# Patient Record
Sex: Male | Born: 1945 | Race: White | Hispanic: No | Smoking: Former smoker
Health system: Southern US, Community
[De-identification: ages and names within clinical notes are randomized; demographics above are authoritative.]

## PROBLEM LIST (undated history)

## (undated) DIAGNOSIS — R251 Tremor, unspecified: Secondary | ICD-10-CM

## (undated) DIAGNOSIS — C801 Malignant (primary) neoplasm, unspecified: Secondary | ICD-10-CM

## (undated) DIAGNOSIS — M1990 Unspecified osteoarthritis, unspecified site: Secondary | ICD-10-CM

## (undated) DIAGNOSIS — R0600 Dyspnea, unspecified: Secondary | ICD-10-CM

## (undated) DIAGNOSIS — I2510 Atherosclerotic heart disease of native coronary artery without angina pectoris: Secondary | ICD-10-CM

## (undated) DIAGNOSIS — I499 Cardiac arrhythmia, unspecified: Secondary | ICD-10-CM

## (undated) DIAGNOSIS — I10 Essential (primary) hypertension: Secondary | ICD-10-CM

## (undated) DIAGNOSIS — I779 Disorder of arteries and arterioles, unspecified: Secondary | ICD-10-CM

## (undated) DIAGNOSIS — I4891 Unspecified atrial fibrillation: Secondary | ICD-10-CM

## (undated) DIAGNOSIS — I209 Angina pectoris, unspecified: Secondary | ICD-10-CM

## (undated) DIAGNOSIS — N189 Chronic kidney disease, unspecified: Secondary | ICD-10-CM

## (undated) DIAGNOSIS — I491 Atrial premature depolarization: Secondary | ICD-10-CM

## (undated) DIAGNOSIS — I739 Peripheral vascular disease, unspecified: Secondary | ICD-10-CM

## (undated) DIAGNOSIS — Z950 Presence of cardiac pacemaker: Secondary | ICD-10-CM

## (undated) HISTORY — DX: Unspecified atrial fibrillation: I48.91

## (undated) HISTORY — PX: CATARACT EXTRACTION W/ INTRAOCULAR LENS IMPLANT: SHX1309

## (undated) HISTORY — DX: Disorder of arteries and arterioles, unspecified: I77.9

## (undated) HISTORY — DX: Atrial premature depolarization: I49.1

## (undated) HISTORY — DX: Essential (primary) hypertension: I10

## (undated) HISTORY — PX: BACK SURGERY: SHX140

## (undated) HISTORY — DX: Atherosclerotic heart disease of native coronary artery without angina pectoris: I25.10

## (undated) HISTORY — DX: Tremor, unspecified: R25.1

## (undated) HISTORY — DX: Peripheral vascular disease, unspecified: I73.9

## (undated) HISTORY — PX: ARM WOUND REPAIR / CLOSURE: SUR1141

---

## 2004-02-28 HISTORY — PX: CORONARY ARTERY BYPASS GRAFT: SHX141

## 2007-05-31 ENCOUNTER — Ambulatory Visit: Payer: Self-pay | Admitting: Cardiology

## 2007-07-23 ENCOUNTER — Ambulatory Visit: Payer: Self-pay | Admitting: Cardiology

## 2007-10-09 ENCOUNTER — Encounter: Payer: Self-pay | Admitting: Cardiology

## 2008-01-29 ENCOUNTER — Encounter: Payer: Self-pay | Admitting: Cardiology

## 2009-03-03 ENCOUNTER — Telehealth (INDEPENDENT_AMBULATORY_CARE_PROVIDER_SITE_OTHER): Payer: Self-pay | Admitting: *Deleted

## 2009-03-24 ENCOUNTER — Encounter: Payer: Self-pay | Admitting: Cardiology

## 2009-03-25 ENCOUNTER — Encounter: Payer: Self-pay | Admitting: Cardiology

## 2009-03-26 ENCOUNTER — Ambulatory Visit: Payer: Self-pay | Admitting: Cardiology

## 2010-03-29 NOTE — Progress Notes (Signed)
Summary: Request for lab order  Phone Note Call from Patient Call back at Home Phone [phone]   Caller: Spouse Call For: nurse Summary of Call: wife called requesting a lab order be sent for bloodwork prior to appt on 28th. Nurse called her back to inform her that there was no mention of labs in last note and if MD wanted labs/test that it would be ordered at the time of his visit but no answer and unable to leave message,phone just rang. Initial call taken by: Carlye Grippe,  March 03, 2009 12:02 PM  Follow-up for Phone Call        Patient informed of the above via messge machine after several attempts to reach patient.  Follow-up by: Carlye Grippe,  March 04, 2009 4:11 PM

## 2010-03-29 NOTE — Medication Information (Signed)
Summary: RX Folder/ LIPITOR  RX Folder/ LIPITOR   Imported By: Dorise Hiss 03/24/2009 10:58:05  _____________________________________________________________________  External Attachment:    Type:   Image     Comment:   External Document

## 2010-03-29 NOTE — Miscellaneous (Signed)
  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 [facility]... LIMA LAD, SVG to PDA, SVG to OM1, SVG to diagonal one CAD (ICD-414.00) HYPERTENSION (ICD-401.9) EF 50%... echo... 2006.[place] Atrial fibrillation.... post CABG.... amiodarone.... resolved  (03/25/2009 13:02)       Past History:  Past Medical History:  * TREMOR CORONARY ARTERY BYPASS GRAFT, HX OF (ICD-V45.81)...2006.... [facility]... LIMA LAD, SVG to PDA, SVG to OM1, SVG to diagonal one CAD (ICD-414.00) HYPERTENSION (ICD-401.9) EF 50%... echo... 2006.[place]land [place] Atrial fibrillation.... post CABG.... amiodarone.... resolved

## 2010-03-29 NOTE — Assessment & Plan Note (Signed)
Summary: EST-LAST SEEN 05/09 CALLED TO MAKE APPT   Visit Type:  Follow-up Primary Provider:  Dr. Lorelei Pont  CC:  CAD.  History of Present Illness: The patient is seen for followup of coronary artery disease.  He is also seen for PACs.  He seen also for hypertension.  I saw the patient last May, 2009. patient has a history of CABG at [facility] in 2006.  He has done well since then.  He is not having chest pain.  He is fully active without shortness of breath.  There has been recent question of "extra beats."  He does not feel palpitations.  Preventive Screening-Counseling & Management  Alcohol-Tobacco     Smoking Status: quit > 6 months  Comments: Smoked for about 30 yrs and quit about 25-30 yrs ago.  Chewed tobacco for about 6-7 yrs ago. Quit about 25-30 yrs ago.  Current Medications (verified): 1)  Celebrex 200 Mg Caps (Celecoxib) .... Take 1 Capsule By Mouth Once A Day 2)  Lipitor 40 Mg Tabs (Atorvastatin Calcium) .... Take 1 Tablet By Mouth At Bedtime 3)  Metoprolol Tartrate 25 Mg Tabs (Metoprolol Tartrate) .... Take 1/2 Tablet By Mouth Twice A Day 4)  Plavix 75 Mg Tabs (Clopidogrel Bisulfate) .... Take 1 Tablet By Mouth Once A Day 5)  Multivitamins  Tabs (Multiple Vitamin) .... Take 1 Tablet By Mouth Once A Day 6)  Fish Oil 1000 Mg Caps (Omega-3 Fatty Acids) .... Take 1 Capsule By Mouth Two Times A Day 7)  Flaxseed Oil 1200 Mg Caps (Flaxseed (Linseed)) .... Take 1 Capsule By Mouth Once A Day 8)  Niacin 500 Mg Tabs (Niacin) .... Take 1 Tablet By Mouth Two Times A Day  Allergies (verified): 1)  * Niaspan  Comments:  Nurse/Medical Assistant: The patient's medications were reviewed with the patient and were updated in the Medication List. Pt brought a list of medications to office visit.  Cyril Loosen, RN, BSN (March 26, 2009 9:12 AM)  Past History:  Past Medical History:  * TREMOR CORONARY ARTERY BYPASS GRAFT, HX OF (ICD-V45.81)...2006.... [facility]... LIMA LAD, SVG to PDA,  SVG to OM1, SVG to diagonal one CAD (ICD-414.00) HYPERTENSION (ICD-401.9) EF 50%... echo... 2006.[place] Atrial fibrillation.... post CABG.... amiodarone.... resolved PACs... EKG... January, 2011  Social History: Smoking Status:  quit > 6 months  Review of Systems       The patient denies fever, chills, rash, sweats, headache, nausea, vomiting, urinary symptoms, change in vision, change in hearing, chest pain, cough, shortness of breath.  All other systems are reviewed and are negative  Vital Signs:  Patient profile:   65 year old male Height:      71 inches Weight:      217.75 pounds BMI:     30.48 Pulse rate:   58 / minute BP sitting:   165 / 108  (left arm) Cuff size:   regular  Vitals Entered By: Cyril Loosen, RN, BSN (March 26, 2009 9:06 AM)  Nutrition Counseling: Patient's BMI is greater than 25 and therefore counseled on weight management options. CC: CAD Comments Follow up visit. No cardiac complaints.   Physical Exam  General:  patient is stable today. Head:  head is atraumatic. Eyes:  no xanthelasma. Neck:  no jugular venous distention. Chest Wall:  no chest wall tenderness. Lungs:  lungs are clear.  Respiratory effort is not labored. Heart:  cardiac exam reveals an S1 and S2.  There no clicks or significant murmurs. Abdomen:  abdomen is soft.  Msk:  no musculoskeletal deformities. Extremities:  no peripheral edema. Neurologic:  by history the patient has an intention tremor.  I have not seen it today.  There is no rest tremor. Skin:  no skin rashes. Psych:  patient is oriented to person time and place.  Affect is normal.  He is here with his wife today.   Impression & Recommendations:  Problem # 1:  * TREMOR Clinically the patient's tremor has not changed.  No further workup.  Problem # 2:  CAD (ICD-414.00)  His updated medication list for this problem includes:    Metoprolol Tartrate 25 Mg Tabs (Metoprolol tartrate) .Marland Kitchen... Take 1/2 tablet by  mouth twice a day    Plavix 75 Mg Tabs (Clopidogrel bisulfate) .Marland Kitchen... Take 1 tablet by mouth once a day  Orders: EKG w/ Interpretation (93000)  EKG is done today and reviewed by me.  The QRS is normal.  He has normal sinus rhythm.  There are PACs noted. The patient has been on aspirin and Plavix since the time of his surgery.  He no longer needs Plavix.  It will be stopped after his current prescription.  I will see him for cardiology followup in one year.  Problem # 3:  HYPERTENSION (ICD-401.9)  His updated medication list for this problem includes:    Metoprolol Tartrate 25 Mg Tabs (Metoprolol tartrate) .Marland Kitchen... Take 1/2 tablet by mouth twice a day  Blood pressure is elevated today.  I discussed this with the patient and his wife.  They will begin to use the home blood pressure cuff and record several values.  The patient will then see his primary physician in the next 2 weeks for comparison of home blood pressures and review of whether or not other additional meds are needed.  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 Take 1/2 tablet by mouth twice a day    Plavix 75 Mg Tabs (Clopidogrel bisulfate) .Marland Kitchen... Take 1 tablet by mouth once a day  there is question of "extra beats."  EKG reveals PACs.  These are benign.  No further workup is needed or in  Patient Instructions: 1)  Your physician has recommended you make the following change in your medication: STOP PLAVIX after finishing your current supply. 2)  Your physician recommends that you monitor your BP at home and see your primary care doctor R/E your BP.  3)  Your refills have been sent to [facility]. 4)  Your physician wants you to follow-up in: 1Year.  You will receive a reminder letter in the mail about two months in advance. If you don't receive a letter, please call our office to schedule the follow-up  appointment. Prescriptions: METOPROLOL TARTRATE 25 MG TABS (METOPROLOL TARTRATE) Take 1/2 tablet by mouth twice a day  #90 x 3   Entered by:   Carlye Grippe   Authorized by:   Talitha Givens, MD, [facility]   Signed by:   Carlye Grippe on 03/26/2009   Method used:   Electronically to        [facility] ([facility]order)       [address]       [place], [place]  [address]       Ph: [phone]       Fax: [phone]   RxID:   [id number] LIPITOR 40 MG TABS (ATORVASTATIN CALCIUM) Take 1 tablet by mouth at  bedtime  #90 x 3   Entered by:   Carlye Grippe   Authorized by:   Talitha Givens, MD, [facility]   Signed by:   Carlye Grippe on 03/26/2009   Method used:   Electronically to        [facility] (mail[facility]order)       [address]       [place], [place]  [address]       Ph: [phone]       Fax: [phone]   RxID:   [id number]

## 2010-05-02 ENCOUNTER — Ambulatory Visit (INDEPENDENT_AMBULATORY_CARE_PROVIDER_SITE_OTHER): Payer: PRIVATE HEALTH INSURANCE | Admitting: Cardiology

## 2010-05-02 ENCOUNTER — Encounter: Payer: Self-pay | Admitting: Cardiology

## 2010-05-02 DIAGNOSIS — I2510 Atherosclerotic heart disease of native coronary artery without angina pectoris: Secondary | ICD-10-CM

## 2010-05-10 NOTE — Assessment & Plan Note (Signed)
Summary: 1 YR F/U-RECV LETTER/VS/TR   Visit Type:  Follow-up Primary Provider:  Dr. Lorelei Pont  CC:  CAD.  History of Present Illness: patient is seen for followup of coronary artery disease.  I saw him last January, 2011.  He is quite active.  He is not having any significant chest pain or palpitations.  He underwent CABG in 2006.  He has not had any exercise testing.  He is very active and has no symptoms.  Preventive Screening-Counseling & Management  Alcohol-Tobacco     Smoking Status: quit     Year Quit: 2000  Allergies: 1)  * Niaspan  Past History:  Past Medical History:  * TREMOR CORONARY ARTERY BYPASS GRAFT, HX OF (ICD-V45.81)...2006.... [facility]... LIMA LAD, SVG to PDA, SVG to OM1, SVG to diagonal one CAD (ICD-414.00) HYPERTENSION (ICD-401.9) EF 50%... echo... 2006.[place] Atrial fibrillation.... post CABG.... amiodarone.... resolved PACs... EKG... January, 2011.Marland Kitchen  Social History: Smoking Status:  quit  Review of Systems       Patient denies fever, chills, headache, sweats, rash, change in vision, change in hearing, chest pain, cough, nausea vomiting, urinary symptoms.  All other systems are reviewed and are negative.  Vital Signs:  Patient profile:   65 year old male Height:      71 inches Weight:      215 pounds BMI:     30.09 Pulse rate:   74 / minute BP sitting:   148 / 85  (left arm) Cuff size:   large  Vitals Entered By: Carlye Grippe (May 02, 2010 12:59 PM)  Nutrition Counseling: Patient's BMI is greater than 25 and therefore counseled on weight management options.  Physical Exam  General:  patient is stable. Eyes:  no xanthelasma. Neck:  no jugular venous distention. Lungs:  lungs are clear.  Respiratory effort is not labored. Heart:  cardiac exam reveals S1 and S2.  No clicks or significant murmurs. Abdomen:  abdomen soft. Msk:  no musculoskeletal deformities. Extremities:  no peripheral edema. Psych:  patient is oriented to person  time and place.  Affect is normal.   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  He had atrial fibr at time of his CABG.  He has not had recurrence. EKG is done today and reviewed by me.  They're sinus rhythm.  No significant abnormality.  Problem # 2:  CAD (ICD-414.00) Coronary disease is stable.  No further workup is needed.  Problem # 3:  HYPERTENSION (ICD-401.9) Blood pressure is controlled.  No change in therapy.  Plan one year followup  Other Orders: EKG w/ Interpretation (93000)  Patient Instructions: 1)  Your physician wants you to follow-up in: 1 year. You will receive a reminder letter in the mail one-two months in advance. If you don't receive a letter, please call our office to schedule the follow-up appointment. 2)  Your physician recommends that you continue on your current medications as directed. Please refer to the Current Medication list given to you today. Prescriptions: METOPROLOL TARTRATE 25 MG TABS (METOPROLOL TARTRATE) Take 1/2 tablet by mouth twice a day  #90 x 3   Entered by:   Cyril Loosen, RN, BSN   Authorized by:   Talitha Givens, MD, [facility]   Signed by:   Cyril Loosen, RN, BSN on 05/02/2010   Method used:   Electronically to        [facility]* (mail-order)  [address]       [place], [place]  [address]       Ph: [phone]       Fax: [phone]   RxID:   [id number] LIPITOR 40 MG TABS (ATORVASTATIN CALCIUM) Take 1 tablet by mouth at bedtime  #90 x 3   Entered by:   Cyril Loosen, RN, BSN   Authorized by:   Talitha Givens, MD, [facility]   Signed by:   Cyril Loosen, RN, BSN on 05/02/2010   Method used:   Electronically to        [facility]* (mail-order)       [address]       [place], [place]  [address]       Ph: [phone]       Fax: [phone]   RxID:   [id number]

## 2010-07-12 NOTE — Assessment & Plan Note (Signed)
[facility]                          [facility]ICE NOTE   WACO, FOERSTER                      MRN:          [id number]  DATE:05/31/2007                            DOB:          10-29-1945    [name] is from [place], and needs an ongoing cardiologist,  and is here for cardiac evaluation.  He is here with his wife today.  He  is a very pleasant gentleman.  He is post CABG in 2006.  This was done  at [facility].  At that time, he underwent CABG x4 with a LIMA to the LAD, SVG  to the PDA, SVG to the OM1 and SVG to diagonal 1.  He has done well  since then.  He was seen in August 2006 in [place] by Dr. Graciela Hatfield.  There was a stress Cardiolite.  There was no ischemia.  Ejection  fraction was in the 60s.  The patient also had a 2-D echo.  Ejection  fraction was in the 50s.  There was question of some dilatation of the  right ventricle.  There was concentric LVH.   Overall he was stable at that time.  At this time he is not having any  significant chest pain.  He goes about full activities.  He is quite  active including riding his bicycle with his grandchildren.  He is doing  very well.  There is question that his blood pressure may be mildly  elevated.   PAST MEDICAL HISTORY:   ALLERGIES:  No known drug allergies.   MEDICATIONS:  1. Fish oil b.i.d.  2. Niacin 500 mg multivitamin.  3. Aspirin 325.  4. Plavix 75.  5. Lipitor 40.  6. Metoprolol 25.   At this point I do not have any documentation for the exact need for  ongoing Plavix and we will reconsider this.   SOCIAL HISTORY:  The patient lives in [place] and is active.  He  quit smoking years ago.   FAMILY HISTORY:  The family history is noncontributory at this time.   REVIEW OF SYSTEMS:  He has no significant complaints.  He has some  arthritis and otherwise his review of systems is negative.   PHYSICAL EXAM:  Blood pressure is 150/87.  Pulse of 76.  The  patient is oriented to person, time and place.  Affect is normal.  HEENT:  Reveals no xanthelasma.  He has normal extraocular motion.  There are no carotid bruits.  There is no jugular venous distention.  Lungs are clear.  Respiratory effort is not labored.  Cardiac exam reveals S1-S2.  There are no clicks or significant murmurs.  The abdomen is soft.  There are no masses or bruits.  He has no  significant peripheral edema.  There are no musculoskeletal deformities.  He has a lesion on the right side of his face status post a skin cancer  removed by Dr. Orvan Hatfield recently.   EKG shows no significant abnormalities.   PROBLEMS:  1. Hypertension.  2. Hypercholesterolemia.  3. History of coronary disease post CABG in 2006.  4. History of  normal LV function.  5. History of some type of tremor.  There is question of Parkinson's      disease but I believe that this is not documented.  He has seen a      neurologist at some point and was told it was some type of tremor.      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  6. Postoperative atrial fibrillation with amiodarone that was stopped      years ago.   The patient will have labs including CBC, TSH be met and a fasting lipid  with liver.  These of the only studies of I will range at this time.  I  will then see him back and I will consider other blood pressure meds.  Will also consider a longevity of his Plavix.  We will also consider  whether or not he needs any other testing.   He is quite stable.  If he finds a new primary physician here in town,  we will send all records.     William Abed, MD, [facility]  Electronically Signed    JDK/MedQ  DD: 05/31/2007  DT: 05/31/2007  Job #: [id number]

## 2010-07-12 NOTE — Assessment & Plan Note (Signed)
[facility]                          [facility] NOTE   William Hatfield, William Hatfield                      MRN:          [id number]  DATE:07/23/2007                            DOB:          [birth year]    [name] cardiac status is stable at this point.  See my note of May 31, 2007.  He established as a new patient.  He underwent cabbage in  2006.  He is not having any chest pain.  We had checked some labs.  His  HDL is quite low.  His niacin had been increased from 500 to 1 gram but  I now understand that this is an over-the-counter preparation and we  will change him to Niaspan.   He is not having any significant chest pain.  He is having significant  arthritic pain and he and his wife ask what medications he might be able  to use.   PAST MEDICAL HISTORY:   ALLERGIES:  No known drug allergies.   MEDICATIONS:  Fish oil, multivitamin, Plavix 75, Lipitor 40, metoprolol  25, aspirin 81, and over-the-counter niacin (to be changed to Niaspan).   OTHER MEDICAL PROBLEMS:  See the list on my note of May 31, 2007.   REVIEW OF SYSTEMS:  Other than his arthritis, his review of systems is  negative.   PHYSICAL EXAM:  Blood pressure is 150/87.  We will await look at his  blood pressure again when I see him back.  Weight is 215 pounds.  He is here with his wife.  HEENT:  Reveals no xanthelasma.  He has normal extraocular motion.  There are no carotid bruits.  There is no jugular venous distention.  Lungs are clear.  Respiratory effort is not labored.  Cardiac exam reveals an S1 with an S2.  There are no clicks or  significant murmurs.  The abdomen is soft.  He has no peripheral edema.   Problems are listed on the note of May 31, 2007.  The patient needs  continued followup of his blood pressure.  He may need further  medications.  We need to push Niaspan in an attempt to raise his HDL.  His LDL is good at 73.  He is stable.  We will adjust  these medicines.  I have encouraged him to use Celebrex at the 200 mg dose for his  arthritis.  This is, in my opinion, the  safest medicine that can be used at this time related to cardiac  disease.  I will see him in early followup.     Luis Abed, MD, [facility]  Electronically Signed    JDK/MedQ  DD: 07/23/2007  DT: 07/23/2007  Job #: [id number]   cc:   Dr. Leary Roca

## 2011-05-05 ENCOUNTER — Encounter: Payer: Self-pay | Admitting: *Deleted

## 2011-05-10 ENCOUNTER — Ambulatory Visit: Payer: PRIVATE HEALTH INSURANCE | Admitting: Cardiology

## 2011-05-18 ENCOUNTER — Ambulatory Visit: Payer: PRIVATE HEALTH INSURANCE | Admitting: Cardiology

## 2011-05-19 ENCOUNTER — Other Ambulatory Visit: Payer: Self-pay | Admitting: *Deleted

## 2011-05-19 MED ORDER — METOPROLOL TARTRATE 25 MG PO TABS: 12.5000 mg | ORAL_TABLET | Freq: Two times a day (BID) | ORAL | Status: DC

## 2011-06-16 ENCOUNTER — Ambulatory Visit: Payer: PRIVATE HEALTH INSURANCE | Admitting: Cardiology

## 2011-06-23 ENCOUNTER — Encounter: Payer: Self-pay | Admitting: Cardiology

## 2011-06-23 DIAGNOSIS — Z951 Presence of aortocoronary bypass graft: Secondary | ICD-10-CM | POA: Insufficient documentation

## 2011-06-23 DIAGNOSIS — R251 Tremor, unspecified: Secondary | ICD-10-CM | POA: Insufficient documentation

## 2011-06-23 DIAGNOSIS — I2510 Atherosclerotic heart disease of native coronary artery without angina pectoris: Secondary | ICD-10-CM | POA: Insufficient documentation

## 2011-06-23 DIAGNOSIS — I10 Essential (primary) hypertension: Secondary | ICD-10-CM | POA: Insufficient documentation

## 2011-06-23 DIAGNOSIS — IMO0002 Reserved for concepts with insufficient information to code with codable children: Secondary | ICD-10-CM | POA: Insufficient documentation

## 2011-06-23 DIAGNOSIS — I491 Atrial premature depolarization: Secondary | ICD-10-CM | POA: Insufficient documentation

## 2011-06-23 DIAGNOSIS — R9430 Abnormal result of cardiovascular function study, unspecified: Secondary | ICD-10-CM | POA: Insufficient documentation

## 2011-06-26 ENCOUNTER — Ambulatory Visit (INDEPENDENT_AMBULATORY_CARE_PROVIDER_SITE_OTHER): Payer: PRIVATE HEALTH INSURANCE | Admitting: Cardiology

## 2011-06-26 ENCOUNTER — Other Ambulatory Visit: Payer: Self-pay | Admitting: *Deleted

## 2011-06-26 ENCOUNTER — Encounter: Payer: Self-pay | Admitting: Cardiology

## 2011-06-26 VITALS — BP 151/76 | HR 62 | Ht 71.0 in | Wt 215.0 lb

## 2011-06-26 DIAGNOSIS — I10 Essential (primary) hypertension: Secondary | ICD-10-CM

## 2011-06-26 DIAGNOSIS — I4891 Unspecified atrial fibrillation: Secondary | ICD-10-CM

## 2011-06-26 DIAGNOSIS — I2510 Atherosclerotic heart disease of native coronary artery without angina pectoris: Secondary | ICD-10-CM

## 2011-06-26 DIAGNOSIS — R0989 Other specified symptoms and signs involving the circulatory and respiratory systems: Secondary | ICD-10-CM

## 2011-06-26 DIAGNOSIS — I491 Atrial premature depolarization: Secondary | ICD-10-CM

## 2011-06-26 MED ORDER — LISINOPRIL 40 MG PO TABS: 40.0000 mg | ORAL_TABLET | Freq: Every day | ORAL | Status: DC

## 2011-06-26 MED ORDER — METOPROLOL TARTRATE 25 MG PO TABS: 12.5000 mg | ORAL_TABLET | Freq: Two times a day (BID) | ORAL | Status: DC

## 2011-06-26 MED ORDER — ATORVASTATIN CALCIUM 40 MG PO TABS: 40.0000 mg | ORAL_TABLET | Freq: Every day | ORAL | Status: DC

## 2011-06-26 NOTE — Assessment & Plan Note (Signed)
His systolic blood pressure is mildly increased today. At home he says that his blood pressure tends to run in the high 140s. He is on blood pressure medications. I suspect we really should an additional medication. He will be seeing his primary physician in another week or 2. I will send a copy of my current note to him. At that time they can decide if another medication should be adequate for his blood pressure.

## 2011-06-26 NOTE — Assessment & Plan Note (Signed)
He is not having any significant palpitations. No further workup. 

## 2011-06-26 NOTE — Assessment & Plan Note (Signed)
Coronary disease is stable. He underwent CABG in 2006. He is very active and has absolutely no symptoms. He does note he has not had any exercise test in many years I feel it is not necessary at this time.

## 2011-06-26 NOTE — Progress Notes (Signed)
HPI Patient returns for followup of coronary artery disease. He is doing very well. He underwent CABG in 2006. He is very active. He mows and works in his garden and has absolutely no symptoms. He is on appropriate medications. I saw him last one year ago.  As part of today's evaluation I have completely reviewed the old records. I have updated the current new electronic medical record.   Allergies  Allergen Reactions  . Niaspan (Niacin) Hives    NIASPAN ONLY/REACTION: Itching, whelps    Current Outpatient Prescriptions  Medication Sig Dispense Refill  . aspirin EC 81 MG tablet Take 162 mg by mouth daily.      Marland Kitchen atorvastatin (LIPITOR) 40 MG tablet Take 40 mg by mouth daily.      . Cinnamon 500 MG TABS Take 1 tablet by mouth daily.      . fish oil-omega-3 fatty acids 1000 MG capsule Take 1 g by mouth daily.       . Flaxseed, Linseed, (FLAXSEED OIL) 1200 MG CAPS Take 1 capsule by mouth daily.      Marland Kitchen lisinopril (PRINIVIL,ZESTRIL) 40 MG tablet Take 40 mg by mouth daily.      . metoprolol tartrate (LOPRESSOR) 25 MG tablet Take 0.5 tablets (12.5 mg total) by mouth 2 (two) times daily.  90 tablet  0  . Multiple Vitamin (MULTIVITAMIN) tablet Take 1 tablet by mouth daily.      . niacin 500 MG tablet Take 500 mg by mouth daily with breakfast.       . vitamin C (ASCORBIC ACID) 500 MG tablet Take 500 mg by mouth daily.        History   Social History  . Marital Status: Married    Spouse Name: N/A    Number of Children: N/A  . Years of Education: N/A   Occupational History  . Not on file.   Social History Main Topics  . Smoking status: Former Smoker -- 4.0 packs/day for 20 years    Types: Cigarettes    Quit date: 02/28/1979  . Smokeless tobacco: Former Neurosurgeon    Types: Snuff, Chew    Quit date: 02/27/1981  . Alcohol Use: Not on file  . Drug Use: Not on file  . Sexually Active: Not on file   Other Topics Concern  . Not on file   Social History Narrative  . No narrative on file     No family history on file.  Past Medical History  Diagnosis Date  . Tremor   . Hypertension   . Atrial fibrillation     post CABG.... amiodarone.... resolved  . PAC (premature atrial contraction)     PACs... EKG... January, 2011..  . CAD (coronary artery disease)   . Hx of CABG     2006, [facility], LIMA to LAD, SVG to PDA, SVG to OM1, SVG to diagonal 1  . Ejection fraction     EF 50%, echo, 2006    Past Surgical History  Procedure Date  . Coronary artery bypass graft 2006    [facility]    ROS  Patient denies fever, chills, headache, sweats, rash, change in vision, change in hearing, chest pain, cough, nausea vomiting, urinary symptoms. All other systems are reviewed and are negative.  PHYSICAL EXAM  Patient is here with his wife. He is oriented to person time and place. Affect is normal. There is no jugulovenous distention. There may be a soft left carotid bruit. Lungs are clear. Respiratory effort is  nonlabored. Cardiac exam reveals S1 and S2. There no clicks or significant murmurs. The abdomen is soft. There is no peripheral edema. There no musculoskeletal deformities. There are no skin rashes.  Filed Vitals:   06/26/11 1318  BP: 151/76  Pulse: 62  Height: 5\' 11"  (1.803 m)  Weight: 215 lb (97.523 kg)   EKG is done today and reviewed by me. There is no significant abnormality.  ASSESSMENT & PLAN

## 2011-06-26 NOTE — Patient Instructions (Signed)
Your physician you to follow up in 1 year. You will receive a reminder letter in the mail one-two months in advance. If you don't receive a letter, please call our office to schedule the follow-up appointment. Your physician recommends that you continue on your current medications as directed. Please refer to the Current Medication list given to you today. Your physician has requested that you have a carotid duplex. This test is an ultrasound of the carotid arteries in your neck. It looks at blood flow through these arteries that supply the brain with blood. Allow one hour for this exam. There are no restrictions or special instructions. If the results of your test are normal or stable, you will receive a letter. If they are abnormal, the nurse will contact you by phone.  

## 2011-06-26 NOTE — Assessment & Plan Note (Signed)
There is question of a soft left carotid bruit. The patient has not had a carotid Doppler since his surgery in 2006. We will schedule him for a carotid Doppler. Overall I will plan to see him back in one year.

## 2011-07-12 ENCOUNTER — Encounter (INDEPENDENT_AMBULATORY_CARE_PROVIDER_SITE_OTHER): Payer: PRIVATE HEALTH INSURANCE

## 2011-07-12 DIAGNOSIS — I6529 Occlusion and stenosis of unspecified carotid artery: Secondary | ICD-10-CM

## 2011-07-12 DIAGNOSIS — R0989 Other specified symptoms and signs involving the circulatory and respiratory systems: Secondary | ICD-10-CM

## 2011-07-18 ENCOUNTER — Encounter: Payer: Self-pay | Admitting: Cardiology

## 2011-07-18 DIAGNOSIS — I779 Disorder of arteries and arterioles, unspecified: Secondary | ICD-10-CM | POA: Insufficient documentation

## 2011-07-18 DIAGNOSIS — I739 Peripheral vascular disease, unspecified: Secondary | ICD-10-CM

## 2011-07-19 ENCOUNTER — Telehealth: Payer: Self-pay | Admitting: *Deleted

## 2011-07-19 NOTE — Telephone Encounter (Signed)
Left message with patients wife to call office. 

## 2011-07-19 NOTE — Telephone Encounter (Signed)
Message copied by Eustace Moore on Wed Jul 19, 2011 10:41 AM ------      Message from: [place], [place] D      Created: Tue Jul 18, 2011  1:42 PM       Please call to be sure the patient knows that the Doppler does show moderate narrowing in his carotid arteries. I have explained to him that we wanted to do the study to be sure that we were following any narrowing that he has. It will be safe to follow as long as he comes in for the followup Dopplers.

## 2011-07-19 NOTE — Telephone Encounter (Signed)
Message copied by Eustace Moore on Wed Jul 19, 2011 10:48 AM ------      Message from: [place], [place] D      Created: Tue Jul 18, 2011  1:42 PM       Please call to be sure the patient knows that the Doppler does show moderate narrowing in his carotid arteries. I have explained to him that we wanted to do the study to be sure that we were following any narrowing that he has. It will be safe to follow as long as he comes in for the followup Dopplers.

## 2011-07-19 NOTE — Telephone Encounter (Signed)
Patient informed. 

## 2012-02-22 ENCOUNTER — Encounter (INDEPENDENT_AMBULATORY_CARE_PROVIDER_SITE_OTHER): Payer: PRIVATE HEALTH INSURANCE

## 2012-02-22 DIAGNOSIS — I6529 Occlusion and stenosis of unspecified carotid artery: Secondary | ICD-10-CM

## 2012-03-03 ENCOUNTER — Encounter: Payer: Self-pay | Admitting: Cardiology

## 2012-03-05 ENCOUNTER — Telehealth: Payer: Self-pay | Admitting: *Deleted

## 2012-03-05 NOTE — Telephone Encounter (Signed)
Wife informed

## 2012-03-05 NOTE — Telephone Encounter (Signed)
Message copied by Eustace Moore on Tue Mar 05, 2012  1:24 PM ------      Message from: [place], [place] D      Created: Sun Mar 03, 2012  6:32 PM       Please let him know that his carotid Doppler is stable. Continued close followup is important

## 2012-04-30 ENCOUNTER — Other Ambulatory Visit: Payer: Self-pay | Admitting: Cardiology

## 2012-04-30 MED ORDER — LISINOPRIL 40 MG PO TABS: 40.0000 mg | ORAL_TABLET | Freq: Every day | ORAL | Status: DC

## 2012-05-13 ENCOUNTER — Other Ambulatory Visit: Payer: Self-pay | Admitting: Cardiology

## 2012-05-13 MED ORDER — METOPROLOL TARTRATE 25 MG PO TABS: 12.5000 mg | ORAL_TABLET | Freq: Two times a day (BID) | ORAL | Status: DC

## 2012-06-19 ENCOUNTER — Ambulatory Visit (INDEPENDENT_AMBULATORY_CARE_PROVIDER_SITE_OTHER): Payer: PRIVATE HEALTH INSURANCE | Admitting: Cardiology

## 2012-06-19 ENCOUNTER — Encounter: Payer: Self-pay | Admitting: Cardiology

## 2012-06-19 VITALS — BP 133/72 | HR 56 | Ht 71.0 in | Wt 213.0 lb

## 2012-06-19 DIAGNOSIS — I491 Atrial premature depolarization: Secondary | ICD-10-CM

## 2012-06-19 DIAGNOSIS — I779 Disorder of arteries and arterioles, unspecified: Secondary | ICD-10-CM

## 2012-06-19 DIAGNOSIS — R0989 Other specified symptoms and signs involving the circulatory and respiratory systems: Secondary | ICD-10-CM

## 2012-06-19 DIAGNOSIS — I480 Paroxysmal atrial fibrillation: Secondary | ICD-10-CM | POA: Insufficient documentation

## 2012-06-19 DIAGNOSIS — I10 Essential (primary) hypertension: Secondary | ICD-10-CM

## 2012-06-19 DIAGNOSIS — IMO0002 Reserved for concepts with insufficient information to code with codable children: Secondary | ICD-10-CM

## 2012-06-19 DIAGNOSIS — I2510 Atherosclerotic heart disease of native coronary artery without angina pectoris: Secondary | ICD-10-CM

## 2012-06-19 DIAGNOSIS — R9430 Abnormal result of cardiovascular function study, unspecified: Secondary | ICD-10-CM

## 2012-06-19 DIAGNOSIS — I4891 Unspecified atrial fibrillation: Secondary | ICD-10-CM

## 2012-06-19 MED ORDER — CHLORTHALIDONE 25 MG PO TABS: 25.0000 mg | ORAL_TABLET | Freq: Every day | ORAL | Status: DC

## 2012-06-19 MED ORDER — ATORVASTATIN CALCIUM 40 MG PO TABS: 40.0000 mg | ORAL_TABLET | Freq: Every day | ORAL | Status: DC

## 2012-06-19 MED ORDER — METOPROLOL TARTRATE 25 MG PO TABS: 12.5000 mg | ORAL_TABLET | Freq: Two times a day (BID) | ORAL | Status: DC

## 2012-06-19 MED ORDER — LISINOPRIL 40 MG PO TABS: 40.0000 mg | ORAL_TABLET | Freq: Every day | ORAL | Status: DC

## 2012-06-19 NOTE — Assessment & Plan Note (Signed)
The patient had an episode of atrial fib in the past around the time of his CABG. He's had no recurrent symptoms since then. No further workup.

## 2012-06-19 NOTE — Assessment & Plan Note (Signed)
Historically his ejection fraction was 50% in 2006. It is time to reassess this and a echo will be scheduled.

## 2012-06-19 NOTE — Assessment & Plan Note (Addendum)
Blood pressures control. No change in therapy.  As part of today's evaluation I spent greater than 25 minutes total with his overall care. More than half of this time was spent with direct contact with him and his wife talking about all of the issues and examining him.

## 2012-06-19 NOTE — Patient Instructions (Addendum)

## 2012-06-19 NOTE — Progress Notes (Signed)
HPI  Patient is seen today to followup coronary artery disease. I saw him last April, 2013. I was concerned about carotid bruits. We arrange for followup Doppler and he had had progression of his carotid disease. This was followed up again in December, 2013 and is being followed very carefully. He and his wife both understand this process. The patient underwent CABG in 2006. His symptoms at that time or shortness of breath and chest tightness. He has had no return of symptoms. He is quite active mowing his yard and working in his garden. He has absolutely no symptoms.  Allergies  Allergen Reactions  . Niaspan (Niacin) Hives    NIASPAN ONLY/REACTION: Itching, whelps    Current Outpatient Prescriptions  Medication Sig Dispense Refill  . aspirin EC 81 MG tablet Take 81 mg by mouth daily.       Marland Kitchen atorvastatin (LIPITOR) 40 MG tablet Take 1 tablet (40 mg total) by mouth daily.  90 tablet  3  . chlorthalidone (HYGROTON) 25 MG tablet Take 1 tablet (25 mg total) by mouth daily.  90 tablet  3  . Cinnamon 500 MG TABS Take 2 tablets by mouth daily.       . fish oil-omega-3 fatty acids 1000 MG capsule Take 1 g by mouth daily.       . Flaxseed, Linseed, (FLAXSEED OIL) 1200 MG CAPS Take 1 capsule by mouth daily.      Marland Kitchen lisinopril (PRINIVIL,ZESTRIL) 40 MG tablet Take 1 tablet (40 mg total) by mouth daily.  90 tablet  3  . metoprolol tartrate (LOPRESSOR) 25 MG tablet Take 0.5 tablets (12.5 mg total) by mouth 2 (two) times daily.  90 tablet  3  . Multiple Vitamin (MULTIVITAMIN) tablet Take 1 tablet by mouth daily.      . nabumetone (RELAFEN) 750 MG tablet Take 750 mg by mouth 2 (two) times daily.      . niacin 500 MG tablet Take 500 mg by mouth daily with breakfast.       . vitamin C (ASCORBIC ACID) 500 MG tablet Take 500 mg by mouth as needed.        No current facility-administered medications for this visit.    History   Social History  . Marital Status: Married    Spouse Name: N/A    Number of  Children: N/A  . Years of Education: N/A   Occupational History  . Not on file.   Social History Main Topics  . Smoking status: Former Smoker -- 4.00 packs/day for 20 years    Types: Cigarettes    Quit date: 02/28/1979  . Smokeless tobacco: Former Neurosurgeon    Types: Snuff, Chew    Quit date: 02/27/1981  . Alcohol Use: Not on file  . Drug Use: Not on file  . Sexually Active: Not on file   Other Topics Concern  . Not on file   Social History Narrative  . No narrative on file    No family history on file.  Past Medical History  Diagnosis Date  . Tremor   . Hypertension   . Atrial fibrillation     post CABG.... amiodarone.... resolved  . PAC (premature atrial contraction)     PACs... EKG... January, 2011..  . CAD (coronary artery disease)   . Hx of CABG     2006, [facility], LIMA to LAD, SVG to PDA, SVG to OM1, SVG to diagonal 1  . Ejection fraction     EF 50%, echo, 2006  .  Carotid artery disease     Doppler, May, 2013, 60-79% bilateral, plan followup 6 months    Past Surgical History  Procedure Laterality Date  . Coronary artery bypass graft  2006    [facility]    Patient Active Problem List  Diagnosis  . Tremor  . Hypertension  . Campath-induced atrial fibrillation  . PAC (premature atrial contraction)  . CAD (coronary artery disease)  . Hx of CABG  . Ejection fraction  . Carotid artery disease    ROS   Patient denies fever, chills, headache, sweats, rash, change in vision, change in hearing, chest pain, cough, nausea vomiting, urinary symptoms. All other systems are reviewed and are negative.  PHYSICAL EXAM  Patient is here with his wife today. He is quite stable. He is oriented to person time and place. Affect is normal. Lungs are clear. Respiratory effort is nonlabored. Cardiac exam reveals S1 and S2. There no clicks or significant murmurs. The abdomen is soft. There is no peripheral edema. There are no musculoskeletal deformities. There are no skin rashes.  Filed  Vitals:   06/19/12 1024  BP: 133/72  Pulse: 56  Height: 5\' 11"  (1.803 m)  Weight: 213 lb (96.616 kg)   EKG is done today and reviewed by me. There is normal sinus rhythm. EKG is normal. There is no change from the past.  ASSESSMENT & PLAN

## 2012-06-19 NOTE — Assessment & Plan Note (Signed)
I had a full discussion with the patient and his wife about the approach to coronary disease over time. He is on appropriate medications. He has not had a stress test. His CABG was 2006. Guidelines might suggest that he have a stress test at this time. I feel that he has a stress test every time he does is heavy work with no symptoms. I've chosen not to proceed with a stress test at this time. However I feel we should do a 2-D echo to reassess left ventricular function to be sure that he's not had any unexpected changes. This will be scheduled and will be in touch with him with the information.

## 2012-06-19 NOTE — Assessment & Plan Note (Signed)
He does have significant parotid disease. This is being followed very carefully. He and his wife and I spoke about this at length.

## 2012-06-20 ENCOUNTER — Other Ambulatory Visit: Payer: Self-pay | Admitting: Cardiology

## 2012-06-20 MED ORDER — ATORVASTATIN CALCIUM 40 MG PO TABS: 40.0000 mg | ORAL_TABLET | Freq: Every day | ORAL | Status: DC

## 2012-06-20 NOTE — Telephone Encounter (Signed)
Opened in error

## 2012-07-10 ENCOUNTER — Other Ambulatory Visit: Payer: Self-pay

## 2012-07-10 ENCOUNTER — Other Ambulatory Visit (INDEPENDENT_AMBULATORY_CARE_PROVIDER_SITE_OTHER): Payer: PRIVATE HEALTH INSURANCE

## 2012-07-10 DIAGNOSIS — I2510 Atherosclerotic heart disease of native coronary artery without angina pectoris: Secondary | ICD-10-CM

## 2012-07-10 DIAGNOSIS — I4891 Unspecified atrial fibrillation: Secondary | ICD-10-CM

## 2012-07-10 DIAGNOSIS — R0989 Other specified symptoms and signs involving the circulatory and respiratory systems: Secondary | ICD-10-CM

## 2012-07-11 ENCOUNTER — Telehealth: Payer: Self-pay | Admitting: *Deleted

## 2012-07-11 NOTE — Telephone Encounter (Signed)
Message copied by Eustace Moore on Thu Jul 11, 2012 11:31 AM ------      Message from: Myrtis Ser, [place] D      Created: Thu Jul 11, 2012 11:22 AM       Please let the patient know that his echo looks good ------

## 2012-07-11 NOTE — Telephone Encounter (Signed)
Patient's wife informed

## 2012-07-12 ENCOUNTER — Other Ambulatory Visit: Payer: Self-pay | Admitting: *Deleted

## 2012-07-12 MED ORDER — METOPROLOL TARTRATE 25 MG PO TABS: 12.5000 mg | ORAL_TABLET | Freq: Two times a day (BID) | ORAL | Status: DC

## 2012-07-12 MED ORDER — LISINOPRIL 40 MG PO TABS: 40.0000 mg | ORAL_TABLET | Freq: Every day | ORAL | Status: DC

## 2012-07-12 MED ORDER — ATORVASTATIN CALCIUM 40 MG PO TABS: 40.0000 mg | ORAL_TABLET | Freq: Every evening | ORAL | Status: DC

## 2012-08-21 ENCOUNTER — Other Ambulatory Visit: Payer: Self-pay | Admitting: *Deleted

## 2012-09-11 ENCOUNTER — Encounter (INDEPENDENT_AMBULATORY_CARE_PROVIDER_SITE_OTHER): Payer: PRIVATE HEALTH INSURANCE

## 2012-09-11 DIAGNOSIS — I6529 Occlusion and stenosis of unspecified carotid artery: Secondary | ICD-10-CM

## 2012-09-17 ENCOUNTER — Encounter: Payer: Self-pay | Admitting: Cardiology

## 2013-04-01 ENCOUNTER — Other Ambulatory Visit: Payer: Self-pay | Admitting: *Deleted

## 2013-04-01 MED ORDER — METOPROLOL TARTRATE 25 MG PO TABS: 12.5000 mg | ORAL_TABLET | Freq: Two times a day (BID) | ORAL | Status: DC

## 2013-04-01 MED ORDER — ATORVASTATIN CALCIUM 40 MG PO TABS: 40.0000 mg | ORAL_TABLET | Freq: Every evening | ORAL | Status: DC

## 2013-04-01 MED ORDER — LISINOPRIL 40 MG PO TABS: 40.0000 mg | ORAL_TABLET | Freq: Every day | ORAL | Status: DC

## 2013-05-26 ENCOUNTER — Other Ambulatory Visit: Payer: Self-pay | Admitting: Cardiology

## 2013-07-14 ENCOUNTER — Other Ambulatory Visit: Payer: Self-pay | Admitting: Cardiology

## 2013-07-31 ENCOUNTER — Encounter: Payer: Self-pay | Admitting: Cardiology

## 2013-08-01 ENCOUNTER — Ambulatory Visit (INDEPENDENT_AMBULATORY_CARE_PROVIDER_SITE_OTHER): Payer: Medicare Other | Admitting: Cardiology

## 2013-08-01 ENCOUNTER — Encounter: Payer: Self-pay | Admitting: Cardiology

## 2013-08-01 VITALS — BP 114/75 | HR 76 | Ht 71.0 in | Wt 211.0 lb

## 2013-08-01 DIAGNOSIS — I2510 Atherosclerotic heart disease of native coronary artery without angina pectoris: Secondary | ICD-10-CM

## 2013-08-01 DIAGNOSIS — I779 Disorder of arteries and arterioles, unspecified: Secondary | ICD-10-CM

## 2013-08-01 DIAGNOSIS — I10 Essential (primary) hypertension: Secondary | ICD-10-CM

## 2013-08-01 DIAGNOSIS — E785 Hyperlipidemia, unspecified: Secondary | ICD-10-CM | POA: Insufficient documentation

## 2013-08-01 DIAGNOSIS — I739 Peripheral vascular disease, unspecified: Secondary | ICD-10-CM

## 2013-08-01 MED ORDER — CHLORTHALIDONE 25 MG PO TABS: 25.0000 mg | ORAL_TABLET | Freq: Every day | ORAL | Status: DC

## 2013-08-01 MED ORDER — METOPROLOL TARTRATE 25 MG PO TABS: 12.5000 mg | ORAL_TABLET | Freq: Two times a day (BID) | ORAL | Status: DC

## 2013-08-01 MED ORDER — LISINOPRIL 40 MG PO TABS: 40.0000 mg | ORAL_TABLET | Freq: Every day | ORAL | Status: DC

## 2013-08-01 NOTE — Assessment & Plan Note (Signed)
Coronary disease is stable. 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

## 2013-08-01 NOTE — Patient Instructions (Signed)
Your physician has requested that you have a carotid duplex. This test is an ultrasound of the carotid arteries in your neck. It looks at blood flow through these arteries that supply the brain with blood. Allow one hour for this exam. There are no restrictions or special instructions. Office will contact with results via phone or letter.   Stop Niacin Continue all other medications.   Your physician wants you to follow up in:  1 year.  You will receive a reminder letter in the mail one-two months in advance.  If you don't receive a letter, please call our office to schedule the follow up appointment

## 2013-08-01 NOTE — Assessment & Plan Note (Signed)
Patient is on guideline directed therapy with atorvastatin 40 mg daily. In the past he receive niacin. This was probably given for low HDL. Currently he takes an over-the-counter preparation of 500 mg daily. Most recent data shows that there is no definite evidence that this makes a difference. I have given him an option to stop this.

## 2013-08-01 NOTE — Assessment & Plan Note (Signed)
Blood pressure is controlled. No change in therapy. 

## 2013-08-01 NOTE — Progress Notes (Signed)
Patient ID: William RamsRobert Lee Hatfield, male   DOB: 07/26/1945, 68 y.o.   MRN: [id number]    HPI  Patient is seen today to followup coronary disease. He has been stable. He underwent CABG in 2006. He mows his yard and works without difficulties. 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 This is significant workload. He feels fine with this. He has not had regular exercise testing. 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 I told him that I was comfortable with his proceeding with this. I asked that a copy be sent to us. He does have carotid disease. His last Doppler was July, 2014. It is time for followup now.  Allergies  Allergen Reactions  . Niaspan [Niacin] Hives    NIASPAN ONLY/REACTION: Itching, whelps    Current Outpatient Prescriptions  Medication Sig Dispense Refill  . aspirin EC 81 MG tablet Take 81 mg by mouth daily.       Marland Kitchen. atorvastatin (LIPITOR) 40 MG tablet TAKE 1 TABLET EVERY EVENING  90 tablet  3  . chlorthalidone (HYGROTON) 25 MG tablet Take 1 tablet (25 mg total) by mouth daily.  90 tablet  3  . Cinnamon 500 MG TABS Take 2 tablets by mouth daily.       Marland Kitchen. lisinopril (PRINIVIL,ZESTRIL) 40 MG tablet TAKE 1 TABLET EVERY DAY  90 tablet  3  . metoprolol tartrate (LOPRESSOR) 25 MG tablet TAKE 1/2 TABLET TWICE DAILY  90 tablet  0  . Multiple Vitamin (MULTIVITAMIN) tablet Take 1 tablet by mouth daily.      . niacin 500 MG tablet Take 500 mg by mouth daily with breakfast.        No current facility-administered medications for this visit.    History   Social History  . Marital Status: Married    Spouse Name: N/A    Number of Children: N/A  . Years of Education: N/A   Occupational History  . Not on file.   Social History Main Topics  . Smoking status: Former Smoker -- 4.00 packs/day for 20 years    Types: Cigarettes    Quit date: 02/28/1979  . Smokeless tobacco: Former NeurosurgeonUser    Types: Snuff, Chew   Quit date: 02/27/1981  . Alcohol Use: Not on file  . Drug Use: Not on file  . Sexual Activity: Not on file   Other Topics Concern  . Not on file   Social History Narrative  . No narrative on file    No family history on file.  Past Medical History  Diagnosis Date  . Tremor   . Hypertension   . Atrial fibrillation     post CABG.... amiodarone.... resolved  . PAC (premature atrial contraction)     PACs... EKG... January, 2011..  . CAD (coronary artery disease)   . Hx of CABG     2006, [facility], LIMA to LAD, SVG to PDA, SVG to OM1, SVG to diagonal 1  . Ejection fraction     EF 50%, echo, 2006  . Carotid artery disease     Doppler, May, 2013, 60-79% bilateral, plan followup 6 months    Past Surgical History  Procedure Laterality Date  . Coronary artery bypass graft  2006    [facility]    Patient Active Problem List   Diagnosis Date Noted  . Atrial fibrillation 06/19/2012  . Carotid artery disease   . Tremor   .  Hypertension   . PAC (premature atrial contraction)   . CAD (coronary artery disease)   . Hx of CABG   . Ejection fraction     ROS   Patient denies fever, chills, headache, sweats, rash, change in vision, change in hearing, chest pain, cough, nausea or vomiting, urinary symptoms. All other systems are reviewed and are negative.  PHYSICAL EXAM  Patient is oriented to person time and place. Affect is normal. Head is atraumatic. Sclera and conjunctiva are normal. There is no jugulovenous distention. Lungs are clear. Respiratory effort is nonlabored. Cardiac exam reveals S1 and S2. The rhythm is regular. Abdomen is soft. There is no peripheral edema. There no musculoskeletal deformities. There are no skin rashes.  Filed Vitals:   08/01/13 1421  BP: 114/75  Pulse: 76  Height: 5\' 11"  (1.803 m)  Weight: 211 lb (95.709 kg)   EKG is done today and reviewed by me. There is no significant change.  ASSESSMENT & PLAN

## 2013-08-01 NOTE — Assessment & Plan Note (Signed)
It is time for carotid Doppler followup. There was to have been a six-month followup after July, 2014. This was not done.

## 2013-08-07 ENCOUNTER — Encounter (INDEPENDENT_AMBULATORY_CARE_PROVIDER_SITE_OTHER): Payer: Medicare Other

## 2013-08-07 DIAGNOSIS — I6529 Occlusion and stenosis of unspecified carotid artery: Secondary | ICD-10-CM

## 2013-08-07 DIAGNOSIS — I739 Peripheral vascular disease, unspecified: Principal | ICD-10-CM

## 2013-08-07 DIAGNOSIS — I779 Disorder of arteries and arterioles, unspecified: Secondary | ICD-10-CM

## 2013-08-11 ENCOUNTER — Encounter: Payer: Self-pay | Admitting: Cardiology

## 2013-08-12 ENCOUNTER — Telehealth: Payer: Self-pay | Admitting: *Deleted

## 2013-08-12 NOTE — Telephone Encounter (Signed)
Wife informed

## 2013-08-12 NOTE — Telephone Encounter (Signed)
Message copied by Eustace Moore on Tue Aug 12, 2013  4:13 PM ------      Message from: Myrtis Ser, [place] D      Created: Mon Aug 11, 2013 10:23 AM       Please let the patient know that the carotid Doppler was very stable. Therefore the next plan followup can be one year. ------

## 2014-08-13 ENCOUNTER — Encounter: Payer: Self-pay | Admitting: Cardiology

## 2014-08-13 ENCOUNTER — Ambulatory Visit (INDEPENDENT_AMBULATORY_CARE_PROVIDER_SITE_OTHER): Payer: Medicare Other | Admitting: Cardiology

## 2014-08-13 VITALS — BP 115/72 | HR 72 | Ht 71.0 in | Wt 211.8 lb

## 2014-08-13 DIAGNOSIS — I4891 Unspecified atrial fibrillation: Secondary | ICD-10-CM | POA: Diagnosis not present

## 2014-08-13 DIAGNOSIS — I25810 Atherosclerosis of coronary artery bypass graft(s) without angina pectoris: Secondary | ICD-10-CM | POA: Diagnosis not present

## 2014-08-13 DIAGNOSIS — I779 Disorder of arteries and arterioles, unspecified: Secondary | ICD-10-CM | POA: Diagnosis not present

## 2014-08-13 DIAGNOSIS — R0989 Other specified symptoms and signs involving the circulatory and respiratory systems: Secondary | ICD-10-CM | POA: Diagnosis not present

## 2014-08-13 DIAGNOSIS — R9430 Abnormal result of cardiovascular function study, unspecified: Secondary | ICD-10-CM

## 2014-08-13 DIAGNOSIS — I739 Peripheral vascular disease, unspecified: Secondary | ICD-10-CM

## 2014-08-13 MED ORDER — CHLORTHALIDONE 25 MG PO TABS: 25.0000 mg | ORAL_TABLET | Freq: Every day | ORAL | Status: DC

## 2014-08-13 MED ORDER — LISINOPRIL 40 MG PO TABS: 40.0000 mg | ORAL_TABLET | Freq: Every day | ORAL | Status: DC

## 2014-08-13 MED ORDER — METOPROLOL TARTRATE 25 MG PO TABS: 12.5000 mg | ORAL_TABLET | Freq: Two times a day (BID) | ORAL | Status: DC

## 2014-08-13 MED ORDER — ATORVASTATIN CALCIUM 40 MG PO TABS: 40.0000 mg | ORAL_TABLET | Freq: Every evening | ORAL | Status: DC

## 2014-08-13 NOTE — Assessment & Plan Note (Signed)
Patient had limited atrial fibrillation after the time of his bypass surgery. He was on amiodarone for a brief period of time and it was stopped. He's had no recurrence. There is no need for anticoagulation.

## 2014-08-13 NOTE — Assessment & Plan Note (Signed)
The patient's last Doppler was June, 2015. He has 60-79% bilateral disease that has been very stable over the years. This is why he can be studied once a year. We are now scheduling this years Doppler.

## 2014-08-13 NOTE — Assessment & Plan Note (Signed)
His coronary disease is stable since his CABG in 2006. He does his own exercise testing by mowing his yard. He has no symptoms. I've chosen not to proceed with exercise testing. He is on appropriate medications.

## 2014-08-13 NOTE — Progress Notes (Signed)
Cardiology Office Note   Date:  08/13/2014   ID:  William Hatfield, [place] 12-14-1945, MRN [id number]  PCP:  Lorelei Pont DO  Cardiologist:  Willa Rough, MD   Chief Complaint  Patient presents with  . Appointment    Follow-up coronary artery disease      History of Present Illness: William Hatfield is a 69 y.o. male who presents to follow up coronary artery disease. I saw him last June, 2015. He underwent surgery in 2006. He has done well since then. He continues to work including mowing his yard. He has no symptoms. He is on appropriate medications. His carotid Dopplers have been followed carefully. It is now time for her yearly follow-up.    Past Medical History  Diagnosis Date  . Tremor   . Hypertension   . Atrial fibrillation     post CABG.... amiodarone.... resolved  . PAC (premature atrial contraction)     PACs... EKG... January, 2011..  . CAD (coronary artery disease)   . Hx of CABG     2006, [facility], LIMA to LAD, SVG to PDA, SVG to OM1, SVG to diagonal 1  . Ejection fraction     EF 50%, echo, 2006  . Carotid artery disease     Doppler, May, 2013, 60-79% bilateral, plan followup 6 months    Past Surgical History  Procedure Laterality Date  . Coronary artery bypass graft  2006    [facility]    Patient Active Problem List   Diagnosis Date Noted  . Dyslipidemia 08/01/2013  . Atrial fibrillation 06/19/2012  . Carotid artery disease   . Tremor   . Hypertension   . PAC (premature atrial contraction)   . CAD (coronary artery disease)   . Hx of CABG   . Ejection fraction       Current Outpatient Prescriptions  Medication Sig Dispense Refill  . aspirin EC 81 MG tablet Take 81 mg by mouth daily.     Marland Kitchen atorvastatin (LIPITOR) 40 MG tablet TAKE 1 TABLET EVERY EVENING 90 tablet 3  . chlorthalidone (HYGROTON) 25 MG tablet Take 1 tablet (25 mg total) by mouth daily. 90 tablet 3  . Cinnamon 500 MG TABS Take 2 tablets by mouth daily.     . cyanocobalamin 500 MCG tablet  Take 500 mcg by mouth daily.    Marland Kitchen lisinopril (PRINIVIL,ZESTRIL) 40 MG tablet Take 1 tablet (40 mg total) by mouth daily. 90 tablet 3  . metoprolol tartrate (LOPRESSOR) 25 MG tablet Take 0.5 tablets (12.5 mg total) by mouth 2 (two) times daily. 90 tablet 3  . Multiple Vitamin (MULTIVITAMIN) tablet Take 1 tablet by mouth daily.    . Potassium 99 MG TABS Take 1 tablet by mouth daily.     No current facility-administered medications for this visit.    Allergies:   Niaspan    Social History:  The patient  reports that he quit smoking about 35 years ago. His smoking use included Cigarettes. He has a 80 pack-year smoking history. He quit smokeless tobacco use about 33 years ago. His smokeless tobacco use included Snuff and Chew.   Family History:  There is a family history of coronary artery disease.   ROS:  Please see the history of present illness.     The patient denies fever, chills, headache, sweats, rash, change in vision, change in hearing, chest pain, cough, nausea or vomiting, urinary symptoms. All other systems are reviewed and are negative.   PHYSICAL EXAM: VS:  BP 115/72 mmHg  Pulse 72  Ht  (1.803 m)  Wt 211 lb 12.8 oz (96.072 kg)  BMI 29.55 kg/m2  SpO2 99% , Patient is oriented to person time and place. Affect is normal. He is here with his wife. Head is atraumatic. Sclera and conjunctiva are normal. There is no jugular venous distention. Lungs are clear. Respiratory effort is nonlabored. Cardiac exam reveals S1 and S2. The abdomen is soft. There is no peripheral edema. There are no musculoskeletal deformities. There are no skin rashes. Neurologic is grossly intact.  EKG:   EKG is done today and reviewed by me. There is no change from the past. He does have small inferior Q wave in 3.   Recent Labs: No results found for requested labs within last 365 days.    Lipid Panel No results found for: CHOL, TRIG, HDL, CHOLHDL, VLDL, LDLCALC, LDLDIRECT    Wt Readings from  Last 3 Encounters:  08/13/14 211 lb 12.8 oz (96.072 kg)  08/01/13 211 lb (95.709 kg)  06/19/12 213 lb (96.616 kg)      Current medicines are reviewed  Patient understands his medications.     ASSESSMENT AND PLAN:

## 2014-08-13 NOTE — Patient Instructions (Signed)
Your physician recommends that you continue on your current medications as directed. Please refer to the Current Medication list given to you today. Your physician has requested that you have a carotid duplex. This test is an ultrasound of the carotid arteries in your neck. It looks at blood flow through these arteries that supply the brain with blood. Allow one hour for this exam. There are no restrictions or special instructions. Your physician recommends that you schedule a follow-up appointment in: 1 year. You will receive a reminder letter in the mail in about 10 months reminding you to call and schedule your appointment. If you don't receive this letter, please contact our office. Refills sent today to your mail order pharmacy.

## 2014-08-25 DIAGNOSIS — I6523 Occlusion and stenosis of bilateral carotid arteries: Secondary | ICD-10-CM | POA: Diagnosis not present

## 2014-08-26 ENCOUNTER — Ambulatory Visit (INDEPENDENT_AMBULATORY_CARE_PROVIDER_SITE_OTHER): Payer: Medicare Other

## 2014-08-26 DIAGNOSIS — I779 Disorder of arteries and arterioles, unspecified: Secondary | ICD-10-CM

## 2014-08-26 DIAGNOSIS — I739 Peripheral vascular disease, unspecified: Principal | ICD-10-CM

## 2014-09-04 ENCOUNTER — Telehealth: Payer: Self-pay | Admitting: *Deleted

## 2014-09-04 NOTE — Telephone Encounter (Signed)
Patient's wife informed

## 2015-08-13 ENCOUNTER — Ambulatory Visit: Payer: Medicare Other | Admitting: Cardiovascular Disease

## 2015-08-23 ENCOUNTER — Ambulatory Visit: Payer: Medicare Other | Admitting: Cardiovascular Disease

## 2015-08-27 ENCOUNTER — Encounter: Payer: Self-pay | Admitting: Cardiovascular Disease

## 2015-08-27 ENCOUNTER — Ambulatory Visit (INDEPENDENT_AMBULATORY_CARE_PROVIDER_SITE_OTHER): Payer: Medicare Other | Admitting: Cardiovascular Disease

## 2015-08-27 VITALS — BP 132/70 | HR 60 | Ht 71.0 in | Wt 211.0 lb

## 2015-08-27 DIAGNOSIS — I25810 Atherosclerosis of coronary artery bypass graft(s) without angina pectoris: Secondary | ICD-10-CM

## 2015-08-27 DIAGNOSIS — I6523 Occlusion and stenosis of bilateral carotid arteries: Secondary | ICD-10-CM | POA: Diagnosis not present

## 2015-08-27 DIAGNOSIS — I10 Essential (primary) hypertension: Secondary | ICD-10-CM

## 2015-08-27 MED ORDER — METOPROLOL TARTRATE 25 MG PO TABS: 12.5000 mg | ORAL_TABLET | Freq: Two times a day (BID) | ORAL | Status: DC

## 2015-08-27 MED ORDER — LISINOPRIL 40 MG PO TABS: 40.0000 mg | ORAL_TABLET | Freq: Every day | ORAL | Status: DC

## 2015-08-27 MED ORDER — ATORVASTATIN CALCIUM 40 MG PO TABS: 40.0000 mg | ORAL_TABLET | Freq: Every evening | ORAL | Status: DC

## 2015-08-27 MED ORDER — CHLORTHALIDONE 25 MG PO TABS: 25.0000 mg | ORAL_TABLET | Freq: Every day | ORAL | Status: DC

## 2015-08-27 NOTE — Addendum Note (Signed)
Addended by: Burman Nieves T on: 08/27/2015 11:11 AM   Modules accepted: Orders

## 2015-08-27 NOTE — Patient Instructions (Signed)
Your physician wants you to follow-up in: 1 YEAR WITH DR. KONESWARAN You will receive a reminder letter in the mail two months in advance. If you don't receive a letter, please call our office to schedule the follow-up appointment.  Your physician recommends that you continue on your current medications as directed. Please refer to the Current Medication list given to you today.  Your physician has requested that you have a carotid duplex. This test is an ultrasound of the carotid arteries in your neck. It looks at blood flow through these arteries that supply the brain with blood. Allow one hour for this exam. There are no restrictions or special instructions.  Thank you for choosing [facility]!!    

## 2015-08-27 NOTE — Progress Notes (Signed)
Patient ID: William Hatfield, male   DOB: Sep 24, 1945, 70 y.o.   MRN: [id number]      SUBJECTIVE: The patient returns for annual follow-up. This is my first time meeting him. He is a former patient of Dr. Myrtis Ser. He underwent four-vessel coronary artery bypass graft surgery in 2006 at [facility]. He also has a history of bilateral carotid artery stenosis.  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  Prior to undergoing bypass surgery his symptoms were that of shortness of breath, chest pain, headache, and neck pain. 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 He denies exertional chest pain and dyspnea.   Review of Systems: As per "subjective", otherwise negative.  Allergies  Allergen Reactions  . Niaspan [Niacin] Hives    NIASPAN ONLY/REACTION: Itching, whelps    Current Outpatient Prescriptions  Medication Sig Dispense Refill  . aspirin EC 81 MG tablet Take 81 mg by mouth daily.     Marland Kitchen atorvastatin (LIPITOR) 40 MG tablet Take 1 tablet (40 mg total) by mouth every evening. 90 tablet 3  . chlorthalidone (HYGROTON) 25 MG tablet Take 1 tablet (25 mg total) by mouth daily. 90 tablet 3  . Cinnamon 500 MG TABS Take 2 tablets by mouth daily.     . cyanocobalamin 500 MCG tablet Take 500 mcg by mouth daily.    Marland Kitchen lisinopril (PRINIVIL,ZESTRIL) 40 MG tablet Take 1 tablet (40 mg total) by mouth daily. 90 tablet 3  . metoprolol tartrate (LOPRESSOR) 25 MG tablet Take 0.5 tablets (12.5 mg total) by mouth 2 (two) times daily. 90 tablet 3  . Multiple Vitamin (MULTIVITAMIN) tablet Take 1 tablet by mouth daily.    . Potassium 99 MG TABS Take 1 tablet by mouth daily.     No current facility-administered medications for this visit.    Past Medical History  Diagnosis Date  . Tremor   . Hypertension   . Atrial fibrillation (HCC)     post CABG.... amiodarone....  resolved  . PAC (premature atrial contraction)     PACs... EKG... January, 2011..  . CAD (coronary artery disease)   . Hx of CABG     2006, [facility], LIMA to LAD, SVG to PDA, SVG to OM1, SVG to diagonal 1  . Ejection fraction     EF 50%, echo, 2006  . Carotid artery disease (HCC)     Doppler, May, 2013, 60-79% bilateral, plan followup 6 months    Past Surgical History  Procedure Laterality Date  . Coronary artery bypass graft  2006    [facility]    Social History   Social History  . Marital Status: Married    Spouse Name: N/A  . Number of Children: N/A  . Years of Education: N/A   Occupational History  . Not on file.   Social History Main Topics  . Smoking status: Former Smoker -- 4.00 packs/day for 20 years    Types: Cigarettes    Quit date: 02/28/1979  . Smokeless tobacco: Former Neurosurgeon    Types: Snuff, Chew    Quit date: 02/27/1981  . Alcohol Use: Not on file  . Drug Use: Not on file  . Sexual Activity: Not on file   Other Topics Concern  . Not on file   Social History Narrative     Filed Vitals:   08/27/15 1052  BP: 132/70  Pulse:  60  Height: 5\' 11"  (1.803 m)  Weight: 211 lb (95.709 kg)  SpO2: 99%    PHYSICAL EXAM General: NAD HEENT: Normal. Neck: No JVD, no thyromegaly. Lungs: Clear to auscultation bilaterally with normal respiratory effort. CV: Nondisplaced PMI.  Regular rate and rhythm, normal S1/S2, no S3/S4, no murmur. No pretibial or periankle edema.  No carotid bruit.   Abdomen: Soft, nontender, no distention.  Neurologic: Alert and oriented.  Psych: Normal affect. Skin: Normal. Musculoskeletal: No gross deformities.    ECG: Most recent ECG reviewed.      ASSESSMENT AND PLAN: 1. CAD/CABG: Symptomatically stable. Continue aspirin, statin, and metoprolol.  2. Bilateral carotid artery stenosis: 60-79% bilaterally in 07/2014. Will repeat.  3. Essential HTN: Controlled. No changes.  Dispo: fu 1 year.   Prentice Docker, M.D.,  F.A.C.C.

## 2015-09-08 ENCOUNTER — Ambulatory Visit: Payer: Medicare Other

## 2015-09-08 DIAGNOSIS — I6523 Occlusion and stenosis of bilateral carotid arteries: Secondary | ICD-10-CM

## 2015-09-08 LAB — VAS US CAROTID
LCCADSYS: -101 cm/s
LCCAPDIAS: 26 cm/s
LCCAPSYS: 107 cm/s
LEFT ECA DIAS: -29 cm/s
LEFT VERTEBRAL DIAS: -20 cm/s
LICADSYS: -95 cm/s
Left CCA dist dias: -30 cm/s
Left ICA dist dias: -32 cm/s
Left ICA prox dias: -59 cm/s
Left ICA prox sys: -225 cm/s
RCCADSYS: -108 cm/s
RCCAPDIAS: 23 cm/s
RCCAPSYS: 87 cm/s
RIGHT ECA DIAS: -21 cm/s
RIGHT VERTEBRAL DIAS: -12 cm/s

## 2015-09-14 ENCOUNTER — Telehealth: Payer: Self-pay | Admitting: *Deleted

## 2015-09-14 NOTE — Telephone Encounter (Signed)
Notes Recorded by Lesle Chris, LPN on 0/30/0923 at 1:13 PM Patient notified via voice mail. Copy to pmd.  Notes Recorded by Laqueta Linden, MD on 09/13/2015 at 1:44 PM Repeat in 1 year. Moderate blockages bilaterally.

## 2016-07-28 ENCOUNTER — Other Ambulatory Visit: Payer: Self-pay | Admitting: Cardiovascular Disease

## 2016-07-28 DIAGNOSIS — I6523 Occlusion and stenosis of bilateral carotid arteries: Secondary | ICD-10-CM

## 2016-08-25 ENCOUNTER — Ambulatory Visit (INDEPENDENT_AMBULATORY_CARE_PROVIDER_SITE_OTHER): Payer: Medicare Other | Admitting: Cardiovascular Disease

## 2016-08-25 ENCOUNTER — Encounter: Payer: Self-pay | Admitting: Cardiovascular Disease

## 2016-08-25 VITALS — BP 122/74 | HR 64 | Ht 71.0 in | Wt 211.0 lb

## 2016-08-25 DIAGNOSIS — E785 Hyperlipidemia, unspecified: Secondary | ICD-10-CM | POA: Diagnosis not present

## 2016-08-25 DIAGNOSIS — I25810 Atherosclerosis of coronary artery bypass graft(s) without angina pectoris: Secondary | ICD-10-CM | POA: Diagnosis not present

## 2016-08-25 DIAGNOSIS — I6523 Occlusion and stenosis of bilateral carotid arteries: Secondary | ICD-10-CM | POA: Diagnosis not present

## 2016-08-25 DIAGNOSIS — I10 Essential (primary) hypertension: Secondary | ICD-10-CM | POA: Diagnosis not present

## 2016-08-25 MED ORDER — LISINOPRIL 40 MG PO TABS: 40.0000 mg | ORAL_TABLET | Freq: Every day | ORAL | 3 refills | Status: DC

## 2016-08-25 MED ORDER — ATORVASTATIN CALCIUM 40 MG PO TABS: 40.0000 mg | ORAL_TABLET | Freq: Every evening | ORAL | 3 refills | Status: DC

## 2016-08-25 MED ORDER — CHLORTHALIDONE 25 MG PO TABS: 25.0000 mg | ORAL_TABLET | Freq: Every day | ORAL | 3 refills | Status: DC

## 2016-08-25 MED ORDER — METOPROLOL TARTRATE 25 MG PO TABS: 12.5000 mg | ORAL_TABLET | Freq: Two times a day (BID) | ORAL | 3 refills | Status: DC

## 2016-08-25 NOTE — Patient Instructions (Signed)
Medication Instructions:  Continue all current medications.  Labwork:  Lipids - order given today.   Reminder:  Nothing to eat or drink after 12 midnight prior to labs.  Office will contact with results via phone or letter.     Testing/Procedures: none  Follow-Up: Your physician wants you to follow up in:  1 year.  You will receive a reminder letter in the mail one-two months in advance.  If you don't receive a letter, please call our office to schedule the follow up appointment.    Any Other Special Instructions Will Be Listed Below (If Applicable).  If you need a refill on your cardiac medications before your next appointment, please call your pharmacy.  

## 2016-08-25 NOTE — Progress Notes (Signed)
SUBJECTIVE: The patient presents for annual follow-up. He has a history of four-vessel coronary artery bypass graft surgery in 2006 at [facility]. He also has a history of bilateral carotid artery stenosis. Prior to undergoing bypass surgery his symptoms were that of shortness of breath, chest pain, headache, and neck pain.   His wife is also my patient.  The patient denies any symptoms of chest pain, palpitations, shortness of breath, lightheadedness, dizziness, leg swelling, orthopnea, PND, and syncope.  ECG performed in the office today which I ordered and personally interpreted demonstrates normal sinus rhythm with no ischemic ST segment or T-wave abnormalities, nor any arrhythmias.      Review of Systems: As per "subjective", otherwise negative.  Allergies  Allergen Reactions  . Niaspan [Niacin] Hives    NIASPAN ONLY/REACTION: Itching, whelps    Current Outpatient Prescriptions  Medication Sig Dispense Refill  . aspirin EC 81 MG tablet Take 81 mg by mouth daily.     Marland Kitchen atorvastatin (LIPITOR) 40 MG tablet Take 1 tablet (40 mg total) by mouth every evening. 90 tablet 3  . chlorthalidone (HYGROTON) 25 MG tablet Take 1 tablet (25 mg total) by mouth daily. 90 tablet 3  . Cinnamon 500 MG TABS Take 2 tablets by mouth daily.     . cyanocobalamin 500 MCG tablet Take 500 mcg by mouth daily.    Marland Kitchen lisinopril (PRINIVIL,ZESTRIL) 40 MG tablet Take 1 tablet (40 mg total) by mouth daily. 90 tablet 3  . metoprolol tartrate (LOPRESSOR) 25 MG tablet Take 0.5 tablets (12.5 mg total) by mouth 2 (two) times daily. 90 tablet 3  . Multiple Vitamin (MULTIVITAMIN) tablet Take 1 tablet by mouth daily.    . Potassium 99 MG TABS Take 1 tablet by mouth daily.     No current facility-administered medications for this visit.     Past Medical History:  Diagnosis Date  . Atrial fibrillation (HCC)    post CABG.... amiodarone.... resolved  . CAD (coronary artery disease)   . Carotid artery disease (HCC)     Doppler, May, 2013, 60-79% bilateral, plan followup 6 months  . Ejection fraction    EF 50%, echo, 2006  . Hx of CABG    2006, [facility], LIMA to LAD, SVG to PDA, SVG to OM1, SVG to diagonal 1  . Hypertension   . PAC (premature atrial contraction)    PACs... EKG... January, 2011..  . Tremor     Past Surgical History:  Procedure Laterality Date  . CORONARY ARTERY BYPASS GRAFT  2006   [facility]    Social History   Social History  . Marital status: Married    Spouse name: N/A  . Number of children: N/A  . Years of education: N/A   Occupational History  . Not on file.   Social History Main Topics  . Smoking status: Former Smoker    Packs/day: 4.00    Years: 20.00    Types: Cigarettes    Quit date: 02/28/1979  . Smokeless tobacco: Former Neurosurgeon    Types: Snuff, Chew    Quit date: 02/27/1981  . Alcohol use Not on file  . Drug use: Unknown  . Sexual activity: Not on file   Other Topics Concern  . Not on file   Social History Narrative  . No narrative on file     Vitals:   08/25/16 1055  BP: 122/74  Pulse: 64  SpO2: 98%  Weight: 211 lb (95.7 kg)  Height: 5\' 11"  (  1.803 m)    Wt Readings from Last 3 Encounters:  08/25/16 211 lb (95.7 kg)  08/27/15 211 lb (95.7 kg)  08/13/14 211 lb 12.8 oz (96.1 kg)     PHYSICAL EXAM General: NAD HEENT: Normal. Neck: No JVD, no thyromegaly. Lungs: Clear to auscultation bilaterally with normal respiratory effort. CV: Nondisplaced PMI.  Regular rate and rhythm, normal S1/S2, no S3/S4, no murmur. No pretibial or periankle edema.  No carotid bruit.   Abdomen: Soft, nontender, no distention.  Neurologic: Alert and oriented.  Psych: Normal affect. Skin: Normal. Musculoskeletal: No gross deformities.    ECG: Most recent ECG reviewed.   Labs: No results found for: K, BUN, CREATININE, ALT, TSH, HGB   Lipids: No results found for: LDLCALC, LDLDIRECT, CHOL, TRIG, HDL     ASSESSMENT AND PLAN: 1. CAD/CABG: Stable ischemic heart  disease. I will refill all cardiac medications. Continue aspirin, Lipitor, and metoprolol.  2. Bilateral carotid artery stenosis: Moderate in severity bilaterally (40-59%) in July 2017. Repeat Dopplers scheduled for July 19. Continue aspirin and statin.  3. Essential HTN: Controlled. No changes.  4. Dyslipidemia: I will check lipids. Continue Lipitor 40 mg.    Disposition: Follow up 1 yr.  Prentice Docker, M.D., F.A.C.C.

## 2016-09-14 ENCOUNTER — Ambulatory Visit: Payer: Medicare Other

## 2016-09-14 DIAGNOSIS — I6523 Occlusion and stenosis of bilateral carotid arteries: Secondary | ICD-10-CM

## 2016-09-18 ENCOUNTER — Encounter (HOSPITAL_COMMUNITY): Payer: Self-pay | Admitting: Student

## 2016-09-18 ENCOUNTER — Inpatient Hospital Stay (HOSPITAL_COMMUNITY)
Admission: AD | Admit: 2016-09-18 | Discharge: 2016-09-20 | DRG: 251 | Disposition: A | Discharge status: Home / Self Care | Payer: Medicare Other | Source: Other Acute Inpatient Hospital | Attending: Internal Medicine | Admitting: Internal Medicine

## 2016-09-18 ENCOUNTER — Encounter (HOSPITAL_COMMUNITY)
Admission: AD | Disposition: A | Discharge status: Home / Self Care | Payer: Self-pay | Source: Other Acute Inpatient Hospital | Attending: Internal Medicine

## 2016-09-18 DIAGNOSIS — I200 Unstable angina: Secondary | ICD-10-CM | POA: Diagnosis present

## 2016-09-18 DIAGNOSIS — I214 Non-ST elevation (NSTEMI) myocardial infarction: Secondary | ICD-10-CM | POA: Diagnosis present

## 2016-09-18 DIAGNOSIS — Z951 Presence of aortocoronary bypass graft: Secondary | ICD-10-CM

## 2016-09-18 DIAGNOSIS — I480 Paroxysmal atrial fibrillation: Secondary | ICD-10-CM | POA: Diagnosis not present

## 2016-09-18 DIAGNOSIS — Z7982 Long term (current) use of aspirin: Secondary | ICD-10-CM

## 2016-09-18 DIAGNOSIS — I25810 Atherosclerosis of coronary artery bypass graft(s) without angina pectoris: Secondary | ICD-10-CM | POA: Diagnosis not present

## 2016-09-18 DIAGNOSIS — I129 Hypertensive chronic kidney disease with stage 1 through stage 4 chronic kidney disease, or unspecified chronic kidney disease: Secondary | ICD-10-CM | POA: Diagnosis not present

## 2016-09-18 DIAGNOSIS — Z87891 Personal history of nicotine dependence: Secondary | ICD-10-CM | POA: Diagnosis not present

## 2016-09-18 DIAGNOSIS — I10 Essential (primary) hypertension: Secondary | ICD-10-CM | POA: Diagnosis present

## 2016-09-18 DIAGNOSIS — I25710 Atherosclerosis of autologous vein coronary artery bypass graft(s) with unstable angina pectoris: Secondary | ICD-10-CM

## 2016-09-18 DIAGNOSIS — I2510 Atherosclerotic heart disease of native coronary artery without angina pectoris: Secondary | ICD-10-CM | POA: Diagnosis present

## 2016-09-18 DIAGNOSIS — E785 Hyperlipidemia, unspecified: Secondary | ICD-10-CM | POA: Diagnosis not present

## 2016-09-18 DIAGNOSIS — I2582 Chronic total occlusion of coronary artery: Secondary | ICD-10-CM | POA: Diagnosis present

## 2016-09-18 DIAGNOSIS — Z79899 Other long term (current) drug therapy: Secondary | ICD-10-CM | POA: Diagnosis not present

## 2016-09-18 DIAGNOSIS — I739 Peripheral vascular disease, unspecified: Secondary | ICD-10-CM

## 2016-09-18 DIAGNOSIS — I779 Disorder of arteries and arterioles, unspecified: Secondary | ICD-10-CM | POA: Diagnosis present

## 2016-09-18 DIAGNOSIS — I6523 Occlusion and stenosis of bilateral carotid arteries: Secondary | ICD-10-CM | POA: Diagnosis present

## 2016-09-18 DIAGNOSIS — N183 Chronic kidney disease, stage 3 (moderate): Secondary | ICD-10-CM | POA: Diagnosis present

## 2016-09-18 DIAGNOSIS — R0600 Dyspnea, unspecified: Secondary | ICD-10-CM

## 2016-09-18 DIAGNOSIS — I219 Acute myocardial infarction, unspecified: Secondary | ICD-10-CM

## 2016-09-18 DIAGNOSIS — Z8249 Family history of ischemic heart disease and other diseases of the circulatory system: Secondary | ICD-10-CM

## 2016-09-18 DIAGNOSIS — I25110 Atherosclerotic heart disease of native coronary artery with unstable angina pectoris: Secondary | ICD-10-CM | POA: Diagnosis present

## 2016-09-18 DIAGNOSIS — N189 Chronic kidney disease, unspecified: Secondary | ICD-10-CM | POA: Diagnosis present

## 2016-09-18 DIAGNOSIS — Z888 Allergy status to other drugs, medicaments and biological substances status: Secondary | ICD-10-CM | POA: Diagnosis not present

## 2016-09-18 HISTORY — PX: LEFT HEART CATH AND CORS/GRAFTS ANGIOGRAPHY: CATH118250

## 2016-09-18 HISTORY — DX: Acute myocardial infarction, unspecified: I21.9

## 2016-09-18 HISTORY — DX: Chronic kidney disease, unspecified: N18.9

## 2016-09-18 HISTORY — PX: CORONARY BALLOON ANGIOPLASTY: CATH118233

## 2016-09-18 LAB — BASIC METABOLIC PANEL
ANION GAP: 9 (ref 5–15)
BUN: 31 mg/dL — ABNORMAL HIGH (ref 6–20)
CO2: 22 mmol/L (ref 22–32)
Calcium: 9.2 mg/dL (ref 8.9–10.3)
Chloride: 105 mmol/L (ref 101–111)
Creatinine, Ser: 1.63 mg/dL — ABNORMAL HIGH (ref 0.61–1.24)
GFR calc Af Amer: 48 mL/min — ABNORMAL LOW (ref >60)
GFR calc non Af Amer: 41 mL/min — ABNORMAL LOW (ref >60)
GLUCOSE: 135 mg/dL — AB (ref 65–99)
Potassium: 4.3 mmol/L (ref 3.5–5.1)
Sodium: 136 mmol/L (ref 135–145)

## 2016-09-18 LAB — CBC
HEMATOCRIT: 39.1 % (ref 39.0–52.0)
Hemoglobin: 13.2 g/dL (ref 13.0–17.0)
MCH: 28.9 pg (ref 26.0–34.0)
MCHC: 33.8 g/dL (ref 30.0–36.0)
MCV: 85.7 fL (ref 78.0–100.0)
Platelets: 219 10*3/uL (ref 150–400)
RBC: 4.56 MIL/uL (ref 4.22–5.81)
RDW: 14.5 % (ref 11.5–15.5)
WBC: 11.2 10*3/uL — AB (ref 4.0–10.5)

## 2016-09-18 LAB — TROPONIN I
TROPONIN I: 13.83 ng/mL — AB (ref <0.03)
TROPONIN I: 18.12 ng/mL — AB (ref <0.03)
Troponin I: 1.91 ng/mL (ref <0.03)

## 2016-09-18 LAB — POCT ACTIVATED CLOTTING TIME: ACTIVATED CLOTTING TIME: 197 s

## 2016-09-18 LAB — LIPID PANEL
CHOL/HDL RATIO: 4 ratio
Cholesterol: 117 mg/dL (ref 0–200)
HDL: 29 mg/dL — ABNORMAL LOW (ref >40)
LDL Cholesterol: 77 mg/dL (ref 0–99)
Triglycerides: 56 mg/dL (ref <150)
VLDL: 11 mg/dL (ref 0–40)

## 2016-09-18 LAB — HEPARIN LEVEL (UNFRACTIONATED): HEPARIN UNFRACTIONATED: 0.44 [IU]/mL (ref 0.30–0.70)

## 2016-09-18 LAB — PROTIME-INR
INR: 1.08
Prothrombin Time: 14.1 seconds (ref 11.4–15.2)

## 2016-09-18 SURGERY — LEFT HEART CATH AND CORS/GRAFTS ANGIOGRAPHY
Anesthesia: LOCAL

## 2016-09-18 MED ORDER — HEPARIN SODIUM (PORCINE) 1000 UNIT/ML IJ SOLN
INTRAMUSCULAR | Status: AC
  Filled 2016-09-18: qty 1

## 2016-09-18 MED ORDER — HEPARIN (PORCINE) IN NACL 100-0.45 UNIT/ML-% IJ SOLN: 1000.0000 [IU]/h | INTRAMUSCULAR | Status: DC

## 2016-09-18 MED ORDER — HEPARIN (PORCINE) IN NACL 2-0.9 UNIT/ML-% IJ SOLN
INTRAMUSCULAR | Status: AC
  Filled 2016-09-18: qty 1000

## 2016-09-18 MED ORDER — FENTANYL CITRATE (PF) 100 MCG/2ML IJ SOLN
INTRAMUSCULAR | Status: DC | PRN
  Administered 2016-09-18: 25 ug via INTRAVENOUS

## 2016-09-18 MED ORDER — TICAGRELOR 90 MG PO TABS
ORAL_TABLET | ORAL | Status: AC
Start: 2016-09-18 — End: 2016-09-18
  Filled 2016-09-18: qty 2

## 2016-09-18 MED ORDER — MIDAZOLAM HCL 2 MG/2ML IJ SOLN
INTRAMUSCULAR | Status: AC
  Filled 2016-09-18: qty 2

## 2016-09-18 MED ORDER — HEPARIN (PORCINE) IN NACL 2-0.9 UNIT/ML-% IJ SOLN
INTRAMUSCULAR | Status: DC | PRN
  Administered 2016-09-18: 10 mL via INTRA_ARTERIAL

## 2016-09-18 MED ORDER — NITROGLYCERIN IN D5W 200-5 MCG/ML-% IV SOLN: 0.0000 ug/min | INTRAVENOUS | Status: DC

## 2016-09-18 MED ORDER — SODIUM CHLORIDE 0.9 % IV SOLN: 250.0000 mL | INTRAVENOUS | Status: DC | PRN

## 2016-09-18 MED ORDER — MORPHINE SULFATE (PF) 2 MG/ML IV SOLN
2.0000 mg | INTRAVENOUS | Status: DC | PRN
  Administered 2016-09-18: 2 mg via INTRAVENOUS
  Filled 2016-09-18: qty 1

## 2016-09-18 MED ORDER — VERAPAMIL HCL 2.5 MG/ML IV SOLN
INTRAVENOUS | Status: DC | PRN
  Administered 2016-09-18: 200 ug via INTRACORONARY
  Administered 2016-09-18 (×2): 100 ug via INTRACORONARY

## 2016-09-18 MED ORDER — VERAPAMIL HCL 2.5 MG/ML IV SOLN
INTRAVENOUS | Status: AC
  Filled 2016-09-18: qty 2

## 2016-09-18 MED ORDER — IOPAMIDOL (ISOVUE-370) INJECTION 76%
INTRAVENOUS | Status: DC | PRN
Start: 2016-09-18 — End: 2016-09-18
  Administered 2016-09-18: 105 mL via INTRA_ARTERIAL

## 2016-09-18 MED ORDER — SODIUM CHLORIDE 0.9% FLUSH: 3.0000 mL | INTRAVENOUS | Status: DC | PRN

## 2016-09-18 MED ORDER — TICAGRELOR 90 MG PO TABS
ORAL_TABLET | ORAL | Status: DC | PRN
  Administered 2016-09-18: 180 mg via ORAL

## 2016-09-18 MED ORDER — METOPROLOL TARTRATE 12.5 MG HALF TABLET
12.5000 mg | ORAL_TABLET | Freq: Two times a day (BID) | ORAL | Status: DC
  Administered 2016-09-18 – 2016-09-19 (×4): 12.5 mg via ORAL
  Filled 2016-09-18 (×4): qty 1

## 2016-09-18 MED ORDER — LIDOCAINE HCL (PF) 1 % IJ SOLN
INTRAMUSCULAR | Status: DC | PRN
  Administered 2016-09-18: 2 mL via SUBCUTANEOUS

## 2016-09-18 MED ORDER — MIDAZOLAM HCL 2 MG/2ML IJ SOLN
INTRAMUSCULAR | Status: DC | PRN
Start: 2016-09-18 — End: 2016-09-18
  Administered 2016-09-18: 2 mg via INTRAVENOUS

## 2016-09-18 MED ORDER — ONDANSETRON HCL 4 MG/2ML IJ SOLN: 4.0000 mg | Freq: Four times a day (QID) | INTRAMUSCULAR | Status: DC | PRN

## 2016-09-18 MED ORDER — CLOPIDOGREL BISULFATE 75 MG PO TABS
75.0000 mg | ORAL_TABLET | Freq: Every day | ORAL | Status: DC
  Administered 2016-09-19 – 2016-09-20 (×2): 75 mg via ORAL
  Filled 2016-09-18 (×2): qty 1

## 2016-09-18 MED ORDER — ACETAMINOPHEN 325 MG PO TABS: 650.0000 mg | ORAL_TABLET | ORAL | Status: DC | PRN

## 2016-09-18 MED ORDER — SODIUM CHLORIDE 0.9 % IV SOLN
INTRAVENOUS | Status: DC
  Administered 2016-09-18: 09:00:00 via INTRAVENOUS

## 2016-09-18 MED ORDER — FENTANYL CITRATE (PF) 100 MCG/2ML IJ SOLN
INTRAMUSCULAR | Status: AC
  Filled 2016-09-18: qty 2

## 2016-09-18 MED ORDER — SODIUM CHLORIDE 0.9% FLUSH
3.0000 mL | Freq: Two times a day (BID) | INTRAVENOUS | Status: DC
  Administered 2016-09-18 – 2016-09-19 (×3): 3 mL via INTRAVENOUS

## 2016-09-18 MED ORDER — LABETALOL HCL 5 MG/ML IV SOLN: 10.0000 mg | INTRAVENOUS | Status: AC | PRN

## 2016-09-18 MED ORDER — NITROGLYCERIN 0.4 MG SL SUBL: 0.4000 mg | SUBLINGUAL_TABLET | SUBLINGUAL | Status: DC | PRN

## 2016-09-18 MED ORDER — HEPARIN SODIUM (PORCINE) 1000 UNIT/ML IJ SOLN
INTRAMUSCULAR | Status: DC | PRN
  Administered 2016-09-18: 4000 [IU] via INTRAVENOUS
  Administered 2016-09-18: 8000 [IU] via INTRAVENOUS

## 2016-09-18 MED ORDER — ASPIRIN EC 81 MG PO TBEC
81.0000 mg | DELAYED_RELEASE_TABLET | Freq: Every day | ORAL | Status: DC
  Administered 2016-09-18 – 2016-09-19 (×2): 81 mg via ORAL
  Filled 2016-09-18 (×2): qty 1

## 2016-09-18 MED ORDER — IOPAMIDOL (ISOVUE-370) INJECTION 76%
INTRAVENOUS | Status: AC
  Filled 2016-09-18: qty 125

## 2016-09-18 MED ORDER — HYDRALAZINE HCL 20 MG/ML IJ SOLN: 5.0000 mg | INTRAMUSCULAR | Status: AC | PRN

## 2016-09-18 MED ORDER — ASPIRIN 81 MG PO CHEW: 81.0000 mg | CHEWABLE_TABLET | ORAL | Status: DC

## 2016-09-18 MED ORDER — LIDOCAINE HCL (PF) 1 % IJ SOLN
INTRAMUSCULAR | Status: AC
  Filled 2016-09-18: qty 30

## 2016-09-18 MED ORDER — IOPAMIDOL (ISOVUE-370) INJECTION 76%
INTRAVENOUS | Status: AC
  Filled 2016-09-18: qty 50

## 2016-09-18 MED ORDER — ATORVASTATIN CALCIUM 40 MG PO TABS: 40.0000 mg | ORAL_TABLET | Freq: Every evening | ORAL | Status: DC

## 2016-09-18 MED ORDER — HEPARIN (PORCINE) IN NACL 2-0.9 UNIT/ML-% IJ SOLN
INTRAMUSCULAR | Status: AC | PRN
  Administered 2016-09-18: 1000 mL

## 2016-09-18 MED ORDER — ATORVASTATIN CALCIUM 40 MG PO TABS
40.0000 mg | ORAL_TABLET | Freq: Every day | ORAL | Status: DC
  Administered 2016-09-18 – 2016-09-19 (×2): 40 mg via ORAL
  Filled 2016-09-18 (×2): qty 1

## 2016-09-18 MED ORDER — SODIUM CHLORIDE 0.9% FLUSH: 3.0000 mL | Freq: Two times a day (BID) | INTRAVENOUS | Status: DC

## 2016-09-18 MED FILL — Nitroglycerin IV Soln 200 MCG/ML in D5W: INTRAVENOUS | Qty: 250 | Status: AC

## 2016-09-18 MED FILL — Heparin Sodium (Porcine) 100 Unt/ML in Sodium Chloride 0.45%: INTRAMUSCULAR | Qty: 250 | Status: AC

## 2016-09-18 SURGICAL SUPPLY — 18 items
BALLN EMERGE MR 2.0X30 (BALLOONS) ×2
BALLOON EMERGE MR 2.0X30 (BALLOONS) ×1 IMPLANT
CATH EXPO 5F MPA-1 (CATHETERS) ×2 IMPLANT
CATH INFINITI 5 FR IM (CATHETERS) ×2 IMPLANT
CATH INFINITI 5FR MULTPACK ANG (CATHETERS) ×2 IMPLANT
CATH LAUNCHER 6FR AL1 (CATHETERS) ×1 IMPLANT
CATHETER LAUNCHER 6FR AL1 (CATHETERS) ×2
CATHETER LAUNCHER 6FR LCB (CATHETERS) ×2 IMPLANT
DEVICE RAD COMP TR BAND LRG (VASCULAR PRODUCTS) ×2 IMPLANT
GLIDESHEATH SLEND SS 6F .021 (SHEATH) ×2 IMPLANT
GUIDEWIRE INQWIRE 1.5J.035X260 (WIRE) ×1 IMPLANT
INQWIRE 1.5J .035X260CM (WIRE) ×2
KIT ENCORE 26 ADVANTAGE (KITS) ×2 IMPLANT
KIT HEART LEFT (KITS) ×2 IMPLANT
PACK CARDIAC CATHETERIZATION (CUSTOM PROCEDURE TRAY) ×2 IMPLANT
TRANSDUCER W/STOPCOCK (MISCELLANEOUS) ×2 IMPLANT
TUBING CIL FLEX 10 FLL-RA (TUBING) ×2 IMPLANT
WIRE COUGAR XT STRL 190CM (WIRE) ×2 IMPLANT

## 2016-09-18 NOTE — Progress Notes (Signed)
ANTICOAGULATION CONSULT NOTE - Initial Consult  Pharmacy Consult for Heparin  Indication: chest pain/ACS  Allergies  Allergen Reactions  . Niaspan [Niacin] Hives and Other (See Comments)    NIASPAN ONLY/REACTION: Itching, whelps    Patient Measurements: Height: 5\' 11"  (180.3 cm) Weight: 206 lb 12.8 oz (93.8 kg) IBW/kg (Calculated) : 75.3 Heparin Dosing Weight: 93.8  Vital Signs: Temp: 98.4 F (36.9 C) (07/23 0655) Temp Source: Oral (07/23 0655) BP: 120/68 (07/23 0655) Pulse Rate: 74 (07/23 0655)  Labs:  Recent Labs  09/18/16 0810  TROPONINI 1.91*    CrCl cannot be calculated (No order found.).   Medical History: Past Medical History:  Diagnosis Date  . Atrial fibrillation (HCC)    post CABG.... amiodarone.... resolved  . CAD (coronary artery disease)    a. 2006: s/p CABG at [facility] with LIMA to LAD, SVG to PDA, SVG to OM1, SVG to D1  . Carotid artery disease (HCC)    Doppler, May, 2013, 60-79% bilateral, plan followup 6 months  . Ejection fraction    EF 50%, echo, 2006  . Hypertension   . PAC (premature atrial contraction)    PACs... EKG... January, 2011..  . Tremor     Medications:   Infusions:  . sodium chloride    . sodium chloride 100 mL/hr at 09/18/16 0842  . nitroGLYCERIN 10 mcg/min (09/18/16 0800)    Assessment: Pharmacy consulted to continue heparin drip started at outside hospital. Patient presented to outside hospital with 3 week history of shortness of breath and dyspnea on exertion and chest pain radiating to arms bilaterally overnight. The patient was given 4000 unit bolus of heparin and started on heparin drip at 1000 units/ hour around 0400. Patient reported taking aspirin at home before leaving for hospital this morning. Patient denies use of anticoagulants at home. CMC within normal limits from labs reported from outside hospital. No signs or symptoms of bleeding noted.   Goal of Therapy:  Heparin level 0.3-0.7 units/ml Monitor platelets  by anticoagulation protocol: Yes   Plan:  Continue heparin drip at 1000 units/ hour  Check 6 hour heparin level at 1100 Monitor for s/sx of bleeding  Daily CBC and heparin level   Blake Divine, Pharm.D. PGY1 Pharmacy Resident 09/18/2016 10:03 AM Main Pharmacy: [phone] Pager: [phone]

## 2016-09-18 NOTE — Progress Notes (Signed)
CRITICAL VALUE ALERT  Critical Value:  Troponin 1.09  Date & Time Notied:  7/23 @ 0900  Provider Notified: Randall An, PA  Orders Received/Actions taken: No

## 2016-09-18 NOTE — Progress Notes (Signed)
CRITICAL VALUE ALERT  Critical Value:  Trop 13.83  Date & Time Notied:  7/23 @ 1455  Provider Notified: Randall An, PA  Orders Received/Actions taken: No

## 2016-09-18 NOTE — Interval H&P Note (Signed)
Cath Lab Visit (complete for each Cath Lab visit)  Clinical Evaluation Leading to the Procedure:   ACS: Yes.    Non-ACS:    Anginal Classification: CCS IV  Anti-ischemic medical therapy: Minimal Therapy (1 class of medications)  Non-Invasive Test Results: No non-invasive testing performed  Prior CABG: Previous CABG      History and Physical Interval Note:  09/18/2016 5:32 PM  William Hatfield  has presented today for surgery, with the diagnosis of unstable angina  The various methods of treatment have been discussed with the patient and family. After consideration of risks, benefits and other options for treatment, the patient has consented to  Procedure(s): Left Heart Cath and Cors/Grafts Angiography (N/A) as a surgical intervention .  The patient's history has been reviewed, patient examined, no change in status, stable for surgery.  I have reviewed the patient's chart and labs.  Questions were answered to the patient's satisfaction.     Tonny Bollman

## 2016-09-18 NOTE — H&P (Signed)
History & Physical    Patient ID: William Hatfield MRN: [id number], DOB/AGE: [birth year]   Admit date: 09/18/2016  Primary Physician: Lorelei Pont, DO Primary Cardiologist: Dr. Purvis Sheffield   History of Present Illness    William Hatfield is a 71 y.o. male with past medical history of CAD (s/p CABG in 2006 with LIMA-LAD, SVG-PDA, SVG-OM1, and SVG-D1), carotid artery stenosis, HTN, HLD, Stage 3 CKD and PAC's/PVC's who presents to [facility] on 09/18/2016 as a transfer from [facility] for evaluation of chest pain.   He was recently evaluated by Dr. Purvis Sheffield on 08/25/2016 and reported doing well from a cardiac perspective at that time. Denied any recent chest pain or dyspnea on exertion and was continued on his current medication regimen.   In talking with the patient today, he reports having episodes of chest pain and dyspnea on exertion occurring for the past 3 weeks. He has noticed this mostly when walking up the hill in his backyard or push-mowing the grass. His symptoms typically resolve with rest. Last night, at 2300, he awoke from sleep secondary to chest pain. Says it felt like a tightness across his precordium and radiated to his jaw and down his arms bilaterally. Has experienced associated dyspnea, nausea, and vomiting. While at [facility], he was started on IV NTG and Heparin with improvement in his pain but is still having 3/10 chest pain at this time.   Labs showed WBC of 12.4, Hgb 14.2, platelets 249, creatinine 1.60, K+ 4.2, ProBNP 104.4, d-dimer 0.72, Troponin < 0.01. PT 9.8, PTT 20.0, INR Ratio 1.0 EKG shows NSR, HR 68, with slight ST elevation in lead II.   He denies any recent melena, hematochezia, or hematuria. No upcoming surgeries. Does have known Stage 3 CKD which is followed by Nephrology. Creatinine stable at 1.67 when checked in 2017 and at 1.60 when checked earlier today at [facility].  Past Medical History    Past Medical History:  Diagnosis Date  .  Atrial fibrillation (HCC)    post CABG.... amiodarone.... resolved  . CAD (coronary artery disease)    a. 2006: s/p CABG at [facility] with LIMA to LAD, SVG to PDA, SVG to OM1, SVG to D1  . Carotid artery disease (HCC)    Doppler, May, 2013, 60-79% bilateral, plan followup 6 months  . Ejection fraction    EF 50%, echo, 2006  . Hypertension   . PAC (premature atrial contraction)    PACs... EKG... January, 2011..  . Tremor     Past Surgical History:  Procedure Laterality Date  . CORONARY ARTERY BYPASS GRAFT  2006   [facility]     Allergies  Allergies  Allergen Reactions  . Niaspan [Niacin] Hives and Other (See Comments)    NIASPAN ONLY/REACTION: Itching, whelps     Home Medications    Prior to Admission medications   Medication Sig Start Date End Date Taking? Authorizing Provider  aspirin EC 81 MG tablet Take 81 mg by mouth daily.     [provider]  atorvastatin (LIPITOR) 40 MG tablet Take 1 tablet (40 mg total) by mouth every evening. 08/25/16   Laqueta Linden, MD  chlorthalidone (HYGROTON) 25 MG tablet Take 1 tablet (25 mg total) by mouth daily. 08/25/16   Laqueta Linden, MD  Cinnamon 500 MG TABS Take 2 tablets by mouth daily.     [provider]  cyanocobalamin 500 MCG tablet Take 500 mcg by mouth daily.    [provider]  lisinopril (PRINIVIL,ZESTRIL) 40 MG tablet Take 1 tablet (40 mg total) by mouth daily. 08/25/16   Laqueta Linden, MD  metoprolol tartrate (LOPRESSOR) 25 MG tablet Take 0.5 tablets (12.5 mg total) by mouth 2 (two) times daily. 08/25/16   Laqueta Linden, MD  Multiple Vitamin (MULTIVITAMIN) tablet Take 1 tablet by mouth daily.    [provider]  Potassium 99 MG TABS Take 1 tablet by mouth daily.    [provider]    Family History    Family History  Problem Relation Age of Onset  . Heart attack Father   . Heart attack Paternal Grandfather     Social History    Social History   Social  History  . Marital status: Married    Spouse name: N/A  . Number of children: N/A  . Years of education: N/A   Occupational History  . Not on file.   Social History Main Topics  . Smoking status: Former Smoker    Packs/day: 4.00    Years: 20.00    Types: Cigarettes    Quit date: 02/28/1979  . Smokeless tobacco: Former Neurosurgeon    Types: Snuff, Chew    Quit date: 02/27/1981  . Alcohol use Not on file  . Drug use: Unknown  . Sexual activity: Not on file   Other Topics Concern  . Not on file   Social History Narrative  . No narrative on file     Review of Systems    General:  No chills, fever, night sweats or weight changes.  Cardiovascular:  No edema, orthopnea, palpitations, paroxysmal nocturnal dyspnea. Positive for chest pain and dyspnea on exertion.  Dermatological: No rash, lesions/masses Respiratory: No cough, dyspnea Urologic: No hematuria, dysuria Abdominal:   No nausea, vomiting, diarrhea, bright red blood per rectum, melena, or hematemesis Neurologic:  No visual changes, wkns, changes in mental status. All other systems reviewed and are otherwise negative except as noted above.  Physical Exam    Blood pressure 120/68, pulse 74, temperature 98.4 F (36.9 C), temperature source Oral, resp. rate 18, height 5\' 11"  (1.803 m), weight 206 lb 12.8 oz (93.8 kg), SpO2 100 %.  General: Well developed, well nourished Caucasian male appearing in no acute distress. Head: Normocephalic, atraumatic, sclera non-icteric, no xanthomas, nares are without discharge.  Neck: No carotid bruits. JVD not elevated.  Lungs: Respirations regular and unlabored, without wheezes or rales.  Heart: Regular rate and rhythm. No S3 or S4.  No murmur, no rubs, or gallops appreciated. Abdomen: Soft, non-tender, non-distended with normoactive bowel sounds. No hepatomegaly. No rebound/guarding. No obvious abdominal masses. Msk:  Strength and tone appear normal for age. No joint deformities or  effusions. Extremities: No clubbing or cyanosis. No lower extremity edema.  Distal pedal pulses are 2+ bilaterally. Neuro: Alert and oriented X 3. Moves all extremities spontaneously. No focal deficits noted. Psych:  Responds to questions appropriately with a normal affect. Skin: No rashes or lesions noted  Labs    From [facility]:   09/18/2016: WBC of 12.4 Hgb 14.2 Platelets 249 Creatinine 1.60 K+ 4.2 ProBNP 104.4 D-dimer 0.72 Troponin < 0.01 PT 9.8 PTT 20.0 INR Ratio 1.0   Radiology Studies    No results found.  EKG & Cardiac Imaging    EKG:  NSR, HR 68, with slight ST elevation in lead II.  - Personally Reviewed  ECHOCARDIOGRAM: 06/2012 Study Conclusions  - Left ventricle: The cavity size was normal. Wall thickness was  increased in a pattern of mild LVH. Systolic function was normal. The estimated ejection fraction was in the range of 55% to 60%. Wall motion was normal; there were no regional wall motion abnormalities. Features are consistent with a pseudonormal left ventricular filling pattern, with concomitant abnormal relaxation and increased filling pressure (grade 2 diastolic dysfunction). - Aortic valve: There was no stenosis. - Mitral valve: Calcified annulus. Mildly thickened leaflets . Trivial regurgitation. - Left atrium: The atrium was mildly dilated. - Right ventricle: The cavity size was normal. Systolic function was normal. - Tricuspid valve: Peak RV-RA gradient: 26mm Hg (S). - Pulmonary arteries: PA peak pressure: 31mm Hg (S). - Inferior vena cava: The vessel was normal in size; the respirophasic diameter changes were in the normal range (= 50%); findings are consistent with normal central venous pressure. Impressions:  - Normal LV size and systolic function, EF 55-60%. Normal RV size and systolic function. No significant valvular abnormalities.  Assessment & Plan    1. Unstable Angina/ CAD - the  patient has known CAD s/p CABG in 2006 with LIMA-LAD, SVG-PDA, SVG-OM1, and SVG-D1. He presents with a 3-week history of chest pain and dyspnea on exertion occurring when walking up the hill in his backyard or push-mowing the grass. Developed chest pain last night which awoke him from sleep. Improved with IV NTG.   - Initial labs show WBC of 12.4, Hgb 14.2, platelets 249, creatinine 1.60, K+ 4.2, ProBNP 104.4, d-dimer 0.72, Troponin < 0.01. PT 9.8, PTT 20.0, INR Ratio 1.0 EKG shows NSR, HR 68, with slight ST elevation in lead II.  - discussed with Dr. Tenny Craw. Will plan for cardiac catheterization later today as his symptoms are consistent with unstable angina. Start IV Hydration. The patient understands that risks include but are not limited to stroke (1 in 1000), death (1 in 1000), kidney failure [usually temporary] (1 in 500), bleeding (1 in 200), allergic reaction [possibly serious] (1 in 200). Continue Heparin and IV NTG along with ASA, statin, and BB.    2. Carotid Artery Stenosis - dopplers on 7/19 showed stable 40-59% bilateral ICA stenosis. Repeat Dopplers in 1 year.  - continue ASA and statin therapy.   3. HTN - BP at 120/68 on most recent check. - continue Lopressor 12.5mg  BID. Will hold Lisinopril and Chlorthalidone for cath.   4. HLD - recheck FLP. - continue Atorvastatin 40mg  daily.    5. Stage 3 CKD - creatinine at 1.60 this AM (close to baseline).  - recheck BMET in AM. Hydrate for cath as above.  - will hold PTA Lisinopril and Chlorthalidone in anticipation of cath.   6. Elevated D-dimer - D-dimer slightly elevated at 0.72 when checked at [facility]. I cannot see where a CTA was performed there. Would defer CTA at this time with plans for cath to reduce the risk of contrast-induced nephropathy. If no significant abnormalities on cath, could consider VQ Scan.   Signed, Ellsworth Lennox, PA-C 09/18/2016, 8:38 AM Pager: [phone]  Patient seen and examined  I agree  with findings as noted by B Strader above  Pt with known CAD  Now with symptoms of progressive angina, including symtpoms that woke him from sleep Currently with minimal discomfort ON exam, pt in NAD   Neck:  JVP normal Lungs are clear to auscultation.  Cardiac RRR  NO significant murmurs  Abd benign  Ext without edema Initial trop neg  Repeat at 8 AM 1.91    WIll plan on cath later  today  Hydrate with hx of renal insufficeincy Pt understands and agrees.to proceed  Risks/benefits described  Agrees.  Dietrich Pates

## 2016-09-18 NOTE — Progress Notes (Signed)
Pt still with on going chest "tightness" since arriving this am. Unable to give patient pain free despite multiple titrations in nitro gtt, PRN morphine and increase in O2. 12 Lead EKG without changes. Pt currently still with 3/10 chest tightness. Pt offered PRN morphine again, however he refused stating that it "made him feel funny." Randall An, PA with cardiology on floor and made aware, she also reviewed EKG and agreed there were not changes from prior EKGs. Cath lab made aware of situation, patient scheduled for 3:00. Pt currently resting comfortably, in no acute distress, will continue to monitor closely.

## 2016-09-19 ENCOUNTER — Encounter (HOSPITAL_COMMUNITY): Payer: Self-pay | Admitting: Cardiovascular Disease

## 2016-09-19 DIAGNOSIS — I129 Hypertensive chronic kidney disease with stage 1 through stage 4 chronic kidney disease, or unspecified chronic kidney disease: Secondary | ICD-10-CM | POA: Diagnosis present

## 2016-09-19 DIAGNOSIS — I214 Non-ST elevation (NSTEMI) myocardial infarction: Secondary | ICD-10-CM | POA: Diagnosis present

## 2016-09-19 DIAGNOSIS — N183 Chronic kidney disease, stage 3 (moderate): Secondary | ICD-10-CM | POA: Diagnosis present

## 2016-09-19 DIAGNOSIS — I480 Paroxysmal atrial fibrillation: Secondary | ICD-10-CM | POA: Diagnosis present

## 2016-09-19 DIAGNOSIS — I2582 Chronic total occlusion of coronary artery: Secondary | ICD-10-CM | POA: Diagnosis present

## 2016-09-19 DIAGNOSIS — Z79899 Other long term (current) drug therapy: Secondary | ICD-10-CM | POA: Diagnosis not present

## 2016-09-19 DIAGNOSIS — Z8249 Family history of ischemic heart disease and other diseases of the circulatory system: Secondary | ICD-10-CM | POA: Diagnosis not present

## 2016-09-19 DIAGNOSIS — I25810 Atherosclerosis of coronary artery bypass graft(s) without angina pectoris: Secondary | ICD-10-CM | POA: Diagnosis present

## 2016-09-19 DIAGNOSIS — I25110 Atherosclerotic heart disease of native coronary artery with unstable angina pectoris: Secondary | ICD-10-CM | POA: Diagnosis present

## 2016-09-19 DIAGNOSIS — Z7982 Long term (current) use of aspirin: Secondary | ICD-10-CM | POA: Diagnosis not present

## 2016-09-19 DIAGNOSIS — Z951 Presence of aortocoronary bypass graft: Secondary | ICD-10-CM | POA: Diagnosis not present

## 2016-09-19 DIAGNOSIS — I200 Unstable angina: Secondary | ICD-10-CM | POA: Diagnosis not present

## 2016-09-19 DIAGNOSIS — Z87891 Personal history of nicotine dependence: Secondary | ICD-10-CM | POA: Diagnosis not present

## 2016-09-19 DIAGNOSIS — I4891 Unspecified atrial fibrillation: Secondary | ICD-10-CM | POA: Diagnosis not present

## 2016-09-19 DIAGNOSIS — I6523 Occlusion and stenosis of bilateral carotid arteries: Secondary | ICD-10-CM | POA: Diagnosis present

## 2016-09-19 DIAGNOSIS — E785 Hyperlipidemia, unspecified: Secondary | ICD-10-CM | POA: Diagnosis present

## 2016-09-19 DIAGNOSIS — Z888 Allergy status to other drugs, medicaments and biological substances status: Secondary | ICD-10-CM | POA: Diagnosis not present

## 2016-09-19 LAB — CBC
HEMATOCRIT: 39.4 % (ref 39.0–52.0)
Hemoglobin: 13.2 g/dL (ref 13.0–17.0)
MCH: 28.9 pg (ref 26.0–34.0)
MCHC: 33.5 g/dL (ref 30.0–36.0)
MCV: 86.4 fL (ref 78.0–100.0)
PLATELETS: 209 10*3/uL (ref 150–400)
RBC: 4.56 MIL/uL (ref 4.22–5.81)
RDW: 14.4 % (ref 11.5–15.5)
WBC: 17.5 10*3/uL — AB (ref 4.0–10.5)

## 2016-09-19 LAB — BASIC METABOLIC PANEL
Anion gap: 9 (ref 5–15)
BUN: 25 mg/dL — AB (ref 6–20)
CHLORIDE: 103 mmol/L (ref 101–111)
CO2: 23 mmol/L (ref 22–32)
CREATININE: 1.54 mg/dL — AB (ref 0.61–1.24)
Calcium: 9.1 mg/dL (ref 8.9–10.3)
GFR calc Af Amer: 51 mL/min — ABNORMAL LOW (ref >60)
GFR calc non Af Amer: 44 mL/min — ABNORMAL LOW (ref >60)
GLUCOSE: 158 mg/dL — AB (ref 65–99)
POTASSIUM: 4.2 mmol/L (ref 3.5–5.1)
SODIUM: 135 mmol/L (ref 135–145)

## 2016-09-19 LAB — MRSA PCR SCREENING: MRSA BY PCR: NEGATIVE

## 2016-09-19 MED ORDER — FUROSEMIDE 10 MG/ML IJ SOLN
INTRAMUSCULAR | Status: AC
  Administered 2016-09-19: 80 mg via INTRAVENOUS
  Filled 2016-09-19: qty 8

## 2016-09-19 MED ORDER — FUROSEMIDE 10 MG/ML IJ SOLN
80.0000 mg | Freq: Once | INTRAMUSCULAR | Status: AC
  Administered 2016-09-19: 80 mg via INTRAVENOUS

## 2016-09-19 NOTE — Progress Notes (Signed)
TR BAND REMOVAL  LOCATION:    left radial  DEFLATED PER PROTOCOL:    Yes.    TIME BAND OFF / DRESSING APPLIED:    0118   SITE UPON ARRIVAL:    Level 0  SITE AFTER BAND REMOVAL:    Level 0  CIRCULATION SENSATION AND MOVEMENT:    Within Normal Limits   Yes.    COMMENTS:

## 2016-09-19 NOTE — Progress Notes (Signed)
Progress Note  Patient Name: William Hatfield Date of Encounter: 09/19/2016  Primary Cardiologist: Purvis Sheffield  Subjective   No CP  No SOB    Inpatient Medications    Scheduled Meds: . aspirin EC  81 mg Oral Daily  . atorvastatin  40 mg Oral QHS  . clopidogrel  75 mg Oral Q breakfast  . metoprolol tartrate  12.5 mg Oral BID  . sodium chloride flush  3 mL Intravenous Q12H   Continuous Infusions: . sodium chloride     PRN Meds: sodium chloride, acetaminophen, morphine injection, nitroGLYCERIN, ondansetron (ZOFRAN) IV, sodium chloride flush   Vital Signs    Vitals:   09/19/16 0600 09/19/16 0707 09/19/16 0956 09/19/16 1157  BP: (!) 137/58 (!) 149/67 (!) 142/56 (!) 143/65  Pulse: 86 95 90 82  Resp: (!) 30 (!) 30  18  Temp:  (!) 100.6 F (38.1 C) 99.8 F (37.7 C) 99 F (37.2 C)  TempSrc:  Oral Oral Axillary  SpO2: 99% 98%  100%  Weight: 208 lb 15.9 oz (94.8 kg)     Height: 5\' 11"  (1.803 m)       Intake/Output Summary (Last 24 hours) at 09/19/16 1245 Last data filed at 09/19/16 1200  Gross per 24 hour  Intake          1065.88 ml  Output             2125 ml  Net         -1059.12 ml   Filed Weights   09/18/16 0655 09/18/16 1947 09/19/16 0600  Weight: 206 lb 12.8 oz (93.8 kg) 215 lb 9.8 oz (97.8 kg) 208 lb 15.9 oz (94.8 kg)    Telemetry    SR    - Personally Reviewed  ECG    Physical Exam   GEN: No acute distress.   Neck: No JVD Cardiac: RRR, no murmurs, rubs, or gallops.  Respiratory: Clear to auscultation bilaterally. GI: Soft, nontender, non-distended  MS: No edema; No deformity. Neuro:  Nonfocal  Psych: Normal affect   Labs    Chemistry Recent Labs Lab 09/18/16 1139 09/19/16 0319  NA 136 135  K 4.3 4.2  CL 105 103  CO2 22 23  GLUCOSE 135* 158*  BUN 31* 25*  CREATININE 1.63* 1.54*  CALCIUM 9.2 9.1  GFRNONAA 41* 44*  GFRAA 48* 51*  ANIONGAP 9 9     Hematology Recent Labs Lab 09/18/16 1139 09/19/16 0319  WBC 11.2* 17.5*  RBC  4.56 4.56  HGB 13.2 13.2  HCT 39.1 39.4  MCV 85.7 86.4  MCH 28.9 28.9  MCHC 33.8 33.5  RDW 14.5 14.4  PLT 219 209    Cardiac Enzymes Recent Labs Lab 09/18/16 0810 09/18/16 1347 09/18/16 2019  TROPONINI 1.91* 13.83* 18.12*   No results for input(s): TROPIPOC in the last 168 hours.   BNPNo results for input(s): BNP, PROBNP in the last 168 hours.   DDimer No results for input(s): DDIMER in the last 168 hours.   Radiology    No results found.  Cardiac Studies   Cath:  7/23 Conclusion   1. Severe native three-vessel coronary artery disease with diffuse nonobstructive stenosis of the RCA, severe stenosis of the PDA origin and PLA origin, and continued patency of the saphenous vein graft to PLA branch 2. Total occlusion of the LAD with continued patency of the LIMA to LAD and saphenous vein graft to diagonal 3. Total occlusion of the left circumflex with total occlusion  of the saphenous vein graft OM 4. Unsuccessful PCI of the saphenous vein graft to OM despite its extensive balloon angioplasty with inability to restore flow 5. Elevated LVEDP   Recommendations: Medical therapy for CAD/non-STEMI. Will treat him with Plavix      Patient Profile     71 y.o.   Assessment & Plan    1  NSTEMI  CAD   Peak trop 18    Pt with diffuse dz  INtervention attempts yesterday unsuccessful Plan for Plavix  Continue medical care LVEDP was elevate  Will give lasix today Follow BP , urine output and Cr  2  HTN  BP is OK    3  HL    4  Chronic kidney dz   Follow after lasix x 1      Signed, Dietrich Pates, MD  09/19/2016, 12:45 PM

## 2016-09-19 NOTE — Progress Notes (Signed)
CARDIAC REHAB PHASE I   PRE:  Rate/Rhythm: 95 SR  BP:  Supine:   Sitting: 139/76  Standing:    SaO2: 98%RA  MODE:  Ambulation: 400 ft   POST:  Rate/Rhythm: 101ST  BP:  Supine:   Sitting: 147/68  Standing:    SaO2: 99%RA 1435-1515 Pt walked 400 ft with steady gait and no CP. MI education completed with pt and family who voiced understanding. Reviewed importance of plavix. Reviewed MI restrictions, NTG use, heart healthy diet, risk factors and ex ed. He has done CRP 2 before. Referring to [facility] Phase 2.   Luetta Nutting, RN BSN  09/19/2016 3:14 PM

## 2016-09-19 NOTE — Progress Notes (Deleted)
Patient discussing PICC line, she is very nervous of needles and wishes to be sedated as much as possible. Instructed on  risks and benefits, respiratory depression etc with sedation. Family wishes to be in attendance or closeby during procedure. It is not ordered or scheduled as of yet.

## 2016-09-20 ENCOUNTER — Inpatient Hospital Stay (HOSPITAL_COMMUNITY): Payer: Medicare Other

## 2016-09-20 ENCOUNTER — Encounter (HOSPITAL_COMMUNITY): Payer: Self-pay | Admitting: Physician Assistant

## 2016-09-20 ENCOUNTER — Other Ambulatory Visit: Payer: Self-pay | Admitting: Physician Assistant

## 2016-09-20 ENCOUNTER — Telehealth: Payer: Self-pay | Admitting: Cardiovascular Disease

## 2016-09-20 DIAGNOSIS — I214 Non-ST elevation (NSTEMI) myocardial infarction: Secondary | ICD-10-CM

## 2016-09-20 DIAGNOSIS — I4891 Unspecified atrial fibrillation: Secondary | ICD-10-CM

## 2016-09-20 DIAGNOSIS — I480 Paroxysmal atrial fibrillation: Secondary | ICD-10-CM

## 2016-09-20 DIAGNOSIS — N189 Chronic kidney disease, unspecified: Secondary | ICD-10-CM

## 2016-09-20 LAB — CBC WITH DIFFERENTIAL/PLATELET
BASOS ABS: 0 10*3/uL (ref 0.0–0.1)
BLASTS: 0 %
Band Neutrophils: 0 %
Basophils Relative: 0 %
EOS ABS: 0 10*3/uL (ref 0.0–0.7)
Eosinophils Relative: 0 %
HEMATOCRIT: 43.1 % (ref 39.0–52.0)
HEMOGLOBIN: 14.3 g/dL (ref 13.0–17.0)
LYMPHS PCT: 4 %
Lymphs Abs: 0.8 10*3/uL (ref 0.7–4.0)
MCH: 28.3 pg (ref 26.0–34.0)
MCHC: 33.2 g/dL (ref 30.0–36.0)
MCV: 85.3 fL (ref 78.0–100.0)
METAMYELOCYTES PCT: 0 %
MONOS PCT: 11 %
Monocytes Absolute: 2.2 10*3/uL — ABNORMAL HIGH (ref 0.1–1.0)
Myelocytes: 0 %
NEUTROS ABS: 16.9 10*3/uL — AB (ref 1.7–7.7)
Neutrophils Relative %: 85 %
Other: 0 %
PROMYELOCYTES ABS: 0 %
Platelets: 213 10*3/uL (ref 150–400)
RBC: 5.05 MIL/uL (ref 4.22–5.81)
RDW: 14.8 % (ref 11.5–15.5)
SMEAR REVIEW: ADEQUATE
WBC: 19.9 10*3/uL — AB (ref 4.0–10.5)
nRBC: 0 /100 WBC

## 2016-09-20 LAB — ECHOCARDIOGRAM COMPLETE
HEIGHTINCHES: 71 in
WEIGHTICAEL: 3225.6 [oz_av]

## 2016-09-20 LAB — BASIC METABOLIC PANEL
ANION GAP: 10 (ref 5–15)
BUN: 32 mg/dL — AB (ref 6–20)
CALCIUM: 9 mg/dL (ref 8.9–10.3)
CO2: 23 mmol/L (ref 22–32)
Chloride: 100 mmol/L — ABNORMAL LOW (ref 101–111)
Creatinine, Ser: 1.68 mg/dL — ABNORMAL HIGH (ref 0.61–1.24)
GFR calc Af Amer: 46 mL/min — ABNORMAL LOW (ref >60)
GFR, EST NON AFRICAN AMERICAN: 40 mL/min — AB (ref >60)
GLUCOSE: 187 mg/dL — AB (ref 65–99)
POTASSIUM: 3.7 mmol/L (ref 3.5–5.1)
SODIUM: 133 mmol/L — AB (ref 135–145)

## 2016-09-20 LAB — URINALYSIS, ROUTINE W REFLEX MICROSCOPIC
BILIRUBIN URINE: NEGATIVE
Glucose, UA: NEGATIVE mg/dL
Hgb urine dipstick: NEGATIVE
Ketones, ur: NEGATIVE mg/dL
Leukocytes, UA: NEGATIVE
NITRITE: NEGATIVE
PH: 5 (ref 5.0–8.0)
Protein, ur: 30 mg/dL — AB
Specific Gravity, Urine: 1.018 (ref 1.005–1.030)
Squamous Epithelial / LPF: NONE SEEN

## 2016-09-20 LAB — CBC
HCT: 42.5 % (ref 39.0–52.0)
HEMOGLOBIN: 14.6 g/dL (ref 13.0–17.0)
MCH: 28.9 pg (ref 26.0–34.0)
MCHC: 34.4 g/dL (ref 30.0–36.0)
MCV: 84.2 fL (ref 78.0–100.0)
Platelets: 222 10*3/uL (ref 150–400)
RBC: 5.05 MIL/uL (ref 4.22–5.81)
RDW: 14.4 % (ref 11.5–15.5)
WBC: 22.7 10*3/uL — ABNORMAL HIGH (ref 4.0–10.5)

## 2016-09-20 LAB — MAGNESIUM: MAGNESIUM: 1.9 mg/dL (ref 1.7–2.4)

## 2016-09-20 LAB — POTASSIUM: POTASSIUM: 3.8 mmol/L (ref 3.5–5.1)

## 2016-09-20 LAB — TSH: TSH: 0.361 u[IU]/mL (ref 0.350–4.500)

## 2016-09-20 MED ORDER — APIXABAN 5 MG PO TABS: 5.0000 mg | ORAL_TABLET | Freq: Two times a day (BID) | ORAL | 6 refills | Status: DC

## 2016-09-20 MED ORDER — DILTIAZEM HCL 100 MG IV SOLR
5.0000 mg/h | INTRAVENOUS | Status: DC
  Administered 2016-09-20: 5 mg/h via INTRAVENOUS
  Administered 2016-09-20: 10 mg/h via INTRAVENOUS

## 2016-09-20 MED ORDER — CLOPIDOGREL BISULFATE 75 MG PO TABS: 75.0000 mg | ORAL_TABLET | Freq: Every day | ORAL | 1 refills | Status: DC

## 2016-09-20 MED ORDER — DILTIAZEM HCL 100 MG IV SOLR
INTRAVENOUS | Status: AC
  Administered 2016-09-20: 10 mg/h via INTRAVENOUS
  Filled 2016-09-20: qty 100

## 2016-09-20 MED ORDER — NITROGLYCERIN 0.4 MG SL SUBL: 0.4000 mg | SUBLINGUAL_TABLET | SUBLINGUAL | 0 refills | Status: DC | PRN

## 2016-09-20 MED ORDER — DILTIAZEM LOAD VIA INFUSION
20.0000 mg | Freq: Once | INTRAVENOUS | Status: AC
  Administered 2016-09-20: 20 mg via INTRAVENOUS
  Filled 2016-09-20: qty 20

## 2016-09-20 MED ORDER — CLOPIDOGREL BISULFATE 75 MG PO TABS: 75.0000 mg | ORAL_TABLET | Freq: Every day | ORAL | 0 refills | Status: DC

## 2016-09-20 MED ORDER — APIXABAN 5 MG PO TABS: 5.0000 mg | ORAL_TABLET | Freq: Two times a day (BID) | ORAL | 0 refills | Status: DC

## 2016-09-20 MED ORDER — NITROGLYCERIN 0.4 MG SL SUBL: 0.4000 mg | SUBLINGUAL_TABLET | SUBLINGUAL | 12 refills | Status: DC | PRN

## 2016-09-20 MED ORDER — METOPROLOL TARTRATE 25 MG PO TABS: 50.0000 mg | ORAL_TABLET | Freq: Two times a day (BID) | ORAL | 6 refills | Status: DC

## 2016-09-20 MED ORDER — METOPROLOL TARTRATE 50 MG PO TABS: 50.0000 mg | ORAL_TABLET | Freq: Two times a day (BID) | ORAL | 6 refills | Status: DC

## 2016-09-20 MED ORDER — APIXABAN 5 MG PO TABS: 5.0000 mg | ORAL_TABLET | Freq: Two times a day (BID) | ORAL | Status: DC

## 2016-09-20 MED ORDER — METOPROLOL TARTRATE 50 MG PO TABS
50.0000 mg | ORAL_TABLET | Freq: Two times a day (BID) | ORAL | Status: DC
  Administered 2016-09-20: 50 mg via ORAL
  Filled 2016-09-20: qty 1

## 2016-09-20 NOTE — Discharge Summary (Signed)
Discharge Summary    Patient ID: William Hatfield,  MRN: [id number], DOB/AGE: October 17, 1945 71 y.o.  Admit date: 09/18/2016 Discharge date: 09/20/2016  Primary Care Provider: Lorelei Pont Primary Cardiologist: Dr. Purvis Sheffield    Discharge Diagnoses    Principal Problem:   Non-ST elevation (NSTEMI) myocardial infarction [facility]) Active Problems:   Hypertension   CAD (coronary artery disease)   Hx of CABG   Carotid artery disease (HCC)   Dyslipidemia   PAF (paroxysmal atrial fibrillation) (HCC)   CKD (chronic kidney disease)   Allergies Allergies  Allergen Reactions  . Niaspan [Niacin] Hives and Other (See Comments)    NIASPAN ONLY/REACTION: Itching, whelps     History of Present Illness     William Hatfield is a 71 y.o. male with past medical history of CAD (s/p CABG in 2006 with LIMA-LAD, SVG-PDA, SVG-OM1, and SVG-D1), carotid artery stenosis, HTN, HLD, Stage 3 CKD and PAC's/PVC's who presents to [facility] on 09/18/2016 as a transfer from [facility] for evaluation of chest pain.   He presented to [facility] with a 3-week history of chest pain and dyspnea on exertion occurring when walking up the hill in his backyard or push-mowing the grass. He then developed chest painwhich awoke him from sleep and improved with IV NTG, prompting him to see care at [facility]. Initial labs showed WBC of 12.4, Hgb 14.2, platelets 249, creatinine 1.60, K+ 4.2, ProBNP 104.4, d-dimer 0.72, Troponin < 0.01. PT 9.8, PTT 20.0, INR Ratio 1.0 EKG shows NSR, HR 68, with slight ST elevation in lead II. He was then transferred to [facility] on 09/18/16 for unstable angina.   Hospital Course     Consultants: none  NSTEMI/Multivessel CAD: he eventually ruled in for MI; troponin 1.91--> 13.82--> 18.12. He underwent LHC on 09/18/16 which showed severe native 3V CAD with patent SVG-->PLA, LIMA-->LAD and total occlusion of SVG--> OM1 with unsuccessful attempt at PCI of SVG--> OM with inability to restore  flow. LVEDP was elevated and he was treated with IV lasix. Plan was for medical therapy and he was placed on ASA and plavix, however, ASA was later discontinued when he was found to have atrial fibrillation and put on Eliquis. Continue BB and statin   Newly diagnosed atrial fibrillation: he did have post op afib in 2006 after bypass with no recurrence. He was noted to go into atrial fibrillation with RVR that was treated with IV dilt. Now with good rate control on Lopressor 50mg  BID. 2D ECHO showed normal LV function. CHADSVASC score of at least 3 (HTN, age, vasc dz). He has been placed on Eliquis 5mg  BID which is the appropriate dose based on age and weight and renal function   Carotid artery stenosis: dopplers on 09/14/16 showed stable 40-59% bilateral ICA stenosis. Repeat Dopplers in 1 year.   HTN: BP well controlled currently. Lopressor increased from 12.5mg  BID to 50mg  BID given afib with RVR. Will discontinue home lisinopril 40mg  daily at discharge. This can be added back as an outpatient if necessary for better BP control. I will resume home Chlorthalidone 25mg  daily at discharge given elevated LVEDP on cath. BMET planned for 1 week.   HLD: continue atorva 40mg  daily.   CKD stage III: creat 1.68 at discharge. Will get follow up BMET next week.   Leukocytosis: WBC elevated though less than yesterday (22.7--> 19.9). PA and Lat CXR  ? Lingular infiltrate vs atelectasis. UA with rare bacteria. Plan for continued observation and will  arrange for a CBC next week at [facility] lab across from [facility]    The patient has had an uncomplicated hospital course and is recovering well. The radial catheter site is stable. He has been seen by Dr. Tenny Craw today and deemed ready for discharge home. All follow-up appointments have been scheduled. Discharge medications are listed below.  _____________  Discharge Vitals Blood pressure (!) 116/59, pulse 86, temperature (!) 96.2 F (35.7 C), temperature  source Axillary, resp. rate (!) 24, height 5\' 11"  (1.803 m), weight 201 lb 9.6 oz (91.4 kg), SpO2 94 %.  Filed Weights   09/18/16 1947 09/19/16 0600 09/20/16 0540  Weight: 215 lb 9.8 oz (97.8 kg) 208 lb 15.9 oz (94.8 kg) 201 lb 9.6 oz (91.4 kg)    Labs & Radiologic Studies     CBC  Recent Labs  09/20/16 0535 09/20/16 0919  WBC 22.7* 19.9*  NEUTROABS  --  16.9*  HGB 14.6 14.3  HCT 42.5 43.1  MCV 84.2 85.3  PLT 222 213   Basic Metabolic Panel  Recent Labs  09/19/16 0319 09/20/16 0545 09/20/16 0919  NA 135  --  133*  K 4.2 3.8 3.7  CL 103  --  100*  CO2 23  --  23  GLUCOSE 158*  --  187*  BUN 25*  --  32*  CREATININE 1.54*  --  1.68*  CALCIUM 9.1  --  9.0  MG  --  1.9  --    Liver Function Tests No results for input(s): AST, ALT, ALKPHOS, BILITOT, PROT, ALBUMIN in the last 72 hours. No results for input(s): LIPASE, AMYLASE in the last 72 hours. Cardiac Enzymes  Recent Labs  09/18/16 0810 09/18/16 1347 09/18/16 2019  TROPONINI 1.91* 13.83* 18.12*   BNP Invalid input(s): POCBNP D-Dimer No results for input(s): DDIMER in the last 72 hours. Hemoglobin A1C No results for input(s): HGBA1C in the last 72 hours. Fasting Lipid Panel  Recent Labs  09/18/16 0810  CHOL 117  HDL 29*  LDLCALC 77  TRIG 56  CHOLHDL 4.0   Thyroid Function Tests  Recent Labs  09/20/16 0919  TSH 0.361    Dg Chest 2 View  Result Date: 09/20/2016 CLINICAL DATA:  Dyspnea, recent heart attack EXAM: CHEST  2 VIEW COMPARISON:  09/18/2016 FINDINGS: There is hazy lingular airspace disease which may reflect atelectasis versus pneumonia. There is no pleural effusion or pneumothorax. The heart and mediastinal contours are unremarkable. There is evidence of prior CABG. The osseous structures are unremarkable. IMPRESSION: Hazy lingular airspace disease which may reflect atelectasis versus pneumonia. Followup PA and lateral chest X-ray is recommended in 3-4 weeks following trial of  antibiotic therapy to ensure resolution and exclude underlying malignancy. Electronically Signed   By: Elige Ko   On: 09/20/2016 12:47     Diagnostic Studies/Procedures   Cath:  09/18/16 Conclusion   1. Severe native three-vessel coronary artery disease with diffuse nonobstructive stenosis of the RCA, severe stenosis of the PDA origin and PLA origin, and continued patency of the saphenous vein graft to PLA branch 2. Total occlusion of the LAD with continued patency of the LIMA to LAD and saphenous vein graft to diagonal 3. Total occlusion of the left circumflex with total occlusion of the saphenous vein graft OM 4. Unsuccessful PCI of the saphenous vein graft to OM despite its extensive balloon angioplasty with inability to restore flow 5. Elevated LVEDP   Recommendations: Medical therapy for CAD/non-STEMI. Will treat him with  Plavix    _____________  2D ECHO: 09/20/2016 LV EF: 60% -   65% Study Conclusions - Left ventricle: The cavity size was normal. There was mild   concentric hypertrophy. Systolic function was normal. The   estimated ejection fraction was in the range of 60% to 65%. Wall   motion was normal; there were no regional wall motion   abnormalities. - Aortic valve: Trileaflet; normal thickness, mildly calcified   leaflets. - Mitral valve: There was trivial regurgitation. - Atrial septum: There was increased thickness of the septum,   consistent with lipomatous hypertrophy. - Pulmonary arteries: Systolic pressure could not be accurately   estimated.  Disposition   Pt is being discharged home today in good condition.  Follow-up Plans & Appointments    Follow-up Information    Laqueta Linden, MD. Go on 09/26/2016.   Specialty:  Cardiology Why:  @ 9:20 am Contact information: [address] [place] [address] [phone]        [facility]. Go on 09/27/2016.   Why:  You will need to get a CBC and BMET drawn (orders have been placed  through epic) Contact information: [address] [place], [place] [address]         Discharge Instructions    Amb Referral to Cardiac Rehabilitation    Complete by:  As directed    Referring to [facility] Phase 2   Diagnosis:  NSTEMI      Discharge Medications     Medication List    STOP taking these medications   aspirin EC 81 MG tablet   lisinopril 40 MG tablet Commonly known as:  PRINIVIL,ZESTRIL     TAKE these medications   apixaban 5 MG Tabs tablet Commonly known as:  ELIQUIS Take 1 tablet (5 mg total) by mouth 2 (two) times daily.   atorvastatin 40 MG tablet Commonly known as:  LIPITOR Take 1 tablet (40 mg total) by mouth every evening.   chlorthalidone 25 MG tablet Commonly known as:  HYGROTON Take 1 tablet (25 mg total) by mouth daily.   Cinnamon 500 MG Tabs Take 500 mg by mouth daily.   clopidogrel 75 MG tablet Commonly known as:  PLAVIX Take 1 tablet (75 mg total) by mouth daily with breakfast.   cyanocobalamin 500 MCG tablet Take 500 mcg by mouth daily.   metoprolol tartrate 50 MG tablet Commonly known as:  LOPRESSOR Take 1 tablet (50 mg total) by mouth 2 (two) times daily. What changed:  medication strength  how much to take   multivitamin tablet Take 1 tablet by mouth daily.   nitroGLYCERIN 0.4 MG SL tablet Commonly known as:  NITROSTAT Place 1 tablet (0.4 mg total) under the tongue every 5 (five) minutes x 3 doses as needed for chest pain.   Potassium 99 MG Tabs Take 99 mg by mouth daily.       Aspirin prescribed at discharge?  Yes High Intensity Statin Prescribed? (Lipitor 40-80mg  or Crestor 20-40mg ): Yes Beta Blocker Prescribed? Yes For EF 45% or less, Was ACEI/ARB Prescribed? No: EF normal  ADP Receptor Inhibitor Prescribed? (i.e. Plavix etc.-Includes Medically Managed Patients): Yes For EF <45%, Aldosterone Inhibitor Prescribed? No: EF normal  Was EF assessed during THIS hospitalization? Yes Was Cardiac Rehab II  ordered? (Included Medically managed Patients): Yes   Outstanding Labs/Studies   BMET, CBC  Duration of Discharge Encounter   Greater than 30 minutes including physician time.  Signed, Cline Crock PA-C 09/20/2016,  2:54 PM

## 2016-09-20 NOTE — Progress Notes (Signed)
  Echocardiogram 2D Echocardiogram has been performed.  William Hatfield 09/20/2016, 10:08 AM

## 2016-09-20 NOTE — Care Management Note (Signed)
Case Management Note  Patient Details  Name: Kush Lee Hockley MRN: [id number] Date of Birth: 06/05/1945  Subjective/Objective:    Pt presented for  Unstable Angina. Pt is from home with support of wife. Plan to return home today on Eliquis. CM did provide pt  With 30 day free card. Pt has [employer] Part D Coverage and pt has not met deductible.                 Action/Plan: Pt is aware of deductible he has to meet. Pt to call Insurance Co in regards to co pay for next 30 days. No further needs from CM at this time.   Expected Discharge Date:  09/20/16               Expected Discharge Plan:  Home/Self Care  In-House Referral:  NA  Discharge planning Services  CM Consult, Medication Assistance  Post Acute Care Choice:  NA Choice offered to:  NA  DME Arranged:  N/A DME Agency:  NA  HH Arranged:  NA HH Agency:  NA  Status of Service:  Completed, signed off  If discussed at Long Length of Stay Meetings, dates discussed:    Additional Comments:  Graves-Bigelow, Brenda Kaye, RN 09/20/2016, 2:59 PM  

## 2016-09-20 NOTE — Progress Notes (Signed)
Awoke patient for vitals and lab, paitent went into a atrial bigeminy all of a sudden when he sat up. Then proceeded to rapid afib. Valsalva maneuvers done x 2 not symptomatic, no dizziness, light headed , etc.  Wife at bedside, informed paitent and wife of treatments, called MD cardiology. Orders reced. Started cardizem drip, 20 mg bolus then 5 mg hour. Increased to 10, hr slowing, continues to with no symptoms. Daughter updated. Labs drawn added K+ and magnesium levels.  Will continue to monitor.

## 2016-09-20 NOTE — Progress Notes (Signed)
CARDIAC REHAB PHASE I   PRE:  Rate/Rhythm: 97 SR  BP:  Supine:   Sitting: 108/58  Standing:    SaO2: 97%RA  MODE:  Ambulation: 400 ft   POST:  Rate/Rhythm: 110 ST  BP:  Supine:   Sitting: 111/72  Standing:    SaO2: 99%RA 1015-1037 Pt walked 400 ft on RA with steady gait. No CP. Remained in NSR.   Luetta Nutting, RN BSN  09/20/2016 10:35 AM

## 2016-09-20 NOTE — Discharge Instructions (Signed)

## 2016-09-20 NOTE — Telephone Encounter (Signed)
TOC  Patient going home from hospital alter today Schedule to see Dr Kirtland Bouchard on Oct 03, 2016

## 2016-09-20 NOTE — Progress Notes (Signed)
pt discharged per MD order, all discharge instructions reviwed and all questions answered.

## 2016-09-20 NOTE — Progress Notes (Signed)
WBC elevated though sl less than yesterday  PA and Lat CXR  ? Lingular infiltrate vs atelectasis. UA pending   Pt denies CP  Breathing is good  No SOB  No cough No fevers, chills  I would treat clnically and observe Close f/u as outpt   I am not convinced of an infection  Echo showed normal LVEF   Episode of afib with RVR this am  Pt asymptomatic  No identifiable trigger  I would Rx with NOAC  Will have pharmacy input on dosing   D/C home today with close f/u in clinic in 1 wk ([place])

## 2016-09-20 NOTE — Progress Notes (Signed)
Progress Note  Patient Name: William Hatfield Date of Encounter: 09/20/2016  Primary Cardiologist: Purvis Sheffield    Subjective   Breathing is OK  No CP  Did not sense any palpitations    Inpatient Medications    Scheduled Meds: . aspirin EC  81 mg Oral Daily  . atorvastatin  40 mg Oral QHS  . clopidogrel  75 mg Oral Q breakfast  . metoprolol tartrate  12.5 mg Oral BID  . sodium chloride flush  3 mL Intravenous Q12H   Continuous Infusions: . sodium chloride    . diltiazem (CARDIZEM) infusion 10 mg/hr (09/20/16 0700)   PRN Meds: sodium chloride, acetaminophen, morphine injection, nitroGLYCERIN, ondansetron (ZOFRAN) IV, sodium chloride flush   Vital Signs    Vitals:   09/19/16 2200 09/20/16 0540 09/20/16 0600 09/20/16 0721  BP:  94/70  (!) 116/59  Pulse: 95 99 (!) 146 86  Resp: (!) 27 (!) 29 (!) 28 (!) 24  Temp:  (!) 100.5 F (38.1 C)    TempSrc:  Oral    SpO2: 100% 96% 96% 94%  Weight:  201 lb 9.6 oz (91.4 kg)    Height:        Intake/Output Summary (Last 24 hours) at 09/20/16 0865 Last data filed at 09/20/16 0700  Gross per 24 hour  Intake              673 ml  Output             3425 ml  Net            -2752 ml   Filed Weights   09/18/16 1947 09/19/16 0600 09/20/16 0540  Weight: 215 lb 9.8 oz (97.8 kg) 208 lb 15.9 oz (94.8 kg) 201 lb 9.6 oz (91.4 kg)    Telemetry    Atrial fib with RVR  (5:30 AM to 8 AM)  Now SR  - Personally Reviewed  ECG      Physical Exam  Pt comfortable  GEN: No acute distress.   Neck: No JVD Cardiac: RRR, no murmurs, rubs, or gallops.  Respiratory: Clear to auscultation bilaterally. GI: Soft, nontender, non-distended  MS: No edema; No deformity. Neuro:  Nonfocal  Psych: Normal affect   Labs    Chemistry Recent Labs Lab 09/18/16 1139 09/19/16 0319 09/20/16 0545  NA 136 135  --   K 4.3 4.2 3.8  CL 105 103  --   CO2 22 23  --   GLUCOSE 135* 158*  --   BUN 31* 25*  --   CREATININE 1.63* 1.54*  --   CALCIUM 9.2  9.1  --   GFRNONAA 41* 44*  --   GFRAA 48* 51*  --   ANIONGAP 9 9  --      Hematology Recent Labs Lab 09/18/16 1139 09/19/16 0319 09/20/16 0535  WBC 11.2* 17.5* 22.7*  RBC 4.56 4.56 5.05  HGB 13.2 13.2 14.6  HCT 39.1 39.4 42.5  MCV 85.7 86.4 84.2  MCH 28.9 28.9 28.9  MCHC 33.8 33.5 34.4  RDW 14.5 14.4 14.4  PLT 219 209 222    Cardiac Enzymes Recent Labs Lab 09/18/16 0810 09/18/16 1347 09/18/16 2019  TROPONINI 1.91* 13.83* 18.12*   No results for input(s): TROPIPOC in the last 168 hours.   BNPNo results for input(s): BNP, PROBNP in the last 168 hours.   DDimer No results for input(s): DDIMER in the last 168 hours.   Radiology    No results found.  Cardiac Studies     Patient Profile       Assessment & Plan    1  Atrial fib  Pt asymptomatic  Dilt started  K 3.8  Mg normal   May be in aond out and not know it.   Echo pending  Will place on anticoagulation  Continue Plavix  D/c ASA    2 CAD  Pt with NSTEMI  Cath a couple days ago with closure of   3  HTN  BP is OK    4  HL  COntinue lipitor    5  CKD  BMET pending   6  Heme  WBC 22 yesterday  CLinically no evid of infection  Repeat today      Signed, Dietrich Pates, MD  09/20/2016, 8:22 AM

## 2016-09-21 ENCOUNTER — Telehealth: Payer: Self-pay | Admitting: *Deleted

## 2016-09-21 NOTE — Telephone Encounter (Signed)
Pt says he is a little weak today and some SOB that is getting better - says he feels much better than Sunday - denies chest pain/dizziness- reviewed discharge medication changes - pt did not have any further questions or concerns. Will f/u with Dr Purvis Sheffield 8/7

## 2016-09-21 NOTE — Telephone Encounter (Signed)
Pt aware - routed to pcp  

## 2016-09-21 NOTE — Telephone Encounter (Signed)
-----   Message from Antoine Poche, MD sent at 09/20/2016  1:44 PM EDT ----- Stable moderate blockages by carotid US,will continue to monitor. Reviewed while Dr Kirtland Bouchard is out  J BrancH MD

## 2016-09-27 ENCOUNTER — Telehealth: Payer: Self-pay | Admitting: *Deleted

## 2016-09-27 NOTE — Telephone Encounter (Signed)
Notes recorded by Lesle Chris, LPN on 07/30/3352 at 2:19 PM EDT Patient notified. Copy to pmd. Follow up already scheduled for 10/03/2016. ------  Notes recorded by Laqueta Linden, MD on 09/26/2016 at 5:05 PM EDT normal

## 2016-10-03 ENCOUNTER — Encounter: Payer: Self-pay | Admitting: Cardiovascular Disease

## 2016-10-03 ENCOUNTER — Ambulatory Visit (INDEPENDENT_AMBULATORY_CARE_PROVIDER_SITE_OTHER): Payer: Medicare Other | Admitting: Cardiovascular Disease

## 2016-10-03 VITALS — BP 130/78 | HR 68 | Ht 71.0 in | Wt 201.0 lb

## 2016-10-03 DIAGNOSIS — E785 Hyperlipidemia, unspecified: Secondary | ICD-10-CM | POA: Diagnosis not present

## 2016-10-03 DIAGNOSIS — D72829 Elevated white blood cell count, unspecified: Secondary | ICD-10-CM

## 2016-10-03 DIAGNOSIS — I6523 Occlusion and stenosis of bilateral carotid arteries: Secondary | ICD-10-CM

## 2016-10-03 DIAGNOSIS — Z9289 Personal history of other medical treatment: Secondary | ICD-10-CM | POA: Diagnosis not present

## 2016-10-03 DIAGNOSIS — I25708 Atherosclerosis of coronary artery bypass graft(s), unspecified, with other forms of angina pectoris: Secondary | ICD-10-CM

## 2016-10-03 DIAGNOSIS — N189 Chronic kidney disease, unspecified: Secondary | ICD-10-CM

## 2016-10-03 DIAGNOSIS — I214 Non-ST elevation (NSTEMI) myocardial infarction: Secondary | ICD-10-CM | POA: Diagnosis not present

## 2016-10-03 DIAGNOSIS — I480 Paroxysmal atrial fibrillation: Secondary | ICD-10-CM

## 2016-10-03 DIAGNOSIS — I10 Essential (primary) hypertension: Secondary | ICD-10-CM | POA: Diagnosis not present

## 2016-10-03 LAB — CBC
HCT: 40.6 % (ref 38.5–50.0)
Hemoglobin: 13.6 g/dL (ref 13.2–17.1)
MCH: 28.7 pg (ref 27.0–33.0)
MCHC: 33.5 g/dL (ref 32.0–36.0)
MCV: 85.7 fL (ref 80.0–100.0)
MPV: 9.5 fL (ref 7.5–12.5)
PLATELETS: 449 10*3/uL — AB (ref 140–400)
RBC: 4.74 MIL/uL (ref 4.20–5.80)
RDW: 13.8 % (ref 11.0–15.0)
WBC: 9.1 10*3/uL (ref 3.8–10.8)

## 2016-10-03 LAB — BASIC METABOLIC PANEL
BUN: 32 mg/dL — ABNORMAL HIGH (ref 7–25)
CALCIUM: 10 mg/dL (ref 8.6–10.3)
CO2: 28 mmol/L (ref 20–32)
Chloride: 102 mmol/L (ref 98–110)
Creat: 1.38 mg/dL — ABNORMAL HIGH (ref 0.70–1.25)
Glucose, Bld: 102 mg/dL — ABNORMAL HIGH (ref 65–99)
POTASSIUM: 5.3 mmol/L (ref 3.5–5.3)
SODIUM: 139 mmol/L (ref 135–146)

## 2016-10-03 NOTE — Patient Instructions (Addendum)
Medication Instructions:   Continue all current medications.  Eliquis 5mg  samples provided today.   Labwork: Please do labs previously requested at hospital discharge (CBC, BMET).  Testing/Procedures: none  Follow-Up: 3 months   Any Other Special Instructions Will Be Listed Below (If Applicable). You have been referred to:  Cardiac rehab.   If you need a refill on your cardiac medications before your next appointment, please call your pharmacy.

## 2016-10-03 NOTE — Progress Notes (Signed)
SUBJECTIVE: The patient presents for follow-up after being hospitalized for a non-STEMI. Troponins peaked at 18.12. Coronary angiography on 09/18/16 demonstrated severe native three-vessel coronary artery disease with patent SVG to PLA, LIMA to LAD, and total occlusion of the SVG to OM1 with unsuccessful attempt at PCI of the SVG to OM1 with inability to restore flow in spite of extensive balloon angioplasty. LVEDP was elevated and he was treated with IV Lasix. He also developed atrial fibrillation and was started on Eliquis. He was medically managed for coronary artery disease with Plavix, beta blockers, and statin. Echocardiogram demonstrated normal left ventricular systolic function LVEF 60-65%, with normal regional wall motion.  Carotid Dopplers on 09/14/16 showed stable 40-59% bilateral internal carotid artery stenosis. He had a leukocytosis of unclear significance. He has a repeat CBC ordered.  He is gradually regaining his strength. He denies chest pain and shortness of breath. I personally reviewed his blood pressure log which showed normal blood pressures and heart rates. He denies palpitations.  Eliquis is costing him $563 per month.    Review of Systems: As per "subjective", otherwise negative.  Allergies  Allergen Reactions  . Niaspan [Niacin] Hives and Other (See Comments)    NIASPAN ONLY/REACTION: Itching, whelps    Current Outpatient Prescriptions  Medication Sig Dispense Refill  . apixaban (ELIQUIS) 5 MG TABS tablet Take 1 tablet (5 mg total) by mouth 2 (two) times daily. 60 tablet 0  . atorvastatin (LIPITOR) 40 MG tablet Take 1 tablet (40 mg total) by mouth every evening. 90 tablet 3  . chlorthalidone (HYGROTON) 25 MG tablet Take 1 tablet (25 mg total) by mouth daily. 90 tablet 3  . Cinnamon 500 MG TABS Take 500 mg by mouth daily.     . clopidogrel (PLAVIX) 75 MG tablet Take 1 tablet (75 mg total) by mouth daily with breakfast. 30 tablet 0  . cyanocobalamin 500 MCG  tablet Take 500 mcg by mouth daily.    . metoprolol tartrate (LOPRESSOR) 50 MG tablet Take 1 tablet (50 mg total) by mouth 2 (two) times daily. 60 tablet 6  . Multiple Vitamin (MULTIVITAMIN) tablet Take 1 tablet by mouth daily.    . nitroGLYCERIN (NITROSTAT) 0.4 MG SL tablet Place 1 tablet (0.4 mg total) under the tongue every 5 (five) minutes x 3 doses as needed for chest pain. 25 tablet 0   No current facility-administered medications for this visit.     Past Medical History:  Diagnosis Date  . Atrial fibrillation (HCC)    a. post op in 2006  b. recurrence on 08/2016 admission for NSTEMI, place on eliquis  . CAD (coronary artery disease)    a. 2006: s/p CABG at [facility] with LIMA to LAD, SVG to PDA, SVG to OM1, SVG to D1  b. 08/2016: NSTEMI 09/18/16 which showed severe native 3V CAD with patent SVG-->PLA, LIMA-->LAD and total occlusion of SVG--> OM1 with unsuccessful attempt at PCI of SVG--> OM with inability to restore flow.   . Carotid artery disease (HCC)    Doppler, May, 2013, 60-79% bilateral, plan followup 6 months  . CKD (chronic kidney disease)   . Hypertension   . PAC (premature atrial contraction)    PACs... EKG... January, 2011..  . Tremor     Past Surgical History:  Procedure Laterality Date  . CORONARY ARTERY BYPASS GRAFT  2006   [facility]  . CORONARY BALLOON ANGIOPLASTY N/A 09/18/2016   Procedure: Coronary Balloon Angioplasty;  Surgeon: Tonny Bollman, MD;  Location: [facility] INVASIVE CV LAB;  Service: Cardiovascular;  Laterality: N/A;  . LEFT HEART CATH AND CORS/GRAFTS ANGIOGRAPHY N/A 09/18/2016   Procedure: Left Heart Cath and Cors/Grafts Angiography;  Surgeon: Tonny Bollman, MD;  Location: [facility] INVASIVE CV LAB;  Service: Cardiovascular;  Laterality: N/A;    Social History   Social History  . Marital status: Married    Spouse name: N/A  . Number of children: N/A  . Years of education: N/A   Occupational History  . Not on file.   Social History Main Topics  . Smoking  status: Former Smoker    Packs/day: 4.00    Years: 20.00    Types: Cigarettes    Quit date: 02/28/1979  . Smokeless tobacco: Former Neurosurgeon    Types: Snuff, Chew    Quit date: 02/27/1981  . Alcohol use No  . Drug use: No  . Sexual activity: No   Other Topics Concern  . Not on file   Social History Narrative  . No narrative on file     Vitals:   10/03/16 0859  BP: 130/78  Pulse: 68  SpO2: 98%  Weight: 201 lb (91.2 kg)  Height: 5\' 11"  (1.803 m)    Wt Readings from Last 3 Encounters:  10/03/16 201 lb (91.2 kg)  09/20/16 201 lb 9.6 oz (91.4 kg)  08/25/16 211 lb (95.7 kg)     PHYSICAL EXAM General: NAD HEENT: Normal. Neck: No JVD, no thyromegaly. Lungs: Clear to auscultation bilaterally with normal respiratory effort. CV: Nondisplaced PMI.  Regular rate and rhythm, normal S1/S2, no S3/S4, no murmur. No pretibial or periankle edema.  No carotid bruit.   Abdomen: Soft, nontender, no distention.  Neurologic: Alert and oriented.  Psych: Normal affect. Skin: Normal. Musculoskeletal: No gross deformities.    ECG: Most recent ECG reviewed.   Labs: Lab Results  Component Value Date/Time   K 3.7 09/20/2016 09:19 AM   BUN 32 (H) 09/20/2016 09:19 AM   CREATININE 1.68 (H) 09/20/2016 09:19 AM   TSH 0.361 09/20/2016 09:19 AM   HGB 14.3 09/20/2016 09:19 AM     Lipids: Lab Results  Component Value Date/Time   LDLCALC 77 09/18/2016 08:10 AM   CHOL 117 09/18/2016 08:10 AM   TRIG 56 09/18/2016 08:10 AM   HDL 29 (L) 09/18/2016 08:10 AM       ASSESSMENT AND PLAN:  1. CAD/CABG with recent NSTEMI and occlusion of SVG-OM1: Symptomatically stable. Continue Plavix, Lipitor, and metoprolol. I will make a referral to cardiac rehabilitation.  2. Bilateral carotid artery stenosis: Moderate in severity bilaterally (40-59%) in July 2018. Continue Plavix and statin.  3. Essential HTN: Controlled. No changes.  4. Dyslipidemia: Continue Lipitor 40 mg.  5. Atrial fibrillation:  Currently in a regular rhythm. Continue metoprolol and Eliquis. I will provide samples of Eliquis and look into why the cost is so high.  6. Leukocytosis: Unclear significance with no evidence of infection. I will repeat his CBC. Chest x-ray showed a hazy lingular airspace disease which may reflect atelectasis versus pneumonia.   Disposition: Follow up 3 months.  Time spent: 40 minutes, of which greater than 50% was spent reviewing symptoms, relevant blood tests and studies, and discussing management plan with the patient.    Prentice Docker, M.D., F.A.C.C.

## 2016-10-05 ENCOUNTER — Telehealth: Payer: Self-pay | Admitting: *Deleted

## 2016-10-05 NOTE — Telephone Encounter (Signed)
Notes recorded by Lesle Chris, LPN on 02/28/9415 at 3:30 PM EDT Wife Corrie Dandy) notified. Copy to pmd. Follow up scheduled for November. ------  Notes recorded by Laqueta Linden, MD on 10/04/2016 at 9:13 AM EDT WBC count has normalized. Creatinine has improved as well. ------  Notes recorded by Janetta Hora, PA-C on 10/03/2016 at 5:54 PM EDT These came to me for some reason. Thanks!

## 2016-11-01 ENCOUNTER — Telehealth: Payer: Self-pay | Admitting: *Deleted

## 2016-11-01 ENCOUNTER — Other Ambulatory Visit: Payer: Self-pay | Admitting: *Deleted

## 2016-11-01 MED ORDER — APIXABAN 5 MG PO TABS: 5.0000 mg | ORAL_TABLET | Freq: Two times a day (BID) | ORAL | 6 refills | Status: DC

## 2016-11-01 MED ORDER — APIXABAN 5 MG PO TABS: 5.0000 mg | ORAL_TABLET | Freq: Two times a day (BID) | ORAL | 0 refills | Status: DC

## 2016-11-01 NOTE — Telephone Encounter (Signed)
Received fax - Eliquis 5mg  tablet has been approved for tier exception to be covered at a Tier 1 copay.  Authorization good until 02/26/2017.    Patient wife Corrie Dandy) made aware.    Patient will still have to meet their deductible amount ([employer]. $485.00) first, then should start receiving medication at the lesser copay amount.    Doing mail order at that point will also help with the cost.     Printed script given along with more samples as she stated they will get new prescription when they get his next social security check which will be around 11/15/2016.    Wife will call when ready to send rx for 90 day to mail order.

## 2017-01-03 ENCOUNTER — Other Ambulatory Visit: Payer: Self-pay

## 2017-01-03 ENCOUNTER — Ambulatory Visit (INDEPENDENT_AMBULATORY_CARE_PROVIDER_SITE_OTHER): Payer: Medicare Other | Admitting: Cardiovascular Disease

## 2017-01-03 ENCOUNTER — Encounter: Payer: Self-pay | Admitting: Cardiovascular Disease

## 2017-01-03 VITALS — BP 122/60 | HR 56 | Ht 71.0 in | Wt 197.0 lb

## 2017-01-03 DIAGNOSIS — D72829 Elevated white blood cell count, unspecified: Secondary | ICD-10-CM | POA: Diagnosis not present

## 2017-01-03 DIAGNOSIS — I6523 Occlusion and stenosis of bilateral carotid arteries: Secondary | ICD-10-CM | POA: Diagnosis not present

## 2017-01-03 DIAGNOSIS — I10 Essential (primary) hypertension: Secondary | ICD-10-CM | POA: Diagnosis not present

## 2017-01-03 DIAGNOSIS — I209 Angina pectoris, unspecified: Secondary | ICD-10-CM | POA: Diagnosis not present

## 2017-01-03 DIAGNOSIS — I25708 Atherosclerosis of coronary artery bypass graft(s), unspecified, with other forms of angina pectoris: Secondary | ICD-10-CM

## 2017-01-03 DIAGNOSIS — E785 Hyperlipidemia, unspecified: Secondary | ICD-10-CM | POA: Diagnosis not present

## 2017-01-03 DIAGNOSIS — I480 Paroxysmal atrial fibrillation: Secondary | ICD-10-CM

## 2017-01-03 NOTE — Patient Instructions (Signed)

## 2017-01-03 NOTE — Progress Notes (Signed)
SUBJECTIVE: The patient presents for follow-up of coronary artery disease and atrial fibrillation. White blood cell count normalized at 9.1 on 10/03/16.  He is doing well and denies chest pain, palpitations, leg swelling, and shortness of breath.  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  He had his serum urate level checked this morning and these results are currently unavailable.  He takes allopurinol and took 3 days of colchicine.  He denies any bleeding problems with Eliquis or Plavix.  [place] cardiac [place] in [place] and felt this really helped him.   Review of Systems: As per "subjective", otherwise negative.  Allergies  Allergen Reactions  . Niaspan [Niacin] Hives and Other (See Comments)    NIASPAN ONLY/REACTION: Itching, whelps    Current Outpatient Medications  Medication Sig Dispense Refill  . apixaban (ELIQUIS) 5 MG TABS tablet Take 1 tablet (5 mg total) by mouth 2 (two) times daily. 56 tablet 0  . atorvastatin (LIPITOR) 40 MG tablet Take 1 tablet (40 mg total) by mouth every evening. 90 tablet 3  . chlorthalidone (HYGROTON) 25 MG tablet Take 1 tablet (25 mg total) by mouth daily. 90 tablet 3  . Cinnamon 500 MG TABS Take 500 mg by mouth daily.     . clopidogrel (PLAVIX) 75 MG tablet Take 1 tablet (75 mg total) by mouth daily with breakfast. 30 tablet 0  . cyanocobalamin 500 MCG tablet Take 500 mcg by mouth daily.    . metoprolol tartrate (LOPRESSOR) 50 MG tablet Take 1 tablet (50 mg total) by mouth 2 (two) times daily. 60 tablet 6  . Multiple Vitamin (MULTIVITAMIN) tablet Take 1 tablet by mouth daily.    . nitroGLYCERIN (NITROSTAT) 0.4 MG SL tablet Place 1 tablet (0.4 mg total) under the tongue every 5 (five) minutes x 3 doses as needed for chest pain. 25 tablet 0   No current facility-administered medications for this visit.     Past Medical History:  Diagnosis Date  . Atrial fibrillation (HCC)    a. post op in 2006  b. recurrence  on 08/2016 admission for NSTEMI, place on eliquis  . CAD (coronary artery disease)    a. 2006: [facility] with LIMA to LAD, SVG to PDA, SVG to OM1, SVG to D1  b. 08/2016: NSTEMI 09/18/16 which showed severe native 3V CAD with patent SVG-->PLA, LIMA-->LAD and total occlusion of SVG--> OM1 with unsuccessful attempt at PCI of SVG--> OM with inability to restore flow.   . Carotid artery disease (HCC)    Doppler, May, 2013, 60-79% bilateral, plan followup 6 months  . CKD (chronic kidney disease)   . Hypertension   . PAC (premature atrial contraction)    PACs... EKG... January, 2011..  . Tremor     Past Surgical History:  Procedure Laterality Date  . CORONARY ARTERY BYPASS GRAFT  2006   [facility]    Social History   Socioeconomic History  . Marital status: Married    Spouse name: Not on file  . Number of children: Not on file  . Years of education: Not on file  . Highest education level: Not on file  Social Needs  . Financial resource strain: Not on file  . Food insecurity - worry: Not on file  . Food insecurity - inability: Not on file  . Transportation needs - medical: Not on file  . Transportation needs - non-medical: Not on file  Occupational History  . Not on  file  Tobacco Use  . Smoking status: Former Smoker    Packs/day: 4.00    Years: 20.00    Pack years: 80.00    Types: Cigarettes    Last attempt to quit: 02/28/1979    Years since quitting: 37.8  . Smokeless tobacco: Former Neurosurgeon    Types: Snuff, Dorna Bloom    Quit date: 02/27/1981  Substance and Sexual Activity  . Alcohol use: No    Alcohol/week: 0.0 oz  . Drug use: No  . Sexual activity: No  Other Topics Concern  . Not on file  Social History Narrative  . Not on file     Vitals:   01/03/17 0946  BP: 122/60  Pulse: (!) 56  SpO2: 97%  Weight: 197 lb (89.4 kg)  Height: 5\' 11"  (1.803 m)    Wt Readings from Last 3 Encounters:  01/03/17 197 lb (89.4 kg)  10/03/16 201 lb (91.2 kg)  09/20/16 201 lb 9.6 oz (91.4  kg)     PHYSICAL EXAM General: NAD HEENT: Normal. Neck: No JVD, no thyromegaly. Lungs: Clear to auscultation bilaterally with normal respiratory effort. CV: Regular rate and rhythm, normal S1/S2, no S3/S4, no murmur. No pretibial or periankle edema.  No carotid bruit.   Abdomen: Soft, nontender, no distention.  Neurologic: Alert and oriented.  Psych: Normal affect. Skin: Normal. Musculoskeletal: No gross deformities.    ECG: Most recent ECG reviewed.   Labs: Lab Results  Component Value Date/Time   K 5.3 10/03/2016 11:07 AM   BUN 32 (H) 10/03/2016 11:07 AM   CREATININE 1.38 (H) 10/03/2016 11:07 AM   TSH 0.361 09/20/2016 09:19 AM   HGB 13.6 10/03/2016 11:07 AM     Lipids: Lab Results  Component Value Date/Time   LDLCALC 77 09/18/2016 08:10 AM   CHOL 117 09/18/2016 08:10 AM   TRIG 56 09/18/2016 08:10 AM   HDL 29 (L) 09/18/2016 08:10 AM       ASSESSMENT AND PLAN:  1. CAD/CABG with recent NSTEMI and occlusion of SVG-OM1: Symptomatically stable. Continue Plavix, Lipitor, and metoprolol.  2. Bilateral carotid artery stenosis: Moderate in severity bilaterally (40-59%) in July 2018.  I will repeat next year. Continue Plavix and statin.  3. Essential HTN: Controlled. No changes.  4.Dyslipidemia: Continue Lipitor 40 mg.  5. Paroxysmal atrial fibrillation: Currently in a regular rhythm. Continue metoprolol and Eliquis. No changes to therapy.  6. Leukocytosis: White blood cell count normalized at 9.1 on 10/03/16.     Disposition: Follow up 6 months   Prentice Docker, M.D., F.A.C.C.

## 2017-02-14 ENCOUNTER — Other Ambulatory Visit: Payer: Self-pay | Admitting: *Deleted

## 2017-02-14 MED ORDER — METOPROLOL TARTRATE 50 MG PO TABS: 50.0000 mg | ORAL_TABLET | Freq: Two times a day (BID) | ORAL | 1 refills | Status: DC

## 2017-02-14 MED ORDER — CLOPIDOGREL BISULFATE 75 MG PO TABS: 75.0000 mg | ORAL_TABLET | Freq: Every day | ORAL | 1 refills | Status: DC

## 2017-03-15 ENCOUNTER — Other Ambulatory Visit: Payer: Self-pay | Admitting: *Deleted

## 2017-03-15 MED ORDER — APIXABAN 5 MG PO TABS: 5.0000 mg | ORAL_TABLET | Freq: Two times a day (BID) | ORAL | 3 refills | Status: DC

## 2017-03-22 ENCOUNTER — Telehealth: Payer: Self-pay | Admitting: Cardiovascular Disease

## 2017-03-22 NOTE — Telephone Encounter (Signed)
LMTCB

## 2017-03-22 NOTE — Telephone Encounter (Signed)
I suspect all the NOAC's would be just as expensive. Can try seeing what Xarelto would cost him. Otherwise, warfarin would be the only affordable alternative.

## 2017-03-22 NOTE — Telephone Encounter (Signed)
Patient walk in  Stated can not afford Eliquis.   Wanted to know what options they have and if he needs to stay on this brand what can they do to help with cost

## 2017-03-22 NOTE — Telephone Encounter (Signed)
Wife returned call stating patient could not afford the deductible for $485 for eliquis. Even if patient qualified for assistance they would not be able to wait that long for medication. Patient also does not want to switch to warfarin. Patient questioning is there anything else he can take?

## 2017-03-23 NOTE — Telephone Encounter (Signed)
Patients wife will come by office to pick up assistance forms.

## 2017-05-31 ENCOUNTER — Other Ambulatory Visit: Payer: Self-pay | Admitting: Cardiovascular Disease

## 2017-07-02 ENCOUNTER — Other Ambulatory Visit: Payer: Self-pay | Admitting: Cardiovascular Disease

## 2017-07-09 ENCOUNTER — Encounter: Payer: Self-pay | Admitting: Cardiovascular Disease

## 2017-07-09 ENCOUNTER — Ambulatory Visit (INDEPENDENT_AMBULATORY_CARE_PROVIDER_SITE_OTHER): Payer: Medicare Other | Admitting: Cardiovascular Disease

## 2017-07-09 VITALS — BP 122/68 | HR 72 | Ht 71.0 in | Wt 211.0 lb

## 2017-07-09 DIAGNOSIS — I25708 Atherosclerosis of coronary artery bypass graft(s), unspecified, with other forms of angina pectoris: Secondary | ICD-10-CM | POA: Diagnosis not present

## 2017-07-09 DIAGNOSIS — I252 Old myocardial infarction: Secondary | ICD-10-CM | POA: Diagnosis not present

## 2017-07-09 DIAGNOSIS — H9319 Tinnitus, unspecified ear: Secondary | ICD-10-CM

## 2017-07-09 DIAGNOSIS — I480 Paroxysmal atrial fibrillation: Secondary | ICD-10-CM

## 2017-07-09 DIAGNOSIS — I10 Essential (primary) hypertension: Secondary | ICD-10-CM | POA: Diagnosis not present

## 2017-07-09 DIAGNOSIS — E785 Hyperlipidemia, unspecified: Secondary | ICD-10-CM | POA: Diagnosis not present

## 2017-07-09 DIAGNOSIS — I6523 Occlusion and stenosis of bilateral carotid arteries: Secondary | ICD-10-CM | POA: Diagnosis not present

## 2017-07-09 MED ORDER — CHLORTHALIDONE 25 MG PO TABS: 25.0000 mg | ORAL_TABLET | Freq: Every day | ORAL | 3 refills | Status: DC

## 2017-07-09 MED ORDER — APIXABAN 5 MG PO TABS: 5.0000 mg | ORAL_TABLET | Freq: Two times a day (BID) | ORAL | 3 refills | Status: DC

## 2017-07-09 MED ORDER — NITROGLYCERIN 0.4 MG SL SUBL: 0.4000 mg | SUBLINGUAL_TABLET | SUBLINGUAL | 0 refills | Status: DC | PRN

## 2017-07-09 MED ORDER — APIXABAN 5 MG PO TABS: 5.0000 mg | ORAL_TABLET | Freq: Two times a day (BID) | ORAL | 6 refills | Status: DC

## 2017-07-09 MED ORDER — ATORVASTATIN CALCIUM 40 MG PO TABS: 40.0000 mg | ORAL_TABLET | Freq: Every evening | ORAL | 3 refills | Status: DC

## 2017-07-09 MED ORDER — METOPROLOL TARTRATE 50 MG PO TABS: 50.0000 mg | ORAL_TABLET | Freq: Two times a day (BID) | ORAL | 3 refills | Status: DC

## 2017-07-09 MED ORDER — CLOPIDOGREL BISULFATE 75 MG PO TABS: 75.0000 mg | ORAL_TABLET | Freq: Every day | ORAL | 3 refills | Status: DC

## 2017-07-09 NOTE — Addendum Note (Signed)
Addended by: Lesle Chris on: 07/09/2017 04:58 PM   Modules accepted: Orders

## 2017-07-09 NOTE — Patient Instructions (Addendum)
Medication Instructions:  Continue all current medications.  Labwork: none  Testing/Procedures:  Your physician has requested that you have a carotid duplex. This test is an ultrasound of the carotid arteries in your neck. It looks at blood flow through these arteries that supply the brain with blood. Allow one hour for this exam. There are no restrictions or special instructions.  Office will contact with results via phone or letter.    Follow-Up: Your physician wants you to follow up in: 6 months.  You will receive a reminder letter in the mail one-two months in advance.  If you don't receive a letter, please call our office to schedule the follow up appointment   Any Other Special Instructions Will Be Listed Below (If Applicable). You have been referred to:  ENT - Dr. Christain Sacramento Sidney Ace   If you need a refill on your cardiac medications before your next appointment, please call your pharmacy.

## 2017-07-09 NOTE — Progress Notes (Signed)
SUBJECTIVE: The patient presents for follow-up of coronary artery disease and atrial fibrillation. He was hospitalized for a non-STEMI in July 2018. Coronary angiography on 09/18/16 demonstrated severe native three-vessel coronary artery disease with patent SVG to PLA, LIMA to LAD, and total occlusion of the SVG to OM1 with unsuccessful attempt at PCI of the SVG to OM1 with inability to restore flow in spite of extensive balloon angioplasty.   Echocardiogram demonstrated normal left ventricular systolic function LVEF 60-65%, with normal regional wall motion.  Carotid Dopplers on 09/14/16 showed stable 40-59% bilateral internal carotid artery stenosis.  He denies chest pain.  He has not had to use nitroglycerin since his last visit.  He denies palpitations, syncope, and leg swelling.  He has chronic exertional dyspnea since non-STEMI in July 2018 which is stable.  He denies orthopnea and paroxysmal nocturnal dyspnea.  He also complains of bilateral ringing in his ears.  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  Review of Systems: As per "subjective", otherwise negative.  Allergies  Allergen Reactions  . Niaspan [Niacin] Hives and Other (See Comments)    NIASPAN ONLY/REACTION: Itching, whelps    Current Outpatient Medications  Medication Sig Dispense Refill  . apixaban (ELIQUIS) 5 MG TABS tablet Take 1 tablet (5 mg total) by mouth 2 (two) times daily. 180 tablet 3  . atorvastatin (LIPITOR) 40 MG tablet TAKE 1 TABLET EVERY EVENING 90 tablet 0  . chlorthalidone (HYGROTON) 25 MG tablet TAKE 1 TABLET EVERY DAY 90 tablet 0  . Cinnamon 500 MG TABS Take 500 mg by mouth daily.     . clopidogrel (PLAVIX) 75 MG tablet TAKE 1 TABLET (75 MG TOTAL) BY MOUTH DAILY WITH BREAKFAST. 90 tablet 0  . cyanocobalamin 500 MCG tablet Take 500 mcg by mouth daily.    . metoprolol tartrate (LOPRESSOR) 50 MG tablet Take 1 tablet (50  mg total) by mouth 2 (two) times daily. 180 tablet 1  . Multiple Vitamin (MULTIVITAMIN) tablet Take 1 tablet by mouth daily.    . nitroGLYCERIN (NITROSTAT) 0.4 MG SL tablet Place 1 tablet (0.4 mg total) under the tongue every 5 (five) minutes x 3 doses as needed for chest pain. 25 tablet 0   No current facility-administered medications for this visit.     Past Medical History:  Diagnosis Date  . Atrial fibrillation (HCC)    a. post op in 2006  b. recurrence on 08/2016 admission for NSTEMI, place on eliquis  . CAD (coronary artery disease)    a. 2006: s/p CABG at [facility] with LIMA to LAD, SVG to PDA, SVG to OM1, SVG to D1  b. 08/2016: NSTEMI 09/18/16 which showed severe native 3V CAD with patent SVG-->PLA, LIMA-->LAD and total occlusion of SVG--> OM1 with unsuccessful attempt at PCI of SVG--> OM with inability to restore flow.   . Carotid artery disease (HCC)    Doppler, May, 2013, 60-79% bilateral, plan followup 6 months  . CKD (chronic kidney disease)   . Hypertension   . PAC (premature atrial contraction)    PACs... EKG... January, 2011..  . Tremor     Past Surgical History:  Procedure Laterality Date  . CORONARY ARTERY BYPASS GRAFT  2006   [facility]  . CORONARY BALLOON ANGIOPLASTY N/A 09/18/2016   Procedure: Coronary Balloon Angioplasty;  Surgeon: Tonny Bollman, MD;  Location: [facility] INVASIVE CV LAB;  Service: Cardiovascular;  Laterality: N/A;  . LEFT HEART CATH  AND CORS/GRAFTS ANGIOGRAPHY N/A 09/18/2016   Procedure: Left Heart Cath and Cors/Grafts Angiography;  Surgeon: Tonny Bollman, MD;  Location: [facility] INVASIVE CV LAB;  Service: Cardiovascular;  Laterality: N/A;    Social History   Socioeconomic History  . Marital status: Married    Spouse name: Not on file  . Number of children: Not on file  . Years of education: Not on file  . Highest education level: Not on file  Occupational History  . Not on file  Social Needs  . Financial resource strain: Not on file  . Food insecurity:     Worry: Not on file    Inability: Not on file  . Transportation needs:    Medical: Not on file    Non-medical: Not on file  Tobacco Use  . Smoking status: Former Smoker    Packs/day: 4.00    Years: 20.00    Pack years: 80.00    Types: Cigarettes    Last attempt to quit: 02/28/1979    Years since quitting: 38.3  . Smokeless tobacco: Former Neurosurgeon    Types: Snuff, Dorna Bloom    Quit date: 02/27/1981  Substance and Sexual Activity  . Alcohol use: No    Alcohol/week: 0.0 oz  . Drug use: No  . Sexual activity: Never  Lifestyle  . Physical activity:    Days per week: Not on file    Minutes per session: Not on file  . Stress: Not on file  Relationships  . Social connections:    Talks on phone: Not on file    Gets together: Not on file    Attends religious service: Not on file    Active member of club or organization: Not on file    Attends meetings of clubs or organizations: Not on file    Relationship status: Not on file  . Intimate partner violence:    Fear of current or ex partner: Not on file    Emotionally abused: Not on file    Physically abused: Not on file    Forced sexual activity: Not on file  Other Topics Concern  . Not on file  Social History Narrative  . Not on file     Vitals:   07/09/17 1521  BP: 122/68  Pulse: 72  SpO2: 98%  Weight: 211 lb (95.7 kg)  Height: 5\' 11"  (1.803 m)    Wt Readings from Last 3 Encounters:  07/09/17 211 lb (95.7 kg)  01/03/17 197 lb (89.4 kg)  10/03/16 201 lb (91.2 kg)     PHYSICAL EXAM General: NAD HEENT: Normal. Neck: No JVD, no thyromegaly. Lungs: Clear to auscultation bilaterally with normal respiratory effort. CV: Regular rate and rhythm, normal S1/S2, no S3/S4, no murmur. No pretibial or periankle edema.  No carotid bruit.   Abdomen: Soft, nontender, no distention.  Neurologic: Alert and oriented.  Psych: Normal affect. Skin: Normal. Musculoskeletal: No gross deformities.    ECG: Most recent ECG  reviewed.   Labs: Lab Results  Component Value Date/Time   K 5.3 10/03/2016 11:07 AM   BUN 32 (H) 10/03/2016 11:07 AM   CREATININE 1.38 (H) 10/03/2016 11:07 AM   TSH 0.361 09/20/2016 09:19 AM   HGB 13.6 10/03/2016 11:07 AM     Lipids: Lab Results  Component Value Date/Time   LDLCALC 77 09/18/2016 08:10 AM   CHOL 117 09/18/2016 08:10 AM   TRIG 56 09/18/2016 08:10 AM   HDL 29 (L) 09/18/2016 08:10 AM  ASSESSMENT AND PLAN:  1. CAD/CABGwith NSTEMI (July 2018) and occlusion of SVG-OM1:Symptomatically stable.ContinuePlavix, Lipitor, and metoprolol.  2. Bilateral carotid artery stenosis: Moderate in severity bilaterally (40-59%) in July 2018.  I will repeat Dopplers. ContinuePlavix andstatin.  3. Essential HTN: Controlled. No changes.  4.Dyslipidemia: Continue Lipitor 40 mg.  5.Paroxysmal atrial fibrillation: Currently in sinus rhythm. Continue metoprolol and Eliquis. No changes to therapy.  6.  Bilateral tinnitus: I will make an ENT referral as per their request.   Disposition: Follow up 6 months   Prentice Docker, M.D., F.A.C.C.

## 2017-08-08 ENCOUNTER — Ambulatory Visit (INDEPENDENT_AMBULATORY_CARE_PROVIDER_SITE_OTHER): Payer: Medicare Other

## 2017-08-08 DIAGNOSIS — I6523 Occlusion and stenosis of bilateral carotid arteries: Secondary | ICD-10-CM | POA: Diagnosis not present

## 2017-08-13 ENCOUNTER — Ambulatory Visit (INDEPENDENT_AMBULATORY_CARE_PROVIDER_SITE_OTHER): Payer: Medicare Other | Admitting: Otolaryngology

## 2017-08-13 DIAGNOSIS — H903 Sensorineural hearing loss, bilateral: Secondary | ICD-10-CM | POA: Diagnosis not present

## 2017-08-13 DIAGNOSIS — H9313 Tinnitus, bilateral: Secondary | ICD-10-CM

## 2017-08-15 ENCOUNTER — Telehealth: Payer: Self-pay | Admitting: *Deleted

## 2017-08-15 NOTE — Telephone Encounter (Signed)
Notes recorded by Lesle Chris, LPN on 3/90/3009 at 8:48 AM EDT Wife Corrie Dandy) notified. Copy to pmd. ------  Notes recorded by Laqueta Linden, MD on 08/08/2017 at 8:57 PM EDT Moderate bilateral blockages which are stable since prior study. Repeat in 1 year.

## 2017-09-03 ENCOUNTER — Other Ambulatory Visit: Payer: Self-pay | Admitting: Cardiovascular Disease

## 2018-01-15 ENCOUNTER — Ambulatory Visit: Payer: Medicare Other | Admitting: Cardiovascular Disease

## 2018-02-27 DIAGNOSIS — J189 Pneumonia, unspecified organism: Secondary | ICD-10-CM

## 2018-02-27 HISTORY — DX: Pneumonia, unspecified organism: J18.9

## 2018-04-09 ENCOUNTER — Encounter: Payer: Self-pay | Admitting: Cardiovascular Disease

## 2018-04-09 ENCOUNTER — Ambulatory Visit (INDEPENDENT_AMBULATORY_CARE_PROVIDER_SITE_OTHER): Payer: Medicare Other | Admitting: Cardiovascular Disease

## 2018-04-09 ENCOUNTER — Other Ambulatory Visit: Payer: Self-pay | Admitting: *Deleted

## 2018-04-09 VITALS — BP 140/68 | HR 83 | Ht 71.0 in | Wt 217.0 lb

## 2018-04-09 DIAGNOSIS — I25708 Atherosclerosis of coronary artery bypass graft(s), unspecified, with other forms of angina pectoris: Secondary | ICD-10-CM

## 2018-04-09 DIAGNOSIS — R0609 Other forms of dyspnea: Secondary | ICD-10-CM | POA: Diagnosis not present

## 2018-04-09 DIAGNOSIS — I10 Essential (primary) hypertension: Secondary | ICD-10-CM

## 2018-04-09 DIAGNOSIS — I6523 Occlusion and stenosis of bilateral carotid arteries: Secondary | ICD-10-CM | POA: Diagnosis not present

## 2018-04-09 DIAGNOSIS — I480 Paroxysmal atrial fibrillation: Secondary | ICD-10-CM

## 2018-04-09 DIAGNOSIS — E785 Hyperlipidemia, unspecified: Secondary | ICD-10-CM

## 2018-04-09 MED ORDER — ISOSORBIDE MONONITRATE ER 30 MG PO TB24: 30.0000 mg | ORAL_TABLET | Freq: Every day | ORAL | 0 refills | Status: DC

## 2018-04-09 MED ORDER — ISOSORBIDE MONONITRATE ER 30 MG PO TB24: 30.0000 mg | ORAL_TABLET | Freq: Every day | ORAL | 3 refills | Status: DC

## 2018-04-09 MED ORDER — ASPIRIN EC 81 MG PO TBEC: 81.0000 mg | DELAYED_RELEASE_TABLET | Freq: Every day | ORAL | 3 refills | Status: DC

## 2018-04-09 NOTE — Patient Instructions (Addendum)
Medication Instructions:   Your physician has recommended you make the following change in your medication:   Stop plavix  Start aspirin 81 mg by mouth daily  Start isosorbide mononitrate 30 mg by mouth daily  Continue all other medications the same  Labwork:  Your physician recommends that you return for a FASTING lipid profile: as soon as possible.  Testing/Procedures: Your physician has requested that you have a carotid duplex in June 2020. This test is an ultrasound of the carotid arteries in your neck. It looks at blood flow through these arteries that supply the brain with blood. Allow one hour for this exam. There are no restrictions or special instructions.  Follow-Up:  Your physician recommends that you schedule a follow-up appointment in: 6 months. You will receive a reminder letter in the mail in about 4 months reminding you to call and schedule your appointment. If you don't receive this letter, please contact our office.  Any Other Special Instructions Will Be Listed Below (If Applicable).  If you need a refill on your cardiac medications before your next appointment, please call your pharmacy.

## 2018-04-09 NOTE — Progress Notes (Signed)
SUBJECTIVE: The patient presents for follow-up of coronary artery disease and atrial fibrillation. He was hospitalized for a non-STEMI in July 2018. Coronary angiography on 09/18/16 demonstrated severe native three-vessel coronary artery disease with patent SVG to PLA, LIMA to LAD, and total occlusion of the SVG to OM1 with unsuccessful attempt at PCI of the SVG to OM1 with inability to restore flow in spite of extensive balloon angioplasty.   Echocardiogram demonstrated normal left ventricular systolic function LVEF 60-65%, with normal regional wall motion.   He denies chest pain.  He has had to use nitroglycerin once since his last visit with me.  He is more short of breath when walking up hills than he was at his last visit.  He is okay on level ground.  He denies leg swelling and palpitations.    Review of Systems: As per "subjective", otherwise negative.  Allergies  Allergen Reactions  . Niaspan [Niacin] Hives and Other (See Comments)    NIASPAN ONLY/REACTION: Itching, whelps    Current Outpatient Medications  Medication Sig Dispense Refill  . atorvastatin (LIPITOR) 40 MG tablet Take 1 tablet (40 mg total) by mouth every evening. 90 tablet 3  . chlorthalidone (HYGROTON) 25 MG tablet Take 1 tablet (25 mg total) by mouth daily. 90 tablet 3  . Cinnamon 500 MG TABS Take 500 mg by mouth daily.     . clopidogrel (PLAVIX) 75 MG tablet Take 1 tablet (75 mg total) by mouth daily with breakfast. 90 tablet 3  . cyanocobalamin 500 MCG tablet Take 500 mcg by mouth daily.    Marland Kitchen. ELIQUIS 5 MG TABS tablet TAKE 1 TABLET BY MOUTH TWICE DAILY 180 tablet 1  . metoprolol tartrate (LOPRESSOR) 50 MG tablet Take 1 tablet (50 mg total) by mouth 2 (two) times daily. 180 tablet 3  . Multiple Vitamin (MULTIVITAMIN) tablet Take 1 tablet by mouth daily.    . nitroGLYCERIN (NITROSTAT) 0.4 MG SL tablet Place 1 tablet (0.4 mg total) under the tongue every 5 (five) minutes x 3 doses as needed for chest pain.  25 tablet 0   No current facility-administered medications for this visit.     Past Medical History:  Diagnosis Date  . Atrial fibrillation (HCC)    a. post op in 2006  b. recurrence on 08/2016 admission for NSTEMI, place on eliquis  . CAD (coronary artery disease)    a. [facility] to LAD, SVG to PDA, SVG to OM1, SVG to D1  b. 08/2016: NSTEMI 09/18/16 which showed severe native 3V CAD with patent SVG-->PLA, LIMA-->LAD and total occlusion of SVG--> OM1 with unsuccessful attempt at PCI of SVG--> OM with inability to restore flow.   . Carotid artery disease (HCC)    Doppler, May, 2013, 60-79% bilateral, plan followup 6 months  . CKD (chronic kidney disease)   . Hypertension   . PAC (premature atrial contraction)    PACs... EKG... January, 2011..  . Tremor     Past Surgical History:  Procedure Laterality Date  . CORONARY ARTERY BYPASS GRAFT  2006   [facility]  . CORONARY BALLOON ANGIOPLASTY N/A 09/18/2016   Procedure: Coronary Balloon Angioplasty;  Surgeon: Tonny Bollmanooper, Michael, MD;  [facility];  Service: Cardiovascular;  Laterality: N/A;  . LEFT HEART CATH AND CORS/GRAFTS ANGIOGRAPHY N/A 09/18/2016   Procedure: Left Heart Cath and Cors/Grafts Angiography;  Surgeon: Tonny Bollmanooper, Michael, MD;  [facility];  Service: Cardiovascular;  Laterality: N/A;  Social History   Socioeconomic History  . Marital status: Married    Spouse name: Not on file  . Number of children: Not on file  . Years of education: Not on file  . Highest education level: Not on file  Occupational History  . Not on file  Social Needs  . Financial resource strain: Not on file  . Food insecurity:    Worry: Not on file    Inability: Not on file  . Transportation needs:    Medical: Not on file    Non-medical: Not on file  Tobacco Use  . Smoking status: Former Smoker    Packs/day: 4.00    Years: 20.00    Pack years: 80.00    Types: Cigarettes    Last attempt to quit:  02/28/1979    Years since quitting: 39.1  . Smokeless tobacco: Former Neurosurgeon    Types: Snuff, Dorna Bloom    Quit date: 02/27/1981  Substance and Sexual Activity  . Alcohol use: No    Alcohol/week: 0.0 standard drinks  . Drug use: No  . Sexual activity: Never  Lifestyle  . Physical activity:    Days per week: Not on file    Minutes per session: Not on file  . Stress: Not on file  Relationships  . Social connections:    Talks on phone: Not on file    Gets together: Not on file    Attends religious service: Not on file    Active member of club or organization: Not on file    Attends meetings of clubs or organizations: Not on file    Relationship status: Not on file  . Intimate partner violence:    Fear of current or ex partner: Not on file    Emotionally abused: Not on file    Physically abused: Not on file    Forced sexual activity: Not on file  Other Topics Concern  . Not on file  Social History Narrative  . Not on file     Vitals:   04/09/18 1311  BP: 140/68  Pulse: 83  SpO2: 98%  Weight: 217 lb (98.4 kg)  Height: 5\' 11"  (1.803 m)    Wt Readings from Last 3 Encounters:  04/09/18 217 lb (98.4 kg)  07/09/17 211 lb (95.7 kg)  01/03/17 197 lb (89.4 kg)     PHYSICAL EXAM General: NAD HEENT: Normal. Neck: No JVD, no thyromegaly. Lungs: Clear to auscultation bilaterally with normal respiratory effort. CV: Regular rate and rhythm, normal S1/S2, no S3/S4, no murmur. No pretibial or periankle edema.  No carotid bruit.   Abdomen: Soft, nontender, no distention.  Neurologic: Alert and oriented.  Psych: Normal affect. Skin: Normal. Musculoskeletal: No gross deformities.    ECG: Reviewed above under Subjective   Labs: Lab Results  Component Value Date/Time   K 5.3 10/03/2016 11:07 AM   BUN 32 (H) 10/03/2016 11:07 AM   CREATININE 1.38 (H) 10/03/2016 11:07 AM   TSH 0.361 09/20/2016 09:19 AM   HGB 13.6 10/03/2016 11:07 AM     Lipids: Lab Results  Component Value  Date/Time   LDLCALC 77 09/18/2016 08:10 AM   CHOL 117 09/18/2016 08:10 AM   TRIG 56 09/18/2016 08:10 AM   HDL 29 (L) 09/18/2016 08:10 AM       ASSESSMENT AND PLAN: 1.  Coronary artery disease: He has a history of CABGwith NSTEMI (July 2018) and occlusion of SVG-OM1. He has had more exertional dyspnea when walking uphill.ContinueLipitor and metoprolol.  I will discontinue Plavix and start aspirin 81 mg daily.  I will start Imdur 30 mg daily.  2. Bilateral carotid artery stenosis: Moderate in severity bilaterally (40-59%) in June 2019.I will repeat Dopplers in June 2020. I will discontinue Plavix.  I will start aspirin 81 mg.  Continue atorvastatin.  3. Essential HTN: BP is mildly elevated.  I will monitor given addition of Imdur.  4.Dyslipidemia: Continue Lipitor 40 mg.  I will obtain a copy of lipids from PCP.  If no lipids have been checked recently, I will order.  5.Paroxysmal atrial fibrillation: Currently in sinus rhythm. Continue metoprolol and Eliquis.No changes to therapy.    Disposition: Follow up 6 months   Prentice Docker, M.D., F.A.C.C.

## 2018-04-11 ENCOUNTER — Telehealth: Payer: Self-pay | Admitting: *Deleted

## 2018-04-11 NOTE — Telephone Encounter (Signed)
Notes recorded by Lesle Chris, LPN on 03/17/4172 at 10:33 AM EST Wife Corrie Dandy) notified. Copy to pmd. ------  Notes recorded by Laqueta Linden, MD on 04/10/2018 at 4:11 PM EST Normal.

## 2018-05-03 ENCOUNTER — Other Ambulatory Visit: Payer: Self-pay | Admitting: Cardiovascular Disease

## 2018-05-03 ENCOUNTER — Telehealth: Payer: Self-pay | Admitting: Cardiovascular Disease

## 2018-05-03 MED ORDER — APIXABAN 5 MG PO TABS: 5.0000 mg | ORAL_TABLET | Freq: Two times a day (BID) | ORAL | 6 refills | Status: DC

## 2018-05-03 NOTE — Telephone Encounter (Signed)
Medication sent to pharmacy  

## 2018-05-03 NOTE — Telephone Encounter (Signed)
°*  STAT* If patient is at the pharmacy, call can be transferred to refill team.   1. Which medications need to be refilled? ELIQUIS 5 MG TABS tablet    2. Which pharmacy/location (including street and city if local pharmacy) is medication to be sent to? [facility] in [place]  3. Do they need a 30 day or 90 day supply?

## 2018-06-28 ENCOUNTER — Other Ambulatory Visit: Payer: Self-pay | Admitting: Cardiovascular Disease

## 2018-08-21 ENCOUNTER — Other Ambulatory Visit: Payer: Self-pay | Admitting: Cardiovascular Disease

## 2018-08-21 ENCOUNTER — Other Ambulatory Visit: Payer: Self-pay | Admitting: *Deleted

## 2018-08-21 ENCOUNTER — Telehealth: Payer: Self-pay | Admitting: *Deleted

## 2018-08-21 ENCOUNTER — Ambulatory Visit (INDEPENDENT_AMBULATORY_CARE_PROVIDER_SITE_OTHER): Payer: Medicare Other

## 2018-08-21 ENCOUNTER — Other Ambulatory Visit: Payer: Self-pay

## 2018-08-21 DIAGNOSIS — I6523 Occlusion and stenosis of bilateral carotid arteries: Secondary | ICD-10-CM

## 2018-08-21 NOTE — Telephone Encounter (Signed)
Notes recorded by Laurine Blazer, LPN on 2/70/6237 at 6:28 PM EDT  Patient notified. Copy to pmd. Follow up scheduled for August w/ Dr. Bronson Ing.    ------   Notes recorded by Herminio Commons, MD on 08/21/2018 at 2:26 PM EDT  Moderate blockage in both arteries. Repeat study in 1 year.

## 2018-10-18 ENCOUNTER — Other Ambulatory Visit: Payer: Self-pay

## 2018-10-18 ENCOUNTER — Ambulatory Visit (INDEPENDENT_AMBULATORY_CARE_PROVIDER_SITE_OTHER): Payer: Medicare Other | Admitting: Cardiovascular Disease

## 2018-10-18 ENCOUNTER — Encounter: Payer: Self-pay | Admitting: Cardiovascular Disease

## 2018-10-18 VITALS — BP 128/80 | HR 63 | Ht 71.0 in | Wt 204.2 lb

## 2018-10-18 DIAGNOSIS — I10 Essential (primary) hypertension: Secondary | ICD-10-CM

## 2018-10-18 DIAGNOSIS — R0609 Other forms of dyspnea: Secondary | ICD-10-CM

## 2018-10-18 DIAGNOSIS — I6523 Occlusion and stenosis of bilateral carotid arteries: Secondary | ICD-10-CM | POA: Diagnosis not present

## 2018-10-18 DIAGNOSIS — I25708 Atherosclerosis of coronary artery bypass graft(s), unspecified, with other forms of angina pectoris: Secondary | ICD-10-CM

## 2018-10-18 DIAGNOSIS — E785 Hyperlipidemia, unspecified: Secondary | ICD-10-CM

## 2018-10-18 DIAGNOSIS — I480 Paroxysmal atrial fibrillation: Secondary | ICD-10-CM | POA: Diagnosis not present

## 2018-10-18 NOTE — Progress Notes (Signed)
SUBJECTIVE: The patient presents for follow-up of coronary artery disease and atrial fibrillation. He was hospitalized for a non-STEMI in July 2018. Coronary angiography on 09/18/16 demonstrated severe native three-vessel coronary artery disease with patent SVG to PLA, LIMA to LAD, and total occlusion of the SVG to OM1 with unsuccessful attempt at PCI of the SVG to OM1 with inability to restore flow in spite of extensive balloon angioplasty.   Echocardiogram demonstrated normal left ventricular systolic function LVEF 66-06%, with normal regional wall motion.   He is doing well and denies chest pain, palpitations, orthopnea, and leg swelling.  His primary problems are orthopedic in nature.  He needs bilateral knee replacement and right elbow replacement.  He has chronic exertional dyspnea which is stable.  He does fine on level ground but has some shortness of breath when walking uphill.    Review of Systems: As per "subjective", otherwise negative.  Allergies  Allergen Reactions  . Niaspan [Niacin] Hives and Other (See Comments)    NIASPAN ONLY/REACTION: Itching, whelps    Current Outpatient Medications  Medication Sig Dispense Refill  . apixaban (ELIQUIS) 5 MG TABS tablet Take 1 tablet (5 mg total) by mouth 2 (two) times daily. 60 tablet 6  . aspirin EC 81 MG tablet Take 1 tablet (81 mg total) by mouth daily. 90 tablet 3  . atorvastatin (LIPITOR) 40 MG tablet TAKE 1 TABLET EVERY EVENING 90 tablet 1  . chlorthalidone (HYGROTON) 25 MG tablet TAKE 1 TABLET EVERY DAY 90 tablet 1  . cyanocobalamin 500 MCG tablet Take 500 mcg by mouth daily.    . metoprolol tartrate (LOPRESSOR) 50 MG tablet TAKE 1 TABLET TWICE DAILY 180 tablet 1  . Multiple Vitamin (MULTIVITAMIN) tablet Take 1 tablet by mouth daily.    . nitroGLYCERIN (NITROSTAT) 0.4 MG SL tablet Place 1 tablet (0.4 mg total) under the tongue every 5 (five) minutes x 3 doses as needed for chest pain. 25 tablet 0  . isosorbide  mononitrate (IMDUR) 30 MG 24 hr tablet Take 1 tablet (30 mg total) by mouth daily. 30 tablet 0   No current facility-administered medications for this visit.     Past Medical History:  Diagnosis Date  . Atrial fibrillation ([place])    a. post op in 2006  b. recurrence on 08/2016 admission for NSTEMI, place on eliquis  . CAD (coronary artery disease)    a. 2006: s/p CABG at [facility] with LIMA to LAD, SVG to PDA, SVG to OM1, SVG to D1  b. 08/2016: NSTEMI 09/18/16 which showed severe native 3V CAD with patent SVG-->PLA, LIMA-->LAD and total occlusion of SVG--> OM1 with unsuccessful attempt at PCI of SVG--> OM with inability to restore flow.   . Carotid artery disease ([place])    Doppler, May, 2013, 60-79% bilateral, plan followup 6 months  . CKD (chronic kidney disease)   . Hypertension   . PAC (premature atrial contraction)    PACs... EKG... January, 2011..  . Tremor     Past Surgical History:  Procedure Laterality Date  . CORONARY ARTERY BYPASS GRAFT  2006   [facility]  . CORONARY BALLOON ANGIOPLASTY N/A 09/18/2016   Procedure: Coronary Balloon Angioplasty;  Surgeon: Sherren Mocha, MD;  Location: [facility] [place] [facility];  Service: Cardiovascular;  Laterality: N/A;  . LEFT HEART CATH AND CORS/GRAFTS ANGIOGRAPHY N/A 09/18/2016   Procedure: Left Heart Cath and Cors/Grafts Angiography;  Surgeon: Sherren Mocha, MD;  Location: [facility] CV [facility];  Service: Cardiovascular;  Laterality:  N/A;    Social History   Socioeconomic History  . Marital status: Married    Spouse name: Not on file  . Number of children: Not on file  . Years of education: Not on file  . Highest education level: Not on file  Occupational History  . Not on file  Social Needs  . Financial resource strain: Not on file  . Food insecurity    Worry: Not on file    Inability: Not on file  . Transportation needs    Medical: Not on file    Non-medical: Not on file  Tobacco Use  . Smoking status: Former Smoker    Packs/day: 4.00     Years: 20.00    Pack years: 80.00    Types: Cigarettes    Quit date: 02/28/1979    Years since quitting: 39.6  . Smokeless tobacco: Former NeurosurgeonUser    Types: Snuff, Dorna BloomChew    Quit date: 02/27/1981  Substance and Sexual Activity  . Alcohol use: No    Alcohol/week: 0.0 standard drinks  . Drug use: No  . Sexual activity: Never  Lifestyle  . Physical activity    Days per week: Not on file    Minutes per session: Not on file  . Stress: Not on file  Relationships  . Social Musicianconnections    Talks on phone: Not on file    Gets together: Not on file    Attends religious service: Not on file    Active member of club or organization: Not on file    Attends meetings of clubs or organizations: Not on file    Relationship status: Not on file  . Intimate partner violence    Fear of current or ex partner: Not on file    Emotionally abused: Not on file    Physically abused: Not on file    Forced sexual activity: Not on file  Other Topics Concern  . Not on file  Social History Narrative  . Not on file     Vitals:   10/18/18 1417  BP: 128/80  Pulse: 63  SpO2: 99%  Weight: 204 lb 3.2 oz (92.6 kg)  Height: 5\' 11"  (1.803 m)    Wt Readings from Last 3 Encounters:  10/18/18 204 lb 3.2 oz (92.6 kg)  04/09/18 217 lb (98.4 kg)  07/09/17 211 lb (95.7 kg)     PHYSICAL EXAM General: NAD HEENT: Normal. Neck: No JVD, no thyromegaly. Lungs: Clear to auscultation bilaterally with normal respiratory effort. CV: Regular rate and rhythm, normal S1/S2, no S3/S4, no murmur. No pretibial or periankle edema.  No carotid bruit.   Abdomen: Soft, nontender, no distention.  Neurologic: Alert and oriented.  Psych: Normal affect. Skin: Normal. Musculoskeletal: No gross deformities.    ECG: ECG performed in the office today which I ordered and personally interpreted demonstrates normal sinus rhythm with no ischemic ST segment or T-wave abnormalities, nor any arrhythmias.    Labs: Lab Results  Component  Value Date/Time   K 5.3 10/03/2016 11:07 AM   BUN 32 (H) 10/03/2016 11:07 AM   CREATININE 1.38 (H) 10/03/2016 11:07 AM   TSH 0.361 09/20/2016 09:19 AM   HGB 13.6 10/03/2016 11:07 AM     Lipids: Lab Results  Component Value Date/Time   LDLCALC 77 09/18/2016 08:10 AM   CHOL 117 09/18/2016 08:10 AM   TRIG 56 09/18/2016 08:10 AM   HDL 29 (L) 09/18/2016 08:10 AM       ASSESSMENT AND PLAN:  1.  Coronary artery disease: He has a history of CABGwith NSTEMI(July 2018)and occlusion of SVG-OM1.   Symptomatically stable since last visit.  He wants to stop Imdur as it has not helped and I will do so.  Continue aspirin, atorvastatin, and Lopressor.  2. Bilateral carotid artery stenosis: Moderate in severity bilaterally (40-59%) in June 2020.I will repeatDopplers in June 2021.Continue statin.  3. Essential HTN: BP is normal.  No changes to therapy.  4.Dyslipidemia: Continue Lipitor 40 mg.   LDL 63 on 04/10/2018.  5.Paroxysmal atrial fibrillation: Currently insinusrhythm. Continue metoprolol and Eliquis.No changes to therapy.    Disposition: Follow up 6 months   Prentice Docker, M.D., F.A.C.C.

## 2018-10-18 NOTE — Patient Instructions (Addendum)

## 2018-12-13 ENCOUNTER — Other Ambulatory Visit: Payer: Self-pay | Admitting: Cardiovascular Disease

## 2019-03-06 ENCOUNTER — Other Ambulatory Visit: Payer: Self-pay | Admitting: Cardiovascular Disease

## 2019-03-06 MED ORDER — APIXABAN 5 MG PO TABS: 5.0000 mg | ORAL_TABLET | Freq: Two times a day (BID) | ORAL | 3 refills | Status: DC

## 2019-03-06 NOTE — Telephone Encounter (Signed)
Pt aware that Eliquis is not available generic as of yet - refills sent to [facility] as requested

## 2019-03-06 NOTE — Telephone Encounter (Signed)
Asking if patient can be switch to the generic for Eliquis.  Also needing refills sent in for this

## 2019-05-30 ENCOUNTER — Telehealth: Payer: Self-pay | Admitting: Cardiovascular Disease

## 2019-05-30 NOTE — Telephone Encounter (Signed)
  Patient Consent for Virtual Visit         William Hatfield has provided verbal consent on 05/30/2019 for a virtual visit (video or telephone).   CONSENT FOR VIRTUAL VISIT FOR:  William Hatfield  By participating in this virtual visit I agree to the following:  I hereby voluntarily request, consent and authorize [facility] and its employed or contracted physicians, physician assistants, nurse practitioners or other licensed health care professionals (the Practitioner), to provide me with telemedicine health care services (the "Services") as deemed necessary by the treating Practitioner. I acknowledge and consent to receive the Services by the Practitioner via telemedicine. I understand that the telemedicine visit will involve communicating with the Practitioner through live audiovisual communication technology and the disclosure of certain medical information by electronic transmission. I acknowledge that I have been given the opportunity to request an in-person assessment or other available alternative prior to the telemedicine visit and am voluntarily participating in the telemedicine visit.  I understand that I have the right to withhold or withdraw my consent to the use of telemedicine in the course of my care at any time, without affecting my right to future care or treatment, and that the Practitioner or I may terminate the telemedicine visit at any time. I understand that I have the right to inspect all information obtained and/or recorded in the course of the telemedicine visit and may receive copies of available information for a reasonable fee.  I understand that some of the potential risks of receiving the Services via telemedicine include:  Marland Kitchen Delay or interruption in medical evaluation due to technological equipment failure or disruption; . Information transmitted may not be sufficient (e.g. poor resolution of images) to allow for appropriate medical decision making by the Practitioner;  and/or  . In rare instances, security protocols could fail, causing a breach of personal health information.  Furthermore, I acknowledge that it is my responsibility to provide information about my medical history, conditions and care that is complete and accurate to the best of my ability. I acknowledge that Practitioner's advice, recommendations, and/or decision may be based on factors not within their control, such as incomplete or inaccurate data provided by me or distortions of diagnostic images or specimens that may result from electronic transmissions. I understand that the practice of medicine is not an exact science and that Practitioner makes no warranties or guarantees regarding treatment outcomes. I acknowledge that a copy of this consent can be made available to me via my patient portal [facility] MyChart), or I can request a printed copy by calling the office of [facility].    I understand that my insurance will be billed for this visit.   I have read or had this consent read to me. . I understand the contents of this consent, which adequately explains the benefits and risks of the Services being provided via telemedicine.  . I have been provided ample opportunity to ask questions regarding this consent and the Services and have had my questions answered to my satisfaction. . I give my informed consent for the services to be provided through the use of telemedicine in my medical care

## 2019-06-05 ENCOUNTER — Encounter: Payer: Self-pay | Admitting: Cardiovascular Disease

## 2019-06-05 ENCOUNTER — Telehealth (INDEPENDENT_AMBULATORY_CARE_PROVIDER_SITE_OTHER): Payer: Medicare Other | Admitting: Cardiovascular Disease

## 2019-06-05 VITALS — BP 134/81 | Ht 71.0 in | Wt 212.0 lb

## 2019-06-05 DIAGNOSIS — I6523 Occlusion and stenosis of bilateral carotid arteries: Secondary | ICD-10-CM

## 2019-06-05 DIAGNOSIS — I480 Paroxysmal atrial fibrillation: Secondary | ICD-10-CM

## 2019-06-05 DIAGNOSIS — I25708 Atherosclerosis of coronary artery bypass graft(s), unspecified, with other forms of angina pectoris: Secondary | ICD-10-CM

## 2019-06-05 DIAGNOSIS — Z951 Presence of aortocoronary bypass graft: Secondary | ICD-10-CM

## 2019-06-05 DIAGNOSIS — I10 Essential (primary) hypertension: Secondary | ICD-10-CM | POA: Diagnosis not present

## 2019-06-05 DIAGNOSIS — E785 Hyperlipidemia, unspecified: Secondary | ICD-10-CM | POA: Diagnosis not present

## 2019-06-05 NOTE — Progress Notes (Signed)
Virtual Visit via Telephone Note   This visit type was conducted due to national recommendations for restrictions regarding the COVID-19 Pandemic (e.g. social distancing) in an effort to limit this patient's exposure and mitigate transmission in our community.  Due to his co-morbid illnesses, this patient is at least at moderate risk for complications without adequate follow up.  This format is felt to be most appropriate for this patient at this time.  The patient did not have access to video technology/had technical difficulties with video requiring transitioning to audio format only (telephone).  All issues noted in this document were discussed and addressed.  No physical exam could be performed with this format.  Please refer to the patient's chart for his  consent to telehealth for [facility].   The patient was identified using 2 identifiers.  Date:  06/05/2019   ID:  William Hatfield, DOB 1946-01-28, MRN [id number]  Patient Location: Home Provider Location: Office  PCP:  System, Pcp Not In  Cardiologist:  Prentice Docker, MD  Electrophysiologist:  None   Evaluation Performed:  Follow-Up Visit  Chief Complaint: CAD, atrial fibrillation  History of Present Illness:    William Hatfield is a 74 y.o. male with a history of coronary artery disease (four-vessel CABG at [facility] in 2006) and paroxysmal atrial fibrillation.  He was hospitalized for a non-STEMI in July 2018. Coronary angiography on 09/18/16 demonstrated severe native three-vessel coronary artery disease with patent SVG to PLA, LIMA to LAD, and total occlusion of the SVG to OM1 with unsuccessful attempt at PCI of the SVG to OM1 with inability to restore flow in spite of extensive balloon angioplasty.   Echocardiogram demonstrated normal left ventricular systolic function LVEF 60-65%, with normal regional wall motion.  He denies chest pain, leg swelling, and palpitations.  Chronic exertional dyspnea is stable and  unchanged.  His wife is also my patient.   Past Medical History:  Diagnosis Date  . Atrial fibrillation (HCC)    a. post op in 2006  b. recurrence on 08/2016 admission for NSTEMI, place on eliquis  . CAD (coronary artery disease)    a. 2006: s/p CABG at [facility] with LIMA to LAD, SVG to PDA, SVG to OM1, SVG to D1  b. 08/2016: NSTEMI 09/18/16 which showed severe native 3V CAD with patent SVG-->PLA, LIMA-->LAD and total occlusion of SVG--> OM1 with unsuccessful attempt at PCI of SVG--> OM with inability to restore flow.   . Carotid artery disease (HCC)    Doppler, May, 2013, 60-79% bilateral, plan followup 6 months  . CKD (chronic kidney disease)   . Hypertension   . PAC (premature atrial contraction)    PACs... EKG... January, 2011..  . Tremor    Past Surgical History:  Procedure Laterality Date  . CORONARY ARTERY BYPASS GRAFT  2006   [facility]  . CORONARY BALLOON ANGIOPLASTY N/A 09/18/2016   Procedure: Coronary Balloon Angioplasty;  Surgeon: Tonny Bollman, MD;  Location: [facility] INVASIVE CV LAB;  Service: Cardiovascular;  Laterality: N/A;  . LEFT HEART CATH AND CORS/GRAFTS ANGIOGRAPHY N/A 09/18/2016   Procedure: Left Heart Cath and Cors/Grafts Angiography;  Surgeon: Tonny Bollman, MD;  Location: [facility] INVASIVE CV LAB;  Service: Cardiovascular;  Laterality: N/A;     Current Meds  Medication Sig  . apixaban (ELIQUIS) 5 MG TABS tablet Take 1 tablet (5 mg total) by mouth 2 (two) times daily.  Marland Kitchen aspirin EC 81 MG tablet Take 1 tablet (81 mg total) by mouth daily.  Marland Kitchen  atorvastatin (LIPITOR) 40 MG tablet TAKE 1 TABLET EVERY EVENING  . chlorthalidone (HYGROTON) 25 MG tablet TAKE 1 TABLET EVERY DAY  . cyanocobalamin 500 MCG tablet Take 500 mcg by mouth daily.  . metoprolol tartrate (LOPRESSOR) 50 MG tablet TAKE 1 TABLET TWICE DAILY  . Multiple Vitamin (MULTIVITAMIN) tablet Take 1 tablet by mouth daily.  . nitroGLYCERIN (NITROSTAT) 0.4 MG SL tablet Place 1 tablet (0.4 mg total) under the tongue every 5  (five) minutes x 3 doses as needed for chest pain.     Allergies:   Niaspan [niacin]   Social History   Tobacco Use  . Smoking status: Former Smoker    Packs/day: 4.00    Years: 20.00    Pack years: 80.00    Types: Cigarettes    Quit date: 02/28/1979    Years since quitting: 40.2  . Smokeless tobacco: Former User    Types: Snuff, Sarina Ser    Quit date: 02/27/1981  Substance Use Topics  . Alcohol use: No    Alcohol/week: 0.0 standard drinks  . Drug use: No     Family Hx: The patient's family history includes Heart attack in his father and paternal grandfather.  ROS:   Please see the history of present illness.     All other systems reviewed and are negative.   Prior CV studies:   The following studies were reviewed today:  Reviewed above  Labs/Other Tests and Data Reviewed:    EKG:  No ECG reviewed.  Recent Labs: No results found for requested labs within last 8760 hours.   Recent Lipid Panel Lab Results  Component Value Date/Time   CHOL 117 09/18/2016 08:10 AM   TRIG 56 09/18/2016 08:10 AM   HDL 29 (L) 09/18/2016 08:10 AM   CHOLHDL 4.0 09/18/2016 08:10 AM   LDLCALC 77 09/18/2016 08:10 AM    Wt Readings from Last 3 Encounters:  06/05/19 212 lb (96.2 kg)  10/18/18 204 lb 3.2 oz (92.6 kg)  04/09/18 217 lb (98.4 kg)     Objective:    Vital Signs:  BP 134/81   Ht 5\' 11"  (1.803 m)   Wt 212 lb (96.2 kg)   BMI 29.57 kg/m    VITAL SIGNS:  reviewed  ASSESSMENT & PLAN:    1.Coronary artery disease: He has a history of CABGwith NSTEMI(July 2018)and occlusion of SVG-OM1.  Symptomatically stable since last visit.  Continue atorvastatin and Lopressor.  I will stop aspirin as he is on Eliquis to minimize bleeding risk.  2. Bilateral carotid artery stenosis: Moderate in severity bilaterally (40-59%) in June2020.I will repeatDopplersin June 2021.Continue statin.  3. Essential HTN:BP is normal.  No changes to therapy.  4.Dyslipidemia: Continue  Lipitor 40 mg.  Goal LDL less than 70.  I will check lipids.  5.Paroxysmal atrial fibrillation: Symptomatically stable. Continue metoprolol and Eliquis.No changes to therapy.    COVID-19 Education: The signs and symptoms of COVID-19 were discussed with the patient and how to seek care for testing (follow up with PCP or arrange E-visit).  The importance of social distancing was discussed today.  Time:   Today, I have spent 20 minutes with the patient with telehealth technology discussing the above problems.     Medication Adjustments/Labs and Tests Ordered: Current medicines are reviewed at length with the patient today.  Concerns regarding medicines are outlined above.   Tests Ordered: No orders of the defined types were placed in this encounter.   Medication Changes: No orders of the defined types  were placed in this encounter.   Follow Up:  In Person in 6 month(s)  Signed, Prentice Docker, MD  06/05/2019 10:52 AM    [facility]

## 2019-06-05 NOTE — Addendum Note (Signed)
Addended by: Burman Nieves T on: 06/05/2019 11:12 AM   Modules accepted: Orders

## 2019-06-05 NOTE — Patient Instructions (Addendum)
Your physician wants you to follow-up in: 6 MONTHS WITH DR Reggy Eye will receive a reminder letter in the mail two months in advance. If you don't receive a letter, please call our office to schedule the follow-up appointment.  Your physician has recommended you make the following change in your medication:   STOP ASPIRIN   Your physician has requested that you have a carotid duplex IN June 2021. This test is an ultrasound of the carotid arteries in your neck. It looks at blood flow through these arteries that supply the brain with blood. Allow one hour for this exam. There are no restrictions or special instructions.  Your physician recommends that you return for lab work LIPIDS - WE HAVE ATTACHED ORDERS - PLEASE FAST 6-8 HOURS PRIOR TO LAB WORK   Thank you for choosing [facility]!!

## 2019-07-22 ENCOUNTER — Telehealth: Payer: Self-pay | Admitting: *Deleted

## 2019-07-22 NOTE — Telephone Encounter (Signed)
William Hatfield, [place]  04/13/8725 61:84 AM EDT    Notified via detailed voice message. pcp not in system.

## 2019-07-22 NOTE — Telephone Encounter (Signed)
-----   Message from Laqueta Linden, MD sent at 07/10/2019  9:31 AM EDT ----- Fairly good results

## 2019-08-14 ENCOUNTER — Telehealth: Payer: Self-pay | Admitting: Orthopedic Surgery

## 2019-08-14 ENCOUNTER — Other Ambulatory Visit: Payer: Self-pay | Admitting: Cardiovascular Disease

## 2019-08-14 ENCOUNTER — Ambulatory Visit (INDEPENDENT_AMBULATORY_CARE_PROVIDER_SITE_OTHER): Payer: Medicare Other

## 2019-08-14 DIAGNOSIS — I6523 Occlusion and stenosis of bilateral carotid arteries: Secondary | ICD-10-CM

## 2019-08-14 NOTE — Telephone Encounter (Signed)
Voice message, then call, received from patient relaying that his cardiologist Dr Purvis Sheffield and he discussed his knee and that he's been previously told by an orthopaedic doctor that he needs a knee replacement. Said last seen by ortho 2 years ago. Relayed protocol of 2nd opinion including obtaining related office notes, imaging reports, and films, for Dr Romeo Apple to review. Aware to call back when he has in hand.

## 2019-08-26 ENCOUNTER — Telehealth: Payer: Self-pay | Admitting: *Deleted

## 2019-08-26 NOTE — Telephone Encounter (Signed)
-----   Message from Antoine Poche, MD sent at 08/19/2019  3:12 PM EDT ----- Carotid US shows moderate bilateral blockages, nothing of concern at this time just something to continue to monitor   Dominga Ferry MD

## 2019-08-26 NOTE — Telephone Encounter (Signed)
Lesle Chris, LPN  06/10/2438 10:27 AM EDT Back to Top    Wife Corrie Dandy) notified. Copy to pcp.

## 2019-09-15 ENCOUNTER — Other Ambulatory Visit: Payer: Self-pay | Admitting: *Deleted

## 2019-09-15 MED ORDER — APIXABAN 5 MG PO TABS: 5.0000 mg | ORAL_TABLET | Freq: Two times a day (BID) | ORAL | 6 refills | Status: DC

## 2019-09-22 ENCOUNTER — Other Ambulatory Visit: Payer: Self-pay

## 2019-09-22 ENCOUNTER — Ambulatory Visit: Payer: Medicare Other

## 2019-09-22 ENCOUNTER — Encounter: Payer: Self-pay | Admitting: Orthopedic Surgery

## 2019-09-22 ENCOUNTER — Ambulatory Visit (INDEPENDENT_AMBULATORY_CARE_PROVIDER_SITE_OTHER): Payer: Medicare Other | Admitting: Orthopedic Surgery

## 2019-09-22 VITALS — BP 176/95 | HR 66 | Ht 71.0 in | Wt 211.0 lb

## 2019-09-22 DIAGNOSIS — G8929 Other chronic pain: Secondary | ICD-10-CM | POA: Diagnosis not present

## 2019-09-22 DIAGNOSIS — I6523 Occlusion and stenosis of bilateral carotid arteries: Secondary | ICD-10-CM | POA: Diagnosis not present

## 2019-09-22 DIAGNOSIS — M25562 Pain in left knee: Secondary | ICD-10-CM

## 2019-09-22 NOTE — Progress Notes (Addendum)
Patient ID: William Hatfield, male   DOB: [birth year], 74 y.o.   MRN: [id number]  Chief Complaint  Patient presents with  . Knee Pain    left x 2 years     74 year old male 2-year history of left knee pain with giving way previously had 3 injections last injection about a year ago.  Has tried some over-the-counter bracing knee still gives way he is concerned about frequent falls  Most of his pain is located over the medial joint line  Patient has a severe cardiac history as listed   Review of Systems  HENT: Positive for hearing loss and tinnitus.   Cardiovascular: Positive for chest pain, palpitations and orthopnea.  Musculoskeletal: Positive for back pain, joint pain and myalgias.  Neurological: Positive for tremors.  Endo/Heme/Allergies: Bruises/bleeds easily.  All other systems reviewed and are negative.   Past Medical History:  Diagnosis Date  . Atrial fibrillation (HCC)    a. post op in 2006  b. recurrence on 08/2016 admission for NSTEMI, place on eliquis  . CAD (coronary artery disease)    a. 2006: s/p CABG at [facility] with LIMA to LAD, SVG to PDA, SVG to OM1, SVG to D1  b. 08/2016: NSTEMI 09/18/16 which showed severe native 3V CAD with patent SVG-->PLA, LIMA-->LAD and total occlusion of SVG--> OM1 with unsuccessful attempt at PCI of SVG--> OM with inability to restore flow.   . Carotid artery disease (HCC)    Doppler, May, 2013, 60-79% bilateral, plan followup 6 months  . CKD (chronic kidney disease)   . Hypertension   . PAC (premature atrial contraction)    PACs... EKG... January, 2011..  . Tremor     Past Surgical History:  Procedure Laterality Date  . CORONARY ARTERY BYPASS GRAFT  2006   [facility]  . CORONARY BALLOON ANGIOPLASTY N/A 09/18/2016   Procedure: Coronary Balloon Angioplasty;  Surgeon: Tonny Bollman, MD;  Location: [facility] INVASIVE CV LAB;  Service: Cardiovascular;  Laterality: N/A;  . LEFT HEART CATH AND CORS/GRAFTS ANGIOGRAPHY N/A 09/18/2016   Procedure: Left Heart Cath  and Cors/Grafts Angiography;  Surgeon: Tonny Bollman, MD;  Location: [facility] INVASIVE CV LAB;  Service: Cardiovascular;  Laterality: N/A;    PHYSICAL EXAM  BP (!) 176/95   Pulse 66   Ht 5\' 11"  (1.803 m)   Wt (!) 211 lb (95.7 kg)   BMI 29.43 kg/m  GENERAL appearance reveals no gross abnormalities, normal development grooming and hygiene   MENTAL STATUS we note that the patient is awake alert and oriented to person place and time MOOD/AFFECT ARE NORMAL   GAIT reveals left leg, limp in the effected limb  Right Knee Exam   Muscle Strength  The patient has normal right knee strength.  Range of Motion  The patient has normal right knee ROM.  Tests  Lachman:  Anterior - negative    Posterior - negative Drawer:  Anterior - negative    Posterior - negative  Other  Erythema: absent Scars: absent Sensation: normal Pulse: present Swelling: none Effusion: no effusion present   Left Knee Exam   Muscle Strength  The patient has normal left knee strength.  Tenderness  The patient is experiencing tenderness in the medial joint line.  Range of Motion  Extension: 5  Flexion: 110   Tests  Drawer:  Anterior - negative     Posterior - negative  Other  Erythema: absent Scars: absent Sensation: normal Pulse: present Swelling: none Effusion: no effusion present  VASC 2+ dorsalis pedis pulse normal capillary refill excellent warmth to the extremity  NEURO normal sensation and no pathologic reflexes  LYMPH deferred noncontributory  MEDICAL DECISION MAKING  A.  Encounter Diagnosis  Name Primary?  . Chronic pain of left knee Yes    B. DATA ANALYSED:    IMAGING: Independent interpretation of images: Internal x-rays show varus knee with extruded meniscus calcification narrowing of the medial compartment with moderately severe arthritis  Orders: None  Outside records reviewed: None  C. MANAGEMENT  Patient has severe heart disease he says the back of his heart  is completely gone he is on Eliquis he has atrial fibrillation has multiple procedures listed above  He is a poor risk for surgery  Were going to try 1 injection and if that does not work were going to refer him to orthopedist at [employer] to see if he can have total knee replacement   Procedure note left knee injection   verbal consent was obtained to inject left knee joint  Timeout was completed to confirm the site of injection  The medications used were 40 mg of Depo-Medrol and 1% lidocaine 3 cc  Anesthesia was provided by ethyl chloride and the skin was prepped with alcohol.  After cleaning the skin with alcohol a 20-gauge needle was used to inject the left knee joint. There were no complications. A sterile bandage was applied.    Follow-up in 6 weeks  No orders of the defined types were placed in this encounter.

## 2019-10-03 ENCOUNTER — Other Ambulatory Visit: Payer: Self-pay | Admitting: *Deleted

## 2019-10-03 MED ORDER — ATORVASTATIN CALCIUM 40 MG PO TABS: 40.0000 mg | ORAL_TABLET | Freq: Every evening | ORAL | 1 refills | Status: DC

## 2019-10-03 MED ORDER — CHLORTHALIDONE 25 MG PO TABS: 25.0000 mg | ORAL_TABLET | Freq: Every day | ORAL | 1 refills | Status: DC

## 2019-10-06 ENCOUNTER — Other Ambulatory Visit: Payer: Self-pay | Admitting: *Deleted

## 2019-10-06 MED ORDER — METOPROLOL TARTRATE 50 MG PO TABS: 50.0000 mg | ORAL_TABLET | Freq: Two times a day (BID) | ORAL | 1 refills | Status: DC

## 2019-11-06 ENCOUNTER — Encounter: Payer: Self-pay | Admitting: Orthopedic Surgery

## 2019-11-06 ENCOUNTER — Other Ambulatory Visit: Payer: Self-pay

## 2019-11-06 ENCOUNTER — Ambulatory Visit (INDEPENDENT_AMBULATORY_CARE_PROVIDER_SITE_OTHER): Payer: Medicare Other | Admitting: Orthopedic Surgery

## 2019-11-06 VITALS — BP 166/77 | HR 62 | Ht 71.0 in | Wt 211.0 lb

## 2019-11-06 DIAGNOSIS — M25562 Pain in left knee: Secondary | ICD-10-CM | POA: Diagnosis not present

## 2019-11-06 DIAGNOSIS — G8929 Other chronic pain: Secondary | ICD-10-CM | POA: Diagnosis not present

## 2019-11-06 NOTE — Progress Notes (Signed)
Chief Complaint  Patient presents with  . Knee Pain    left/ was improving but pain returned    74 year old male with pretty severe heart disease status post injection back in July with good result pain recently returned he would like another injection by the water problem  Procedure note left knee injection   verbal consent was obtained to inject left knee joint  Timeout was completed to confirm the site of injection  The medications used were 40 mg of Depo-Medrol and 1% lidocaine 3 cc  Anesthesia was provided by ethyl chloride and the skin was prepped with alcohol.  After cleaning the skin with alcohol a 20-gauge needle was used to inject the left knee joint. There were no complications. A sterile bandage was applied.  Encounter Diagnosis  Name Primary?  . Chronic pain of left knee Yes

## 2019-11-06 NOTE — Patient Instructions (Signed)

## 2019-12-03 ENCOUNTER — Encounter: Payer: Self-pay | Admitting: Cardiology

## 2019-12-03 ENCOUNTER — Ambulatory Visit (INDEPENDENT_AMBULATORY_CARE_PROVIDER_SITE_OTHER): Payer: Medicare Other | Admitting: Cardiology

## 2019-12-03 VITALS — BP 122/64 | HR 41 | Ht 71.0 in | Wt 207.0 lb

## 2019-12-03 DIAGNOSIS — I6523 Occlusion and stenosis of bilateral carotid arteries: Secondary | ICD-10-CM | POA: Diagnosis not present

## 2019-12-03 DIAGNOSIS — I480 Paroxysmal atrial fibrillation: Secondary | ICD-10-CM

## 2019-12-03 DIAGNOSIS — I441 Atrioventricular block, second degree: Secondary | ICD-10-CM

## 2019-12-03 DIAGNOSIS — E782 Mixed hyperlipidemia: Secondary | ICD-10-CM

## 2019-12-03 DIAGNOSIS — I25119 Atherosclerotic heart disease of native coronary artery with unspecified angina pectoris: Secondary | ICD-10-CM | POA: Diagnosis not present

## 2019-12-03 DIAGNOSIS — Z79899 Other long term (current) drug therapy: Secondary | ICD-10-CM

## 2019-12-03 MED ORDER — METOPROLOL TARTRATE 25 MG PO TABS: 25.0000 mg | ORAL_TABLET | Freq: Two times a day (BID) | ORAL | 3 refills | Status: DC

## 2019-12-03 NOTE — Progress Notes (Signed)
Cardiology Office Note  Date: 12/03/2019   ID: William Hatfield, DOB 11/09/1945, MRN [id number]  PCP:  Pcp, No  Cardiologist:  Nona Dell, MD Electrophysiologist:  None   Chief Complaint  Patient presents with  . Cardiac follow-up    History of Present Illness: William Hatfield is a 74 y.o. male former patient of Dr. Purvis Sheffield now presenting to establish follow-up with me.  I reviewed her records and updated the chart.  His last encounter was in April.  He is here today with his wife for follow-up visit.  He reports that he has been having dyspnea on exertion and fatigue for the last 5 months, has to stop and rest.  He has used nitroglycerin occasionally.  He does not report any syncope.  I reviewed his medications which are outlined below.  He does not report any major change in regimen, no bleeding problems on Eliquis.  Carotid Dopplers from June showed moderate bilateral ICA stenoses.  We discussed plan for follow-up lab work on Eliquis.  LDL was 76 back in May of this year.  I personally reviewed his ECG today which shows second and third-degree heart block with right bundle branch block and left anterior fascicular block, new in comparison to the previous tracing from August 2020.  Past Medical History:  Diagnosis Date  . Atrial fibrillation (HCC)    a. post op in 2006  b. recurrence on 08/2016 admission for NSTEMI  . CAD (coronary artery disease)    a. 2006: s/p CABG at [facility] with LIMA to LAD, SVG to PDA, SVG to OM1, SVG to D1  b. 08/2016: NSTEMI 09/18/16 which showed severe native 3V CAD with patent SVG-->PLA, LIMA-->LAD and total occlusion of SVG--> OM1 with unsuccessful attempt at PCI of SVG--> OM with inability to restore flow.   . Carotid artery disease (HCC)   . CKD (chronic kidney disease)   . Essential hypertension   . Tremor     Past Surgical History:  Procedure Laterality Date  . CORONARY ARTERY BYPASS GRAFT  2006   [facility]  . CORONARY BALLOON ANGIOPLASTY  N/A 09/18/2016   Procedure: Coronary Balloon Angioplasty;  Surgeon: Tonny Bollman, MD;  Location: [facility] INVASIVE CV LAB;  Service: Cardiovascular;  Laterality: N/A;  . LEFT HEART CATH AND CORS/GRAFTS ANGIOGRAPHY N/A 09/18/2016   Procedure: Left Heart Cath and Cors/Grafts Angiography;  Surgeon: Tonny Bollman, MD;  Location: [facility] INVASIVE CV LAB;  Service: Cardiovascular;  Laterality: N/A;    Current Outpatient Medications  Medication Sig Dispense Refill  . apixaban (ELIQUIS) 5 MG TABS tablet Take 1 tablet (5 mg total) by mouth 2 (two) times daily. 60 tablet 6  . atorvastatin (LIPITOR) 40 MG tablet Take 1 tablet (40 mg total) by mouth every evening. 90 tablet 1  . chlorthalidone (HYGROTON) 25 MG tablet Take 1 tablet (25 mg total) by mouth daily. 90 tablet 1  . cyanocobalamin 500 MCG tablet Take 500 mcg by mouth daily.    . Multiple Vitamin (MULTIVITAMIN) tablet Take 1 tablet by mouth daily.    . nitroGLYCERIN (NITROSTAT) 0.4 MG SL tablet Place 1 tablet (0.4 mg total) under the tongue every 5 (five) minutes x 3 doses as needed for chest pain. 25 tablet 0  . metoprolol tartrate (LOPRESSOR) 25 MG tablet Take 1 tablet (25 mg total) by mouth 2 (two) times daily. 180 tablet 3   No current facility-administered medications for this visit.   Allergies:  Niaspan [niacin]   Social History: The  patient  reports that he quit smoking about 40 years ago. His smoking use included cigarettes. He has a 80.00 pack-year smoking history. He quit smokeless tobacco use about 38 years ago.  His smokeless tobacco use included snuff and chew. He reports that he does not drink alcohol and does not use drugs.   Family History: The patient's family history includes Heart attack in his father and paternal grandfather.   ROS:   No syncope.  Chronic left knee pain.  Physical Exam: VS:  BP 122/64   Pulse (!) 41   Ht 5\' 11"  (1.803 m)   Wt 207 lb (93.9 kg)   SpO2 98%   BMI 28.87 kg/m , BMI Body mass index is 28.87  kg/m.  Wt Readings from Last 3 Encounters:  12/03/19 207 lb (93.9 kg)  11/06/19 211 lb (95.7 kg)  09/22/19 (!) 211 lb (95.7 kg)    General: Patient appears comfortable at rest. HEENT: Conjunctiva and lids normal, wearing a mask. Neck: Supple, no elevated JVP or carotid bruits, no thyromegaly. Lungs: Clear to auscultation, nonlabored breathing at rest. Cardiac: Regular rate and rhythm, no S3, soft systolic murmur. Abdomen: Soft, nontender, bowel sounds present. Extremities: Brace on left knee. Skin: Warm and dry. Musculoskeletal: No kyphosis. Neuropsychiatric: Alert and oriented x3, affect grossly appropriate.  ECG:  An ECG dated 10/18/2018 was personally reviewed today and demonstrated:  Sinus rhythm with nonspecific T wave changes.  Recent Labwork:  May 2021: Cholesterol 127, triglycerides 113, HDL 30, LDL 76  Other Studies Reviewed Today:  Carotid Dopplers 08/14/2019: Summary:  Right Carotid: Velocities in the right ICA are consistent with a 40-59%         stenosis. The ECA appears >50% stenosed.   Left Carotid: Velocities in the left ICA are consistent with a 40-59%  stenosis.        The ECA appears >50% stenosed.   Vertebrals: Bilateral vertebral arteries demonstrate antegrade flow.  Subclavians: Normal flow hemodynamics were seen in bilateral subclavian        arteries.   Assessment and Plan:  1.  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  Lopressor will be cut back to 25 mg twice daily and may need to be discontinued altogether.  Follow-up nurse visit with ECG is scheduled.  2.  Multivessel CAD status post CABG with documented graft disease, known occlusion of the SVG to OM1 that has been managed medically.  He has had intermittent angina symptoms although in the setting of problem #1.  Currently not on aspirin given use of  Eliquis.  Continue statin therapy.  3.  Paroxysmal atrial fibrillation with CHA2DS2-VASc score of 3.  He is on Eliquis for stroke prophylaxis, check CBC and BMET.  Beta-blocker is being reduced as noted above and may ultimately need to be discontinued.  4.  Moderate carotid artery disease, asymptomatic.  Continue statin therapy.  Follow-up carotid Dopplers for next June.  5.  Mixed hyperlipidemia, currently on Lipitor with last LDL 76.  Medication Adjustments/Labs and Tests Ordered: Current medicines are reviewed at length with the patient today.  Concerns regarding medicines are outlined above.   Tests Ordered: Orders Placed This Encounter  Procedures  . CBC  . Basic metabolic panel  . EKG 12-Lead    Medication Changes: Meds ordered this encounter  Medications  . metoprolol tartrate (LOPRESSOR) 25 MG tablet    Sig: Take 1  tablet (25 mg total) by mouth 2 (two) times daily.    Dispense:  180 tablet    Refill:  3    12/03/2019 dose decrease    Disposition:  Follow up repeat ECG with nurse visit following reduction in beta-blocker with further plans to follow.  Signed, Jonelle Sidle, MD, [facility] 12/03/2019 2:37 PM    [facility] at [facility] [address] [place], [place], [place] [address] Phone: [phone]; Fax: [phone]

## 2019-12-03 NOTE — Patient Instructions (Addendum)
Medication Instructions:   Your physician has recommended you make the following change in your medication:   Decrease metoprolol tartrate to 25 mg by mouth twice daily.   Continue other medications the same  Labwork:  Your physician recommends that you return for non-fasting lab work in: TODAY to check your BMET & CBC. This may be done at [facility] from 8:00 am - 4:00 pm. No appointment is needed.  Testing/Procedures:  None  Follow-Up:  Your physician recommends that you schedule a follow-up appointment in: 6 months.  Your physician recommends that you schedule a follow-up appointment in: 1 week for a nurse visit to have your EKG rechecked.   Any Other Special Instructions Will Be Listed Below (If Applicable).  If you need a refill on your cardiac medications before your next appointment, please call your pharmacy.

## 2019-12-05 ENCOUNTER — Telehealth: Payer: Self-pay | Admitting: *Deleted

## 2019-12-05 NOTE — Telephone Encounter (Signed)
-----   Message from Jonelle Sidle, MD sent at 12/05/2019  8:21 AM EDT ----- Results reviewed.  Creatinine 1.44 and hemoglobin normal at 15.3.  Continue current dose of Eliquis.

## 2019-12-05 NOTE — Telephone Encounter (Signed)
Patient's wife informed

## 2019-12-10 ENCOUNTER — Ambulatory Visit (INDEPENDENT_AMBULATORY_CARE_PROVIDER_SITE_OTHER): Payer: Medicare Other | Admitting: *Deleted

## 2019-12-10 DIAGNOSIS — I480 Paroxysmal atrial fibrillation: Secondary | ICD-10-CM

## 2019-12-10 NOTE — Progress Notes (Signed)
Pt voiced understanding - updated medication list - scheduled pt 10/27 @ 1140

## 2019-12-10 NOTE — Progress Notes (Signed)
Pt here for repeat EKG after decreasing metoprolol - pt says decreased dose has helped with stamina and more energy - EKG done and will forward to provider

## 2019-12-10 NOTE — Addendum Note (Signed)
Addended by: Burman Nieves T on: 12/10/2019 01:13 PM   Modules accepted: Orders

## 2019-12-10 NOTE — Progress Notes (Signed)
I reviewed his tracing which still shows evidence of second-degree type II heart block and bradycardia.  Go ahead and stop Lopressor altogether.  We need to schedule an office visit with me or [place] in the next 2 weeks for reevaluation.

## 2019-12-15 ENCOUNTER — Ambulatory Visit: Payer: Medicare Other | Admitting: Cardiovascular Disease

## 2019-12-23 NOTE — Progress Notes (Signed)
Cardiology Office Note  Date: 12/24/2019   ID: [name], [name] 1945/09/17, MRN [id number]  PCP:  Pcp, No  Cardiologist:  Rozann Lesches, MD Electrophysiologist:  None   Chief Complaint  Patient presents with  . Cardiac follow-up    History of Present Illness: William Hatfield is a 74 y.o. male that I met recently in clinic, a former patient of Dr. Bronson Ing.  He is here today with his wife for a follow-up visit.  He has been taken off beta-blocker completely given presence of recently documented second and third-degree heart block by ECG, duration uncertain.  He has not noticed a tremendous difference so far.  Still no sudden dizziness or syncope.  I personally reviewed his follow-up ECG today which shows sinus rhythm with a 2-1 heart block and IVCD so there has been some relative improvement in AV conduction.  He does not report any active angina, no nitroglycerin use, otherwise continues on his medications as outlined below.  He does tell me that he was rushing from a job site in to see one of his providers for routine evaluation and when he got there his heart rate was in the 90s, settled down into the 40s when he was seated.  Past Medical History:  Diagnosis Date  . Atrial fibrillation ([place])    a. post op in 2006  b. recurrence on 08/2016 admission for NSTEMI  . CAD (coronary artery disease)    a. 2006: s/p CABG at [facility] with LIMA to LAD, SVG to PDA, SVG to OM1, SVG to D1  b. 08/2016: NSTEMI 09/18/16 which showed severe native 3V CAD with patent SVG-->PLA, LIMA-->LAD and total occlusion of SVG--> OM1 with unsuccessful attempt at PCI of SVG--> OM with inability to restore flow.   . Carotid artery disease ([place])   . CKD (chronic kidney disease)   . Essential hypertension   . Tremor     Past Surgical History:  Procedure Laterality Date  . CORONARY ARTERY BYPASS GRAFT  2006   [facility]  . CORONARY BALLOON ANGIOPLASTY N/A 09/18/2016   Procedure: Coronary Balloon Angioplasty;   Surgeon: Sherren Mocha, MD;  Location: [place] [facility];  Service: Cardiovascular;  Laterality: N/A;  . LEFT HEART CATH AND CORS/GRAFTS ANGIOGRAPHY N/A 09/18/2016   Procedure: Left Heart Cath and Cors/Grafts Angiography;  Surgeon: Sherren Mocha, MD;  Location: [place] [facility];  Service: Cardiovascular;  Laterality: N/A;    Current Outpatient Medications  Medication Sig Dispense Refill  . apixaban (ELIQUIS) 5 MG TABS tablet Take 1 tablet (5 mg total) by mouth 2 (two) times daily. 60 tablet 6  . atorvastatin (LIPITOR) 40 MG tablet Take 1 tablet (40 mg total) by mouth every evening. 90 tablet 1  . chlorthalidone (HYGROTON) 25 MG tablet Take 1 tablet (25 mg total) by mouth daily. 90 tablet 1  . cyanocobalamin 500 MCG tablet Take 500 mcg by mouth daily.    . Multiple Vitamin (MULTIVITAMIN) tablet Take 1 tablet by mouth daily.    . nitroGLYCERIN (NITROSTAT) 0.4 MG SL tablet Place 1 tablet (0.4 mg total) under the tongue every 5 (five) minutes x 3 doses as needed for chest pain. 25 tablet 0   No current facility-administered medications for this visit.   Allergies:  Niaspan [niacin]   ROS: No syncope.  Physical Exam: VS:  BP 140/72   Pulse (!) 45   Ht _0  (1.803 m)   Wt 204 lb (92.5 kg)   SpO2 98%  BMI 28.45 kg/m , BMI Body mass index is 28.45 kg/m.  Wt Readings from Last 3 Encounters:  12/24/19 204 lb (92.5 kg)  12/03/19 207 lb (93.9 kg)  11/06/19 211 lb (95.7 kg)    General: Patient appears comfortable at rest. HEENT: Conjunctiva and lids normal, oropharynx clear with moist mucosa. Neck: Supple, no elevated JVP or carotid bruits, no thyromegaly. Lungs: Clear to auscultation, nonlabored breathing at rest. Cardiac: Regular rate and rhythm, no S3, soft systolic murmur, no pericardial rub. Extremities: No pitting edema, brace on left knee.  ECG:  An ECG dated 12/10/2019 was personally reviewed today and demonstrated:  Sinus bradycardia with second-degree type II heart  block, right bundle branch block.  Recent Labwork:  May 2021: Cholesterol 127, triglycerides 113, HDL 30, LDL 76 October 2021: Potassium 3.7, BUN 28, creatinine 1.44, hemoglobin 15.3, platelets 231  Other Studies Reviewed Today:  Carotid Dopplers 08/14/2019: Summary:  Right Carotid: Velocities in the right ICA are consistent with a 40-59%         stenosis. The ECA appears >50% stenosed.   Left Carotid: Velocities in the left ICA are consistent with a 40-59%  stenosis.        The ECA appears >50% stenosed.   Vertebrals: Bilateral vertebral arteries demonstrate antegrade flow.  Subclavians: Normal flow hemodynamics were seen in bilateral subclavian        arteries.   Assessment and Plan:  1.  Second-degree heart block, currently 2:1 with heart rate in the mid 40s at rest.  We have taken him off Lopressor completely.  It sounds like he still mounts somewhat of a heart rate response with activity based on discussion above, but we we will plan to refer for EP consultation regarding question of benefit from pacemaker (even if deferred after period of observation).  We will obtain a 72-hour Zio patch to further investigate heart rate variability and degree of heart block.  Echocardiogram will be obtained to reassess LVEF.  2.  Multivessel CAD status post CABG with graft disease, known occlusion of the SVG to OM1 that has been managed medically.  He does not report any active angina or nitroglycerin use.  Not on aspirin given use of Eliquis.  Continue Lipitor and as needed nitroglycerin.  3.  Atrial fibrillation with CHA2DS2-VASc score of 3.  No recent documented atrial arrhythmia.  He remains on Eliquis for stroke prophylaxis.  I reviewed his recent lab work as outlined above.  Medication Adjustments/Labs and Tests Ordered: Current medicines are reviewed at length with the patient today.  Concerns regarding medicines are outlined above.   Tests Ordered: Orders Placed  This Encounter  Procedures  . Ambulatory referral to Cardiac Electrophysiology  . LONG TERM MONITOR (3-14 DAYS)  . EKG 12-Lead  . ECHOCARDIOGRAM COMPLETE    Medication Changes: No orders of the defined types were placed in this encounter.   Disposition:  Follow up 3 months in the [place] office.  Signed, Satira Sark, MD, [facility] 12/24/2019 11:54 AM    [facility] [address], [place], [place] [address] Phone: [phone]; Fax: [phone]

## 2019-12-24 ENCOUNTER — Other Ambulatory Visit (INDEPENDENT_AMBULATORY_CARE_PROVIDER_SITE_OTHER): Payer: Medicare Other

## 2019-12-24 ENCOUNTER — Ambulatory Visit (INDEPENDENT_AMBULATORY_CARE_PROVIDER_SITE_OTHER): Payer: Medicare Other | Admitting: Cardiology

## 2019-12-24 ENCOUNTER — Encounter: Payer: Self-pay | Admitting: Cardiology

## 2019-12-24 VITALS — BP 140/72 | HR 45 | Ht 71.0 in | Wt 204.0 lb

## 2019-12-24 DIAGNOSIS — I441 Atrioventricular block, second degree: Secondary | ICD-10-CM

## 2019-12-24 DIAGNOSIS — I25119 Atherosclerotic heart disease of native coronary artery with unspecified angina pectoris: Secondary | ICD-10-CM | POA: Diagnosis not present

## 2019-12-24 DIAGNOSIS — I480 Paroxysmal atrial fibrillation: Secondary | ICD-10-CM

## 2019-12-24 DIAGNOSIS — I6523 Occlusion and stenosis of bilateral carotid arteries: Secondary | ICD-10-CM

## 2019-12-24 NOTE — Patient Instructions (Addendum)
Medication Instructions:    Your physician recommends that you continue on your current medications as directed. Please refer to the Current Medication list given to you today.  Labwork:  None  Testing/Procedures: Your physician has requested that you have an echocardiogram. Echocardiography is a painless test that uses sound waves to create images of your heart. It provides your doctor with information about the size and shape of your heart and how well your heart's chambers and valves are working. This procedure takes approximately one hour. There are no restrictions for this procedure.  ZIO- Long Term Monitor Instructions   Your physician has requested you wear your ZIO patch monitor 3 days.   This is a single patch monitor.  Irhythm supplies one patch monitor per enrollment.  Additional stickers are not available.   Please do not apply patch if you will be having a Nuclear Stress Test, Echocardiogram, Cardiac CT, MRI, or Chest Xray during the time frame you would be wearing the monitor. The patch cannot be worn during these tests.  You cannot remove and re-apply the ZIO XT patch monitor.    Once you have received you monitor, please review enclosed instructions.  Your monitor has already been registered assigning a specific monitor serial # to you.   Applying the monitor   Shave hair from upper left chest.   Hold abrader disc by orange tab.  Rub abrader in 40 strokes over left upper chest as indicated in your monitor instructions.   Clean area with 4 enclosed alcohol pads .  Use all pads to assure are is cleaned thoroughly.  Let dry.   Apply patch as indicated in monitor instructions.  Patch will be place under collarbone on left side of chest with arrow pointing upward.   Rub patch adhesive wings for 2 minutes.Remove white label marked "1".  Remove white label marked "2".  Rub patch adhesive wings for 2 additional minutes.   While looking in a mirror, press and release button  in center of patch.  A small green light will flash 3-4 times .  This will be your only indicator the monitor has been turned on.     Do not shower for the first 24 hours.  You may shower after the first 24 hours.   Press button if you feel a symptom. You will hear a small click.  Record Date, Time and Symptom in the Patient Log Book.   When you are ready to remove patch, follow instructions on last 2 pages of Patient Log Book.  Stick patch monitor onto last page of Patient Log Book.   Place Patient Log Book in [place] box.  Use locking tab on box and tape box closed securely.  The [employer] and [employer] has [employer] on it.  Please place in mailbox as soon as possible.  Your physician should have your test results approximately 7 days after the monitor has been mailed back to [facility].   Call [facility] Customer Care at [phone] if you have questions regarding your ZIO XT patch monitor.  Call them immediately if you see an orange light blinking on your monitor.    If your monitor falls off in less than 4 days contact our Monitor department at [phone].  If your monitor becomes loose or falls off after 4 days call [employer] at [phone] for suggestions on securing your monitor.  Follow-Up:  Your physician recommends that you schedule a follow-up appointment in: 3 months.  You have been referred  to an EP (electrophysiologist) doctor.  Any Other Special Instructions Will Be Listed Below (If Applicable).  If you need a refill on your cardiac medications before your next appointment, please call your pharmacy.

## 2020-01-01 ENCOUNTER — Ambulatory Visit (HOSPITAL_COMMUNITY)
Admission: RE | Admit: 2020-01-01 | Discharge: 2020-01-01 | Disposition: A | Discharge status: Home / Self Care | Payer: Medicare Other | Source: Ambulatory Visit | Attending: Cardiology | Admitting: Cardiology

## 2020-01-01 ENCOUNTER — Other Ambulatory Visit (HOSPITAL_COMMUNITY)
Admission: RE | Admit: 2020-01-01 | Discharge: 2020-01-01 | Disposition: A | Discharge status: Home / Self Care | Payer: Medicare Other | Source: Ambulatory Visit | Attending: Internal Medicine | Admitting: Internal Medicine

## 2020-01-01 ENCOUNTER — Encounter: Payer: Self-pay | Admitting: Internal Medicine

## 2020-01-01 ENCOUNTER — Encounter: Payer: Self-pay | Admitting: *Deleted

## 2020-01-01 ENCOUNTER — Other Ambulatory Visit: Payer: Self-pay

## 2020-01-01 ENCOUNTER — Ambulatory Visit (INDEPENDENT_AMBULATORY_CARE_PROVIDER_SITE_OTHER): Payer: Medicare Other | Admitting: Internal Medicine

## 2020-01-01 VITALS — BP 172/78 | HR 53 | Ht 71.0 in | Wt 208.2 lb

## 2020-01-01 DIAGNOSIS — I480 Paroxysmal atrial fibrillation: Secondary | ICD-10-CM | POA: Insufficient documentation

## 2020-01-01 DIAGNOSIS — I441 Atrioventricular block, second degree: Secondary | ICD-10-CM

## 2020-01-01 DIAGNOSIS — I25119 Atherosclerotic heart disease of native coronary artery with unspecified angina pectoris: Secondary | ICD-10-CM | POA: Insufficient documentation

## 2020-01-01 LAB — ECHOCARDIOGRAM COMPLETE
AR max vel: 2.28 cm2
AV Area VTI: 2.79 cm2
AV Area mean vel: 2.76 cm2
AV Mean grad: 6.6 mmHg
AV Peak grad: 16.2 mmHg
Ao pk vel: 2.02 m/s
Area-P 1/2: 2.52 cm2
Height: 71 in
S' Lateral: 2.43 cm
Weight: 3331.2 oz

## 2020-01-01 LAB — CBC
HCT: 48.3 % (ref 39.0–52.0)
Hemoglobin: 15.3 g/dL (ref 13.0–17.0)
MCH: 28.3 pg (ref 26.0–34.0)
MCHC: 31.7 g/dL (ref 30.0–36.0)
MCV: 89.4 fL (ref 80.0–100.0)
Platelets: 272 10*3/uL (ref 150–400)
RBC: 5.4 MIL/uL (ref 4.22–5.81)
RDW: 14 % (ref 11.5–15.5)
WBC: 8.8 10*3/uL (ref 4.0–10.5)
nRBC: 0 % (ref 0.0–0.2)

## 2020-01-01 NOTE — Progress Notes (Signed)
*  PRELIMINARY RESULTS* Echocardiogram 2D Echocardiogram has been performed.  Stacey Drain 01/01/2020, 12:27 PM

## 2020-01-01 NOTE — Patient Instructions (Signed)
Medication Instructions:  Your physician recommends that you continue on your current medications as directed. Please refer to the Current Medication list given to you today.  *If you need a refill on your cardiac medications before your next appointment, please call your pharmacy*   Lab Work: Your physician recommends that you return for lab work in: Today   If you have labs (blood work) drawn today and your tests are completely normal, you will receive your results only by: Marland Kitchen MyChart Message (if you have MyChart) OR . A paper copy in the mail If you have any lab test that is abnormal or we need to change your treatment, we will call you to review the results.   Testing/Procedures: Your physician has recommended that you have a pacemaker inserted. A pacemaker is a small device that is placed under the skin of your chest or abdomen to help control abnormal heart rhythms. This device uses electrical pulses to prompt the heart to beat at a normal rate. Pacemakers are used to treat heart rhythms that are too slow. Wire (leads) are attached to the pacemaker that goes into the chambers of you heart. This is done in the hospital and usually requires and overnight stay. Please see the instruction sheet given to you today for more information.  Dates: Monday November 15             Thursday November 18             Monday November 22    Follow-Up: At [facility], you and your health needs are our priority.  As part of our continuing mission to provide you with exceptional heart care, we have created designated Provider Care Teams.  These Care Teams include your primary Cardiologist (physician) and Advanced Practice Providers (APPs -  Physician Assistants and Nurse Practitioners) who all work together to provide you with the care you need, when you need it.  We recommend signing up for the patient portal called "MyChart".  Sign up information is provided on this After Visit Summary.  MyChart is  used to connect with patients for Virtual Visits (Telemedicine).  Patients are able to view lab/test results, encounter notes, upcoming appointments, etc.  Non-urgent messages can be sent to your provider as well.   To learn more about what you can do with MyChart, go to [email or web address].    Your next appointment:    To be determined   The format for your next appointment:   In Person  Provider:   Lewayne Bunting, MD   Other Instructions Thank you for choosing [facility]!

## 2020-01-01 NOTE — Progress Notes (Signed)
HPI William Hatfield is referred today by Dr. Diona Browner for consideration for PM insertion. 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 He has not had frank syncope but has significant dyspnea with exertion. He has a 2D echo pending. His heart monitor is also pending.   Allergies  Allergen Reactions  . Niaspan [Niacin] Hives and Other (See Comments)    NIASPAN ONLY/REACTION: Itching, whelps     Current Outpatient Medications  Medication Sig Dispense Refill  . apixaban (ELIQUIS) 5 MG TABS tablet Take 1 tablet (5 mg total) by mouth 2 (two) times daily. 60 tablet 6  . atorvastatin (LIPITOR) 40 MG tablet Take 1 tablet (40 mg total) by mouth every evening. 90 tablet 1  . chlorthalidone (HYGROTON) 25 MG tablet Take 1 tablet (25 mg total) by mouth daily. 90 tablet 1  . cyanocobalamin 500 MCG tablet Take 500 mcg by mouth daily.    . Multiple Vitamin (MULTIVITAMIN) tablet Take 1 tablet by mouth daily.    . nitroGLYCERIN (NITROSTAT) 0.4 MG SL tablet Place 1 tablet (0.4 mg total) under the tongue every 5 (five) minutes x 3 doses as needed for chest pain. 25 tablet 0   No current facility-administered medications for this visit.     Past Medical History:  Diagnosis Date  . Atrial fibrillation ([facility])    a. post op in 2006  b. recurrence on 08/2016 admission for NSTEMI  . CAD (coronary artery disease)    a. 2006: s/p CABG at [facility] with LIMA to LAD, SVG to PDA, SVG to OM1, SVG to D1  b. 08/2016: NSTEMI 09/18/16 which showed severe native 3V CAD with patent SVG-->PLA, LIMA-->LAD and total occlusion of SVG--> OM1 with unsuccessful attempt at PCI of SVG--> OM with inability to restore flow.   . Carotid artery disease ([facility])   . CKD (chronic kidney disease)   . Essential hypertension   . Tremor     ROS:   All systems reviewed and negative except as noted in the HPI.   Past  Surgical History:  Procedure Laterality Date  . CORONARY ARTERY BYPASS GRAFT  2006   [facility]  . CORONARY BALLOON ANGIOPLASTY N/A 09/18/2016   Procedure: Coronary Balloon Angioplasty;  Surgeon: Tonny Bollman, MD;  Location: [facility] INVASIVE [facility];  Service: Cardiovascular;  Laterality: N/A;  . LEFT HEART CATH AND CORS/GRAFTS ANGIOGRAPHY N/A 09/18/2016   Procedure: Left Heart Cath and Cors/Grafts Angiography;  Surgeon: Tonny Bollman, MD;  Location: [facility] INVASIVE CV LAB;  Service: Cardiovascular;  Laterality: N/A;     Family History  Problem Relation Age of Onset  . Heart attack Father   . Heart attack Paternal Grandfather      Social History   Socioeconomic History  . Marital status: Married    Spouse name: Not on file  . Number of children: Not on file  . Years of education: Not on file  . Highest education level: Not on file  Occupational History  . Not on file  Tobacco Use  . Smoking status: Former Smoker    Packs/day: 4.00    Years: 20.00    Pack years: 80.00    Types: Cigarettes    Quit date: 02/28/1979    Years since quitting: 40.8  . Smokeless tobacco: Former Neurosurgeon    Types: Snuff, Dorna Bloom    Quit  date: 02/27/1981  Vaping Use  . Vaping Use: Never used  Substance and Sexual Activity  . Alcohol use: No    Alcohol/week: 0.0 standard drinks  . Drug use: No  . Sexual activity: Not on file  Other Topics Concern  . Not on file  Social History Narrative  . Not on file   Social Determinants of Health   Financial Resource Strain:   . Difficulty of Paying Living Expenses: Not on file  Food Insecurity:   . Worried About Programme researcher, broadcasting/film/video in the Last Year: Not on file  . Ran Out of Food in the Last Year: Not on file  Transportation Needs:   . Lack of Transportation (Medical): Not on file  . Lack of Transportation (Non-Medical): Not on file  Physical Activity:   . Days of Exercise per Week: Not on file  . Minutes of Exercise per Session: Not on file  Stress:   . Feeling of  Stress : Not on file  Social Connections:   . Frequency of Communication with Friends and Family: Not on file  . Frequency of Social Gatherings with Friends and Family: Not on file  . Attends Religious Services: Not on file  . Active Member of Clubs or Organizations: Not on file  . Attends Banker Meetings: Not on file  . Marital Status: Not on file  Intimate Partner Violence:   . Fear of Current or Ex-Partner: Not on file  . Emotionally Abused: Not on file  . Physically Abused: Not on file  . Sexually Abused: Not on file     BP (!) 172/78   Pulse (!) 53   Ht 5\' 11"  (1.803 m)   Wt 208 lb 3.2 oz (94.4 kg)   SpO2 96%   BMI 29.04 kg/m   Physical Exam:  Well appearing 74 yo man, NAD HEENT: Unremarkable Neck:  No JVD, no thyromegally Lymphatics:  No adenopathy Back:  No CVA tenderness Lungs:  Clear with no wheezes HEART:  Regular rate rhythm, no murmurs, no rubs, no clicks Abd:  soft, positive bowel sounds, no organomegally, no rebound, no guarding Ext:  2 plus pulses, no edema, no cyanosis, no clubbing Skin:  No rashes no nodules Neuro:  CN II through XII intact, motor grossly intact  EKG - nsr with 2:1 AV block and LBBB/IVCD  Assess/Plan: 1. 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 I have discussed the indications/risks/benefits/goals/expectations of PPM insertion. He wishes to proceed. 2. Dyspnea  - he had preserved LV Function by echo 3 years ago. He is pending a 2D echo. If his EF is down, ICD will be a consideration. 3. CAD - he denies anginal symptoms. Unclear if dyspnea represents angina though I doubt it.  4. PAF - I have noted that we will be able to monitor atrial fib. He will hold eliquis prior to PM insertion.

## 2020-01-01 NOTE — H&P (View-Only) (Signed)
HPI William Hatfield is referred today by Dr. Diona Browner for consideration for PM insertion. 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 He has not had frank syncope but has significant dyspnea with exertion. He has a 2D echo pending. His heart monitor is also pending.   Allergies  Allergen Reactions  . Niaspan [Niacin] Hives and Other (See Comments)    NIASPAN ONLY/REACTION: Itching, whelps     Current Outpatient Medications  Medication Sig Dispense Refill  . apixaban (ELIQUIS) 5 MG TABS tablet Take 1 tablet (5 mg total) by mouth 2 (two) times daily. 60 tablet 6  . atorvastatin (LIPITOR) 40 MG tablet Take 1 tablet (40 mg total) by mouth every evening. 90 tablet 1  . chlorthalidone (HYGROTON) 25 MG tablet Take 1 tablet (25 mg total) by mouth daily. 90 tablet 1  . cyanocobalamin 500 MCG tablet Take 500 mcg by mouth daily.    . Multiple Vitamin (MULTIVITAMIN) tablet Take 1 tablet by mouth daily.    . nitroGLYCERIN (NITROSTAT) 0.4 MG SL tablet Place 1 tablet (0.4 mg total) under the tongue every 5 (five) minutes x 3 doses as needed for chest pain. 25 tablet 0   No current facility-administered medications for this visit.     Past Medical History:  Diagnosis Date  . Atrial fibrillation (H[facility])    a. post op in 2006  b. recurrence on 08/2016 admission for NSTEMI  . CAD (coronary artery disease)    a. 2006: s/p CABG at [facility] with LIMA to LAD, SVG to PDA, SVG to OM1, SVG to D1  b. 08/2016: NSTEMI 09/18/16 which showed severe native 3V CAD with patent SVG-->PLA, LIMA-->LAD and total occlusion of SVG--> OM1 with unsuccessful attempt at PCI of SVG--> OM with inability to restore flow.   . Carotid artery disease (H[facility])   . CKD (chronic kidney disease)   . Essential hypertension   . Tremor     ROS:   All systems reviewed and negative except as noted in the HPI.   Past  Surgical History:  Procedure Laterality Date  . CORONARY ARTERY BYPASS GRAFT  2006   [facility]  . CORONARY BALLOON ANGIOPLASTY N/A 09/18/2016   Procedure: Coronary Balloon Angioplasty;  Surgeon: Tonny Bollman, MD;  Location: [facility] INVASIVE CV LAB;  Service: Cardiovascular;  Laterality: N/A;  . LEFT HEART CATH AND CORS/GRAFTS ANGIOGRAPHY N/A 09/18/2016   Procedure: Left Heart Cath and Cors/Grafts Angiography;  Surgeon: Tonny Bollman, MD;  Location: [facility] INVASIVE CV LAB;  Service: Cardiovascular;  Laterality: N/A;     Family History  Problem Relation Age of Onset  . Heart attack Father   . Heart attack Paternal Grandfather      Social History   Socioeconomic History  . Marital status: Married    Spouse name: Not on file  . Number of children: Not on file  . Years of education: Not on file  . Highest education level: Not on file  Occupational History  . Not on file  Tobacco Use  . Smoking status: Former Smoker    Packs/day: 4.00    Years: 20.00    Pack years: 80.00    Types: Cigarettes    Quit date: 02/28/1979    Years since quitting: 40.8  . Smokeless tobacco: Former Neurosurgeon    Types: Snuff, Dorna Bloom    Quit  date: 02/27/1981  Vaping Use  . Vaping Use: Never used  Substance and Sexual Activity  . Alcohol use: No    Alcohol/week: 0.0 standard drinks  . Drug use: No  . Sexual activity: Not on file  Other Topics Concern  . Not on file  Social History Narrative  . Not on file   Social Determinants of Health   Financial Resource Strain:   . Difficulty of Paying Living Expenses: Not on file  Food Insecurity:   . Worried About Running Out of Food in the Last Year: Not on file  . Ran Out of Food in the Last Year: Not on file  Transportation Needs:   . Lack of Transportation (Medical): Not on file  . Lack of Transportation (Non-Medical): Not on file  Physical Activity:   . Days of Exercise per Week: Not on file  . Minutes of Exercise per Session: Not on file  Stress:   . Feeling of  Stress : Not on file  Social Connections:   . Frequency of Communication with Friends and Family: Not on file  . Frequency of Social Gatherings with Friends and Family: Not on file  . Attends Religious Services: Not on file  . Active Member of Clubs or Organizations: Not on file  . Attends Club or Organization Meetings: Not on file  . Marital Status: Not on file  Intimate Partner Violence:   . Fear of Current or Ex-Partner: Not on file  . Emotionally Abused: Not on file  . Physically Abused: Not on file  . Sexually Abused: Not on file     BP (!) 172/78   Pulse (!) 53   Ht 5' 11" (1.803 m)   Wt 208 lb 3.2 oz (94.4 kg)   SpO2 96%   BMI 29.04 kg/m   Physical Exam:  Well appearing 73 yo man, NAD HEENT: Unremarkable Neck:  No JVD, no thyromegally Lymphatics:  No adenopathy Back:  No CVA tenderness Lungs:  Clear with no wheezes HEART:  Regular rate rhythm, no murmurs, no rubs, no clicks Abd:  soft, positive bowel sounds, no organomegally, no rebound, no guarding Ext:  2 plus pulses, no edema, no cyanosis, no clubbing Skin:  No rashes no nodules Neuro:  CN II through XII intact, motor grossly intact  EKG - nsr with 2:1 AV block and LBBB/IVCD  Assess/Plan: 1. 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 I have discussed the indications/risks/benefits/goals/expectations of PPM insertion. He wishes to proceed. 2. Dyspnea  - he had preserved LV Function by echo 3 years ago. He is pending a 2D echo. If his EF is down, ICD will be a consideration. 3. CAD - he denies anginal symptoms. Unclear if dyspnea represents angina though I doubt it.  4. PAF - I have noted that we will be able to monitor atrial fib. He will hold eliquis prior to PM insertion. 

## 2020-01-02 ENCOUNTER — Telehealth: Payer: Self-pay

## 2020-01-02 ENCOUNTER — Telehealth: Payer: Self-pay | Admitting: *Deleted

## 2020-01-02 NOTE — Telephone Encounter (Signed)
Patient's wife informed

## 2020-01-02 NOTE — Telephone Encounter (Signed)
-----   Message from Jonelle Sidle, MD sent at 01/01/2020  1:26 PM EDT ----- Results reviewed.  LVEF normal at 60 to 65% with mild diastolic dysfunction, no major valvular abnormalities.  Will also forward results to Dr. Ladona Ridgel who saw William Hatfield today for discussion of pacemaker placement.

## 2020-01-02 NOTE — Telephone Encounter (Signed)
Preliminary Findings °Patient had a min HR of 33 bpm, max HR of 197 bpm, and avg HR of 48 bpm. °Predominant underlying rhythm was Sinus Rhythm. Bundle Branch Block/IVCD was °present. QRS morphology changes were present throughout recording. 1 run of °Ventricular Tachycardia occurred lasting 6 beats with a max rate of 116 bpm (avg °101 bpm). 8 Supraventricular Tachycardia runs occurred, the run with the fastest °interval lasting 4 beats with a max rate of 197 bpm, the longest lasting 12 beats °with an avg rate of 184 bpm. 4275 episode(s) of AV Block (2nd° Mobitz II, High °Grade and 3rd°) occurred, lasting a total of 2 days 20 hours. Isolated SVEs were rare °(<1.0%, 125), SVE Couplets were rare (<1.0%, 7), and SVE Triplets were rare °(<1.0%, 6). Isolated VEs were rare (<1.0%), and no VE Couplets or VE Triplets were °present. MD notification criteria for Complete Heart Block met - report posted prior °to notification per account request (TY).  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 He has not complained to her with any dizziness, lightheadedness or racing heart. ° °I will forward to Dr.Taylor, pacemaker insertion scheduled for 01/12/20 °

## 2020-01-06 ENCOUNTER — Telehealth: Payer: Self-pay | Admitting: *Deleted

## 2020-01-06 NOTE — Telephone Encounter (Signed)
-----   Message from Jonelle Sidle, MD sent at 01/03/2020 12:54 PM EDT ----- Results reviewed.  Cardiac monitor shows episodes of NSVT and SVT, none were sustained.  Also further documentation of intermittent second-degree type 2 and 3 heart block as already noted.  He was seen on November 4 by Dr. Ladona Ridgel for discussion of pacemaker placement.

## 2020-01-09 ENCOUNTER — Other Ambulatory Visit (HOSPITAL_COMMUNITY)
Admission: RE | Admit: 2020-01-09 | Discharge: 2020-01-09 | Disposition: A | Discharge status: Home / Self Care | Payer: Medicare Other | Source: Ambulatory Visit | Attending: Internal Medicine | Admitting: Internal Medicine

## 2020-01-09 DIAGNOSIS — Z20822 Contact with and (suspected) exposure to covid-19: Secondary | ICD-10-CM | POA: Insufficient documentation

## 2020-01-09 DIAGNOSIS — Z01812 Encounter for preprocedural laboratory examination: Secondary | ICD-10-CM | POA: Diagnosis present

## 2020-01-09 LAB — SARS CORONAVIRUS 2 (TAT 6-24 HRS): SARS Coronavirus 2: NEGATIVE

## 2020-01-09 NOTE — Telephone Encounter (Signed)
Patient informed. 

## 2020-01-12 ENCOUNTER — Ambulatory Visit (HOSPITAL_COMMUNITY)
Admission: RE | Disposition: A | Discharge status: Home / Self Care | Payer: Self-pay | Source: Home / Self Care | Attending: Internal Medicine

## 2020-01-12 ENCOUNTER — Other Ambulatory Visit: Payer: Self-pay

## 2020-01-12 ENCOUNTER — Ambulatory Visit (HOSPITAL_COMMUNITY): Payer: Medicare Other

## 2020-01-12 ENCOUNTER — Ambulatory Visit (HOSPITAL_COMMUNITY)
Admission: RE | Admit: 2020-01-12 | Discharge: 2020-01-12 | Disposition: A | Discharge status: Home / Self Care | Payer: Medicare Other | Attending: Internal Medicine | Admitting: Internal Medicine

## 2020-01-12 DIAGNOSIS — I442 Atrioventricular block, complete: Secondary | ICD-10-CM

## 2020-01-12 DIAGNOSIS — Z87891 Personal history of nicotine dependence: Secondary | ICD-10-CM | POA: Insufficient documentation

## 2020-01-12 DIAGNOSIS — Z7901 Long term (current) use of anticoagulants: Secondary | ICD-10-CM | POA: Diagnosis not present

## 2020-01-12 DIAGNOSIS — I480 Paroxysmal atrial fibrillation: Secondary | ICD-10-CM | POA: Insufficient documentation

## 2020-01-12 DIAGNOSIS — Z79899 Other long term (current) drug therapy: Secondary | ICD-10-CM | POA: Insufficient documentation

## 2020-01-12 DIAGNOSIS — Z950 Presence of cardiac pacemaker: Secondary | ICD-10-CM

## 2020-01-12 DIAGNOSIS — R0609 Other forms of dyspnea: Secondary | ICD-10-CM | POA: Diagnosis not present

## 2020-01-12 DIAGNOSIS — I2510 Atherosclerotic heart disease of native coronary artery without angina pectoris: Secondary | ICD-10-CM | POA: Diagnosis not present

## 2020-01-12 HISTORY — PX: PACEMAKER IMPLANT: EP1218

## 2020-01-12 LAB — BASIC METABOLIC PANEL
Anion gap: 9 (ref 5–15)
BUN: 16 mg/dL (ref 8–23)
CO2: 27 mmol/L (ref 22–32)
Calcium: 9.4 mg/dL (ref 8.9–10.3)
Chloride: 104 mmol/L (ref 98–111)
Creatinine, Ser: 1.23 mg/dL (ref 0.61–1.24)
GFR, Estimated: >60 mL/min (ref >60)
Glucose, Bld: 112 mg/dL — ABNORMAL HIGH (ref 70–99)
Potassium: 3.4 mmol/L — ABNORMAL LOW (ref 3.5–5.1)
Sodium: 140 mmol/L (ref 135–145)

## 2020-01-12 SURGERY — PACEMAKER IMPLANT

## 2020-01-12 MED ORDER — LIDOCAINE HCL 1 % IJ SOLN
INTRAMUSCULAR | Status: AC
  Filled 2020-01-12: qty 20

## 2020-01-12 MED ORDER — SODIUM CHLORIDE 0.9 % IV SOLN
80.0000 mg | INTRAVENOUS | Status: AC
  Administered 2020-01-12: 80 mg

## 2020-01-12 MED ORDER — SODIUM CHLORIDE 0.9 % IV SOLN: INTRAVENOUS | Status: DC

## 2020-01-12 MED ORDER — HEPARIN (PORCINE) IN NACL 1000-0.9 UT/500ML-% IV SOLN
INTRAVENOUS | Status: DC | PRN
  Administered 2020-01-12: 500 mL

## 2020-01-12 MED ORDER — CEFAZOLIN SODIUM-DEXTROSE 2-4 GM/100ML-% IV SOLN
2.0000 g | INTRAVENOUS | Status: AC
  Administered 2020-01-12: 2 g via INTRAVENOUS

## 2020-01-12 MED ORDER — MIDAZOLAM HCL 5 MG/5ML IJ SOLN
INTRAMUSCULAR | Status: DC | PRN
  Administered 2020-01-12 (×3): 1 mg via INTRAVENOUS

## 2020-01-12 MED ORDER — ACETAMINOPHEN 325 MG PO TABS: 325.0000 mg | ORAL_TABLET | ORAL | Status: DC | PRN

## 2020-01-12 MED ORDER — METOPROLOL TARTRATE 5 MG/5ML IV SOLN
INTRAVENOUS | Status: AC
  Filled 2020-01-12: qty 5

## 2020-01-12 MED ORDER — LIDOCAINE HCL (PF) 1 % IJ SOLN
INTRAMUSCULAR | Status: AC
  Filled 2020-01-12: qty 30

## 2020-01-12 MED ORDER — CEFAZOLIN SODIUM-DEXTROSE 2-4 GM/100ML-% IV SOLN
INTRAVENOUS | Status: AC
  Filled 2020-01-12: qty 100

## 2020-01-12 MED ORDER — CEFAZOLIN SODIUM-DEXTROSE 1-4 GM/50ML-% IV SOLN: 1.0000 g | Freq: Once | INTRAVENOUS | Status: DC

## 2020-01-12 MED ORDER — HEPARIN (PORCINE) IN NACL 1000-0.9 UT/500ML-% IV SOLN
INTRAVENOUS | Status: AC
  Filled 2020-01-12: qty 500

## 2020-01-12 MED ORDER — FENTANYL CITRATE (PF) 100 MCG/2ML IJ SOLN
INTRAMUSCULAR | Status: AC
  Filled 2020-01-12: qty 2

## 2020-01-12 MED ORDER — MIDAZOLAM HCL 5 MG/5ML IJ SOLN
INTRAMUSCULAR | Status: AC
  Filled 2020-01-12: qty 5

## 2020-01-12 MED ORDER — LIDOCAINE HCL (PF) 1 % IJ SOLN
INTRAMUSCULAR | Status: DC | PRN
  Administered 2020-01-12: 50 mL

## 2020-01-12 MED ORDER — SODIUM CHLORIDE 0.9 % IV SOLN
INTRAVENOUS | Status: AC
  Filled 2020-01-12: qty 2

## 2020-01-12 MED ORDER — CHLORHEXIDINE GLUCONATE 4 % EX LIQD: 4.0000 "application " | Freq: Once | CUTANEOUS | Status: DC

## 2020-01-12 MED ORDER — ONDANSETRON HCL 4 MG/2ML IJ SOLN: 4.0000 mg | Freq: Four times a day (QID) | INTRAMUSCULAR | Status: DC | PRN

## 2020-01-12 MED ORDER — METOPROLOL TARTRATE 5 MG/5ML IV SOLN
INTRAVENOUS | Status: DC | PRN
  Administered 2020-01-12: 5 mg via INTRAVENOUS

## 2020-01-12 MED ORDER — FENTANYL CITRATE (PF) 100 MCG/2ML IJ SOLN
INTRAMUSCULAR | Status: DC | PRN
  Administered 2020-01-12 (×3): 12.5 ug via INTRAVENOUS

## 2020-01-12 SURGICAL SUPPLY — 13 items
CABLE SURGICAL S-101-97-12 (CABLE) ×6 IMPLANT
CATH RIGHTSITE C315HIS02 (CATHETERS) ×6 IMPLANT
CATH SELECT PACE 669183 (CATHETERS) ×3 IMPLANT
CUTTER LV DELIVERY CATHETER 7 (MISCELLANEOUS) ×3 IMPLANT
IPG PACE AZUR XT DR MRI W1DR01 (Pacemaker) ×1 IMPLANT
LEAD CAPSURE NOVUS 5076-52CM (Lead) ×3 IMPLANT
LEAD INGEVITY 7842 59 (Lead) ×3 IMPLANT
PACE AZURE XT DR MRI W1DR01 (Pacemaker) ×3 IMPLANT
PAD PRO RADIOLUCENT 2001M-C (PAD) ×3 IMPLANT
SHEATH 7FR PRELUDE SNAP 13 (SHEATH) ×6 IMPLANT
SLITTER 6232ADJ (MISCELLANEOUS) ×3 IMPLANT
TRAY PACEMAKER INSERTION (PACKS) ×3 IMPLANT
WIRE HI TORQ VERSACORE-J 145CM (WIRE) ×3 IMPLANT

## 2020-01-12 NOTE — Interval H&P Note (Signed)
History and Physical Interval Note:  01/12/2020 11:08 AM  William Hatfield  has presented today for surgery, with the diagnosis of 2 to 1 heart block.  The various methods of treatment have been discussed with the patient and family. After consideration of risks, benefits and other options for treatment, the patient has consented to  Procedure(s): PACEMAKER IMPLANT (N/A) as a surgical intervention.  The patient's history has been reviewed, patient examined, no change in status, stable for surgery.  I have reviewed the patient's chart and labs.  Questions were answered to the patient's satisfaction.     Lewayne Bunting

## 2020-01-12 NOTE — Discharge Instructions (Signed)
After Your Pacemaker    You have a Medtronic Pacemaker  ACTIVITY  Do not lift your arm above shoulder height for 1 week after your procedure. After 7 days, you may progress as below.   You should remove your sling 24 hours after your procedure, unless otherwise instructed by your provider.     Monday January 19, 2020  Tuesday January 20, 2020 Wednesday January 21, 2020 Thursday January 22, 2020    Do not lift, push, pull, or carry anything over 10 pounds with the affected arm until 6 weeks (Monday February 23, 2020 ) after your procedure.    Do NOT DRIVE until you have been seen for your wound check, or as long as instructed by your healthcare provider.    Ask your healthcare provider when you can go back to work   INCISION/Dressing  If you are on a blood thinner such as Coumadin, Xarelto, Eliquis, Plavix, or Pradaxa please confirm with your provider when this should be resumed. 01/16/2020   If large square, outer bandage is left in place, this can be removed after 24 hours from your procedure. Do not remove steri-strips or glue as below.    Monitor your Pacemaker site for redness, swelling, and drainage. Call the device clinic at [phone] if you experience these symptoms or fever/chills.   If your incision is sealed with Steri-strips or staples, you may shower 10 days after your procedure or when told by your provider. Do not remove the steri-strips or let the shower hit directly on your site. You may wash around your site with soap and water.     Avoid lotions, ointments, or perfumes over your incision until it is well-healed.   You may use a hot tub or a pool AFTER your wound check appointment if the incision is completely closed.   PAcemaker Alerts:  Some alerts are vibratory and others beep. These are NOT emergencies. Please call our office to let us know. If this occurs at night or on weekends, it can wait until the next business day. Send a remote  transmission.   If your device is capable of reading fluid status (for heart failure), you will be offered monthly monitoring to review this with you.   DEVICE MANAGEMENT  Remote monitoring is used to monitor your pacemaker from home. This monitoring is scheduled every 91 days by our office. It allows [place] to keep an eye on the functioning of your device to ensure it is working properly. You will routinely see your Electrophysiologist annually (more often if necessary).    You should receive your ID card for your new device in 4-8 weeks. Keep this card with you at all times once received. Consider wearing a medical alert bracelet or necklace.   Your Pacemaker may be MRI compatible. This will be discussed at your next office visit/wound check.  You should avoid contact with strong electric or magnetic fields.    Do not use amateur (ham) radio equipment or electric (arc) welding torches. MP3 player headphones with magnets should not be used. Some devices are safe to use if held at least 12 inches (30 cm) from your Pacemaker. These include power tools, lawn mowers, and speakers. If you are unsure if something is safe to use, ask your health care provider.   When using your cell phone, hold it to the ear that is on the opposite side from the Pacemaker. Do not leave your cell phone in a pocket over the Pacemaker.  You may safely use electric blankets, heating pads, computers, and microwave ovens.  Call the office right away if:  You have chest pain.  You feel more short of breath than you have felt before.  You feel more light-headed than you have felt before.  Your incision starts to open up.  This information is not intended to replace advice given to you by your health care provider. Make sure you discuss any questions you have with your health care provider.

## 2020-01-12 NOTE — Progress Notes (Signed)
Patient and wife was given discharge instructions. Both verbalized understanding. 

## 2020-01-13 ENCOUNTER — Telehealth: Payer: Self-pay

## 2020-01-13 ENCOUNTER — Encounter (HOSPITAL_COMMUNITY): Payer: Self-pay | Admitting: Internal Medicine

## 2020-01-13 MED FILL — Lidocaine HCl Local Inj 1%: INTRAMUSCULAR | Qty: 60 | Status: AC

## 2020-01-13 NOTE — Telephone Encounter (Signed)
Follow-up after same day discharge: Implant date: 01/12/2020 MD: Lewayne Bunting, MD Device: Medtronic [id number] Azure XT DR MRI  Location: Left chest   Wound check visit: 01/27/20 @ 1:00 PM 90 day MD follow-up: 04/27/2020 @ 1:00 PM  Remote Transmission received:01/13/2020. Lead values present appear stable.   Dressing removed: Will remove this evening at 6:00 PM.   Patient advised to call device clinic with any questions or concerns. Verbalized understanding.

## 2020-01-14 ENCOUNTER — Telehealth: Payer: Self-pay | Admitting: Internal Medicine

## 2020-01-14 NOTE — Telephone Encounter (Signed)
No issues with taking allopurinol prior to pacemaker placement.

## 2020-01-14 NOTE — Telephone Encounter (Signed)
Patient had pacemaker placed on Monday and wants to know if he could take the Allopurinol 100mg  for gout.

## 2020-01-14 NOTE — Telephone Encounter (Signed)
Pt notified and voiced understanding 

## 2020-01-27 ENCOUNTER — Ambulatory Visit (INDEPENDENT_AMBULATORY_CARE_PROVIDER_SITE_OTHER): Payer: Medicare Other | Admitting: Student

## 2020-01-27 ENCOUNTER — Other Ambulatory Visit: Payer: Self-pay

## 2020-01-27 DIAGNOSIS — Z950 Presence of cardiac pacemaker: Secondary | ICD-10-CM

## 2020-01-27 DIAGNOSIS — I480 Paroxysmal atrial fibrillation: Secondary | ICD-10-CM

## 2020-01-27 DIAGNOSIS — I441 Atrioventricular block, second degree: Secondary | ICD-10-CM

## 2020-01-27 LAB — CUP PACEART INCLINIC DEVICE CHECK
Battery Remaining Longevity: 143 mo
Battery Voltage: 3.22 V
Brady Statistic AP VP Percent: 3.17 %
Brady Statistic AP VS Percent: 0.01 %
Brady Statistic AS VP Percent: 93.79 %
Brady Statistic AS VS Percent: 3.03 %
Brady Statistic RA Percent Paced: 3.52 %
Brady Statistic RV Percent Paced: 96.94 %
Date Time Interrogation Session: 20211130130535
Implantable Lead Implant Date: 20211115
Implantable Lead Implant Date: 20211115
Implantable Lead Location: 753859
Implantable Lead Location: 753860
Implantable Lead Model: 5076
Implantable Lead Model: 7842
Implantable Lead Serial Number: 1057567
Implantable Pulse Generator Implant Date: 20211115
Lead Channel Impedance Value: 304 Ohm
Lead Channel Impedance Value: 513 Ohm
Lead Channel Impedance Value: 513 Ohm
Lead Channel Impedance Value: 722 Ohm
Lead Channel Pacing Threshold Amplitude: 0.75 V
Lead Channel Pacing Threshold Amplitude: 0.75 V
Lead Channel Pacing Threshold Pulse Width: 0.4 ms
Lead Channel Pacing Threshold Pulse Width: 0.4 ms
Lead Channel Sensing Intrinsic Amplitude: 25.25 mV
Lead Channel Sensing Intrinsic Amplitude: 25.25 mV
Lead Channel Sensing Intrinsic Amplitude: 3.125 mV
Lead Channel Sensing Intrinsic Amplitude: 3.5 mV
Lead Channel Setting Pacing Amplitude: 3.5 V
Lead Channel Setting Pacing Amplitude: 3.5 V
Lead Channel Setting Pacing Pulse Width: 0.4 ms
Lead Channel Setting Sensing Sensitivity: 1.2 mV

## 2020-01-27 MED ORDER — METOPROLOL TARTRATE 25 MG PO TABS: 25.0000 mg | ORAL_TABLET | Freq: Two times a day (BID) | ORAL | 3 refills | Status: DC

## 2020-01-27 NOTE — Patient Instructions (Signed)
Medication Instructions:  Your physician has recommended you make the following change in your medication:  -- RESUME Metoprolol Tartrate (Lopressor) 25 mg - Take 1 tablet (25 mg) by mouth twice daily -- NEW RX SENT *If you need a refill on your cardiac medications before your next appointment, please call your pharmacy*  Follow-Up: At [facility], you and your health needs are our priority.  As part of our continuing mission to provide you with exceptional heart care, we have created designated Provider Care Teams.  These Care Teams include your primary Cardiologist (physician) and Advanced Practice Providers (APPs -  Physician Assistants and Nurse Practitioners) who all work together to provide you with the care you need, when you need it.  We recommend signing up for the patient portal called "MyChart".  Sign up information is provided on this After Visit Summary.  MyChart is used to connect with patients for Virtual Visits (Telemedicine).  Patients are able to view lab/test results, encounter notes, upcoming appointments, etc.  Non-urgent messages can be sent to your provider as well.   To learn more about what you can do with MyChart, go to [email or web address].    Your next appointment:   Your physician recommends that you keep your scheduled follow-up appointment on 04/27/20 with Dr. Ladona Ridgel in [place]   Remote monitoring is used to monitor your Pacemaker from home. This monitoring reduces the number of office visits required to check your device to one time per year. It allows [place] to keep an eye on the functioning of your device to ensure it is working properly. You are scheduled for a device check from home on 04/12/20. You may send your transmission at any time that day. If you have a wireless device, the transmission will be sent automatically. After your physician reviews your transmission, you will receive a postcard with your next transmission date.  The format for your next  appointment:   In Person with Lewayne Bunting, MD

## 2020-01-27 NOTE — Progress Notes (Signed)
Wound check appointment. Steri-strips removed. Wound without redness or edema. Incision edges approximated, wound well healed. Normal device function. Thresholds, sensing, and impedances consistent with implant measurements. Device programmed at 3.5V/auto capture programmed on for extra safety margin until 3 month visit. HRs right shifted. BB resumed. 1 short AF noted. Known on OAC. No VHR. Patient educated about wound care, arm mobility, lifting restrictions. ROV in 3 months with implanting physician.  As above. Site stable.   Sinus tach in 100-110 range on presentation. Will resume Lopressor 25 mg BID now s/p pacer.   Keep follow up as scheduled.   Casimiro Needle [address]" [place], PA-C  01/27/2020 1:08 PM

## 2020-02-20 ENCOUNTER — Other Ambulatory Visit: Payer: Self-pay

## 2020-02-20 ENCOUNTER — Emergency Department (HOSPITAL_COMMUNITY): Payer: Medicare Other

## 2020-02-20 ENCOUNTER — Telehealth: Payer: Self-pay | Admitting: Cardiology

## 2020-02-20 ENCOUNTER — Encounter (HOSPITAL_COMMUNITY): Payer: Self-pay | Admitting: Emergency Medicine

## 2020-02-20 ENCOUNTER — Emergency Department (HOSPITAL_COMMUNITY)
Admission: EM | Admit: 2020-02-20 | Discharge: 2020-02-20 | Disposition: A | Discharge status: Home / Self Care | Payer: Medicare Other | Attending: Emergency Medicine | Admitting: Emergency Medicine

## 2020-02-20 DIAGNOSIS — U071 COVID-19: Secondary | ICD-10-CM | POA: Diagnosis not present

## 2020-02-20 DIAGNOSIS — Z951 Presence of aortocoronary bypass graft: Secondary | ICD-10-CM | POA: Insufficient documentation

## 2020-02-20 DIAGNOSIS — I129 Hypertensive chronic kidney disease with stage 1 through stage 4 chronic kidney disease, or unspecified chronic kidney disease: Secondary | ICD-10-CM | POA: Diagnosis not present

## 2020-02-20 DIAGNOSIS — N189 Chronic kidney disease, unspecified: Secondary | ICD-10-CM | POA: Insufficient documentation

## 2020-02-20 DIAGNOSIS — Z79899 Other long term (current) drug therapy: Secondary | ICD-10-CM | POA: Diagnosis not present

## 2020-02-20 DIAGNOSIS — Z7901 Long term (current) use of anticoagulants: Secondary | ICD-10-CM | POA: Diagnosis not present

## 2020-02-20 DIAGNOSIS — Z950 Presence of cardiac pacemaker: Secondary | ICD-10-CM | POA: Diagnosis not present

## 2020-02-20 DIAGNOSIS — I2510 Atherosclerotic heart disease of native coronary artery without angina pectoris: Secondary | ICD-10-CM | POA: Diagnosis not present

## 2020-02-20 DIAGNOSIS — Z87891 Personal history of nicotine dependence: Secondary | ICD-10-CM | POA: Insufficient documentation

## 2020-02-20 DIAGNOSIS — R0602 Shortness of breath: Secondary | ICD-10-CM | POA: Insufficient documentation

## 2020-02-20 DIAGNOSIS — R059 Cough, unspecified: Secondary | ICD-10-CM | POA: Diagnosis present

## 2020-02-20 LAB — CBC WITH DIFFERENTIAL/PLATELET
Abs Immature Granulocytes: 0.05 10*3/uL (ref 0.00–0.07)
Basophils Absolute: 0 10*3/uL (ref 0.0–0.1)
Basophils Relative: 1 %
Eosinophils Absolute: 0 10*3/uL (ref 0.0–0.5)
Eosinophils Relative: 0 %
HCT: 46.3 % (ref 39.0–52.0)
Hemoglobin: 15.5 g/dL (ref 13.0–17.0)
Immature Granulocytes: 1 %
Lymphocytes Relative: 20 %
Lymphs Abs: 1.6 10*3/uL (ref 0.7–4.0)
MCH: 28.9 pg (ref 26.0–34.0)
MCHC: 33.5 g/dL (ref 30.0–36.0)
MCV: 86.4 fL (ref 80.0–100.0)
Monocytes Absolute: 0.8 10*3/uL (ref 0.1–1.0)
Monocytes Relative: 10 %
Neutro Abs: 5.4 10*3/uL (ref 1.7–7.7)
Neutrophils Relative %: 68 %
Platelets: 307 10*3/uL (ref 150–400)
RBC: 5.36 MIL/uL (ref 4.22–5.81)
RDW: 14.3 % (ref 11.5–15.5)
WBC: 7.9 10*3/uL (ref 4.0–10.5)
nRBC: 0 % (ref 0.0–0.2)

## 2020-02-20 LAB — COMPREHENSIVE METABOLIC PANEL
ALT: 43 U/L (ref 0–44)
AST: 37 U/L (ref 15–41)
Albumin: 3.6 g/dL (ref 3.5–5.0)
Alkaline Phosphatase: 55 U/L (ref 38–126)
Anion gap: 13 (ref 5–15)
BUN: 40 mg/dL — ABNORMAL HIGH (ref 8–23)
CO2: 27 mmol/L (ref 22–32)
Calcium: 9.1 mg/dL (ref 8.9–10.3)
Chloride: 98 mmol/L (ref 98–111)
Creatinine, Ser: 1.27 mg/dL — ABNORMAL HIGH (ref 0.61–1.24)
GFR, Estimated: 60 mL/min — ABNORMAL LOW (ref >60)
Glucose, Bld: 120 mg/dL — ABNORMAL HIGH (ref 70–99)
Potassium: 3.3 mmol/L — ABNORMAL LOW (ref 3.5–5.1)
Sodium: 138 mmol/L (ref 135–145)
Total Bilirubin: 0.9 mg/dL (ref 0.3–1.2)
Total Protein: 8 g/dL (ref 6.5–8.1)

## 2020-02-20 LAB — RESP PANEL BY RT-PCR (FLU A&B, COVID) ARPGX2
Influenza A by PCR: NEGATIVE
Influenza B by PCR: NEGATIVE
SARS Coronavirus 2 by RT PCR: POSITIVE — AB

## 2020-02-20 LAB — BRAIN NATRIURETIC PEPTIDE: B Natriuretic Peptide: 95 pg/mL (ref 0.0–100.0)

## 2020-02-20 NOTE — ED Provider Notes (Signed)
[facility] EMERGENCY DEPARTMENT Provider Note   CSN: [id number] Arrival date & time: 02/20/20  1417     History No chief complaint on file.   William Hatfield is a 74 y.o. male with known COVID-19 infection diagnosed on 02/18/2020 in [place].  Patient states that he has mild dry cough, feeling weak and tired, some congestion, and headache.  Patient states that his wife is currently admitted in the ICU with COVID-19 infection.  Patient was not vaccinated as COVID-19, however is planning to become vaccinated as soon as he is eligible again after recovery.  Denies chest pain, endorses DOE, denies palpitations.  Patient recently had pacemaker implanted in November 2021. Denies abdominal pain, nausea, vomiting, diarrhea.  Denies dysuria. Denies blurred vision, double vision, dizziness, lightheadedness, syncope.  Endorses anorexia due to loss of taste. States he presented today at the prompting of his daughter who is a Engineer, civil (consulting), due to concern for mild swelling of his ankles that is new for him.  I personally reviewed the patient medical records.  History of hypertension, coronary artery disease with CABG, dyslipidemia, paroxysmal A. fib, NSTEMI, CKD, pacemaker.   HPI     Past Medical History:  Diagnosis Date  . Atrial fibrillation (HCC)    a. post op in 2006  b. recurrence on 08/2016 admission for NSTEMI  . CAD (coronary artery disease)    a. 2006: s/p CABG at [facility] with LIMA to LAD, SVG to PDA, SVG to OM1, SVG to D1  b. 08/2016: NSTEMI 09/18/16 which showed severe native 3V CAD with patent SVG-->PLA, LIMA-->LAD and total occlusion of SVG--> OM1 with unsuccessful attempt at PCI of SVG--> OM with inability to restore flow.   . Carotid artery disease (HCC)   . CKD (chronic kidney disease)   . Essential hypertension   . Tremor     Patient Active Problem List   Diagnosis Date Noted  . PAF (paroxysmal atrial fibrillation) (HCC) 09/20/2016  . Non-ST elevation (NSTEMI) myocardial  infarction (HCC) 09/20/2016  . CKD (chronic kidney disease)   . Dyslipidemia 08/01/2013  . Carotid artery disease (HCC)   . Tremor   . Hypertension   . CAD (coronary artery disease)   . Hx of CABG     Past Surgical History:  Procedure Laterality Date  . CORONARY ARTERY BYPASS GRAFT  2006   [facility]  . CORONARY BALLOON ANGIOPLASTY N/A 09/18/2016   Procedure: Coronary Balloon Angioplasty;  Surgeon: Tonny Bollman, MD;  Location: [facility] INVASIVE CV LAB;  Service: Cardiovascular;  Laterality: N/A;  . LEFT HEART CATH AND CORS/GRAFTS ANGIOGRAPHY N/A 09/18/2016   Procedure: Left Heart Cath and Cors/Grafts Angiography;  Surgeon: Tonny Bollman, MD;  Location: [facility] INVASIVE CV LAB;  Service: Cardiovascular;  Laterality: N/A;  . PACEMAKER IMPLANT N/A 01/12/2020   Procedure: PACEMAKER IMPLANT;  Surgeon: Marinus Maw, MD;  Location: [facility] INVASIVE CV LAB;  Service: Cardiovascular;  Laterality: N/A;       Family History  Problem Relation Age of Onset  . Heart attack Father   . Heart attack Paternal Grandfather     Social History   Tobacco Use  . Smoking status: Former Smoker    Packs/day: 4.00    Years: 20.00    Pack years: 80.00    Types: Cigarettes    Quit date: 02/28/1979    Years since quitting: 41.0  . Smokeless tobacco: Former Neurosurgeon    Types: Snuff, Dorna Bloom    Quit date: 02/27/1981  Vaping Use  . Vaping Use:  Never used  Substance Use Topics  . Alcohol use: No    Alcohol/week: 0.0 standard drinks  . Drug use: No    Home Medications Prior to Admission medications   Medication Sig Start Date End Date Taking? Authorizing Provider  acetaminophen (TYLENOL) 500 MG tablet Take 1,000 mg by mouth every 6 (six) hours as needed for moderate pain or headache.   Yes [provider]  allopurinol (ZYLOPRIM) 100 MG tablet Take 100 mg by mouth daily. 01/14/20  Yes [provider]  apixaban (ELIQUIS) 5 MG TABS tablet Take 1 tablet (5 mg total) by mouth 2 (two) times daily. 09/15/19  Yes  Netta Neat., NP  atorvastatin (LIPITOR) 40 MG tablet Take 1 tablet (40 mg total) by mouth every evening. 10/03/19  Yes Netta Neat., NP  azithromycin (ZITHROMAX) 250 MG tablet Take 250 mg by mouth as directed. 02/18/20  Yes [provider]  chlorthalidone (HYGROTON) 25 MG tablet Take 1 tablet (25 mg total) by mouth daily. Patient taking differently: Take 12.5 mg by mouth 2 (two) times daily. 10/03/19  Yes Netta Neat., NP  Coenzyme Q10 (COQ-10) 100 MG CAPS Take 100 mg by mouth daily.   Yes [provider]  cyanocobalamin 500 MCG tablet Take 500 mcg by mouth daily.   Yes [provider]  fluticasone (FLONASE) 50 MCG/ACT nasal spray Place 1 spray into both nostrils daily as needed for allergies or rhinitis.   Yes [provider]  metoprolol tartrate (LOPRESSOR) 25 MG tablet Take 1 tablet (25 mg total) by mouth 2 (two) times daily. 01/27/20 04/26/20 Yes TilleryMariam Dollar, PA-C  Multiple Vitamin (MULTIVITAMIN) tablet Take 1 tablet by mouth daily.   Yes [provider]  nitroGLYCERIN (NITROSTAT) 0.4 MG SL tablet Place 1 tablet (0.4 mg total) under the tongue every 5 (five) minutes x 3 doses as needed for chest pain. Patient not taking: No sig reported 07/09/17   Laqueta Linden, MD    Allergies    Niaspan [niacin]  Review of Systems   Review of Systems  Constitutional: Positive for activity change, appetite change and fatigue. Negative for chills, diaphoresis and fever.  HENT: Positive for congestion, rhinorrhea and sinus pain. Negative for sore throat and trouble swallowing.   Eyes: Negative.   Respiratory: Positive for cough and shortness of breath. Negative for chest tightness and wheezing.   Cardiovascular: Negative for chest pain, palpitations and leg swelling.  Gastrointestinal: Negative for abdominal pain, diarrhea, nausea and vomiting.  Genitourinary: Negative for dysuria.  Musculoskeletal: Negative.   Skin:  Negative.   Neurological: Positive for headaches. Negative for dizziness, syncope, speech difficulty, weakness and light-headedness.    Physical Exam Updated Vital Signs BP 137/74   Pulse 83   Temp 98 F (36.7 C) (Oral)   Resp (!) 25   Ht 5\' 11"  (1.803 m)   Wt 96.2 kg   SpO2 97%   BMI 29.57 kg/m   Physical Exam Vitals and nursing note reviewed.  Constitutional:      General: He is not in acute distress. HENT:     Head: Normocephalic and atraumatic.     Right Ear: External ear normal.     Left Ear: External ear normal.     Nose: Nose normal.     Mouth/Throat:     Mouth: Mucous membranes are moist.     Pharynx: Oropharynx is clear. Uvula midline. No oropharyngeal exudate or posterior oropharyngeal erythema.  Tonsils: No tonsillar exudate.  Eyes:     General:        Right eye: No discharge.        Left eye: No discharge.     Extraocular Movements: Extraocular movements intact.     Conjunctiva/sclera: Conjunctivae normal.     Pupils: Pupils are equal, round, and reactive to light.  Neck:     Trachea: Trachea and phonation normal.  Cardiovascular:     Rate and Rhythm: Normal rate and regular rhythm.     Pulses:          Radial pulses are 2+ on the right side and 2+ on the left side.       Dorsalis pedis pulses are 1+ on the right side and 1+ on the left side.     Heart sounds: No murmur heard.   Pulmonary:     Effort: Pulmonary effort is normal. No respiratory distress.     Breath sounds: Examination of the left-lower field reveals rales. Rales present. No wheezing.  Chest:     Chest wall: No deformity, swelling, tenderness, crepitus or edema.  Abdominal:     General: Bowel sounds are normal. There is no distension.     Palpations: Abdomen is soft.     Tenderness: There is no abdominal tenderness.  Musculoskeletal:        General: No deformity.     Cervical back: Neck supple. No rigidity, tenderness or crepitus. No pain with movement, spinous process tenderness  or muscular tenderness.     Right lower leg: 1+ Edema present.     Left lower leg: 1+ Edema present.  Lymphadenopathy:     Cervical: No cervical adenopathy.  Skin:    General: Skin is warm and dry.     Capillary Refill: Capillary refill takes less than 2 seconds.  Neurological:     General: No focal deficit present.     Mental Status: He is alert and oriented to person, place, and time. Mental status is at baseline.     Sensory: Sensation is intact.     Motor: Motor function is intact.     Gait: Gait is intact.  Psychiatric:        Mood and Affect: Mood normal.     ED Results / Procedures / Treatments   Labs (all labs ordered are listed, but only abnormal results are displayed) Labs Reviewed  RESP PANEL BY RT-PCR (FLU A&B, COVID) ARPGX2 - Abnormal; Notable for the following components:      Result Value   SARS Coronavirus 2 by RT PCR POSITIVE (*)    All other components within normal limits  COMPREHENSIVE METABOLIC PANEL - Abnormal; Notable for the following components:   Potassium 3.3 (*)    Glucose, Bld 120 (*)    BUN 40 (*)    Creatinine, Ser 1.27 (*)    GFR, Estimated 60 (*)    All other components within normal limits  CBC WITH DIFFERENTIAL/PLATELET  BRAIN NATRIURETIC PEPTIDE    EKG EKG Interpretation  Date/Time:  Friday February 20 2020 17:43:36 EST Ventricular Rate:  85 PR Interval:    QRS Duration: 133 QT Interval:  416 QTC Calculation: 495 R Axis:   44 Text Interpretation: Sinus rhythm Probable left atrial enlargement Right bundle branch block Inferior infarct, age indeterminate Abnormal lateral Q waves Confirmed by Geoffery Lyons ([id number]) on 02/20/2020 6:24:27 PM   Radiology DG Chest Portable 1 View  Result Date: 02/20/2020 CLINICAL DATA:  74 year old male with  shortness of breath. Positive diagnosis of recent COVID-19. EXAM: PORTABLE CHEST 1 VIEW COMPARISON:  Chest radiograph dated 01/12/2020. FINDINGS: Mild diffuse interstitial prominence may represent  mild edema. There is mild haziness of the left lung which may represent atypical pneumonia. Clinical correlation is recommended. No focal consolidation, pleural effusion or pneumothorax. The cardiac silhouette is within limits. Atherosclerotic calcification of the aorta. 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 No acute osseous pathology. IMPRESSION: 1. Mild interstitial prominence may represent mild edema. 2. Mild haziness of the left lung may represent atypical pneumonia. Electronically Signed   By: Elgie Collard M.D.   On: 02/20/2020 17:57    Procedures Procedures (including critical care time)  Medications Ordered in ED Medications - No data to display  ED Course  I have reviewed the triage vital signs and the nursing notes.  Pertinent labs & imaging results that were available during my care of the patient were reviewed by me and considered in my medical decision making (see chart for details).    MDM Rules/Calculators/A&P                         74 year old male presents with known COVID-19 infection diagnosed on 02/18/2020 with concern for mild lower extremity edema.   Vital signs are normal on intake.  Patient is normotensive, with oxygen saturation 95% on room air.  He is afebrile.  Physical exam significant for mild rales in the left lung base.  1+ pitting edema bilaterally in the lower extremities.  Cap refill normal, normal radial and symmetric DP pulses bilaterally.  CBC unremarkable, CMP with mild hypokalemia 3.3. BUN/creatinine at patient's baseline.  Respiratory pathogen panel positive for COVID-19, negative for influenza A/B.  BNP normal, 95.  Chest x-ray with mild interstitial prominence suggesting mild edema, haziness of left lung may represent atypical pneumonia.  EKG Sinus rhythm, no STEMI.  Ambulatory oxygen saturation remained > 95%.  Given reassuring physical exam, vital signs, laboratory studies, EKG, and chest x-ray, no further  work-up is warranted in the emergency department at this time.  William Hatfield voiced understanding of his medical evaluation treatment plan.  Each of his questions were answered to his expressed satisfaction.  He may follow-up with his primary care doctor.  Strict precautions were given.  Patient is well-appearing, stable, and appropriate for discharge at this time.  William Hatfield was evaluated in Emergency Department on 02/20/2020 for the symptoms described in the history of present illness. He was evaluated in the context of the global COVID-19 pandemic, which necessitated consideration that the patient might be at risk for infection with the SARS-CoV-2 virus that causes COVID-19. Institutional protocols and algorithms that pertain to the evaluation of patients at risk for COVID-19 are in a state of rapid change based on information released by regulatory bodies including the CDC and federal and state organizations. These policies and algorithms were followed during the patient's care in the ED.  Final Clinical Impression(s) / ED Diagnoses Final diagnoses:  COVID-19    Rx / DC Orders ED Discharge Orders    None       Sherrilee Gilles 02/20/20 2014    Geoffery Lyons, MD 02/24/20 1557

## 2020-02-20 NOTE — Telephone Encounter (Signed)
Pt's daughter called, her dad was dx with COVID 02/18/20 and is on ZPAK today congested, legs with edema and diarrhea.  Asked her totake to ER to be seen. Concern for PNA, hypokalemia and CHF.  She is agreeable and will take to [facility]

## 2020-02-20 NOTE — ED Notes (Signed)
CRITICAL VALUE ALERT  Critical Value:  Covid Positive  Date & Time Notied:  02/20/20, 1640  Provider Notified: Dr. Judd Lien  Orders Received/Actions taken: 1640

## 2020-02-20 NOTE — ED Triage Notes (Signed)
Pt states he tested positive for covid on 02/18/20 in [place].  Pt states he just does not feel well and has lower extremity edema. Denies pain.

## 2020-02-20 NOTE — ED Notes (Signed)
Ambulated patient in room. Pt O2 saturation maintained at 97% and HR 109.

## 2020-02-20 NOTE — Discharge Instructions (Addendum)
You were evaluated in the emergency department for evaluation after your diagnosis of COVID-19.  Your physical exam, blood work, chest x-ray, and EKG were very reassuring.  There appears to be a little bit of fluid in your lungs, possibly a small amount of pneumonia in the bottom of her left lung.  You may continue to take your chlorthalidone, your fluid pill, which should help clear the fluid.  You may follow-up with your primary care doctor.  Feel you are safe to go home at this time. Return to the emergency department for develop any worsening shortness of breath, cough, palpitations, nausea or vomiting that does not stop, or any other new severe symptoms

## 2020-02-22 ENCOUNTER — Telehealth (HOSPITAL_COMMUNITY): Payer: Self-pay | Admitting: Nurse Practitioner

## 2020-02-22 DIAGNOSIS — U071 COVID-19: Secondary | ICD-10-CM

## 2020-02-22 NOTE — Telephone Encounter (Signed)
Called to discuss with Katheren Shams about Covid symptoms and the use of a monoclonal antibody infusion for those with mild to moderate Covid symptoms and at a high risk of hospitalization.     Pt does not qualify for infusion therapy given symptoms first presented > 10 days prior to timing of infusion. Symptoms tier reviewed as well as criteria for ending isolation. Preventative practices reviewed. Patient verbalized understanding   Patient Active Problem List   Diagnosis Date Noted  . PAF (paroxysmal atrial fibrillation) (HCC) 09/20/2016  . Non-ST elevation (NSTEMI) myocardial infarction (HCC) 09/20/2016  . CKD (chronic kidney disease)   . Dyslipidemia 08/01/2013  . Carotid artery disease (HCC)   . Tremor   . Hypertension   . CAD (coronary artery disease)   . Hx of CABG     Consuello Masse, NP [phone] [email or web address]

## 2020-03-25 ENCOUNTER — Other Ambulatory Visit: Payer: Self-pay

## 2020-03-25 ENCOUNTER — Encounter: Payer: Self-pay | Admitting: Cardiology

## 2020-03-25 ENCOUNTER — Ambulatory Visit (INDEPENDENT_AMBULATORY_CARE_PROVIDER_SITE_OTHER): Payer: Medicare Other | Admitting: Cardiology

## 2020-03-25 VITALS — BP 124/72 | HR 64 | Ht 71.0 in | Wt 210.0 lb

## 2020-03-25 DIAGNOSIS — I442 Atrioventricular block, complete: Secondary | ICD-10-CM | POA: Diagnosis not present

## 2020-03-25 DIAGNOSIS — I480 Paroxysmal atrial fibrillation: Secondary | ICD-10-CM | POA: Diagnosis not present

## 2020-03-25 DIAGNOSIS — I25119 Atherosclerotic heart disease of native coronary artery with unspecified angina pectoris: Secondary | ICD-10-CM

## 2020-03-25 NOTE — Progress Notes (Signed)
Cardiology Office Note  Date: 03/25/2020   ID: William Hatfield, DOB 1945/08/05, MRN [id number]  PCP:  Pcp, No  Cardiologist:  Nona Dell, MD Electrophysiologist:  Lewayne Bunting, MD   Chief Complaint  Patient presents with  . Cardiac follow-up    History of Present Illness: William Hatfield is a 75 y.o. male last seen in October 2021.  He has had interval follow-up with EP, I reviewed the most recent note.  He is now status post placement of a Medtronic dual-chamber pacemaker for management of complete heart block, procedure performed in November 2021 by Dr. Ladona Ridgel.  He presents for a routine visit.  He tells me that his wife passed away earlier this month with COVID-19.  He had it as well.  Obviously, still dealing with this and fortunately has support from 2 daughters and a granddaughter that live locally.  In terms of energy level he does feel better since pacemaker implantation.  He does not describe any active angina or recent nitroglycerin use.  I reviewed his medications which are outlined below.  Past Medical History:  Diagnosis Date  . Atrial fibrillation (HCC)    a. post op in 2006  b. recurrence on 08/2016 admission for NSTEMI  . CAD (coronary artery disease)    a. 2006: s/p CABG at [facility] with LIMA to LAD, SVG to PDA, SVG to OM1, SVG to D1  b. 08/2016: NSTEMI 09/18/16 which showed severe native 3V CAD with patent SVG-->PLA, LIMA-->LAD and total occlusion of SVG--> OM1 with unsuccessful attempt at PCI of SVG--> OM with inability to restore flow.   . Carotid artery disease (HCC)   . CKD (chronic kidney disease)   . Essential hypertension   . Tremor     Past Surgical History:  Procedure Laterality Date  . CORONARY ARTERY BYPASS GRAFT  2006   [facility]  . CORONARY BALLOON ANGIOPLASTY N/A 09/18/2016   Procedure: Coronary Balloon Angioplasty;  Surgeon: Tonny Bollman, MD;  Location: [facility] INVASIVE CV LAB;  Service: Cardiovascular;  Laterality: N/A;  . LEFT HEART CATH AND CORS/GRAFTS  ANGIOGRAPHY N/A 09/18/2016   Procedure: Left Heart Cath and Cors/Grafts Angiography;  Surgeon: Tonny Bollman, MD;  Location: [facility] INVASIVE CV LAB;  Service: Cardiovascular;  Laterality: N/A;  . PACEMAKER IMPLANT N/A 01/12/2020   Procedure: PACEMAKER IMPLANT;  Surgeon: Marinus Maw, MD;  Location: [facility] INVASIVE CV LAB;  Service: Cardiovascular;  Laterality: N/A;    Current Outpatient Medications  Medication Sig Dispense Refill  . acetaminophen (TYLENOL) 500 MG tablet Take 1,000 mg by mouth every 6 (six) hours as needed for moderate pain or headache.    . allopurinol (ZYLOPRIM) 100 MG tablet Take 100 mg by mouth daily.    Marland Kitchen apixaban (ELIQUIS) 5 MG TABS tablet Take 1 tablet (5 mg total) by mouth 2 (two) times daily. 60 tablet 6  . atorvastatin (LIPITOR) 40 MG tablet Take 1 tablet (40 mg total) by mouth every evening. 90 tablet 1  . chlorthalidone (HYGROTON) 25 MG tablet Take 1 tablet (25 mg total) by mouth daily. (Patient taking differently: Take 12.5 mg by mouth 2 (two) times daily.) 90 tablet 1  . Coenzyme Q10 (COQ-10) 100 MG CAPS Take 100 mg by mouth daily.    . cyanocobalamin 500 MCG tablet Take 500 mcg by mouth daily.    . fluticasone (FLONASE) 50 MCG/ACT nasal spray Place 1 spray into both nostrils daily as needed for allergies or rhinitis.    . metoprolol tartrate (LOPRESSOR) 25 MG  tablet Take 1 tablet (25 mg total) by mouth 2 (two) times daily. 180 tablet 3  . Multiple Vitamin (MULTIVITAMIN) tablet Take 1 tablet by mouth daily.    . nitroGLYCERIN (NITROSTAT) 0.4 MG SL tablet Place 1 tablet (0.4 mg total) under the tongue every 5 (five) minutes x 3 doses as needed for chest pain. 25 tablet 0   No current facility-administered medications for this visit.   Allergies:  Niaspan [niacin]   ROS: No palpitations or syncope.  Physical Exam: VS:  BP 124/72   Pulse 64   Ht 5\' 11"  (1.803 m)   Wt 210 lb (95.3 kg)   SpO2 98%   BMI 29.29 kg/m , BMI Body mass index is 29.29 kg/m.  Wt  Readings from Last 3 Encounters:  03/25/20 210 lb (95.3 kg)  02/20/20 212 lb (96.2 kg)  01/12/20 216 lb (98 kg)    General: Patient appears comfortable at rest. HEENT: Conjunctiva and lids normal, wearing a mask. Neck: Supple, no elevated JVP or carotid bruits, no thyromegaly. Lungs: Clear to auscultation, nonlabored breathing at rest. Cardiac: Regular rate and rhythm, no S3, soft systolic murmur, no pericardial rub. Extremities: No pitting edema.  ECG:  An ECG dated 02/20/2020 was personally reviewed today and demonstrated:  Sinus rhythm with right bundle branch block.  Recent Labwork: 02/20/2020: ALT 43; AST 37; B Natriuretic Peptide 95.0; BUN 40; Creatinine, Ser 1.27; Hemoglobin 15.5; Platelets 307; Potassium 3.3; Sodium 138  May 2021: Cholesterol 127, triglycerides 113, HDL 30, LDL 76  Other Studies Reviewed Today:  Echocardiogram 01/01/2020: 1. Left ventricular ejection fraction, by estimation, is 60 to 65%. The  left ventricle has normal function. The left ventricle has no regional  wall motion abnormalities. There is mild left ventricular hypertrophy.  Left ventricular diastolic parameters  are consistent with Grade I diastolic dysfunction (impaired relaxation).  2. Right ventricular systolic function is normal. The right ventricular  size is normal.  3. Left atrial size was moderately dilated.  4. The mitral valve is normal in structure. Trivial mitral valve  regurgitation. No evidence of mitral stenosis.  5. The aortic valve is tricuspid. There is mild calcification of the  aortic valve. There is mild thickening of the aortic valve. Aortic valve  regurgitation is not visualized. No aortic stenosis is present.  6. The inferior vena cava is normal in size with greater than 50%  respiratory variability, suggesting right atrial pressure of 3 mmHg.   Assessment and Plan:  1.  High degree heart block status post Medtronic pacemaker implantation by Dr. 13/05/2019 back in  November 2021.  He does report more energy at this time.  No dizziness or syncope.  2.  Paroxysmal atrial fibrillation with CHA2DS2-VASc score of 3.  He reports no active palpitations and remains on Eliquis for stroke prophylaxis.  3.  Multivessel CAD status post CABG with known graft disease, occluded SVG to OM1 that is managed medically.  He does not report any active angina at this time.  Continue Lipitor and as needed nitroglycerin.  Medication Adjustments/Labs and Tests Ordered: Current medicines are reviewed at length with the patient today.  Concerns regarding medicines are outlined above.   Tests Ordered: No orders of the defined types were placed in this encounter.   Medication Changes: No orders of the defined types were placed in this encounter.   Disposition:  Follow up 6 months in the [place] office.  Signed, [place], MD, [facility] 03/25/2020 4:54 PM    [facility] at [place], [place], [place] [address] Phone: [phone]; Fax: [phone]

## 2020-03-25 NOTE — Patient Instructions (Addendum)

## 2020-03-26 ENCOUNTER — Telehealth: Payer: Self-pay | Admitting: Cardiology

## 2020-03-26 MED ORDER — CHLORTHALIDONE 25 MG PO TABS: 25.0000 mg | ORAL_TABLET | Freq: Every day | ORAL | 1 refills | Status: DC

## 2020-03-26 NOTE — Telephone Encounter (Signed)
° °  1. Which medications need to be refilled? (please list name of each medication and dose if known)   1. Chlorthalidone25 mg 2.  Allopurinol 100 mg   2. Which pharmacy/location (including street and city if local pharmacy) is medication to be sent to?  [facility]   3. Do they need a 30 day or 90 day supply? 90

## 2020-03-26 NOTE — Telephone Encounter (Signed)
Detailed voice message left for daughter Babette Relic) - we can fill the Chlorthalidone.  Will need to get the Allopurinol from prescribing physician.

## 2020-04-12 ENCOUNTER — Ambulatory Visit (INDEPENDENT_AMBULATORY_CARE_PROVIDER_SITE_OTHER): Payer: Medicare Other

## 2020-04-12 DIAGNOSIS — I442 Atrioventricular block, complete: Secondary | ICD-10-CM | POA: Diagnosis not present

## 2020-04-14 LAB — CUP PACEART REMOTE DEVICE CHECK
Battery Remaining Longevity: 161 mo
Battery Voltage: 3.21 V
Brady Statistic AP VP Percent: 2.06 %
Brady Statistic AP VS Percent: 0.38 %
Brady Statistic AS VP Percent: 40.28 %
Brady Statistic AS VS Percent: 57.28 %
Brady Statistic RA Percent Paced: 2.67 %
Brady Statistic RV Percent Paced: 42.24 %
Date Time Interrogation Session: 20220213231548
Implantable Lead Implant Date: 20211115
Implantable Lead Implant Date: 20211115
Implantable Lead Location: 753859
Implantable Lead Location: 753860
Implantable Lead Model: 5076
Implantable Lead Model: 7842
Implantable Lead Serial Number: 1057567
Implantable Pulse Generator Implant Date: 20211115
Lead Channel Impedance Value: 285 Ohm
Lead Channel Impedance Value: 494 Ohm
Lead Channel Impedance Value: 570 Ohm
Lead Channel Impedance Value: 722 Ohm
Lead Channel Pacing Threshold Amplitude: 0.75 V
Lead Channel Pacing Threshold Amplitude: 0.875 V
Lead Channel Pacing Threshold Pulse Width: 0.4 ms
Lead Channel Pacing Threshold Pulse Width: 0.4 ms
Lead Channel Sensing Intrinsic Amplitude: 19.375 mV
Lead Channel Sensing Intrinsic Amplitude: 19.375 mV
Lead Channel Sensing Intrinsic Amplitude: 2.5 mV
Lead Channel Sensing Intrinsic Amplitude: 2.5 mV
Lead Channel Setting Pacing Amplitude: 1.75 V
Lead Channel Setting Pacing Amplitude: 2.5 V
Lead Channel Setting Pacing Pulse Width: 0.4 ms
Lead Channel Setting Sensing Sensitivity: 1.2 mV

## 2020-04-15 NOTE — Progress Notes (Signed)
Remote pacemaker transmission.   

## 2020-04-27 ENCOUNTER — Encounter: Payer: Self-pay | Admitting: Internal Medicine

## 2020-04-27 ENCOUNTER — Other Ambulatory Visit: Payer: Self-pay

## 2020-04-27 ENCOUNTER — Ambulatory Visit (INDEPENDENT_AMBULATORY_CARE_PROVIDER_SITE_OTHER): Payer: Medicare Other | Admitting: Internal Medicine

## 2020-04-27 VITALS — BP 148/82 | HR 60 | Ht 71.0 in | Wt 194.0 lb

## 2020-04-27 DIAGNOSIS — I480 Paroxysmal atrial fibrillation: Secondary | ICD-10-CM | POA: Diagnosis not present

## 2020-04-27 DIAGNOSIS — I442 Atrioventricular block, complete: Secondary | ICD-10-CM

## 2020-04-27 DIAGNOSIS — I25119 Atherosclerotic heart disease of native coronary artery with unspecified angina pectoris: Secondary | ICD-10-CM

## 2020-04-27 DIAGNOSIS — I25708 Atherosclerosis of coronary artery bypass graft(s), unspecified, with other forms of angina pectoris: Secondary | ICD-10-CM | POA: Diagnosis not present

## 2020-04-27 LAB — CUP PACEART INCLINIC DEVICE CHECK
Battery Remaining Longevity: 163 mo
Battery Voltage: 3.21 V
Brady Statistic AP VP Percent: 3.2 %
Brady Statistic AP VS Percent: 0.61 %
Brady Statistic AS VP Percent: 37.24 %
Brady Statistic AS VS Percent: 58.95 %
Brady Statistic RA Percent Paced: 4.46 %
Brady Statistic RV Percent Paced: 40.25 %
Date Time Interrogation Session: 20220301130648
Implantable Lead Implant Date: 20211115
Implantable Lead Implant Date: 20211115
Implantable Lead Location: 753859
Implantable Lead Location: 753860
Implantable Lead Model: 5076
Implantable Lead Model: 7842
Implantable Lead Serial Number: 1057567
Implantable Pulse Generator Implant Date: 20211115
Lead Channel Impedance Value: 304 Ohm
Lead Channel Impedance Value: 513 Ohm
Lead Channel Impedance Value: 608 Ohm
Lead Channel Impedance Value: 798 Ohm
Lead Channel Pacing Threshold Amplitude: 1 V
Lead Channel Pacing Threshold Amplitude: 1.5 V
Lead Channel Pacing Threshold Pulse Width: 0.4 ms
Lead Channel Pacing Threshold Pulse Width: 0.4 ms
Lead Channel Sensing Intrinsic Amplitude: 1.6 mV
Lead Channel Sensing Intrinsic Amplitude: 16.3 mV
Lead Channel Setting Pacing Amplitude: 1.75 V
Lead Channel Setting Pacing Amplitude: 2.5 V
Lead Channel Setting Pacing Pulse Width: 0.4 ms
Lead Channel Setting Sensing Sensitivity: 1.2 mV

## 2020-04-27 NOTE — Patient Instructions (Signed)

## 2020-04-27 NOTE — Progress Notes (Signed)
HPI William Hatfield returns today for followup. He is a pleasant 75 yo man with CHB, s/p PPM insertion and HTN. He has PAF. He had several episodes back in December which coincided with Covid 19. He denies chest pain or sob. No syncope. No palpitations. He lost his wife to [place] in January.  Allergies  Allergen Reactions  . Niaspan [Niacin] Hives and Itching    NIASPAN ONLY     Current Outpatient Medications  Medication Sig Dispense Refill  . acetaminophen (TYLENOL) 500 MG tablet Take 1,000 mg by mouth every 6 (six) hours as needed for moderate pain or headache.    . allopurinol (ZYLOPRIM) 100 MG tablet Take 100 mg by mouth daily.    Marland Kitchen apixaban (ELIQUIS) 5 MG TABS tablet Take 1 tablet (5 mg total) by mouth 2 (two) times daily. 60 tablet 6  . atorvastatin (LIPITOR) 40 MG tablet Take 1 tablet (40 mg total) by mouth every evening. 90 tablet 1  . chlorthalidone (HYGROTON) 25 MG tablet Take 1 tablet (25 mg total) by mouth daily. 90 tablet 1  . Coenzyme Q10 (COQ-10) 100 MG CAPS Take 100 mg by mouth daily.    . cyanocobalamin 500 MCG tablet Take 500 mcg by mouth daily.    . fluticasone (FLONASE) 50 MCG/ACT nasal spray Place 1 spray into both nostrils daily as needed for allergies or rhinitis.    . metoprolol tartrate (LOPRESSOR) 25 MG tablet Take 1 tablet (25 mg total) by mouth 2 (two) times daily. 180 tablet 3  . Multiple Vitamin (MULTIVITAMIN) tablet Take 1 tablet by mouth daily.    . nitroGLYCERIN (NITROSTAT) 0.4 MG SL tablet Place 1 tablet (0.4 mg total) under the tongue every 5 (five) minutes x 3 doses as needed for chest pain. 25 tablet 0   No current facility-administered medications for this visit.     Past Medical History:  Diagnosis Date  . Atrial fibrillation (HCC)    a. post op in 2006  b. recurrence on 08/2016 admission for NSTEMI  . CAD (coronary artery disease)    a. 2006: s/p CABG at [facility] with LIMA to LAD, SVG to PDA, SVG to OM1, SVG to D1  b. 08/2016: NSTEMI 09/18/16 which  showed severe native 3V CAD with patent SVG-->PLA, LIMA-->LAD and total occlusion of SVG--> OM1 with unsuccessful attempt at PCI of SVG--> OM with inability to restore flow.   . Carotid artery disease (HCC)   . CKD (chronic kidney disease)   . Essential hypertension   . Tremor     ROS:   All systems reviewed and negative except as noted in the HPI.   Past Surgical History:  Procedure Laterality Date  . CORONARY ARTERY BYPASS GRAFT  2006   [facility]  . CORONARY BALLOON ANGIOPLASTY N/A 09/18/2016   Procedure: Coronary Balloon Angioplasty;  Surgeon: Tonny Bollman, MD;  Location: [facility] INVASIVE CV LAB;  Service: Cardiovascular;  Laterality: N/A;  . LEFT HEART CATH AND CORS/GRAFTS ANGIOGRAPHY N/A 09/18/2016   Procedure: Left Heart Cath and Cors/Grafts Angiography;  Surgeon: Tonny Bollman, MD;  Location: [facility] INVASIVE CV LAB;  Service: Cardiovascular;  Laterality: N/A;  . PACEMAKER IMPLANT N/A 01/12/2020   Procedure: PACEMAKER IMPLANT;  Surgeon: Marinus Maw, MD;  Location: [facility] INVASIVE CV LAB;  Service: Cardiovascular;  Laterality: N/A;     Family History  Problem Relation Age of Onset  . Heart attack Father   . Heart attack Paternal Grandfather      Social  History   Socioeconomic History  . Marital status: Widowed    Spouse name: Not on file  . Number of children: Not on file  . Years of education: Not on file  . Highest education level: Not on file  Occupational History  . Not on file  Tobacco Use  . Smoking status: Former Smoker    Packs/day: 4.00    Years: 20.00    Pack years: 80.00    Types: Cigarettes    Quit date: 02/28/1979    Years since quitting: 41.1  . Smokeless tobacco: Former Neurosurgeon    Types: Snuff, Dorna Bloom    Quit date: 02/27/1981  Vaping Use  . Vaping Use: Never used  Substance and Sexual Activity  . Alcohol use: No    Alcohol/week: 0.0 standard drinks  . Drug use: No  . Sexual activity: Not on file  Other Topics Concern  . Not on file  Social History  Narrative  . Not on file   Social Determinants of Health   Financial Resource Strain: Not on file  Food Insecurity: Not on file  Transportation Needs: Not on file  Physical Activity: Not on file  Stress: Not on file  Social Connections: Not on file  Intimate Partner Violence: Not on file     BP (!) 148/82   Pulse 60   Ht 5\' 11"  (1.803 m)   Wt 194 lb (88 kg)   SpO2 98%   BMI 27.06 kg/m   Physical Exam:  Well appearing NAD HEENT: Unremarkable Neck:  No JVD, no thyromegally Lymphatics:  No adenopathy Back:  No CVA tenderness Lungs:  Clear with no wheezes HEART:  Regular rate rhythm, no murmurs, no rubs, no clicks Abd:  soft, positive bowel sounds, no organomegally, no rebound, no guarding Ext:  2 plus pulses, no edema, no cyanosis, no clubbing Skin:  No rashes no nodules Neuro:  CN II through XII intact, motor grossly intact  DEVICE  Normal device function.  See PaceArt for details.   Assess/Plan: 1. CHB - he is asymptomatic s/p PPM insertion. 2. PPM - his medtronic DDD PM is working normally. 3. PAF - he is maintaining NSR since his Covid infection. He will continue eliquis. 4. CAD - he remains active and denies anginal symptoms.   William Gamero,MD

## 2020-07-12 ENCOUNTER — Ambulatory Visit (INDEPENDENT_AMBULATORY_CARE_PROVIDER_SITE_OTHER): Payer: Medicare Other

## 2020-07-12 DIAGNOSIS — I442 Atrioventricular block, complete: Secondary | ICD-10-CM

## 2020-07-12 DIAGNOSIS — Z950 Presence of cardiac pacemaker: Secondary | ICD-10-CM | POA: Diagnosis not present

## 2020-07-13 LAB — CUP PACEART REMOTE DEVICE CHECK
Battery Remaining Longevity: 159 mo
Battery Voltage: 3.19 V
Brady Statistic AP VP Percent: 12.47 %
Brady Statistic AP VS Percent: 1.72 %
Brady Statistic AS VP Percent: 61.79 %
Brady Statistic AS VS Percent: 24.02 %
Brady Statistic RA Percent Paced: 14.24 %
Brady Statistic RV Percent Paced: 74.25 %
Date Time Interrogation Session: 20220516075218
Implantable Lead Implant Date: 20211115
Implantable Lead Implant Date: 20211115
Implantable Lead Location: 753859
Implantable Lead Location: 753860
Implantable Lead Model: 5076
Implantable Lead Model: 7842
Implantable Lead Serial Number: 1057567
Implantable Pulse Generator Implant Date: 20211115
Lead Channel Impedance Value: 304 Ohm
Lead Channel Impedance Value: 513 Ohm
Lead Channel Impedance Value: 589 Ohm
Lead Channel Impedance Value: 741 Ohm
Lead Channel Pacing Threshold Amplitude: 0.75 V
Lead Channel Pacing Threshold Amplitude: 0.75 V
Lead Channel Pacing Threshold Pulse Width: 0.4 ms
Lead Channel Pacing Threshold Pulse Width: 0.4 ms
Lead Channel Sensing Intrinsic Amplitude: 18.125 mV
Lead Channel Sensing Intrinsic Amplitude: 18.125 mV
Lead Channel Sensing Intrinsic Amplitude: 2.25 mV
Lead Channel Sensing Intrinsic Amplitude: 2.25 mV
Lead Channel Setting Pacing Amplitude: 1.75 V
Lead Channel Setting Pacing Amplitude: 2.5 V
Lead Channel Setting Pacing Pulse Width: 0.4 ms
Lead Channel Setting Sensing Sensitivity: 1.2 mV

## 2020-08-03 NOTE — Progress Notes (Signed)
Remote pacemaker transmission.   

## 2020-08-06 ENCOUNTER — Other Ambulatory Visit: Payer: Self-pay | Admitting: Family Medicine

## 2020-09-23 ENCOUNTER — Ambulatory Visit: Payer: Medicare Other | Admitting: Cardiology

## 2020-09-27 ENCOUNTER — Ambulatory Visit (INDEPENDENT_AMBULATORY_CARE_PROVIDER_SITE_OTHER): Payer: Medicare Other | Admitting: Cardiology

## 2020-09-27 ENCOUNTER — Telehealth: Payer: Self-pay

## 2020-09-27 ENCOUNTER — Encounter: Payer: Self-pay | Admitting: Cardiology

## 2020-09-27 ENCOUNTER — Other Ambulatory Visit: Payer: Self-pay

## 2020-09-27 ENCOUNTER — Other Ambulatory Visit (HOSPITAL_COMMUNITY)
Admission: RE | Admit: 2020-09-27 | Discharge: 2020-09-27 | Disposition: A | Discharge status: Home / Self Care | Payer: Medicare Other | Source: Ambulatory Visit | Attending: Cardiology | Admitting: Cardiology

## 2020-09-27 VITALS — BP 132/62 | HR 71 | Ht 71.0 in | Wt 199.2 lb

## 2020-09-27 DIAGNOSIS — E785 Hyperlipidemia, unspecified: Secondary | ICD-10-CM | POA: Diagnosis present

## 2020-09-27 DIAGNOSIS — I25119 Atherosclerotic heart disease of native coronary artery with unspecified angina pectoris: Secondary | ICD-10-CM | POA: Diagnosis not present

## 2020-09-27 DIAGNOSIS — I480 Paroxysmal atrial fibrillation: Secondary | ICD-10-CM | POA: Diagnosis present

## 2020-09-27 LAB — CBC
HCT: 43.1 % (ref 39.0–52.0)
Hemoglobin: 14.4 g/dL (ref 13.0–17.0)
MCH: 30.1 pg (ref 26.0–34.0)
MCHC: 33.4 g/dL (ref 30.0–36.0)
MCV: 90.2 fL (ref 80.0–100.0)
Platelets: 226 10*3/uL (ref 150–400)
RBC: 4.78 MIL/uL (ref 4.22–5.81)
RDW: 13.9 % (ref 11.5–15.5)
WBC: 8.9 10*3/uL (ref 4.0–10.5)
nRBC: 0 % (ref 0.0–0.2)

## 2020-09-27 LAB — LIPID PANEL
Cholesterol: 116 mg/dL (ref 0–200)
HDL: 31 mg/dL — ABNORMAL LOW (ref >40)
LDL Cholesterol: 65 mg/dL (ref 0–99)
Total CHOL/HDL Ratio: 3.7 RATIO
Triglycerides: 102 mg/dL (ref <150)
VLDL: 20 mg/dL (ref 0–40)

## 2020-09-27 LAB — COMPREHENSIVE METABOLIC PANEL
ALT: 23 U/L (ref 0–44)
AST: 20 U/L (ref 15–41)
Albumin: 4.2 g/dL (ref 3.5–5.0)
Alkaline Phosphatase: 64 U/L (ref 38–126)
Anion gap: 7 (ref 5–15)
BUN: 21 mg/dL (ref 8–23)
CO2: 30 mmol/L (ref 22–32)
Calcium: 9.4 mg/dL (ref 8.9–10.3)
Chloride: 99 mmol/L (ref 98–111)
Creatinine, Ser: 1.18 mg/dL (ref 0.61–1.24)
GFR, Estimated: >60 mL/min (ref >60)
Glucose, Bld: 112 mg/dL — ABNORMAL HIGH (ref 70–99)
Potassium: 3.7 mmol/L (ref 3.5–5.1)
Sodium: 136 mmol/L (ref 135–145)
Total Bilirubin: 0.8 mg/dL (ref 0.3–1.2)
Total Protein: 7.2 g/dL (ref 6.5–8.1)

## 2020-09-27 MED ORDER — EMPAGLIFLOZIN 10 MG PO TABS: 10.0000 mg | ORAL_TABLET | Freq: Every day | ORAL | 6 refills | Status: DC

## 2020-09-27 NOTE — Telephone Encounter (Signed)
Patient agrees to start Jardiance 10 mg daily. He asked I send a 30 day rx to [facility] in [place], [place]

## 2020-09-27 NOTE — Telephone Encounter (Signed)
-----   Message from Jonelle Sidle, MD sent at 09/27/2020  2:54 PM EDT ----- Results reviewed.  Lipids are pending.  Potassium normal, creatinine normal, LFTs normal, hemoglobin normal, and platelets normal.  Glucose is elevated and has been by prior blood work.  If he is willing, suggest starting Jardiance 10 mg daily as this will also help reduce his cardiac risk.

## 2020-09-27 NOTE — Progress Notes (Signed)
Cardiology Office Note  Date: 09/27/2020   ID: Samer Dutton, DOB [birth year], MRN [id number]  PCP:  Pcp, No  Cardiologist:  Nona Dell, MD Electrophysiologist:  Lewayne Bunting, MD   Chief Complaint  Patient presents with   Cardiac follow-up    History of Present Illness: William Hatfield is a 76 y.o. male last seen in January.  He is here for a routine visit.  Reports no angina symptoms or nitroglycerin use, no sense of palpitations.  He has a Medtronic pacemaker in place with follow-up by Dr. Ladona Ridgel.  Device interrogation in May revealed normal function.  We went over his medications today.  No spontaneous bleeding problems on Eliquis.  We did discuss arranging follow-up lab work.   Past Medical History:  Diagnosis Date   Atrial fibrillation (HCC)    a. post op in 2006  b. recurrence on 08/2016 admission for NSTEMI   CAD (coronary artery disease)    a. 2006: s/p CABG at [facility] with LIMA to LAD, SVG to PDA, SVG to OM1, SVG to D1  b. 08/2016: NSTEMI 09/18/16 which showed severe native 3V CAD with patent SVG-->PLA, LIMA-->LAD and total occlusion of SVG--> OM1 with unsuccessful attempt at PCI of SVG--> OM with inability to restore flow.    Carotid artery disease (HCC)    CKD (chronic kidney disease)    Essential hypertension    Tremor     Past Surgical History:  Procedure Laterality Date   CORONARY ARTERY BYPASS GRAFT  2006   [facility]   CORONARY BALLOON ANGIOPLASTY N/A 09/18/2016   Procedure: Coronary Balloon Angioplasty;  Surgeon: Tonny Bollman, MD;  Location: [facility] INVASIVE CV LAB;  Service: Cardiovascular;  Laterality: N/A;   LEFT HEART CATH AND CORS/GRAFTS ANGIOGRAPHY N/A 09/18/2016   Procedure: Left Heart Cath and Cors/Grafts Angiography;  Surgeon: Tonny Bollman, MD;  Location: [facility] INVASIVE CV LAB;  Service: Cardiovascular;  Laterality: N/A;   PACEMAKER IMPLANT N/A 01/12/2020   Procedure: PACEMAKER IMPLANT;  Surgeon: Marinus Maw, MD;  Location: [facility] INVASIVE CV LAB;  Service:  Cardiovascular;  Laterality: N/A;    Current Outpatient Medications  Medication Sig Dispense Refill   acetaminophen (TYLENOL) 500 MG tablet Take 1,000 mg by mouth every 6 (six) hours as needed for moderate pain or headache.     allopurinol (ZYLOPRIM) 100 MG tablet Take 100 mg by mouth daily.     apixaban (ELIQUIS) 5 MG TABS tablet Take 1 tablet (5 mg total) by mouth 2 (two) times daily. 60 tablet 6   atorvastatin (LIPITOR) 40 MG tablet TAKE 1 TABLET (40 MG TOTAL) BY MOUTH EVERY EVENING. 90 tablet 1   chlorthalidone (HYGROTON) 25 MG tablet Take 1 tablet (25 mg total) by mouth daily. 90 tablet 1   Coenzyme Q10 (COQ-10) 100 MG CAPS Take 100 mg by mouth daily.     cyanocobalamin 500 MCG tablet Take 500 mcg by mouth daily.     fluticasone (FLONASE) 50 MCG/ACT nasal spray Place 1 spray into both nostrils daily as needed for allergies or rhinitis.     Multiple Vitamin (MULTIVITAMIN) tablet Take 1 tablet by mouth daily.     nitroGLYCERIN (NITROSTAT) 0.4 MG SL tablet Place 1 tablet (0.4 mg total) under the tongue every 5 (five) minutes x 3 doses as needed for chest pain. 25 tablet 0   metoprolol tartrate (LOPRESSOR) 25 MG tablet Take 1 tablet (25 mg total) by mouth 2 (two) times daily. 180 tablet 3   No current facility-administered medications for  this visit.   Allergies:  Niaspan [niacin]   ROS: No dizziness or syncope.  Physical Exam: VS:  BP 132/62   Pulse 71   Ht 5\' 11"  (1.803 m)   Wt 199 lb 3.2 oz (90.4 kg)   SpO2 98%   BMI 27.78 kg/m , BMI Body mass index is 27.78 kg/m.  Wt Readings from Last 3 Encounters:  09/27/20 199 lb 3.2 oz (90.4 kg)  04/27/20 194 lb (88 kg)  03/25/20 210 lb (95.3 kg)    General: Patient appears comfortable at rest. HEENT: Conjunctiva and lids normal, wearing a mask. Neck: Supple, no elevated JVP or carotid bruits, no thyromegaly. Lungs: Clear to auscultation, nonlabored breathing at rest. Cardiac: Regular rate and rhythm, no S3, 1/6 systolic murmur, no  pericardial rub. Extremities: No pitting edema.  ECG:  An ECG dated 02/20/2020 was personally reviewed today and demonstrated:  Sinus rhythm with right bundle branch block.  Recent Labwork: 02/20/2020: ALT 43; AST 37; B Natriuretic Peptide 95.0; BUN 40; Creatinine, Ser 1.27; Hemoglobin 15.5; Platelets 307; Potassium 3.3; Sodium 138  May 2021: Cholesterol 127, triglycerides 113, HDL 30, LDL 76  Other Studies Reviewed Today:  Echocardiogram 01/01/2020:  1. Left ventricular ejection fraction, by estimation, is 60 to 65%. The  left ventricle has normal function. The left ventricle has no regional  wall motion abnormalities. There is mild left ventricular hypertrophy.  Left ventricular diastolic parameters  are consistent with Grade I diastolic dysfunction (impaired relaxation).   2. Right ventricular systolic function is normal. The right ventricular  size is normal.   3. Left atrial size was moderately dilated.   4. The mitral valve is normal in structure. Trivial mitral valve  regurgitation. No evidence of mitral stenosis.   5. The aortic valve is tricuspid. There is mild calcification of the  aortic valve. There is mild thickening of the aortic valve. Aortic valve  regurgitation is not visualized. No aortic stenosis is present.   6. The inferior vena cava is normal in size with greater than 50%  respiratory variability, suggesting right atrial pressure of 3 mmHg.  Assessment and Plan:  1.  Paroxysmal atrial fibrillation with CHA2DS2-VASc score of 3.  He is asymptomatic and remains on Eliquis for stroke prophylaxis.  Check CBC and BMET.  2.  High degree heart block status post Medtronic pacemaker.  He continues to follow with Dr. 13/05/2019.  No dizziness or syncope.  3.  Multivessel CAD status post CABG with graft disease, SVG to OM occlusion managed medically.  Continue Lopressor and Lipitor.  Check FLP and LFTs.  Medication Adjustments/Labs and Tests Ordered: Current medicines are  reviewed at length with the patient today.  Concerns regarding medicines are outlined above.   Tests Ordered: Orders Placed This Encounter  Procedures   CBC   Comprehensive metabolic panel   Lipid Profile     Medication Changes: No orders of the defined types were placed in this encounter.   Disposition:  Follow up  6 months.  Signed, Ladona Ridgel, MD, [facility] 09/27/2020 1:45 PM    [facility] at [facility] [address] [place], [place] Phone: [phone]; Fax: [phone]

## 2020-09-27 NOTE — Patient Instructions (Signed)
Medication Instructions:  Your physician recommends that you continue on your current medications as directed. Please refer to the Current Medication list given to you today.  *If you need a refill on your cardiac medications before your next appointment, please call your pharmacy*   Lab Work: LIPIDS, CMET, CBC  If you have labs (blood work) drawn today and your tests are completely normal, you will receive your results only by: MyChart Message (if you have MyChart) OR A paper copy in the mail If you have any lab test that is abnormal or we need to change your treatment, we will call you to review the results.   Testing/Procedures: None today    Follow-Up: At [facility], you and your health needs are our priority.  As part of our continuing mission to provide you with exceptional heart care, we have created designated Provider Care Teams.  These Care Teams include your primary Cardiologist (physician) and Advanced Practice Providers (APPs -  Physician Assistants and Nurse Practitioners) who all work together to provide you with the care you need, when you need it.  We recommend signing up for the patient portal called "MyChart".  Sign up information is provided on this After Visit Summary.  MyChart is used to connect with patients for Virtual Visits (Telemedicine).  Patients are able to view lab/test results, encounter notes, upcoming appointments, etc.  Non-urgent messages can be sent to your provider as well.   To learn more about what you can do with MyChart, go to [email or web address].    Your next appointment:   6 month(s)  The format for your next appointment:   In Person  Provider:   Nona Dell, MD   Other Instructions None

## 2020-10-11 ENCOUNTER — Ambulatory Visit (INDEPENDENT_AMBULATORY_CARE_PROVIDER_SITE_OTHER): Payer: Medicare Other

## 2020-10-11 DIAGNOSIS — I441 Atrioventricular block, second degree: Secondary | ICD-10-CM

## 2020-10-11 LAB — CUP PACEART REMOTE DEVICE CHECK
Battery Remaining Longevity: 152 mo
Battery Voltage: 3.15 V
Brady Statistic AP VP Percent: 6.09 %
Brady Statistic AP VS Percent: 0.53 %
Brady Statistic AS VP Percent: 72.52 %
Brady Statistic AS VS Percent: 20.86 %
Brady Statistic RA Percent Paced: 7.13 %
Brady Statistic RV Percent Paced: 78.61 %
Date Time Interrogation Session: 20220814225554
Implantable Lead Implant Date: 20211115
Implantable Lead Implant Date: 20211115
Implantable Lead Location: 753859
Implantable Lead Location: 753860
Implantable Lead Model: 5076
Implantable Lead Model: 7842
Implantable Lead Serial Number: 1057567
Implantable Pulse Generator Implant Date: 20211115
Lead Channel Impedance Value: 285 Ohm
Lead Channel Impedance Value: 494 Ohm
Lead Channel Impedance Value: 532 Ohm
Lead Channel Impedance Value: 703 Ohm
Lead Channel Pacing Threshold Amplitude: 0.75 V
Lead Channel Pacing Threshold Amplitude: 0.75 V
Lead Channel Pacing Threshold Pulse Width: 0.4 ms
Lead Channel Pacing Threshold Pulse Width: 0.4 ms
Lead Channel Sensing Intrinsic Amplitude: 1.875 mV
Lead Channel Sensing Intrinsic Amplitude: 1.875 mV
Lead Channel Sensing Intrinsic Amplitude: 18 mV
Lead Channel Sensing Intrinsic Amplitude: 18 mV
Lead Channel Setting Pacing Amplitude: 1.75 V
Lead Channel Setting Pacing Amplitude: 2.5 V
Lead Channel Setting Pacing Pulse Width: 0.4 ms
Lead Channel Setting Sensing Sensitivity: 1.2 mV

## 2020-10-29 NOTE — Progress Notes (Signed)
Remote pacemaker transmission.   

## 2020-11-12 ENCOUNTER — Other Ambulatory Visit: Payer: Self-pay | Admitting: Cardiology

## 2021-01-10 ENCOUNTER — Ambulatory Visit (INDEPENDENT_AMBULATORY_CARE_PROVIDER_SITE_OTHER): Payer: Medicare Other

## 2021-01-10 DIAGNOSIS — I441 Atrioventricular block, second degree: Secondary | ICD-10-CM

## 2021-01-10 LAB — CUP PACEART REMOTE DEVICE CHECK
Battery Remaining Longevity: 148 mo
Battery Voltage: 3.09 V
Brady Statistic AP VP Percent: 5.97 %
Brady Statistic AP VS Percent: 0.43 %
Brady Statistic AS VP Percent: 77.02 %
Brady Statistic AS VS Percent: 16.59 %
Brady Statistic RA Percent Paced: 6.69 %
Brady Statistic RV Percent Paced: 83.02 %
Date Time Interrogation Session: 20221114003701
Implantable Lead Implant Date: 20211115
Implantable Lead Implant Date: 20211115
Implantable Lead Location: 753859
Implantable Lead Location: 753860
Implantable Lead Model: 5076
Implantable Lead Model: 7842
Implantable Lead Serial Number: 1057567
Implantable Pulse Generator Implant Date: 20211115
Lead Channel Impedance Value: 285 Ohm
Lead Channel Impedance Value: 513 Ohm
Lead Channel Impedance Value: 570 Ohm
Lead Channel Impedance Value: 722 Ohm
Lead Channel Pacing Threshold Amplitude: 0.625 V
Lead Channel Pacing Threshold Amplitude: 0.875 V
Lead Channel Pacing Threshold Pulse Width: 0.4 ms
Lead Channel Pacing Threshold Pulse Width: 0.4 ms
Lead Channel Sensing Intrinsic Amplitude: 1.75 mV
Lead Channel Sensing Intrinsic Amplitude: 1.75 mV
Lead Channel Sensing Intrinsic Amplitude: 17 mV
Lead Channel Sensing Intrinsic Amplitude: 17 mV
Lead Channel Setting Pacing Amplitude: 2 V
Lead Channel Setting Pacing Amplitude: 2.5 V
Lead Channel Setting Pacing Pulse Width: 0.4 ms
Lead Channel Setting Sensing Sensitivity: 1.2 mV

## 2021-01-17 NOTE — Progress Notes (Signed)
Remote pacemaker transmission.   

## 2021-02-25 ENCOUNTER — Other Ambulatory Visit: Payer: Self-pay | Admitting: Student

## 2021-02-25 DIAGNOSIS — I480 Paroxysmal atrial fibrillation: Secondary | ICD-10-CM

## 2021-04-04 ENCOUNTER — Ambulatory Visit (INDEPENDENT_AMBULATORY_CARE_PROVIDER_SITE_OTHER): Payer: Medicare Other | Admitting: Cardiology

## 2021-04-04 ENCOUNTER — Other Ambulatory Visit: Payer: Self-pay

## 2021-04-04 ENCOUNTER — Encounter: Payer: Self-pay | Admitting: Cardiology

## 2021-04-04 VITALS — BP 168/90 | HR 77 | Ht 71.0 in | Wt 209.6 lb

## 2021-04-04 DIAGNOSIS — I10 Essential (primary) hypertension: Secondary | ICD-10-CM

## 2021-04-04 DIAGNOSIS — I480 Paroxysmal atrial fibrillation: Secondary | ICD-10-CM

## 2021-04-04 DIAGNOSIS — I25119 Atherosclerotic heart disease of native coronary artery with unspecified angina pectoris: Secondary | ICD-10-CM

## 2021-04-04 MED ORDER — LOSARTAN POTASSIUM 25 MG PO TABS: 25.0000 mg | ORAL_TABLET | Freq: Every day | ORAL | 3 refills | Status: DC

## 2021-04-04 NOTE — Progress Notes (Signed)
Cardiology Office Note  Date: 04/04/2021   ID: William Hatfield, DOB 06/11/1945, MRN [id number]  PCP:  Pcp, No  Cardiologist:  Nona Dell, MD Electrophysiologist:  Lewayne Bunting, MD   Chief Complaint  Patient presents with   Cardiac follow-up    History of Present Illness: William Hatfield is a 76 y.o. male seen in August 2022.  He is here for a routine visit.  Still working for a Programme researcher, broadcasting/film/video.  Reports no active angina symptoms or sense of palpitations.  NYHA class II dyspnea.  He has a Medtronic pacemaker in place with follow-up with Dr. Ladona Ridgel.  Last device interrogation indicated normal function with less than 1% AT/AF burden.  I personally reviewed his ECG today which shows a ventricular paced rhythm with atrial tracking, PVC.  We went over his medications which are listed below.  He reports compliance with therapy, no spontaneous bleeding problems on Eliquis.  We discussed the addition of losartan for better blood pressure control.  Past Medical History:  Diagnosis Date   Atrial fibrillation (HCC)    a. post op in 2006  b. recurrence on 08/2016 admission for NSTEMI   CAD (coronary artery disease)    a. 2006: s/p CABG at [facility] with LIMA to LAD, SVG to PDA, SVG to OM1, SVG to D1  b. 08/2016: NSTEMI 09/18/16 which showed severe native 3V CAD with patent SVG-->PLA, LIMA-->LAD and total occlusion of SVG--> OM1 with unsuccessful attempt at PCI of SVG--> OM with inability to restore flow.    Carotid artery disease (HCC)    CKD (chronic kidney disease)    Essential hypertension    Tremor     Past Surgical History:  Procedure Laterality Date   CORONARY ARTERY BYPASS GRAFT  2006   [facility]   CORONARY BALLOON ANGIOPLASTY N/A 09/18/2016   Procedure: Coronary Balloon Angioplasty;  Surgeon: Tonny Bollman, MD;  Location: [facility] INVASIVE CV LAB;  Service: Cardiovascular;  Laterality: N/A;   LEFT HEART CATH AND CORS/GRAFTS ANGIOGRAPHY N/A 09/18/2016   Procedure: Left Heart Cath and Cors/Grafts  Angiography;  Surgeon: Tonny Bollman, MD;  Location: [facility] INVASIVE CV LAB;  Service: Cardiovascular;  Laterality: N/A;   PACEMAKER IMPLANT N/A 01/12/2020   Procedure: PACEMAKER IMPLANT;  Surgeon: Marinus Maw, MD;  Location: [facility] INVASIVE CV LAB;  Service: Cardiovascular;  Laterality: N/A;    Current Outpatient Medications  Medication Sig Dispense Refill   acetaminophen (TYLENOL) 500 MG tablet Take 1,000 mg by mouth every 6 (six) hours as needed for moderate pain or headache.     allopurinol (ZYLOPRIM) 100 MG tablet Take 100 mg by mouth daily.     apixaban (ELIQUIS) 5 MG TABS tablet Take 1 tablet (5 mg total) by mouth 2 (two) times daily. 60 tablet 6   atorvastatin (LIPITOR) 40 MG tablet TAKE 1 TABLET (40 MG TOTAL) BY MOUTH EVERY EVENING. 90 tablet 1   chlorthalidone (HYGROTON) 25 MG tablet TAKE 1 TABLET EVERY DAY 90 tablet 1   Coenzyme Q10 (COQ-10) 100 MG CAPS Take 100 mg by mouth daily.     cyanocobalamin 500 MCG tablet Take 500 mcg by mouth daily.     empagliflozin (JARDIANCE) 10 MG TABS tablet Take 1 tablet (10 mg total) by mouth daily before breakfast. 30 tablet 6   fluticasone (FLONASE) 50 MCG/ACT nasal spray Place 1 spray into both nostrils daily as needed for allergies or rhinitis.     metoprolol tartrate (LOPRESSOR) 25 MG tablet TAKE 1 TABLET TWICE DAILY 180 tablet  3   Multiple Vitamin (MULTIVITAMIN) tablet Take 1 tablet by mouth daily.     nitroGLYCERIN (NITROSTAT) 0.4 MG SL tablet Place 1 tablet (0.4 mg total) under the tongue every 5 (five) minutes x 3 doses as needed for chest pain. 25 tablet 0   No current facility-administered medications for this visit.   Allergies:  Niaspan [niacin]   ROS: Orthopnea or PND.  No syncope.  Physical Exam: VS:  BP (!) 168/90    Pulse 77    Ht 5\' 11"  (1.803 m)    Wt 209 lb 9.6 oz (95.1 kg)    SpO2 99%    BMI 29.23 kg/m , BMI Body mass index is 29.23 kg/m.  Wt Readings from Last 3 Encounters:  04/04/21 209 lb 9.6 oz (95.1 kg)  09/27/20  199 lb 3.2 oz (90.4 kg)  04/27/20 194 lb (88 kg)    General: Patient appears comfortable at rest. HEENT: Conjunctiva and lids normal, wearing a mask. Neck: Supple, no elevated JVP or carotid bruits, no thyromegaly. Lungs: Clear to auscultation, nonlabored breathing at rest. Cardiac: Regular rate and rhythm, no S3, 1/6 systolic murmur, no pericardial rub. Extremities: No pitting edema.  ECG:  An ECG dated 02/20/2020 was personally reviewed today and demonstrated:  Sinus rhythm with right bundle branch block.  Recent Labwork: 09/27/2020: ALT 23; AST 20; BUN 21; Creatinine, Ser 1.18; Hemoglobin 14.4; Platelets 226; Potassium 3.7; Sodium 136     Component Value Date/Time   CHOL 116 09/27/2020 1401   TRIG 102 09/27/2020 1401   HDL 31 (L) 09/27/2020 1401   CHOLHDL 3.7 09/27/2020 1401   VLDL 20 09/27/2020 1401   LDLCALC 65 09/27/2020 1401    Other Studies Reviewed Today:  Echocardiogram 01/01/2020:  1. Left ventricular ejection fraction, by estimation, is 60 to 65%. The  left ventricle has normal function. The left ventricle has no regional  wall motion abnormalities. There is mild left ventricular hypertrophy.  Left ventricular diastolic parameters  are consistent with Grade I diastolic dysfunction (impaired relaxation).   2. Right ventricular systolic function is normal. The right ventricular  size is normal.   3. Left atrial size was moderately dilated.   4. The mitral valve is normal in structure. Trivial mitral valve  regurgitation. No evidence of mitral stenosis.   5. The aortic valve is tricuspid. There is mild calcification of the  aortic valve. There is mild thickening of the aortic valve. Aortic valve  regurgitation is not visualized. No aortic stenosis is present.   6. The inferior vena cava is normal in size with greater than 50%  respiratory variability, suggesting right atrial pressure of 3 mmHg.  Assessment and Plan:  1.  Paroxysmal atrial fibrillation with  CHA2DS2-VASc score of 3.  He continues to do well with very low rhythm burden based on device interrogation.  Continue Eliquis for stroke prophylaxis, also on Lopressor.  I reviewed his most recent lab work.  He does not report any spontaneous bleeding problems.  2.  Essential hypertension, adding Cozaar 25 mg to current regimen, check BMET in 7 to 10 days.  3.  Multivessel CAD status post CABG with graft disease, SVG to OM occluded and managed medically.  He does not report any active angina at this time.  Continue Lopressor, Jardiance, and Lipitor.  He has as needed nitroglycerin available.  4.  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 Lovena Le.  Medication Adjustments/Labs and Tests Ordered: Current medicines are reviewed  at length with the patient today.  Concerns regarding medicines are outlined above.   Tests Ordered: Orders Placed This Encounter  Procedures   Basic metabolic panel    Medication Changes: No orders of the defined types were placed in this encounter.   Disposition:  Follow up  6 months.  Signed, Satira Sark, MD, [facility] 04/04/2021 3:39 PM    [place] at [facility] [address] [place], [place] [address] Phone: [phone]; Fax: [phone]

## 2021-04-04 NOTE — Addendum Note (Signed)
Addended by: Marlyn Corporal A on: 04/04/2021 04:50 PM   Modules accepted: Orders

## 2021-04-04 NOTE — Patient Instructions (Signed)
Medication Instructions:  START Cozaar 25 mg daily   Labwork: BMET in 7-10 days  Testing/Procedures: none  Follow-Up: 6 months  Any Other Special Instructions Will Be Listed Below (If Applicable).  If you need a refill on your cardiac medications before your next appointment, please call your pharmacy.

## 2021-04-11 ENCOUNTER — Ambulatory Visit (INDEPENDENT_AMBULATORY_CARE_PROVIDER_SITE_OTHER): Payer: Medicare Other

## 2021-04-11 DIAGNOSIS — I441 Atrioventricular block, second degree: Secondary | ICD-10-CM

## 2021-04-11 LAB — CUP PACEART REMOTE DEVICE CHECK
Battery Remaining Longevity: 143 mo
Battery Voltage: 3.05 V
Brady Statistic AP VP Percent: 7.77 %
Brady Statistic AP VS Percent: 0.3 %
Brady Statistic AS VP Percent: 84.59 %
Brady Statistic AS VS Percent: 7.33 %
Brady Statistic RA Percent Paced: 10.14 %
Brady Statistic RV Percent Paced: 92.38 %
Date Time Interrogation Session: 20230213054711
Implantable Lead Implant Date: 20211115
Implantable Lead Implant Date: 20211115
Implantable Lead Location: 753859
Implantable Lead Location: 753860
Implantable Lead Model: 5076
Implantable Lead Model: 7842
Implantable Lead Serial Number: 1057567
Implantable Pulse Generator Implant Date: 20211115
Lead Channel Impedance Value: 285 Ohm
Lead Channel Impedance Value: 494 Ohm
Lead Channel Impedance Value: 551 Ohm
Lead Channel Impedance Value: 703 Ohm
Lead Channel Pacing Threshold Amplitude: 0.625 V
Lead Channel Pacing Threshold Amplitude: 1 V
Lead Channel Pacing Threshold Pulse Width: 0.4 ms
Lead Channel Pacing Threshold Pulse Width: 0.4 ms
Lead Channel Sensing Intrinsic Amplitude: 1.875 mV
Lead Channel Sensing Intrinsic Amplitude: 1.875 mV
Lead Channel Sensing Intrinsic Amplitude: 20.125 mV
Lead Channel Sensing Intrinsic Amplitude: 20.125 mV
Lead Channel Setting Pacing Amplitude: 2 V
Lead Channel Setting Pacing Amplitude: 2.5 V
Lead Channel Setting Pacing Pulse Width: 0.4 ms
Lead Channel Setting Sensing Sensitivity: 1.2 mV

## 2021-04-13 NOTE — Progress Notes (Signed)
Remote pacemaker transmission.   

## 2021-05-27 ENCOUNTER — Other Ambulatory Visit: Payer: Self-pay | Admitting: Cardiology

## 2021-07-11 ENCOUNTER — Ambulatory Visit (INDEPENDENT_AMBULATORY_CARE_PROVIDER_SITE_OTHER): Payer: Medicare Other

## 2021-07-11 DIAGNOSIS — I442 Atrioventricular block, complete: Secondary | ICD-10-CM

## 2021-07-12 LAB — CUP PACEART REMOTE DEVICE CHECK
Battery Remaining Longevity: 139 mo
Battery Voltage: 3.03 V
Brady Statistic AP VP Percent: 12.71 %
Brady Statistic AP VS Percent: 0.39 %
Brady Statistic AS VP Percent: 81.5 %
Brady Statistic AS VS Percent: 5.39 %
Brady Statistic RA Percent Paced: 15.07 %
Brady Statistic RV Percent Paced: 94.09 %
Date Time Interrogation Session: 20230514230047
Implantable Lead Implant Date: 20211115
Implantable Lead Implant Date: 20211115
Implantable Lead Location: 753859
Implantable Lead Location: 753860
Implantable Lead Model: 5076
Implantable Lead Model: 7842
Implantable Lead Serial Number: 1057567
Implantable Pulse Generator Implant Date: 20211115
Lead Channel Impedance Value: 285 Ohm
Lead Channel Impedance Value: 475 Ohm
Lead Channel Impedance Value: 475 Ohm
Lead Channel Impedance Value: 703 Ohm
Lead Channel Pacing Threshold Amplitude: 0.625 V
Lead Channel Pacing Threshold Amplitude: 0.875 V
Lead Channel Pacing Threshold Pulse Width: 0.4 ms
Lead Channel Pacing Threshold Pulse Width: 0.4 ms
Lead Channel Sensing Intrinsic Amplitude: 1.75 mV
Lead Channel Sensing Intrinsic Amplitude: 1.75 mV
Lead Channel Sensing Intrinsic Amplitude: 16.875 mV
Lead Channel Sensing Intrinsic Amplitude: 16.875 mV
Lead Channel Setting Pacing Amplitude: 2 V
Lead Channel Setting Pacing Amplitude: 2.5 V
Lead Channel Setting Pacing Pulse Width: 0.4 ms
Lead Channel Setting Sensing Sensitivity: 1.2 mV

## 2021-07-28 NOTE — Progress Notes (Signed)
Remote pacemaker transmission.   

## 2021-08-24 ENCOUNTER — Ambulatory Visit (INDEPENDENT_AMBULATORY_CARE_PROVIDER_SITE_OTHER): Payer: Medicare Other | Admitting: Internal Medicine

## 2021-08-24 ENCOUNTER — Other Ambulatory Visit: Payer: Self-pay | Admitting: Cardiology

## 2021-08-24 ENCOUNTER — Encounter: Payer: Self-pay | Admitting: Internal Medicine

## 2021-08-24 VITALS — BP 150/82 | HR 77 | Ht 71.0 in | Wt 214.6 lb

## 2021-08-24 DIAGNOSIS — I25119 Atherosclerotic heart disease of native coronary artery with unspecified angina pectoris: Secondary | ICD-10-CM | POA: Diagnosis not present

## 2021-08-24 DIAGNOSIS — Z79899 Other long term (current) drug therapy: Secondary | ICD-10-CM | POA: Diagnosis not present

## 2021-08-24 MED ORDER — FUROSEMIDE 40 MG PO TABS: 40.0000 mg | ORAL_TABLET | Freq: Every day | ORAL | 3 refills | Status: DC

## 2021-08-24 NOTE — Patient Instructions (Signed)
Medication Instructions:  Stop Taking Chlorthalidone  Start Taking Lasix 40 mg Daily   *If you need a refill on your cardiac medications before your next appointment, please call your pharmacy*   Lab Work: Your physician recommends that you return for lab work in: 2 Weeks ( 09/07/21)   If you have labs (blood work) drawn today and your tests are completely normal, you will receive your results only by: MyChart Message (if you have MyChart) OR A paper copy in the mail If you have any lab test that is abnormal or we need to change your treatment, we will call you to review the results.   Testing/Procedures: NONE    Follow-Up: At [facility], you and your health needs are our priority.  As part of our continuing mission to provide you with exceptional heart care, we have created designated Provider Care Teams.  These Care Teams include your primary Cardiologist (physician) and Advanced Practice Providers (APPs -  Physician Assistants and Nurse Practitioners) who all work together to provide you with the care you need, when you need it.  We recommend signing up for the patient portal called "MyChart".  Sign up information is provided on this After Visit Summary.  MyChart is used to connect with patients for Virtual Visits (Telemedicine).  Patients are able to view lab/test results, encounter notes, upcoming appointments, etc.  Non-urgent messages can be sent to your provider as well.   To learn more about what you can do with MyChart, go to [email or web address].    Your next appointment:   1 year(s)  The format for your next appointment:   In Person  Provider:   Lewayne Bunting, MD    Other Instructions Thank you for choosing [facility]!    Important Information About Sugar

## 2021-08-24 NOTE — Progress Notes (Signed)
HPI William Hatfield returns today for followup. 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 He has PAF. He had several episodes back in December which coincided with Covid 19. He denies chest pain or sob. No syncope. No palpitations. He lost his wife to Covid in January a year and a half ago. He notes some peripheral edema and sob.  Allergies  Allergen Reactions   Niaspan [Niacin] Hives and Itching    NIASPAN ONLY     Current Outpatient Medications  Medication Sig Dispense Refill   acetaminophen (TYLENOL) 500 MG tablet Take 1,000 mg by mouth every 6 (six) hours as needed for moderate pain or headache.     allopurinol (ZYLOPRIM) 100 MG tablet Take 100 mg by mouth daily.     apixaban (ELIQUIS) 5 MG TABS tablet Take 1 tablet (5 mg total) by mouth 2 (two) times daily. 60 tablet 6   atorvastatin (LIPITOR) 40 MG tablet TAKE 1 TABLET EVERY EVENING 90 tablet 1   chlorthalidone (HYGROTON) 25 MG tablet TAKE 1 TABLET EVERY DAY 90 tablet 1   Coenzyme Q10 (COQ-10) 100 MG CAPS Take 100 mg by mouth daily.     cyanocobalamin 500 MCG tablet Take 500 mcg by mouth daily.     empagliflozin (JARDIANCE) 10 MG TABS tablet Take 1 tablet (10 mg total) by mouth daily before breakfast. 30 tablet 6   fluticasone (FLONASE) 50 MCG/ACT nasal spray Place 1 spray into both nostrils daily as needed for allergies or rhinitis.     losartan (COZAAR) 25 MG tablet Take 1 tablet (25 mg total) by mouth daily. 90 tablet 3   metoprolol tartrate (LOPRESSOR) 25 MG tablet TAKE 1 TABLET TWICE DAILY 180 tablet 3   Multiple Vitamin (MULTIVITAMIN) tablet Take 1 tablet by mouth daily.     nitroGLYCERIN (NITROSTAT) 0.4 MG SL tablet Place 1 tablet (0.4 mg total) under the tongue every 5 (five) minutes x 3 doses as needed for chest pain. 25 tablet 0   No current facility-administered medications for this visit.     Past Medical History:  Diagnosis Date   Atrial fibrillation (HCC)    a. post op in 2006  b.  recurrence on 08/2016 admission for NSTEMI   CAD (coronary artery disease)    a. 2006: s/p CABG at [facility] with LIMA to LAD, SVG to PDA, SVG to OM1, SVG to D1  b. 08/2016: NSTEMI 09/18/16 which showed severe native 3V CAD with patent SVG-->PLA, LIMA-->LAD and total occlusion of SVG--> OM1 with unsuccessful attempt at PCI of SVG--> OM with inability to restore flow.    Carotid artery disease (HCC)    CKD (chronic kidney disease)    Essential hypertension    Tremor     ROS:   All systems reviewed and negative except as noted in the HPI.   Past Surgical History:  Procedure Laterality Date   CORONARY ARTERY BYPASS GRAFT  2006   [facility]   CORONARY BALLOON ANGIOPLASTY N/A 09/18/2016   Procedure: Coronary Balloon Angioplasty;  Surgeon: Tonny Bollman, MD;  Location: [facility] INVASIVE CV LAB;  Service: Cardiovascular;  Laterality: N/A;   LEFT HEART CATH AND CORS/GRAFTS ANGIOGRAPHY N/A 09/18/2016   Procedure: Left Heart Cath and Cors/Grafts Angiography;  Surgeon: Tonny Bollman, MD;  Location: [facility] INVASIVE CV LAB;  Service: Cardiovascular;  Laterality: N/A;   PACEMAKER IMPLANT N/A 01/12/2020   Procedure: PACEMAKER IMPLANT;  Surgeon: Marinus Maw, MD;  Location: [facility] INVASIVE  CV LAB;  Service: Cardiovascular;  Laterality: N/A;     Family History  Problem Relation Age of Onset   Heart attack Father    Heart attack Paternal Grandfather      Social History   Socioeconomic History   Marital status: Widowed    Spouse name: Not on file   Number of children: Not on file   Years of education: Not on file   Highest education level: Not on file  Occupational History   Not on file  Tobacco Use   Smoking status: Former    Packs/day: 4.00    Years: 20.00    Total pack years: 80.00    Types: Cigarettes    Quit date: 02/28/1979    Years since quitting: 42.5   Smokeless tobacco: Former    Types: Snuff, Chew    Quit date: 02/27/1981  Vaping Use   Vaping Use: Never used  Substance and Sexual Activity    Alcohol use: No    Alcohol/week: 0.0 standard drinks of alcohol   Drug use: No   Sexual activity: Not on file  Other Topics Concern   Not on file  Social History Narrative   Not on file   Social Determinants of Health   Financial Resource Strain: Not on file  Food Insecurity: Not on file  Transportation Needs: Not on file  Physical Activity: Not on file  Stress: Not on file  Social Connections: Not on file  Intimate Partner Violence: Not on file     BP (!) 150/82   Pulse 77   Ht 5\' 11"  (1.803 m)   Wt 214 lb 9.6 oz (97.3 kg)   SpO2 98%   BMI 29.93 kg/m   Physical Exam:  Well appearing NAD HEENT: Unremarkable Neck:  No JVD, no thyromegally Lymphatics:  No adenopathy Back:  No CVA tenderness Lungs:  Clear with no wheezes HEART:  Regular rate rhythm, no murmurs, no rubs, no clicks Abd:  soft, positive bowel sounds, no organomegally, no rebound, no guarding Ext:  2 plus pulses, no edema, no cyanosis, no clubbing Skin:  No rashes no nodules Neuro:  CN II through XII intact, motor grossly intact  DEVICE  Normal device function.  See PaceArt for details.   Assess/Plan:  1. CHB - he is asymptomatic s/p PPM insertion. 2. PPM - his medtronic DDD PM is working normally. 3. PAF - he is maintaining NSR since his Covid infection. He will continue eliquis. 4. CAD - he remains active and denies anginal symptoms.  5. Volume overload - I asked him to stop chlorthalidone and start lasix 40 mg daily. We will recheck labs in 2 weeks.    William Prosser,MD

## 2021-09-21 ENCOUNTER — Encounter: Payer: Self-pay | Admitting: Cardiology

## 2021-09-21 ENCOUNTER — Ambulatory Visit (INDEPENDENT_AMBULATORY_CARE_PROVIDER_SITE_OTHER): Payer: Medicare Other | Admitting: Cardiology

## 2021-09-21 ENCOUNTER — Other Ambulatory Visit (HOSPITAL_COMMUNITY)
Admission: RE | Admit: 2021-09-21 | Discharge: 2021-09-21 | Disposition: A | Discharge status: Home / Self Care | Payer: Medicare Other | Source: Ambulatory Visit | Attending: Cardiology | Admitting: Cardiology

## 2021-09-21 VITALS — BP 150/82 | HR 70 | Ht 71.0 in | Wt 215.0 lb

## 2021-09-21 DIAGNOSIS — I480 Paroxysmal atrial fibrillation: Secondary | ICD-10-CM | POA: Insufficient documentation

## 2021-09-21 DIAGNOSIS — E782 Mixed hyperlipidemia: Secondary | ICD-10-CM

## 2021-09-21 DIAGNOSIS — I10 Essential (primary) hypertension: Secondary | ICD-10-CM

## 2021-09-21 DIAGNOSIS — I25119 Atherosclerotic heart disease of native coronary artery with unspecified angina pectoris: Secondary | ICD-10-CM | POA: Diagnosis not present

## 2021-09-21 LAB — CBC
HCT: 39.8 % (ref 39.0–52.0)
Hemoglobin: 13.2 g/dL (ref 13.0–17.0)
MCH: 29.9 pg (ref 26.0–34.0)
MCHC: 33.2 g/dL (ref 30.0–36.0)
MCV: 90.2 fL (ref 80.0–100.0)
Platelets: 217 10*3/uL (ref 150–400)
RBC: 4.41 MIL/uL (ref 4.22–5.81)
RDW: 13.2 % (ref 11.5–15.5)
WBC: 9.4 10*3/uL (ref 4.0–10.5)
nRBC: 0 % (ref 0.0–0.2)

## 2021-09-21 LAB — BASIC METABOLIC PANEL
Anion gap: 7 (ref 5–15)
BUN: 23 mg/dL (ref 8–23)
CO2: 28 mmol/L (ref 22–32)
Calcium: 8.9 mg/dL (ref 8.9–10.3)
Chloride: 105 mmol/L (ref 98–111)
Creatinine, Ser: 1.32 mg/dL — ABNORMAL HIGH (ref 0.61–1.24)
GFR, Estimated: 57 mL/min — ABNORMAL LOW (ref >60)
Glucose, Bld: 92 mg/dL (ref 70–99)
Potassium: 4.1 mmol/L (ref 3.5–5.1)
Sodium: 140 mmol/L (ref 135–145)

## 2021-09-21 LAB — LIPID PANEL
Cholesterol: 102 mg/dL (ref 0–200)
HDL: 25 mg/dL — ABNORMAL LOW (ref >40)
LDL Cholesterol: 57 mg/dL (ref 0–99)
Total CHOL/HDL Ratio: 4.1 RATIO
Triglycerides: 98 mg/dL (ref <150)
VLDL: 20 mg/dL (ref 0–40)

## 2021-09-21 MED ORDER — LOSARTAN POTASSIUM 50 MG PO TABS: 50.0000 mg | ORAL_TABLET | Freq: Every day | ORAL | 3 refills | Status: DC

## 2021-09-21 NOTE — Progress Notes (Signed)
Cardiology Office Note  Date: 09/21/2021   ID: William Hatfield, DOB [birth year], MRN [id number]  PCP:  Pcp, No  Cardiologist:  Nona Dell, MD Electrophysiologist:  Lewayne Bunting, MD   Chief Complaint  Patient presents with   Cardiac follow-up    History of Present Illness: William Hatfield is a 76 y.o. male last seen in February.  He is here for a follow-up visit.  Reports no progressive angina symptoms or increasing nitroglycerin use.  Still working full-time.  Reports NYHA class II dyspnea.  Some trouble with sinus congestion.  No cough.  I reviewed his medications which are outlined below.  He did tolerate the addition of Cozaar, we will plan to increase the dose to 50 mg daily for better blood pressure control.  Also will arrange follow-up lab work on [employer] and Lipitor.  Medtronic pacemaker in place with follow-up by Dr. Ladona Ridgel.  Last device interrogation showed normal function with AF burden less than 1%.  Past Medical History:  Diagnosis Date   Atrial fibrillation (HCC)    a. post op in 2006  b. recurrence on 08/2016 admission for NSTEMI   CAD (coronary artery disease)    a. 2006: s/p CABG at [facility] with LIMA to LAD, SVG to PDA, SVG to OM1, SVG to D1  b. 08/2016: NSTEMI 09/18/16 which showed severe native 3V CAD with patent SVG-->PLA, LIMA-->LAD and total occlusion of SVG--> OM1 with unsuccessful attempt at PCI of SVG--> OM with inability to restore flow.    Carotid artery disease (HCC)    CKD (chronic kidney disease)    Essential hypertension    Tremor     Past Surgical History:  Procedure Laterality Date   CORONARY ARTERY BYPASS GRAFT  2006   [facility]   CORONARY BALLOON ANGIOPLASTY N/A 09/18/2016   Procedure: Coronary Balloon Angioplasty;  Surgeon: Tonny Bollman, MD;  Location: [facility]VASIVE CV LAB;  Service: Cardiovascular;  Laterality: N/A;   LEFT HEART CATH AND CORS/GRAFTS ANGIOGRAPHY N/A 09/18/2016   Procedure: Left Heart Cath and Cors/Grafts Angiography;  Surgeon: Tonny Bollman, MD;  Location: [facility] INVASIVE CV LAB;  Service: Cardiovascular;  Laterality: N/A;   PACEMAKER IMPLANT N/A 01/12/2020   Procedure: PACEMAKER IMPLANT;  Surgeon: Marinus Maw, MD;  Location: [facility] INVASIVE CV LAB;  Service: Cardiovascular;  Laterality: N/A;    Current Outpatient Medications  Medication Sig Dispense Refill   acetaminophen (TYLENOL) 500 MG tablet Take 1,000 mg by mouth every 6 (six) hours as needed for moderate pain or headache.     allopurinol (ZYLOPRIM) 100 MG tablet Take 100 mg by mouth daily.     apixaban (ELIQUIS) 5 MG TABS tablet Take 1 tablet (5 mg total) by mouth 2 (two) times daily. 60 tablet 6   atorvastatin (LIPITOR) 40 MG tablet TAKE 1 TABLET EVERY EVENING 90 tablet 1   Coenzyme Q10 (COQ-10) 100 MG CAPS Take 100 mg by mouth daily.     cyanocobalamin 500 MCG tablet Take 500 mcg by mouth daily.     empagliflozin (JARDIANCE) 10 MG TABS tablet Take 1 tablet (10 mg total) by mouth daily before breakfast. 30 tablet 6   fluticasone (FLONASE) 50 MCG/ACT nasal spray Place 1 spray into both nostrils daily as needed for allergies or rhinitis.     furosemide (LASIX) 40 MG tablet Take 1 tablet (40 mg total) by mouth daily. 90 tablet 3   metoprolol tartrate (LOPRESSOR) 25 MG tablet TAKE 1 TABLET TWICE DAILY 180 tablet 3   Multiple Vitamin (  MULTIVITAMIN) tablet Take 1 tablet by mouth daily.     nitroGLYCERIN (NITROSTAT) 0.4 MG SL tablet Place 1 tablet (0.4 mg total) under the tongue every 5 (five) minutes x 3 doses as needed for chest pain. 25 tablet 0   losartan (COZAAR) 50 MG tablet Take 1 tablet (50 mg total) by mouth daily. 90 tablet 3   No current facility-administered medications for this visit.   Allergies:  Niaspan [niacin]   ROS: No palpitations or syncope.  Physical Exam: VS:  BP (!) 150/82   Pulse 70   Ht 5\' 11"  (1.803 m)   Wt 215 lb (97.5 kg)   SpO2 97%   BMI 29.99 kg/m , BMI Body mass index is 29.99 kg/m.  Wt Readings from Last 3 Encounters:  09/21/21  215 lb (97.5 kg)  08/24/21 214 lb 9.6 oz (97.3 kg)  04/04/21 209 lb 9.6 oz (95.1 kg)    General: Patient appears comfortable at rest. HEENT: Conjunctiva and lids normal, oropharynx clear. Neck: Supple, no elevated JVP or carotid bruits, no thyromegaly. Lungs: Clear to auscultation, nonlabored breathing at rest. Cardiac: Regular rate and rhythm, no S3, 1/6 systolic murmur. Extremities: No pitting edema.  ECG:  An ECG dated 04/04/2021 was personally reviewed today and demonstrated:  Ventricular pacing.  Recent Labwork: 09/27/2020: ALT 23; AST 20; BUN 21; Creatinine, Ser 1.18; Hemoglobin 14.4; Platelets 226; Potassium 3.7; Sodium 136     Component Value Date/Time   CHOL 116 09/27/2020 1401   TRIG 102 09/27/2020 1401   HDL 31 (L) 09/27/2020 1401   CHOLHDL 3.7 09/27/2020 1401   VLDL 20 09/27/2020 1401   LDLCALC 65 09/27/2020 1401    Other Studies Reviewed Today:  Echocardiogram 01/01/2020:  1. Left ventricular ejection fraction, by estimation, is 60 to 65%. The  left ventricle has normal function. The left ventricle has no regional  wall motion abnormalities. There is mild left ventricular hypertrophy.  Left ventricular diastolic parameters  are consistent with Grade I diastolic dysfunction (impaired relaxation).   2. Right ventricular systolic function is normal. The right ventricular  size is normal.   3. Left atrial size was moderately dilated.   4. The mitral valve is normal in structure. Trivial mitral valve  regurgitation. No evidence of mitral stenosis.   5. The aortic valve is tricuspid. There is mild calcification of the  aortic valve. There is mild thickening of the aortic valve. Aortic valve  regurgitation is not visualized. No aortic stenosis is present.   6. The inferior vena cava is normal in size with greater than 50%  respiratory variability, suggesting right atrial pressure of 3 mmHg.  Assessment and Plan:  1.  Multivessel CAD status post CABG with graft disease,  specifically SVG to OM occlusion and plan to continue medical therapy in the absence of accelerating angina.  Continue Lopressor, Cozaar, Jardiance, Lipitor, and as needed nitroglycerin.  2.  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  Check FLP.  Last LDL was 65.  3.  Paroxysmal atrial fibrillation with CHA2DS2-VASc score of 3.  He remains on Eliquis for stroke prophylaxis along with Lopressor.  No progressive palpitations.  Check CBC and BMET.  4.  Essential hypertension, increase Cozaar to 50 mg daily for better blood pressure control.  Medication Adjustments/Labs and Tests Ordered: Current medicines are reviewed at length with the patient today.  Concerns regarding medicines are outlined above.   Tests Ordered: Orders Placed This Encounter  Procedures   CBC   Basic metabolic panel  Lipid Profile    Medication Changes: Meds ordered this encounter  Medications   DISCONTD: losartan (COZAAR) 50 MG tablet    Sig: Take 1 tablet (50 mg total) by mouth daily.    Dispense:  90 tablet    Refill:  3    09/21/21 dose increased to 50 mg qd   losartan (COZAAR) 50 MG tablet    Sig: Take 1 tablet (50 mg total) by mouth daily.    Dispense:  90 tablet    Refill:  3    09/21/21 dose increased to 50 mg qd    Disposition:  Follow up  6 months.  Signed, Satira Sark, MD, [facility] 09/21/2021 2:19 PM    [facility] at [facility] [address] [place], [place] [address] Phone: [phone]; Fax: [phone]

## 2021-09-21 NOTE — Patient Instructions (Signed)
Medication Instructions:  INCREASE losartan to 50 mg daily  Labwork: Fasting Lipids, cbc,bmet  Testing/Procedures: None today  Follow-Up: 6 months  Any Other Special Instructions Will Be Listed Below (If Applicable).  If you need a refill on your cardiac medications before your next appointment, please call your pharmacy.

## 2021-10-06 ENCOUNTER — Telehealth: Payer: Self-pay | Admitting: Cardiology

## 2021-10-06 MED ORDER — NITROGLYCERIN 0.4 MG SL SUBL: 0.4000 mg | SUBLINGUAL_TABLET | SUBLINGUAL | 3 refills | Status: DC | PRN

## 2021-10-06 NOTE — Telephone Encounter (Signed)
*  STAT* If patient is at the pharmacy, call can be transferred to refill team.   1. Which medications need to be refilled? (please list name of each medication and dose if known)  nitroGLYCERIN (NITROSTAT) 0.4 MG SL tablet 2. Which pharmacy/location (including street and city if local pharmacy) is medication to be sent to? [id number] - [place], [place] - [address]  3. Do they need a 30 day or 90 day supply? 90

## 2021-10-06 NOTE — Telephone Encounter (Signed)
Completed.

## 2021-10-10 ENCOUNTER — Ambulatory Visit: Payer: Medicare Other

## 2021-10-10 LAB — CUP PACEART REMOTE DEVICE CHECK
Battery Remaining Longevity: 134 mo
Battery Voltage: 3.02 V
Brady Statistic AP VP Percent: 10.56 %
Brady Statistic AP VS Percent: 0.21 %
Brady Statistic AS VP Percent: 85.12 %
Brady Statistic AS VS Percent: 4.1 %
Brady Statistic RA Percent Paced: 12.87 %
Brady Statistic RV Percent Paced: 95.4 %
Date Time Interrogation Session: 20230813193354
Implantable Lead Implant Date: 20211115
Implantable Lead Implant Date: 20211115
Implantable Lead Location: 753859
Implantable Lead Location: 753860
Implantable Lead Model: 5076
Implantable Lead Model: 7842
Implantable Lead Serial Number: 1057567
Implantable Pulse Generator Implant Date: 20211115
Lead Channel Impedance Value: 285 Ohm
Lead Channel Impedance Value: 475 Ohm
Lead Channel Impedance Value: 532 Ohm
Lead Channel Impedance Value: 684 Ohm
Lead Channel Pacing Threshold Amplitude: 0.75 V
Lead Channel Pacing Threshold Amplitude: 1.125 V
Lead Channel Pacing Threshold Pulse Width: 0.4 ms
Lead Channel Pacing Threshold Pulse Width: 0.4 ms
Lead Channel Sensing Intrinsic Amplitude: 1.5 mV
Lead Channel Sensing Intrinsic Amplitude: 1.5 mV
Lead Channel Sensing Intrinsic Amplitude: 16.75 mV
Lead Channel Sensing Intrinsic Amplitude: 16.75 mV
Lead Channel Setting Pacing Amplitude: 2.25 V
Lead Channel Setting Pacing Amplitude: 2.5 V
Lead Channel Setting Pacing Pulse Width: 0.4 ms
Lead Channel Setting Sensing Sensitivity: 1.2 mV

## 2021-10-18 ENCOUNTER — Telehealth: Payer: Self-pay | Admitting: Cardiology

## 2021-10-18 NOTE — Telephone Encounter (Signed)
Given Lasix 40 mg by Dr.Taylor 07/2021 for peripheral edema. Patient states it helped until now. He is noting swelling again in his legs. His weigh remains between 212-215 lbs  He does admit to adding salt to his tomatoes. We discussed avoiding salt and use try alternatives like Mrs.Dash.   I will forward to Dr.Taylor for review.

## 2021-10-18 NOTE — Telephone Encounter (Signed)
Pt c/o medication issue:  1. Name of Medication:   furosemide (LASIX) 40 MG tablet    2. How are you currently taking this medication (dosage and times per day)? Take 1 tablet (40 mg total) by mouth daily.  3. Are you having a reaction (difficulty breathing--STAT)? No  4. What is your medication issue? Pt states that he does not this that current dosage of medication is working. Pt would like to know if dosage can be upped. Please advise

## 2021-10-20 NOTE — Telephone Encounter (Signed)
Patient notified and verbalized understanding. Pt had no further questions at this time.

## 2021-10-20 NOTE — Telephone Encounter (Signed)
Per Dr. Ladona Ridgel (in office): No change for now- keep legs elevated and avoid salt.

## 2022-01-06 ENCOUNTER — Other Ambulatory Visit: Payer: Self-pay | Admitting: Cardiology

## 2022-01-09 ENCOUNTER — Ambulatory Visit (INDEPENDENT_AMBULATORY_CARE_PROVIDER_SITE_OTHER): Payer: Medicare Other

## 2022-01-09 DIAGNOSIS — I442 Atrioventricular block, complete: Secondary | ICD-10-CM

## 2022-01-10 LAB — CUP PACEART REMOTE DEVICE CHECK
Battery Remaining Longevity: 131 mo
Battery Voltage: 3.01 V
Brady Statistic AP VP Percent: 7.93 %
Brady Statistic AP VS Percent: 0 %
Brady Statistic AS VP Percent: 90.14 %
Brady Statistic AS VS Percent: 1.92 %
Brady Statistic RA Percent Paced: 9.15 %
Brady Statistic RV Percent Paced: 98.08 %
Date Time Interrogation Session: 20231113052346
Implantable Lead Connection Status: 753985
Implantable Lead Connection Status: 753985
Implantable Lead Implant Date: 20211115
Implantable Lead Implant Date: 20211115
Implantable Lead Location: 753859
Implantable Lead Location: 753860
Implantable Lead Model: 5076
Implantable Lead Model: 7842
Implantable Lead Serial Number: 1057567
Implantable Pulse Generator Implant Date: 20211115
Lead Channel Impedance Value: 285 Ohm
Lead Channel Impedance Value: 475 Ohm
Lead Channel Impedance Value: 551 Ohm
Lead Channel Impedance Value: 684 Ohm
Lead Channel Pacing Threshold Amplitude: 0.625 V
Lead Channel Pacing Threshold Amplitude: 1 V
Lead Channel Pacing Threshold Pulse Width: 0.4 ms
Lead Channel Pacing Threshold Pulse Width: 0.4 ms
Lead Channel Sensing Intrinsic Amplitude: 1.75 mV
Lead Channel Sensing Intrinsic Amplitude: 1.75 mV
Lead Channel Sensing Intrinsic Amplitude: 16 mV
Lead Channel Sensing Intrinsic Amplitude: 16 mV
Lead Channel Setting Pacing Amplitude: 2 V
Lead Channel Setting Pacing Amplitude: 2.5 V
Lead Channel Setting Pacing Pulse Width: 0.4 ms
Lead Channel Setting Sensing Sensitivity: 1.2 mV
Zone Setting Status: 755011

## 2022-02-16 NOTE — Progress Notes (Signed)
Remote pacemaker transmission.   

## 2022-03-27 ENCOUNTER — Encounter: Payer: Self-pay | Admitting: Cardiology

## 2022-03-27 ENCOUNTER — Ambulatory Visit: Payer: Medicare Other | Attending: Cardiology | Admitting: Cardiology

## 2022-03-27 VITALS — BP 144/90 | HR 85 | Ht 71.0 in | Wt 211.3 lb

## 2022-03-27 DIAGNOSIS — E785 Hyperlipidemia, unspecified: Secondary | ICD-10-CM | POA: Diagnosis present

## 2022-03-27 DIAGNOSIS — I6523 Occlusion and stenosis of bilateral carotid arteries: Secondary | ICD-10-CM

## 2022-03-27 DIAGNOSIS — I25119 Atherosclerotic heart disease of native coronary artery with unspecified angina pectoris: Secondary | ICD-10-CM | POA: Diagnosis present

## 2022-03-27 DIAGNOSIS — I779 Disorder of arteries and arterioles, unspecified: Secondary | ICD-10-CM | POA: Insufficient documentation

## 2022-03-27 DIAGNOSIS — I480 Paroxysmal atrial fibrillation: Secondary | ICD-10-CM

## 2022-03-27 DIAGNOSIS — E782 Mixed hyperlipidemia: Secondary | ICD-10-CM

## 2022-03-27 MED ORDER — NITROGLYCERIN 0.4 MG SL SUBL: 0.4000 mg | SUBLINGUAL_TABLET | SUBLINGUAL | 3 refills | Status: DC | PRN

## 2022-03-27 NOTE — Patient Instructions (Signed)
Medication Instructions:  Your physician recommends that you continue on your current medications as directed. Please refer to the Current Medication list given to you today.   Labwork: IN 6 MONTHS: CBC,BMET,Lipids  Testing/Procedures: Your physician has requested that you have a carotid duplex. This test is an ultrasound of the carotid arteries in your neck. It looks at blood flow through these arteries that supply the brain with blood. Allow one hour for this exam. There are no restrictions or special instructions.   Follow-Up: 6 months  Any Other Special Instructions Will Be Listed Below (If Applicable).  If you need a refill on your cardiac medications before your next appointment, please call your pharmacy.

## 2022-03-27 NOTE — Progress Notes (Signed)
Cardiology Office Note  Date: 03/27/2022   ID: William Hatfield, DOB June 18, 1945, MRN [id number]  PCP:  Pcp, No  Cardiologist:  Rozann Lesches, MD Electrophysiologist:  Cristopher Peru, MD   Chief Complaint  Patient presents with   Cardiac follow-up    History of Present Illness: William Hatfield is a 77 y.o. male last seen in July 2023.  He is here for a follow-up visit.  Reports no angina or increasing nitroglycerin use with typical activities.  He reports compliance with his medications and no obvious intolerances.  Lab work around the time of his last visit as noted below, LDL was 57 at that point.  [employer] pacemaker in place with follow-up by Dr. Lovena Le.  Most recent device interrogation showed very low AF burden.  He does not report progressive sense of palpitations, no dizziness or syncope.  I personally reviewed his ECG today which shows a ventricular paced rhythm with atrial tracking and sinus rhythm.  Carotid Dopplers from 2021 indicated moderate bilateral ICA stenosis, we will plan on getting a follow-up study.  Past Medical History:  Diagnosis Date   Atrial fibrillation ([place])    a. post op in 2006  b. recurrence on 08/2016 admission for NSTEMI   CAD (coronary artery disease)    a. 2006: s/p CABG at [facility] with LIMA to LAD, SVG to PDA, SVG to OM1, SVG to D1  b. 08/2016: NSTEMI 09/18/16 which showed severe native 3V CAD with patent SVG-->PLA, LIMA-->LAD and total occlusion of SVG--> OM1 with unsuccessful attempt at PCI of SVG--> OM with inability to restore flow.    Carotid artery disease (HCC)    CKD (chronic kidney disease)    Essential hypertension    Tremor     Current Outpatient Medications  Medication Sig Dispense Refill   acetaminophen (TYLENOL) 500 MG tablet Take 1,000 mg by mouth every 6 (six) hours as needed for moderate pain or headache.     allopurinol (ZYLOPRIM) 100 MG tablet Take 100 mg by mouth daily.     apixaban (ELIQUIS) 5 MG TABS tablet Take 1 tablet (5 mg total)  by mouth 2 (two) times daily. 60 tablet 6   atorvastatin (LIPITOR) 40 MG tablet TAKE 1 TABLET EVERY EVENING 90 tablet 3   Coenzyme Q10 (COQ-10) 100 MG CAPS Take 100 mg by mouth daily.     cyanocobalamin 500 MCG tablet Take 500 mcg by mouth daily.     empagliflozin (JARDIANCE) 10 MG TABS tablet Take 1 tablet (10 mg total) by mouth daily before breakfast. 30 tablet 6   fluticasone (FLONASE) 50 MCG/ACT nasal spray Place 1 spray into both nostrils daily as needed for allergies or rhinitis.     furosemide (LASIX) 40 MG tablet Take 1 tablet (40 mg total) by mouth daily. 90 tablet 3   losartan (COZAAR) 50 MG tablet Take 1 tablet (50 mg total) by mouth daily. 90 tablet 3   metoprolol tartrate (LOPRESSOR) 25 MG tablet TAKE 1 TABLET TWICE DAILY 180 tablet 3   Multiple Vitamin (MULTIVITAMIN) tablet Take 1 tablet by mouth daily.     nitroGLYCERIN (NITROSTAT) 0.4 MG SL tablet Place 1 tablet (0.4 mg total) under the tongue every 5 (five) minutes x 3 doses as needed for chest pain. 25 tablet 3   No current facility-administered medications for this visit.   Allergies:  Niaspan [niacin]   ROS: No orthopnea or PND.  Physical Exam: VS:  BP (!) 144/90   Pulse 85   Ht 5\' 11"  (  1.803 m)   Wt 211 lb 4.8 oz (95.8 kg)   SpO2 99%   BMI 29.47 kg/m , BMI Body mass index is 29.47 kg/m.  Wt Readings from Last 3 Encounters:  03/27/22 211 lb 4.8 oz (95.8 kg)  09/21/21 215 lb (97.5 kg)  08/24/21 214 lb 9.6 oz (97.3 kg)    General: Patient appears comfortable at rest. HEENT: Conjunctiva and lids normal. Neck: Supple, no elevated JVP, left carotid bruit. Lungs: Clear to auscultation, nonlabored breathing at rest. Cardiac: Regular rate and rhythm, no S3 or significant systolic murmur. Abdomen: Soft, nontender, bowel sounds present. Extremities: No pitting edema.  ECG:  An ECG dated 04/04/2021 was personally reviewed today and demonstrated:  Ventricular pacing.  Recent Labwork: 09/21/2021: BUN 23; Creatinine,  Ser 1.32; Hemoglobin 13.2; Platelets 217; Potassium 4.1; Sodium 140     Component Value Date/Time   CHOL 102 09/21/2021 1454   TRIG 98 09/21/2021 1454   HDL 25 (L) 09/21/2021 1454   CHOLHDL 4.1 09/21/2021 1454   VLDL 20 09/21/2021 1454   LDLCALC 57 09/21/2021 1454    Other Studies Reviewed Today:  Carotid Dopplers 08/14/2019. Summary:  Right Carotid: Velocities in the right ICA are consistent with a 40-59%                 stenosis. The ECA appears >50% stenosed.   Left Carotid: Velocities in the left ICA are consistent with a 40-59%  stenosis.               The ECA appears >50% stenosed.   Vertebrals:  Bilateral vertebral arteries demonstrate antegrade flow.  Subclavians: Normal flow hemodynamics were seen in bilateral subclavian               arteries.   Echocardiogram 01/01/2020:  1. Left ventricular ejection fraction, by estimation, is 60 to 65%. The  left ventricle has normal function. The left ventricle has no regional  wall motion abnormalities. There is mild left ventricular hypertrophy.  Left ventricular diastolic parameters  are consistent with Grade I diastolic dysfunction (impaired relaxation).   2. Right ventricular systolic function is normal. The right ventricular  size is normal.   3. Left atrial size was moderately dilated.   4. The mitral valve is normal in structure. Trivial mitral valve  regurgitation. No evidence of mitral stenosis.   5. The aortic valve is tricuspid. There is mild calcification of the  aortic valve. There is mild thickening of the aortic valve. Aortic valve  regurgitation is not visualized. No aortic stenosis is present.   6. The inferior vena cava is normal in size with greater than 50%  respiratory variability, suggesting right atrial pressure of 3 mmHg.  Assessment and Plan:  1.  Multivessel CAD status post CABG with known graft disease, occluded SVG to OM managed medically.  ECG reviewed.  Continue Cozaar, Lopressor, Jardiance,  Lipitor, and as needed nitroglycerin.  2.  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  Check FLP for next visit.  3.  Paroxysmal atrial fibrillation with CHA2DS2-VASc score of 3.  Continue Eliquis for stroke prophylaxis.  Follow-up CBC and BMET for next visit.  4.  Bilateral carotid artery disease, follow-up on carotid Dopplers in comparison to prior study from 2021.  Left carotid bruit evident on examination.  5.  Essential hypertension, continue with current regimen.  Medication Adjustments/Labs and Tests Ordered: Current medicines are reviewed at length with the patient today.  Concerns regarding medicines are outlined above.   Tests Ordered: Orders  Placed This Encounter  Procedures   US Carotid Duplex Bilateral   CBC   Basic metabolic panel   Lipid Profile   EKG 12-Lead    Medication Changes: Meds ordered this encounter  Medications   nitroGLYCERIN (NITROSTAT) 0.4 MG SL tablet    Sig: Place 1 tablet (0.4 mg total) under the tongue every 5 (five) minutes x 3 doses as needed for chest pain.    Dispense:  25 tablet    Refill:  3    Disposition:  Follow up  6 months.  Signed, Satira Sark, MD, [facility] 03/27/2022 4:02 PM    [facility] [address] [place], [place] [address] Phone: [phone]; Fax: [phone]

## 2022-04-06 ENCOUNTER — Ambulatory Visit (HOSPITAL_COMMUNITY): Payer: Medicare Other

## 2022-04-10 ENCOUNTER — Ambulatory Visit: Payer: Medicare Other

## 2022-04-10 DIAGNOSIS — I442 Atrioventricular block, complete: Secondary | ICD-10-CM | POA: Diagnosis not present

## 2022-04-11 LAB — CUP PACEART REMOTE DEVICE CHECK
Battery Remaining Longevity: 126 mo
Battery Voltage: 3.01 V
Brady Statistic AP VP Percent: 9.42 %
Brady Statistic AP VS Percent: 0.01 %
Brady Statistic AS VP Percent: 88.49 %
Brady Statistic AS VS Percent: 2.08 %
Brady Statistic RA Percent Paced: 10.94 %
Brady Statistic RV Percent Paced: 97.91 %
Date Time Interrogation Session: 20240211200725
Implantable Lead Connection Status: 753985
Implantable Lead Connection Status: 753985
Implantable Lead Implant Date: 20211115
Implantable Lead Implant Date: 20211115
Implantable Lead Location: 753859
Implantable Lead Location: 753860
Implantable Lead Model: 5076
Implantable Lead Model: 7842
Implantable Lead Serial Number: 1057567
Implantable Pulse Generator Implant Date: 20211115
Lead Channel Impedance Value: 266 Ohm
Lead Channel Impedance Value: 456 Ohm
Lead Channel Impedance Value: 475 Ohm
Lead Channel Impedance Value: 665 Ohm
Lead Channel Pacing Threshold Amplitude: 0.625 V
Lead Channel Pacing Threshold Amplitude: 0.875 V
Lead Channel Pacing Threshold Pulse Width: 0.4 ms
Lead Channel Pacing Threshold Pulse Width: 0.4 ms
Lead Channel Sensing Intrinsic Amplitude: 1.25 mV
Lead Channel Sensing Intrinsic Amplitude: 1.25 mV
Lead Channel Sensing Intrinsic Amplitude: 18.375 mV
Lead Channel Sensing Intrinsic Amplitude: 18.375 mV
Lead Channel Setting Pacing Amplitude: 2 V
Lead Channel Setting Pacing Amplitude: 2.5 V
Lead Channel Setting Pacing Pulse Width: 0.4 ms
Lead Channel Setting Sensing Sensitivity: 1.2 mV
Zone Setting Status: 755011

## 2022-04-14 ENCOUNTER — Ambulatory Visit (HOSPITAL_COMMUNITY)
Admission: RE | Admit: 2022-04-14 | Discharge: 2022-04-14 | Disposition: A | Discharge status: Home / Self Care | Payer: Medicare Other | Source: Ambulatory Visit | Attending: Cardiology | Admitting: Cardiology

## 2022-04-14 ENCOUNTER — Telehealth: Payer: Self-pay

## 2022-04-14 DIAGNOSIS — I6523 Occlusion and stenosis of bilateral carotid arteries: Secondary | ICD-10-CM

## 2022-04-14 DIAGNOSIS — I779 Disorder of arteries and arterioles, unspecified: Secondary | ICD-10-CM | POA: Insufficient documentation

## 2022-04-14 NOTE — Telephone Encounter (Signed)
Results discussed with patient. Urgent referral placed to [facility], patient has no pcp to copy.

## 2022-04-14 NOTE — Telephone Encounter (Signed)
-----   Message from Satira Sark, MD sent at 04/14/2022  4:09 PM EST ----- Results reviewed.  Carotid Dopplers show significant progression in bilateral ICA stenosis, both of which described in the 70 to 99% range.  He is on Eliquis and Lipitor.  Please get him in for [facility] consultation as soon as possible for further discussion.

## 2022-04-19 ENCOUNTER — Encounter: Payer: Self-pay | Admitting: Vascular Surgery

## 2022-04-19 ENCOUNTER — Ambulatory Visit (INDEPENDENT_AMBULATORY_CARE_PROVIDER_SITE_OTHER): Payer: Medicare Other | Admitting: Vascular Surgery

## 2022-04-19 VITALS — BP 155/82 | HR 65 | Temp 99.1°F | Ht 71.0 in | Wt 213.6 lb

## 2022-04-19 DIAGNOSIS — I6523 Occlusion and stenosis of bilateral carotid arteries: Secondary | ICD-10-CM | POA: Diagnosis not present

## 2022-04-19 NOTE — Progress Notes (Signed)
[facility] [place]  Patient name: William Hatfield MRN: [id number] DOB: 1945-06-02 Sex: male  REASON FOR CONSULT: Evaluation bilateral carotid stenosis  HPI: William Hatfield is a 77 y.o. male, who is here today for evaluation of bilateral carotid disease.  He reports that he noted pulsatile tinnitus in his left ear several years ago and underwent carotid duplex suggesting moderate disease.  He recently had repeat duplex follow-up at [facility] on 04/14/2022.  This suggest progression to critical stenosis bilateral.  He specifically denies any history of amaurosis fugax, aphasia, TIA or stroke.  He does have a history of coronary disease with prior coronary bypass in 2006 with subsequent caths in 2018 showing recurrent disease.  He is not having cardiac difficulty does have a pacemaker.  Of note, I did do carotid endarterectomy on his wife approximately 6 years ago.  Unfortunately she died of COVID infection with baseline pulmonary disorder.  Past Medical History:  Diagnosis Date   Atrial fibrillation ([place])    a. post op in 2006  b. recurrence on 08/2016 admission for NSTEMI   CAD (coronary artery disease)    a. 2006: s/p CABG at [facility] with LIMA to LAD, SVG to PDA, SVG to OM1, SVG to D1  b. 08/2016: NSTEMI 09/18/16 which showed severe native 3V CAD with patent SVG-->PLA, LIMA-->LAD and total occlusion of SVG--> OM1 with unsuccessful attempt at PCI of SVG--> OM with inability to restore flow.    Carotid artery disease (HCC)    CKD (chronic kidney disease)    Essential hypertension    Tremor     Family History  Problem Relation Age of Onset   Heart attack Father    Heart attack Paternal Grandfather     SOCIAL HISTORY: Social History   Socioeconomic History   Marital status: Widowed    Spouse name: Not on file   Number of children: Not on file   Years of education: Not on file   Highest education level: Not on file   Occupational History   Not on file  Tobacco Use   Smoking status: Former    Packs/day: 4.00    Years: 20.00    Total pack years: 80.00    Types: Cigarettes    Quit date: 02/28/1992    Years since quitting: 30.1   Smokeless tobacco: Former    Types: Snuff, Chew    Quit date: 02/27/1981  Vaping Use   Vaping Use: Never used  Substance and Sexual Activity   Alcohol use: No    Alcohol/week: 0.0 standard drinks of alcohol   Drug use: No   Sexual activity: Not on file  Other Topics Concern   Not on file  Social History Narrative   Not on file   Social Determinants of Health   Financial Resource Strain: Not on file  Food Insecurity: Not on file  Transportation Needs: Not on file  Physical Activity: Not on file  Stress: Not on file  Social Connections: Not on file  Intimate Partner Violence: Not on file    Allergies  Allergen Reactions   Niaspan [Niacin] Hives and Itching    NIASPAN ONLY    Current Outpatient Medications  Medication Sig Dispense Refill   acetaminophen (TYLENOL) 500 MG tablet Take 1,000 mg by mouth every 6 (six) hours as needed for moderate pain or headache.     allopurinol (ZYLOPRIM) 100 MG tablet Take 100 mg by mouth daily.     apixaban (ELIQUIS) 5  MG TABS tablet Take 1 tablet (5 mg total) by mouth 2 (two) times daily. 60 tablet 6   atorvastatin (LIPITOR) 40 MG tablet TAKE 1 TABLET EVERY EVENING 90 tablet 3   Coenzyme Q10 (COQ-10) 100 MG CAPS Take 100 mg by mouth daily.     cyanocobalamin 500 MCG tablet Take 500 mcg by mouth daily.     empagliflozin (JARDIANCE) 10 MG TABS tablet Take 1 tablet (10 mg total) by mouth daily before breakfast. 30 tablet 6   fluticasone (FLONASE) 50 MCG/ACT nasal spray Place 1 spray into both nostrils daily as needed for allergies or rhinitis.     furosemide (LASIX) 40 MG tablet Take 1 tablet (40 mg total) by mouth daily. 90 tablet 3   losartan (COZAAR) 50 MG tablet Take 1 tablet (50 mg total) by mouth daily. 90 tablet 3    metoprolol tartrate (LOPRESSOR) 25 MG tablet TAKE 1 TABLET TWICE DAILY 180 tablet 3   Multiple Vitamin (MULTIVITAMIN) tablet Take 1 tablet by mouth daily.     nitroGLYCERIN (NITROSTAT) 0.4 MG SL tablet Place 1 tablet (0.4 mg total) under the tongue every 5 (five) minutes x 3 doses as needed for chest pain. 25 tablet 3   No current facility-administered medications for this visit.    REVIEW OF SYSTEMS:  [X]$  denotes positive finding, [ ]$  denotes negative finding Cardiac  Comments:  Chest pain or chest pressure:    Shortness of breath upon exertion: x   Short of breath when lying flat:    Irregular heart rhythm:        Vascular    Pain in calf, thigh, or hip brought on by ambulation:    Pain in feet at night that wakes you up from your sleep:     Blood clot in your veins:    Leg swelling:         Pulmonary    Oxygen at home:    Productive cough:     Wheezing:         Neurologic    Sudden weakness in arms or legs:     Sudden numbness in arms or legs:     Sudden onset of difficulty speaking or slurred speech:    Temporary loss of vision in one eye:  x   Problems with dizziness:  x       Gastrointestinal    Blood in stool:     Vomited blood:         Genitourinary    Burning when urinating:     Blood in urine:        Psychiatric    Major depression:         Hematologic    Bleeding problems:    Problems with blood clotting too easily:        Skin    Rashes or ulcers:        Constitutional    Fever or chills:      PHYSICAL EXAM: Vitals:   04/19/22 0819  BP: (!) 155/82  Pulse: 65  Temp: 99.1 F (37.3 C)  SpO2: 99%  Weight: 213 lb 9.6 oz (96.9 kg)  Height: 5' 11"$  (1.803 m)    GENERAL: The patient is a well-nourished male, in no acute distress. The vital signs are documented above. CARDIOVASCULAR: Soft left carotid bruit, no bruit on the right.  2+ radial pulses bilaterally PULMONARY: There is good air exchange  MUSCULOSKELETAL: There are no major deformities  or cyanosis. NEUROLOGIC: No  focal weakness or paresthesias are detected. SKIN: There are no ulcers or rashes noted. PSYCHIATRIC: The patient has a normal affect.  DATA:  Duplex from [facility] on 04/14/2022 reveal markedly elevated systolic and diastolic velocities bilaterally  MEDICAL ISSUES: Probable bilateral critical carotid disease.  I have recommended CT angiogram for further evaluation.  He does have a remote history of moderate renal insufficiency.  I do not have any recent function studies available.  We will confirm that he does not have any renal insufficiency prior to proceeding with CT angiogram.  If he does have renal insufficiency, will get noncontrast study to determine the extent of his disease towards the skull base bilaterally.  Will see him back in the office in several weeks following the CT scan.  He will need cardiac clearance with Dr. Domenic Polite prior to any surgical planning if needed   Rosetta Posner, MD [facility] [phone] Pager [phone]  Note: Portions of this report may have been transcribed using voice recognition software.  Every effort has been made to ensure accuracy; however, inadvertent computerized transcription errors may still be present.

## 2022-04-27 ENCOUNTER — Encounter: Payer: Self-pay | Admitting: Radiology

## 2022-05-01 ENCOUNTER — Other Ambulatory Visit: Payer: Self-pay

## 2022-05-01 DIAGNOSIS — I6523 Occlusion and stenosis of bilateral carotid arteries: Secondary | ICD-10-CM

## 2022-05-09 ENCOUNTER — Ambulatory Visit (HOSPITAL_COMMUNITY)
Admission: RE | Admit: 2022-05-09 | Discharge: 2022-05-09 | Disposition: A | Discharge status: Home / Self Care | Payer: Medicare Other | Source: Ambulatory Visit | Attending: Vascular Surgery | Admitting: Vascular Surgery

## 2022-05-09 DIAGNOSIS — I6523 Occlusion and stenosis of bilateral carotid arteries: Secondary | ICD-10-CM | POA: Diagnosis present

## 2022-05-09 LAB — POCT I-STAT CREATININE: Creatinine, Ser: 1.5 mg/dL — ABNORMAL HIGH (ref 0.61–1.24)

## 2022-05-09 MED ORDER — IOHEXOL 350 MG/ML SOLN
75.0000 mL | Freq: Once | INTRAVENOUS | Status: AC | PRN
  Administered 2022-05-09: 75 mL via INTRAVENOUS

## 2022-05-10 ENCOUNTER — Ambulatory Visit (INDEPENDENT_AMBULATORY_CARE_PROVIDER_SITE_OTHER): Payer: Medicare Other | Admitting: Vascular Surgery

## 2022-05-10 ENCOUNTER — Encounter: Payer: Self-pay | Admitting: Vascular Surgery

## 2022-05-10 VITALS — BP 138/80 | HR 96 | Temp 98.2°F | Ht 71.0 in | Wt 213.2 lb

## 2022-05-10 DIAGNOSIS — I6523 Occlusion and stenosis of bilateral carotid arteries: Secondary | ICD-10-CM

## 2022-05-10 NOTE — Progress Notes (Signed)
[facility] [place]  Patient name: William Hatfield MRN: [id number] DOB: 1945-09-09 Sex: male  REASON FOR VISIT: Discuss CT scan from yesterday of carotid arteries  HPI: [name] is a 77 y.o. male here today for further discussion of evaluation of severe bilateral internal carotid artery stenosis.  I saw him 2 weeks ago with duplex suggesting critical stenoses bilaterally.  He underwent CT angiogram for further evaluation.  He remains asymptomatic.  Past Medical History:  Diagnosis Date   Atrial fibrillation ([place])    a. post op in 2006  b. recurrence on 08/2016 admission for NSTEMI   CAD (coronary artery disease)    a. 2006: s/[facility] CABG [facility] with LIMA to LAD, SVG to PDA, SVG to OM1, SVG to D1  b. 08/2016: NSTEMI 09/18/16 which showed severe native 3V CAD with patent SVG-->PLA, LIMA-->LAD and total occlusion of SVG--> OM1 with unsuccessful attempt at PCI of SVG--> OM with inability to restore flow.    Carotid artery disease (HCC)    CKD (chronic kidney disease)    Essential hypertension    Tremor     Family History  Problem Relation Age of Onset   Heart attack Father    Heart attack Paternal Grandfather     SOCIAL HISTORY: Social History   Tobacco Use   Smoking status: Former    Packs/day: 4.00    Years: 20.00    Total pack years: 80.00    Types: Cigarettes    Quit date: 02/28/1992    Years since quitting: 30.2   Smokeless tobacco: Former    Types: Snuff, Chew    Quit date: 02/27/1981  Substance Use Topics   Alcohol use: No    Alcohol/week: 0.0 standard drinks of alcohol    Allergies  Allergen Reactions   Niaspan [Niacin] Hives and Itching    NIASPAN ONLY    Current Outpatient Medications  Medication Sig Dispense Refill   acetaminophen (TYLENOL) 500 MG tablet Take 1,000 mg by mouth every 6 (six) hours as needed for moderate pain or headache.     allopurinol (ZYLOPRIM) 100 MG tablet Take 100 mg by mouth daily.      apixaban (ELIQUIS) 5 MG TABS tablet Take 1 tablet (5 mg total) by mouth 2 (two) times daily. 60 tablet 6   atorvastatin (LIPITOR) 40 MG tablet TAKE 1 TABLET EVERY EVENING 90 tablet 3   Coenzyme Q10 (COQ-10) 100 MG CAPS Take 100 mg by mouth daily.     cyanocobalamin 500 MCG tablet Take 500 mcg by mouth daily.     empagliflozin (JARDIANCE) 10 MG TABS tablet Take 1 tablet (10 mg total) by mouth daily before breakfast. 30 tablet 6   fluticasone (FLONASE) 50 MCG/ACT nasal spray Place 1 spray into both nostrils daily as needed for allergies or rhinitis.     furosemide (LASIX) 40 MG tablet Take 1 tablet (40 mg total) by mouth daily. 90 tablet 3   losartan (COZAAR) 50 MG tablet Take 1 tablet (50 mg total) by mouth daily. 90 tablet 3   metoprolol tartrate (LOPRESSOR) 25 MG tablet TAKE 1 TABLET TWICE DAILY 180 tablet 3   Multiple Vitamin (MULTIVITAMIN) tablet Take 1 tablet by mouth daily.     nitroGLYCERIN (NITROSTAT) 0.4 MG SL tablet Place 1 tablet (0.4 mg total) under the tongue every 5 (five) minutes x 3 doses as needed for chest pain. 25 tablet 3   No current facility-administered medications for this visit.    REVIEW  OF SYSTEMS:  '[X]'$  denotes positive finding, '[ ]'$  denotes negative finding Cardiac  Comments:  Chest pain or chest pressure:    Shortness of breath upon exertion:    Short of breath when lying flat:    Irregular heart rhythm:        Vascular    Pain in calf, thigh, or hip brought on by ambulation:    Pain in feet at night that wakes you up from your sleep:     Blood clot in your veins:    Leg swelling:           PHYSICAL EXAM: Vitals:   05/10/22 1304 05/10/22 1306  BP: (!) 144/86 138/80  Pulse: 96   Temp: 98.2 F (36.8 C)   SpO2: 98%   Weight: 213 lb 3.2 oz (96.7 kg)   Height: '5\' 11"'$  (1.803 m)     GENERAL: The patient is a well-nourished male, in no acute distress. The vital signs are documented above.   CT scan was reviewed with the patient.  This shows a severe  calcification and severe stenoses bilaterally.  On the left this is at the bifurcation.  On the right he has moderate to severe stenosis at the bifurcation and severe stenosis in the internal carotid artery 1 cm above the bifurcation.  He has a relatively low bifurcation bilaterally with normal internal carotid before the skull base  MEDICAL ISSUES: I discussed this in detail with the patient.  Explained the risk of severe asymptomatic stenosis.  I have recommended staged bilateral carotid endarterectomies.  He is right-handed and therefore I have recommended dominant hemisphere left endarterectomy initially.  I did review his films with Dr. Virl Cagey in our [place] office who will be doing the surgery.  I offered a outpatient evaluation with Dr. Virl Cagey.  The patient is comfortable meeting him the day of surgery.  Will then see him back in the office for follow-up and plan right endarterectomy after recovery.  I discussed risk of surgery to include risk of stroke at 1 to 1-1/2%    Rosetta Posner, MD FACS [facility] [place] Office Tel [phone]  Note: Portions of this report may have been transcribed using voice recognition software.  Every effort has been made to ensure accuracy; however, inadvertent computerized transcription errors may still be present.

## 2022-05-11 ENCOUNTER — Encounter: Payer: Self-pay | Admitting: Cardiology

## 2022-05-11 ENCOUNTER — Telehealth: Payer: Self-pay | Admitting: Cardiology

## 2022-05-11 NOTE — Telephone Encounter (Signed)
Error

## 2022-05-11 NOTE — Telephone Encounter (Signed)
   Pre-operative Risk Assessment    Patient Name: William Hatfield  DOB: 07/30/1945 MRN: [id number]      Request for Surgical Clearance    Procedure:   left carotid endarterectomy  Date of Surgery:  Clearance TBD                                 Surgeon:  Dr. Jerrol Banana Group or Practice Name:  [facility] Phone number:  [phone] Fax number:  [phone] attn: Herma Ard   Type of Clearance Requested:   - Medical  - Pharmacy:  Hold        Type of Anesthesia:  Not Indicated   Additional requests/questions:    SignedHipolito Bayley   05/11/2022, 4:03 PM

## 2022-05-12 NOTE — Telephone Encounter (Addendum)
   Patient Name: William Hatfield  DOB: [birth year] MRN: [id number]  Primary Cardiologist: Rozann Lesches, MD  Chart reviewed as part of pre-operative protocol coverage.   Per Dr. Domenic Polite, "Please refer to office note from January.  Patient was clinically stable at that time from a cardiac perspective.  History of CAD status post CABG with graft disease managed medically and also PAF.  RCRI perioperative cardiac risk index is class III, 6.6% chance of major adverse cardiac event.  Unless he has had a substantial change in cardiac symptoms, he should not require any further cardiac testing prior to carotid endarterectomy."   Spoke with patient by phone on 05/12/2022.  He denies any new symptoms or concerns.  Therefore, given past medical history and time since last visit, based on ACC/AHA guidelines, William Hatfield is at acceptable risk for the planned procedure without further cardiovascular testing.    Per office protocol, patient can hold Eliquis for 2-3 days prior to procedure.  Please resume Eliquis as soon as possible post procedure, at the discretion of the surgeon.   The patient was advised that if he develops new symptoms prior to surgery to contact our office to arrange for a follow-up visit, and he verbalized understanding.  I will route this recommendation to the requesting party via Epic fax function and remove from pre-op pool.  Please call with questions.  Lenna Sciara, NP 05/12/2022, 7:44 AM

## 2022-05-12 NOTE — Telephone Encounter (Signed)
    Patient Name: William Hatfield  DOB: [birth year] MRN: [id number]   Primary Cardiologist: Rozann Lesches, MD   Chart reviewed as part of pre-operative protocol coverage.    Per Dr. Domenic Polite, "Please refer to office note from January.  Patient was clinically stable at that time from a cardiac perspective.  History of CAD status post CABG with graft disease managed medically and also PAF.  RCRI perioperative cardiac risk index is class III, 6.6% chance of major adverse cardiac event.  Unless he has had a substantial change in cardiac symptoms, he should not require any further cardiac testing prior to carotid endarterectomy."     Spoke with patient by phone on 05/12/2022.  He denies any new symptoms or concerns.  Therefore, given past medical history and time since last visit, based on ACC/AHA guidelines, [name] is at acceptable risk for the planned procedure without further cardiovascular testing.      Per office protocol, patient can hold Eliquis for 2-3 days prior to procedure.  Please resume Eliquis as soon as possible post procedure, at the discretion of the surgeon.    The patient was advised that if he develops new symptoms prior to surgery to contact our office to arrange for a follow-up visit, and he verbalized understanding.   I will route this recommendation to the requesting party via Epic fax function and remove from pre-op pool.   Please call with questions.   Lenna Sciara, NP 05/12/2022,1056 AM

## 2022-05-12 NOTE — Telephone Encounter (Signed)
Patient with diagnosis of PAF(paroxysmal atrial fibrillation) on Eliquis for anticoagulation.    Procedure: left carotid endarterectomy  Date of procedure: TBD   CHA2DS2-VASc Score = 4   This indicates a 4.8% annual risk of stroke. The patient's score is based upon: CHF History: 0 HTN History: 1 Diabetes History: 0 Stroke History: 0 Vascular Disease History: 1 Age Score: 2 Gender Score: 0     CrCl 57 mL/min Platelet count 217 K   Per office protocol, patient can hold Eliquis for 2-3 days prior to procedure.     **This guidance is not considered finalized until pre-operative APP has relayed final recommendations.**

## 2022-05-16 ENCOUNTER — Other Ambulatory Visit: Payer: Self-pay

## 2022-05-16 DIAGNOSIS — I6523 Occlusion and stenosis of bilateral carotid arteries: Secondary | ICD-10-CM

## 2022-05-18 ENCOUNTER — Encounter: Payer: Self-pay | Admitting: Internal Medicine

## 2022-05-18 NOTE — Progress Notes (Signed)
[facility] DEVICE PROGRAMMING  Patient Information: Name:  William Hatfield  DOB:  Sep 09, 1945  MRN:  [id number]  Planned Procedure:  Left Carotid Endarterectomy  Surgeon:  Dr. [name]  Date of Procedure:  05/22/2022  Cautery will be used.  Position during surgery:  Supine    Device Information:  Clinic EP Physician:  Cristopher Peru, MD   Device Type:  Pacemaker Manufacturer and Phone #:  Medtronic: [phone] Pacemaker Dependent?:  Yes.   Date of Last Device Check:  04/09/2022 Normal Device Function?:  Yes.    Electrophysiologist's Recommendations:  Have magnet available. Provide continuous ECG monitoring when magnet is used or reprogramming is to be performed.  Procedure will likely interfere with device function.  Device should be programmed:  Asynchronous pacing during procedure and returned to normal programming after procedure  Per Device Clinic Standing Orders, Damian Leavell, RN  4:29 PM 05/18/2022

## 2022-05-18 NOTE — Progress Notes (Signed)
Surgical Instructions    Your procedure is scheduled on Monday, May 22, 2022.  Report to [facility] Main Entrance "A" at 5:30 A.M., then check in with the Admitting office.  Call this number if you have problems the morning of surgery:  [phone]   If you have any questions prior to your surgery date call [phone]: Open Monday-Friday 8am-4pm If you experience any cold or flu symptoms such as cough, fever, chills, shortness of breath, etc. between now and your scheduled surgery, please notify us at the above number     Remember:  Do not eat or drink after midnight the night before your surgery    Take these medicines the morning of surgery with A SIP OF WATER:  allopurinol (ZYLOPRIM)  atorvastatin (LIPITOR)  metoprolol tartrate (LOPRESSOR)    As needed: acetaminophen (TYLENOL)  fluticasone (FLONASE)    Follow your surgeon's instructions to hold apixaban (ELIQUIS) starting 3 days prior to surgery. Last dose  05/18/2022   As of today, STOP taking any Aspirin (unless otherwise instructed by your surgeon) Aleve, Naproxen, Ibuprofen, Motrin, Advil, Goody's, BC's, all herbal medications, fish oil, and all vitamins.           Do NOT Smoke (Tobacco/Vaping)  24 hours prior to your procedure  If you use a CPAP at night, you may bring your mask for your overnight stay.   Contacts, glasses, hearing aids, dentures or partials may not be worn into surgery, please bring cases for these belongings   For patients admitted to the hospital, discharge time will be determined by your treatment team.   Patients discharged the day of surgery will not be allowed to drive home, and someone needs to stay with them for 24 hours.   SURGICAL WAITING ROOM VISITATION Patients having surgery or a procedure may have no more than 2 support people in the waiting area - these visitors may rotate.   Children under the age of 67 must have an adult with them who is not the patient. If the patient needs  to stay at the hospital during part of their recovery, the visitor guidelines for inpatient rooms apply. Pre-op nurse will coordinate an appropriate time for 1 support person to accompany patient in pre-op.  This support person may not rotate.   Please refer to [email or web address] for the visitor guidelines for Inpatients (after your surgery is over and you are in a regular room).    Special instructions:    Oral Hygiene is also important to reduce your risk of infection.  Remember - BRUSH YOUR TEETH THE MORNING OF SURGERY WITH YOUR REGULAR TOOTHPASTE   [place]- Preparing For Surgery  Before surgery, you can play an important role. Because skin is not sterile, your skin needs to be as free of germs as possible. You can reduce the number of germs on your skin by washing with CHG (chlorahexidine gluconate) Soap before surgery.  CHG is an antiseptic cleaner which kills germs and bonds with the skin to continue killing germs even after washing.     Please do not use if you have an allergy to CHG or antibacterial soaps. If your skin becomes reddened/irritated stop using the CHG.  Do not shave (including legs and underarms) for at least 48 hours prior to first CHG shower. It is OK to shave your face.  Please follow these instructions carefully.     Shower the NIGHT BEFORE SURGERY and the MORNING OF SURGERY with CHG Soap.   If you chose  to wash your hair, wash your hair first as usual with your normal shampoo. After you shampoo, rinse your hair and body thoroughly to remove the shampoo.  Then [employer] and genitals (private parts) with your normal soap and rinse thoroughly to remove soap.  After that Use CHG Soap as you would any other liquid soap. You can apply CHG directly to the skin and wash gently with a scrungie or a clean washcloth.   Apply the CHG Soap to your body ONLY FROM THE NECK DOWN.  Do not use on open wounds or open sores.  Avoid contact with your eyes, ears, mouth and genitals (private parts). Wash Face and genitals (private parts)  with your normal soap.   Wash thoroughly, paying special attention to the area where your surgery will be performed.  Thoroughly rinse your body with warm water from the neck down.  DO NOT shower/wash with your normal soap after using and rinsing off the CHG Soap.  Pat yourself dry with a CLEAN TOWEL.  Wear CLEAN PAJAMAS to bed the night before surgery  Place CLEAN SHEETS on your bed the night before your surgery  DO NOT SLEEP WITH PETS.   Day of Surgery:  DO NOT SHAVE, THIS AREA IS THE SURGICAL SITE.  Take a shower with CHG soap. Wear Clean/Comfortable clothing the morning of surgery Do not wear lotions, powders, perfumes/cologne or deodorant.  Do not bring valuables to the hospital.  [facility] is not responsible for any belongings or valuables.     Remember to brush your teeth WITH YOUR REGULAR TOOTHPASTE.    If you received a COVID test during your pre-op visit, it is requested that you wear a mask when out in public, stay away from anyone that may not be feeling well, and notify your surgeon if you develop symptoms. If you have been in contact with anyone that has tested positive in the last 10 days, please notify your surgeon.    Please read over the following fact sheets that you were given.

## 2022-05-19 ENCOUNTER — Other Ambulatory Visit: Payer: Self-pay

## 2022-05-19 ENCOUNTER — Encounter (HOSPITAL_COMMUNITY)
Admission: RE | Admit: 2022-05-19 | Discharge: 2022-05-19 | Disposition: A | Discharge status: Home / Self Care | Payer: Medicare Other | Source: Ambulatory Visit | Attending: Vascular Surgery | Admitting: Vascular Surgery

## 2022-05-19 ENCOUNTER — Encounter (HOSPITAL_COMMUNITY): Payer: Self-pay

## 2022-05-19 VITALS — BP 164/74 | HR 74 | Resp 18 | Ht 71.0 in | Wt 208.7 lb

## 2022-05-19 DIAGNOSIS — Z01812 Encounter for preprocedural laboratory examination: Secondary | ICD-10-CM | POA: Insufficient documentation

## 2022-05-19 DIAGNOSIS — I6523 Occlusion and stenosis of bilateral carotid arteries: Secondary | ICD-10-CM

## 2022-05-19 DIAGNOSIS — I2510 Atherosclerotic heart disease of native coronary artery without angina pectoris: Secondary | ICD-10-CM | POA: Insufficient documentation

## 2022-05-19 DIAGNOSIS — I129 Hypertensive chronic kidney disease with stage 1 through stage 4 chronic kidney disease, or unspecified chronic kidney disease: Secondary | ICD-10-CM | POA: Insufficient documentation

## 2022-05-19 DIAGNOSIS — N189 Chronic kidney disease, unspecified: Secondary | ICD-10-CM | POA: Insufficient documentation

## 2022-05-19 DIAGNOSIS — Z01818 Encounter for other preprocedural examination: Secondary | ICD-10-CM

## 2022-05-19 DIAGNOSIS — Z87891 Personal history of nicotine dependence: Secondary | ICD-10-CM | POA: Insufficient documentation

## 2022-05-19 DIAGNOSIS — I25810 Atherosclerosis of coronary artery bypass graft(s) without angina pectoris: Secondary | ICD-10-CM | POA: Insufficient documentation

## 2022-05-19 DIAGNOSIS — I252 Old myocardial infarction: Secondary | ICD-10-CM | POA: Insufficient documentation

## 2022-05-19 HISTORY — DX: Presence of cardiac pacemaker: Z95.0

## 2022-05-19 HISTORY — DX: Unspecified osteoarthritis, unspecified site: M19.90

## 2022-05-19 LAB — COMPREHENSIVE METABOLIC PANEL
ALT: 18 U/L (ref 0–44)
AST: 20 U/L (ref 15–41)
Albumin: 4 g/dL (ref 3.5–5.0)
Alkaline Phosphatase: 65 U/L (ref 38–126)
Anion gap: 7 (ref 5–15)
BUN: 16 mg/dL (ref 8–23)
CO2: 26 mmol/L (ref 22–32)
Calcium: 9 mg/dL (ref 8.9–10.3)
Chloride: 104 mmol/L (ref 98–111)
Creatinine, Ser: 1.37 mg/dL — ABNORMAL HIGH (ref 0.61–1.24)
GFR, Estimated: 54 mL/min — ABNORMAL LOW (ref >60)
Glucose, Bld: 113 mg/dL — ABNORMAL HIGH (ref 70–99)
Potassium: 4.1 mmol/L (ref 3.5–5.1)
Sodium: 137 mmol/L (ref 135–145)
Total Bilirubin: 0.7 mg/dL (ref 0.3–1.2)
Total Protein: 6.6 g/dL (ref 6.5–8.1)

## 2022-05-19 LAB — URINALYSIS, ROUTINE W REFLEX MICROSCOPIC
Bilirubin Urine: NEGATIVE
Glucose, UA: NEGATIVE mg/dL
Hgb urine dipstick: NEGATIVE
Ketones, ur: NEGATIVE mg/dL
Leukocytes,Ua: NEGATIVE
Nitrite: NEGATIVE
Protein, ur: NEGATIVE mg/dL
Specific Gravity, Urine: 1.004 — ABNORMAL LOW (ref 1.005–1.030)
pH: 5 (ref 5.0–8.0)

## 2022-05-19 LAB — CBC
HCT: 40.8 % (ref 39.0–52.0)
Hemoglobin: 13.2 g/dL (ref 13.0–17.0)
MCH: 29.7 pg (ref 26.0–34.0)
MCHC: 32.4 g/dL (ref 30.0–36.0)
MCV: 91.9 fL (ref 80.0–100.0)
Platelets: 213 10*3/uL (ref 150–400)
RBC: 4.44 MIL/uL (ref 4.22–5.81)
RDW: 14.2 % (ref 11.5–15.5)
WBC: 8.4 10*3/uL (ref 4.0–10.5)
nRBC: 0 % (ref 0.0–0.2)

## 2022-05-19 LAB — TYPE AND SCREEN
ABO/RH(D): A POS
Antibody Screen: NEGATIVE

## 2022-05-19 LAB — PROTIME-INR
INR: 1.2 (ref 0.8–1.2)
Prothrombin Time: 14.9 seconds (ref 11.4–15.2)

## 2022-05-19 LAB — SURGICAL PCR SCREEN
MRSA, PCR: NEGATIVE
Staphylococcus aureus: NEGATIVE

## 2022-05-19 LAB — APTT: aPTT: 29 seconds (ref 24–36)
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
## 2022-05-19 NOTE — Progress Notes (Signed)
PCP - denies Cardiologist - Dr. Rozann Lesches  PPM/ICD - pacemaker (Medtronic) Device Orders - received. Rep needs to be present  Rep Notified - Leanna Sato was notified 3/22 and confirmed that a rep will be present (notified by email and phone)  Chest x-ray - 02/20/20 EKG - 03/27/22 Stress Test - denies ECHO - 01/01/20 Cardiac Cath - 09/18/16  Sleep Study - denies   DM- denies  Blood Thinner Instructions: Hold Eliquis 3 days prior to surgery. Last dose 3/21 Aspirin Instructions: n/a  ERAS Protcol - no, NPO   COVID TEST- n/a   Anesthesia review: yes, cardiac hx  Patient denies shortness of breath, fever, cough and chest pain at PAT appointment   All instructions explained to the patient, with a verbal understanding of the material. Patient agrees to go over the instructions while at home for a better understanding.  The opportunity to ask questions was provided.

## 2022-05-19 NOTE — Anesthesia Preprocedure Evaluation (Addendum)
Anesthesia Evaluation  Patient identified by MRN, date of birth, ID band  Reviewed: Allergy & Precautions, NPO status , Patient's Chart, lab work & pertinent test results  History of Anesthesia Complications Negative for: history of anesthetic complications  Airway Mallampati: I  TM Distance: >3 FB Neck ROM: Full    Dental no notable dental hx. (+) Dental Advisory Given   Pulmonary former smoker   Pulmonary exam normal        Cardiovascular hypertension, Pt. on medications and Pt. on home beta blockers + CAD and + CABG  Normal cardiovascular exam+ pacemaker   Electrophysiologist's Recommendations:  Have magnet available.  Provide continuous ECG monitoring when magnet is used or reprogramming is to be performed.   Procedure will likely interfere with device function.  Device should be programmed:  Asynchronous pacing during procedure and returned to normal programming after procedure    Echo 01/01/20: IMPRESSIONS   1. Left ventricular ejection fraction, by estimation, is 60 to 65%. The  left ventricle has normal function. The left ventricle has no regional  wall motion abnormalities. There is mild left ventricular hypertrophy.  Left ventricular diastolic parameters  are consistent with Grade I diastolic dysfunction (impaired relaxation).   2. Right ventricular systolic function is normal. The right ventricular  size is normal.   3. Left atrial size was moderately dilated.   4. The mitral valve is normal in structure. Trivial mitral valve  regurgitation. No evidence of mitral stenosis.   5. The aortic valve is tricuspid. There is mild calcification of the  aortic valve. There is mild thickening of the aortic valve. Aortic valve  regurgitation is not visualized. No aortic stenosis is present.   6. The inferior vena cava is normal in size with greater than 50%  respiratory variability, suggesting right atrial pressure of 3 mmHg.          Neuro/Psych negative neurological ROS     GI/Hepatic negative GI ROS, Neg liver ROS,,,  Endo/Other  negative endocrine ROS    Renal/GU negative Renal ROS     Musculoskeletal negative musculoskeletal ROS (+)    Abdominal   Peds  Hematology negative hematology ROS (+)   Anesthesia Other Findings reoperative cardiology input outlined by Diona Browner, NP on 05/12/22: "'Per Dr. Domenic Polite, "Please refer to office note from January.  Patient was clinically stable at that time from a cardiac perspective.  History of CAD status post CABG with graft disease managed medically and also PAF.  RCRI perioperative cardiac risk index is class III, 6.6% chance of major adverse cardiac event.  Unless he has had a substantial change in cardiac symptoms, he should not require any further cardiac testing prior to carotid endarterectomy.'     Reproductive/Obstetrics                             Anesthesia Physical Anesthesia Plan  ASA: 3  Anesthesia Plan: General   Post-op Pain Management: Minimal or no pain anticipated and Tylenol PO (pre-op)*   Induction: Intravenous  PONV Risk Score and Plan: 4 or greater and Ondansetron, Dexamethasone and Diphenhydramine  Airway Management Planned: Oral ETT  Additional Equipment: Arterial line  Intra-op Plan:   Post-operative Plan: Extubation in OR  Informed Consent: I have reviewed the patients History and Physical, chart, labs and discussed the procedure including the risks, benefits and alternatives for the proposed anesthesia with the patient or authorized representative who has indicated his/her understanding and  acceptance.     Dental advisory given  Plan Discussed with: Anesthesiologist and CRNA  Anesthesia Plan Comments: (PAT note written 05/19/2022 by Myra Gianotti, PA-C.  )       Anesthesia Quick Evaluation

## 2022-05-20 ENCOUNTER — Encounter (HOSPITAL_COMMUNITY): Payer: Self-pay | Admitting: Vascular Surgery

## 2022-05-22 ENCOUNTER — Encounter (HOSPITAL_COMMUNITY)
Admission: RE | Disposition: A | Discharge status: Home / Self Care | Payer: Self-pay | Source: Home / Self Care | Attending: Vascular Surgery

## 2022-05-22 ENCOUNTER — Encounter (HOSPITAL_COMMUNITY): Payer: Self-pay | Admitting: Vascular Surgery

## 2022-05-22 ENCOUNTER — Other Ambulatory Visit: Payer: Self-pay

## 2022-05-22 ENCOUNTER — Inpatient Hospital Stay (HOSPITAL_COMMUNITY): Payer: Medicare Other | Admitting: Certified Registered"

## 2022-05-22 ENCOUNTER — Inpatient Hospital Stay (HOSPITAL_COMMUNITY)
Admission: RE | Admit: 2022-05-22 | Discharge: 2022-05-23 | DRG: 038 | Disposition: A | Discharge status: Home / Self Care | Payer: Medicare Other | Attending: Vascular Surgery | Admitting: Vascular Surgery

## 2022-05-22 ENCOUNTER — Inpatient Hospital Stay (HOSPITAL_COMMUNITY): Payer: Medicare Other | Admitting: Vascular Surgery

## 2022-05-22 DIAGNOSIS — I129 Hypertensive chronic kidney disease with stage 1 through stage 4 chronic kidney disease, or unspecified chronic kidney disease: Secondary | ICD-10-CM | POA: Diagnosis present

## 2022-05-22 DIAGNOSIS — Z951 Presence of aortocoronary bypass graft: Secondary | ICD-10-CM

## 2022-05-22 DIAGNOSIS — I252 Old myocardial infarction: Secondary | ICD-10-CM | POA: Diagnosis not present

## 2022-05-22 DIAGNOSIS — Z7901 Long term (current) use of anticoagulants: Secondary | ICD-10-CM | POA: Diagnosis not present

## 2022-05-22 DIAGNOSIS — Z950 Presence of cardiac pacemaker: Secondary | ICD-10-CM

## 2022-05-22 DIAGNOSIS — N1831 Chronic kidney disease, stage 3a: Secondary | ICD-10-CM | POA: Diagnosis present

## 2022-05-22 DIAGNOSIS — I2510 Atherosclerotic heart disease of native coronary artery without angina pectoris: Secondary | ICD-10-CM

## 2022-05-22 DIAGNOSIS — Z79899 Other long term (current) drug therapy: Secondary | ICD-10-CM | POA: Diagnosis not present

## 2022-05-22 DIAGNOSIS — I6522 Occlusion and stenosis of left carotid artery: Secondary | ICD-10-CM | POA: Diagnosis not present

## 2022-05-22 DIAGNOSIS — I10 Essential (primary) hypertension: Secondary | ICD-10-CM

## 2022-05-22 DIAGNOSIS — Z87891 Personal history of nicotine dependence: Secondary | ICD-10-CM

## 2022-05-22 DIAGNOSIS — Z7984 Long term (current) use of oral hypoglycemic drugs: Secondary | ICD-10-CM | POA: Diagnosis not present

## 2022-05-22 DIAGNOSIS — Z888 Allergy status to other drugs, medicaments and biological substances status: Secondary | ICD-10-CM | POA: Diagnosis not present

## 2022-05-22 DIAGNOSIS — I480 Paroxysmal atrial fibrillation: Secondary | ICD-10-CM | POA: Diagnosis present

## 2022-05-22 DIAGNOSIS — Z8249 Family history of ischemic heart disease and other diseases of the circulatory system: Secondary | ICD-10-CM

## 2022-05-22 DIAGNOSIS — R519 Headache, unspecified: Secondary | ICD-10-CM | POA: Diagnosis not present

## 2022-05-22 DIAGNOSIS — I6523 Occlusion and stenosis of bilateral carotid arteries: Secondary | ICD-10-CM | POA: Diagnosis present

## 2022-05-22 DIAGNOSIS — M1990 Unspecified osteoarthritis, unspecified site: Secondary | ICD-10-CM | POA: Diagnosis present

## 2022-05-22 DIAGNOSIS — I25810 Atherosclerosis of coronary artery bypass graft(s) without angina pectoris: Secondary | ICD-10-CM | POA: Diagnosis present

## 2022-05-22 DIAGNOSIS — I6529 Occlusion and stenosis of unspecified carotid artery: Principal | ICD-10-CM | POA: Diagnosis present

## 2022-05-22 HISTORY — PX: ENDARTERECTOMY: SHX5162

## 2022-05-22 LAB — ABO/RH: ABO/RH(D): A POS

## 2022-05-22 LAB — POCT ACTIVATED CLOTTING TIME
Activated Clotting Time: 244 seconds
Activated Clotting Time: 266 seconds

## 2022-05-22 SURGERY — ENDARTERECTOMY, CAROTID
Anesthesia: General | Site: Neck | Laterality: Left

## 2022-05-22 MED ORDER — HEMOSTATIC AGENTS (NO CHARGE) OPTIME
TOPICAL | Status: DC | PRN
  Administered 2022-05-22 (×2): 1 via TOPICAL

## 2022-05-22 MED ORDER — LOSARTAN POTASSIUM 50 MG PO TABS: 50.0000 mg | ORAL_TABLET | Freq: Every day | ORAL | Status: DC

## 2022-05-22 MED ORDER — ACETAMINOPHEN 500 MG PO TABS
1000.0000 mg | ORAL_TABLET | Freq: Once | ORAL | Status: AC
  Administered 2022-05-22: 1000 mg via ORAL
  Filled 2022-05-22: qty 2

## 2022-05-22 MED ORDER — ACETAMINOPHEN 325 MG PO TABS
ORAL_TABLET | ORAL | Status: AC
  Filled 2022-05-22: qty 2

## 2022-05-22 MED ORDER — CLOPIDOGREL BISULFATE 75 MG PO TABS
75.0000 mg | ORAL_TABLET | Freq: Every day | ORAL | Status: DC
  Administered 2022-05-23: 75 mg via ORAL
  Filled 2022-05-22: qty 1

## 2022-05-22 MED ORDER — LIDOCAINE HCL (PF) 1 % IJ SOLN
INTRAMUSCULAR | Status: AC
  Filled 2022-05-22: qty 10

## 2022-05-22 MED ORDER — DOCUSATE SODIUM 100 MG PO CAPS: 100.0000 mg | ORAL_CAPSULE | Freq: Every day | ORAL | Status: DC

## 2022-05-22 MED ORDER — SODIUM CHLORIDE 0.9 % IV SOLN
0.1500 ug/kg/min | INTRAVENOUS | Status: DC
  Administered 2022-05-22: .05 ug/kg/min via INTRAVENOUS
  Filled 2022-05-22 (×2): qty 2000

## 2022-05-22 MED ORDER — ONDANSETRON HCL 4 MG/2ML IJ SOLN
INTRAMUSCULAR | Status: DC | PRN
  Administered 2022-05-22: 4 mg via INTRAVENOUS

## 2022-05-22 MED ORDER — DEXAMETHASONE SODIUM PHOSPHATE 10 MG/ML IJ SOLN
INTRAMUSCULAR | Status: AC
  Filled 2022-05-22: qty 1

## 2022-05-22 MED ORDER — HEPARIN SODIUM (PORCINE) 1000 UNIT/ML IJ SOLN
INTRAMUSCULAR | Status: AC
  Filled 2022-05-22: qty 10

## 2022-05-22 MED ORDER — ACETAMINOPHEN 325 MG PO TABS
325.0000 mg | ORAL_TABLET | ORAL | Status: DC | PRN
  Administered 2022-05-22: 650 mg via ORAL

## 2022-05-22 MED ORDER — FENTANYL CITRATE (PF) 250 MCG/5ML IJ SOLN
INTRAMUSCULAR | Status: AC
  Filled 2022-05-22: qty 5

## 2022-05-22 MED ORDER — GUAIFENESIN-DM 100-10 MG/5ML PO SYRP: 15.0000 mL | ORAL_SOLUTION | ORAL | Status: DC | PRN

## 2022-05-22 MED ORDER — OXYCODONE-ACETAMINOPHEN 5-325 MG PO TABS
1.0000 | ORAL_TABLET | ORAL | Status: DC | PRN
  Administered 2022-05-22 – 2022-05-23 (×3): 2 via ORAL
  Filled 2022-05-22 (×3): qty 2

## 2022-05-22 MED ORDER — PANTOPRAZOLE SODIUM 40 MG PO TBEC
40.0000 mg | DELAYED_RELEASE_TABLET | Freq: Every day | ORAL | Status: DC
  Administered 2022-05-22: 40 mg via ORAL
  Filled 2022-05-22: qty 1

## 2022-05-22 MED ORDER — METOPROLOL TARTRATE 5 MG/5ML IV SOLN: 2.0000 mg | INTRAVENOUS | Status: DC | PRN

## 2022-05-22 MED ORDER — HEPARIN SODIUM (PORCINE) 1000 UNIT/ML IJ SOLN
INTRAMUSCULAR | Status: DC | PRN
  Administered 2022-05-22: 9000 [IU] via INTRAVENOUS
  Administered 2022-05-22: 2000 [IU] via INTRAVENOUS

## 2022-05-22 MED ORDER — CHLORHEXIDINE GLUCONATE CLOTH 2 % EX PADS: 6.0000 | MEDICATED_PAD | Freq: Once | CUTANEOUS | Status: DC

## 2022-05-22 MED ORDER — MORPHINE SULFATE (PF) 2 MG/ML IV SOLN
2.0000 mg | INTRAVENOUS | Status: DC | PRN
  Filled 2022-05-22: qty 1

## 2022-05-22 MED ORDER — LACTATED RINGERS IV SOLN: INTRAVENOUS | Status: DC

## 2022-05-22 MED ORDER — FENTANYL CITRATE (PF) 100 MCG/2ML IJ SOLN
INTRAMUSCULAR | Status: DC | PRN
  Administered 2022-05-22: 100 ug via INTRAVENOUS

## 2022-05-22 MED ORDER — DEXTRAN 40 IN SALINE 10-0.9 % IV SOLN
10.0000 mL/kg | Freq: Once | INTRAVENOUS | Status: DC
  Filled 2022-05-22: qty 1000

## 2022-05-22 MED ORDER — HEPARIN 6000 UNIT IRRIGATION SOLUTION
Status: DC | PRN
  Administered 2022-05-22: 1

## 2022-05-22 MED ORDER — AMISULPRIDE (ANTIEMETIC) 5 MG/2ML IV SOLN: 10.0000 mg | Freq: Once | INTRAVENOUS | Status: DC | PRN

## 2022-05-22 MED ORDER — EPHEDRINE SULFATE-NACL 50-0.9 MG/10ML-% IV SOSY
PREFILLED_SYRINGE | INTRAVENOUS | Status: DC | PRN
  Administered 2022-05-22: 5 mg via INTRAVENOUS
  Administered 2022-05-22: 10 mg via INTRAVENOUS

## 2022-05-22 MED ORDER — ONDANSETRON HCL 4 MG/2ML IJ SOLN: 4.0000 mg | Freq: Four times a day (QID) | INTRAMUSCULAR | Status: DC | PRN

## 2022-05-22 MED ORDER — ALUM & MAG HYDROXIDE-SIMETH 200-200-20 MG/5ML PO SUSP: 15.0000 mL | ORAL | Status: DC | PRN

## 2022-05-22 MED ORDER — SODIUM CHLORIDE 0.9 % IV SOLN: INTRAVENOUS | Status: DC

## 2022-05-22 MED ORDER — PROPOFOL 10 MG/ML IV BOLUS
INTRAVENOUS | Status: DC | PRN
  Administered 2022-05-22: 140 mg via INTRAVENOUS

## 2022-05-22 MED ORDER — ONDANSETRON HCL 4 MG/2ML IJ SOLN
INTRAMUSCULAR | Status: AC
  Filled 2022-05-22: qty 2

## 2022-05-22 MED ORDER — POTASSIUM CHLORIDE CRYS ER 20 MEQ PO TBCR: 20.0000 meq | EXTENDED_RELEASE_TABLET | Freq: Every day | ORAL | Status: DC | PRN

## 2022-05-22 MED ORDER — DEXAMETHASONE SODIUM PHOSPHATE 10 MG/ML IJ SOLN
INTRAMUSCULAR | Status: DC | PRN
  Administered 2022-05-22: 4 mg via INTRAVENOUS

## 2022-05-22 MED ORDER — PROMETHAZINE HCL 25 MG/ML IJ SOLN: 6.2500 mg | INTRAMUSCULAR | Status: DC | PRN

## 2022-05-22 MED ORDER — ORAL CARE MOUTH RINSE: 15.0000 mL | Freq: Once | OROMUCOSAL | Status: AC

## 2022-05-22 MED ORDER — LABETALOL HCL 5 MG/ML IV SOLN: 10.0000 mg | INTRAVENOUS | Status: DC | PRN

## 2022-05-22 MED ORDER — STERILE WATER FOR IRRIGATION IR SOLN
Status: DC | PRN
  Administered 2022-05-22: 1000 mL

## 2022-05-22 MED ORDER — SURGIFLO WITH THROMBIN (HEMOSTATIC MATRIX KIT) OPTIME
TOPICAL | Status: DC | PRN
  Administered 2022-05-22: 1 via TOPICAL

## 2022-05-22 MED ORDER — SUGAMMADEX SODIUM 200 MG/2ML IV SOLN
INTRAVENOUS | Status: DC | PRN
  Administered 2022-05-22: 200 mg via INTRAVENOUS

## 2022-05-22 MED ORDER — ASPIRIN 81 MG PO TBEC
81.0000 mg | DELAYED_RELEASE_TABLET | Freq: Every day | ORAL | Status: DC
  Administered 2022-05-23: 81 mg via ORAL
  Filled 2022-05-22: qty 1

## 2022-05-22 MED ORDER — PHENYLEPHRINE HCL-NACL 20-0.9 MG/250ML-% IV SOLN
INTRAVENOUS | Status: DC | PRN
  Administered 2022-05-22: 40 ug/min via INTRAVENOUS
  Administered 2022-05-22: 30 ug/min via INTRAVENOUS
  Administered 2022-05-22: 60 ug/min via INTRAVENOUS

## 2022-05-22 MED ORDER — MAGNESIUM SULFATE 2 GM/50ML IV SOLN: 2.0000 g | Freq: Every day | INTRAVENOUS | Status: DC | PRN

## 2022-05-22 MED ORDER — SODIUM CHLORIDE 0.9 % IV SOLN: 500.0000 mL | Freq: Once | INTRAVENOUS | Status: DC | PRN

## 2022-05-22 MED ORDER — CHLORHEXIDINE GLUCONATE 0.12 % MT SOLN
15.0000 mL | Freq: Once | OROMUCOSAL | Status: AC
  Administered 2022-05-22: 15 mL via OROMUCOSAL
  Filled 2022-05-22: qty 15

## 2022-05-22 MED ORDER — CEFAZOLIN SODIUM-DEXTROSE 2-4 GM/100ML-% IV SOLN
2.0000 g | Freq: Three times a day (TID) | INTRAVENOUS | Status: AC
  Administered 2022-05-22 – 2022-05-23 (×2): 2 g via INTRAVENOUS
  Filled 2022-05-22 (×2): qty 100

## 2022-05-22 MED ORDER — LACTATED RINGERS IV SOLN: INTRAVENOUS | Status: DC | PRN

## 2022-05-22 MED ORDER — PHENOL 1.4 % MT LIQD: 1.0000 | OROMUCOSAL | Status: DC | PRN

## 2022-05-22 MED ORDER — ACETAMINOPHEN 325 MG RE SUPP: 325.0000 mg | RECTAL | Status: DC | PRN

## 2022-05-22 MED ORDER — MIDAZOLAM HCL 2 MG/2ML IJ SOLN
INTRAMUSCULAR | Status: AC
  Filled 2022-05-22: qty 2

## 2022-05-22 MED ORDER — LIDOCAINE 2% (20 MG/ML) 5 ML SYRINGE
INTRAMUSCULAR | Status: DC | PRN
  Administered 2022-05-22: 100 mg via INTRAVENOUS

## 2022-05-22 MED ORDER — PROTAMINE SULFATE 10 MG/ML IV SOLN
INTRAVENOUS | Status: DC | PRN
  Administered 2022-05-22: 50 mg via INTRAVENOUS

## 2022-05-22 MED ORDER — 0.9 % SODIUM CHLORIDE (POUR BTL) OPTIME
TOPICAL | Status: DC | PRN
  Administered 2022-05-22 (×2): 1000 mL

## 2022-05-22 MED ORDER — HEPARIN 6000 UNIT IRRIGATION SOLUTION
Status: AC
  Filled 2022-05-22: qty 500

## 2022-05-22 MED ORDER — MAGNESIUM HYDROXIDE 400 MG/5ML PO SUSP: 30.0000 mL | Freq: Every day | ORAL | Status: DC | PRN

## 2022-05-22 MED ORDER — ATORVASTATIN CALCIUM 40 MG PO TABS: 40.0000 mg | ORAL_TABLET | Freq: Every evening | ORAL | Status: DC

## 2022-05-22 MED ORDER — CEFAZOLIN SODIUM-DEXTROSE 2-4 GM/100ML-% IV SOLN
2.0000 g | INTRAVENOUS | Status: AC
  Administered 2022-05-22: 2 g via INTRAVENOUS
  Filled 2022-05-22: qty 100

## 2022-05-22 MED ORDER — PROPOFOL 10 MG/ML IV BOLUS
INTRAVENOUS | Status: AC
  Filled 2022-05-22: qty 20

## 2022-05-22 MED ORDER — HYDRALAZINE HCL 20 MG/ML IJ SOLN: 5.0000 mg | INTRAMUSCULAR | Status: DC | PRN

## 2022-05-22 MED ORDER — ROCURONIUM BROMIDE 10 MG/ML (PF) SYRINGE
PREFILLED_SYRINGE | INTRAVENOUS | Status: DC | PRN
  Administered 2022-05-22: 70 mg via INTRAVENOUS

## 2022-05-22 MED ORDER — FENTANYL CITRATE (PF) 100 MCG/2ML IJ SOLN: 25.0000 ug | INTRAMUSCULAR | Status: DC | PRN

## 2022-05-22 MED ORDER — METOPROLOL TARTRATE 25 MG PO TABS
25.0000 mg | ORAL_TABLET | Freq: Two times a day (BID) | ORAL | Status: DC
  Administered 2022-05-22: 25 mg via ORAL
  Filled 2022-05-22: qty 1

## 2022-05-22 MED ORDER — DEXTRAN 40 IN SALINE 10-0.9 % IV SOLN
INTRAVENOUS | Status: AC | PRN
  Administered 2022-05-22: 500 mL

## 2022-05-22 MED ORDER — CLOPIDOGREL BISULFATE 75 MG PO TABS
300.0000 mg | ORAL_TABLET | Freq: Once | ORAL | Status: AC
  Administered 2022-05-22: 300 mg via ORAL
  Filled 2022-05-22: qty 4

## 2022-05-22 MED ORDER — ASPIRIN 81 MG PO CHEW
324.0000 mg | CHEWABLE_TABLET | Freq: Once | ORAL | Status: AC
  Administered 2022-05-22: 324 mg via ORAL
  Filled 2022-05-22: qty 4

## 2022-05-22 SURGICAL SUPPLY — 49 items
ADH SKN CLS APL DERMABOND .7 (GAUZE/BANDAGES/DRESSINGS) ×1
AGENT HMST KT MTR STRL THRMB (HEMOSTASIS) ×1
BAG COUNTER SPONGE SURGICOUNT (BAG) ×1 IMPLANT
BAG DECANTER FOR FLEXI CONT (MISCELLANEOUS) IMPLANT
BAG SPNG CNTER NS LX DISP (BAG) ×1
CANISTER SUCT 3000ML PPV (MISCELLANEOUS) ×1 IMPLANT
CANNULA VESSEL 3MM 2 BLNT TIP (CANNULA) ×1 IMPLANT
CATH ROBINSON RED A/P 18FR (CATHETERS) ×1 IMPLANT
CLIP LIGATING EXTRA MED SLVR (CLIP) ×1 IMPLANT
CLIP LIGATING EXTRA SM BLUE (MISCELLANEOUS) ×1 IMPLANT
COVER PROBE CYLINDRICAL 5X96 (MISCELLANEOUS) IMPLANT
COVER PROBE W GEL 5X96 (DRAPES) ×1 IMPLANT
COVER TRANSDUCER ULTRASND GEL (DISPOSABLE) ×1 IMPLANT
DERMABOND ADVANCED .7 DNX12 (GAUZE/BANDAGES/DRESSINGS) ×1 IMPLANT
DRAIN CHANNEL 15F RND FF W/TCR (WOUND CARE) IMPLANT
DRAPE INCISE IOBAN 66X45 STRL (DRAPES) IMPLANT
ELECT REM PT RETURN 9FT ADLT (ELECTROSURGICAL) ×1
ELECTRODE REM PT RTRN 9FT ADLT (ELECTROSURGICAL) ×1 IMPLANT
GLOVE BIOGEL PI IND STRL 8 (GLOVE) ×1 IMPLANT
GOWN STRL REUS W/ TWL LRG LVL3 (GOWN DISPOSABLE) ×2 IMPLANT
GOWN STRL REUS W/TWL 2XL LVL3 (GOWN DISPOSABLE) ×2 IMPLANT
GOWN STRL REUS W/TWL LRG LVL3 (GOWN DISPOSABLE) ×2
HEMOSTAT SNOW SURGICEL 2X4 (HEMOSTASIS) IMPLANT
KIT BASIN OR (CUSTOM PROCEDURE TRAY) ×1 IMPLANT
KIT SHUNT ARGYLE CAROTID ART 6 (VASCULAR PRODUCTS) IMPLANT
KIT TURNOVER KIT B (KITS) ×1 IMPLANT
LOOP VASCULAR MINI 18 RED (MISCELLANEOUS)
NDL HYPO 25GX1X1/2 BEV (NEEDLE) IMPLANT
NEEDLE HYPO 25GX1X1/2 BEV (NEEDLE) IMPLANT
NS IRRIG 1000ML POUR BTL (IV SOLUTION) ×3 IMPLANT
PACK CAROTID (CUSTOM PROCEDURE TRAY) ×1 IMPLANT
PAD ARMBOARD 7.5X6 YLW CONV (MISCELLANEOUS) ×2 IMPLANT
PATCH HEMASHIELD 8X75 (Vascular Products) IMPLANT
PATCH VASC XENOSURE 1CMX6CM (Vascular Products) ×1 IMPLANT
PATCH VASC XENOSURE 1X6 (Vascular Products) IMPLANT
POSITIONER HEAD DONUT 9IN (MISCELLANEOUS) ×1 IMPLANT
SET WALTER ACTIVATION W/DRAPE (SET/KITS/TRAYS/PACK) ×1 IMPLANT
SURGIFLO W/THROMBIN 8M KIT (HEMOSTASIS) IMPLANT
SUT MNCRL AB 4-0 PS2 18 (SUTURE) ×1 IMPLANT
SUT PROLENE 6 0 BV (SUTURE) ×1 IMPLANT
SUT VIC AB 2-0 CT1 27 (SUTURE) ×1
SUT VIC AB 2-0 CT1 TAPERPNT 27 (SUTURE) ×1 IMPLANT
SUT VIC AB 3-0 SH 27 (SUTURE) ×1
SUT VIC AB 3-0 SH 27X BRD (SUTURE) ×1 IMPLANT
SYR 20CC LL (SYRINGE) ×2 IMPLANT
TOWEL GREEN STERILE (TOWEL DISPOSABLE) ×1 IMPLANT
TUBING ART PRESS 48 MALE/FEM (TUBING) IMPLANT
VASCULAR TIE MINI RED 18IN STL (MISCELLANEOUS) IMPLANT
WATER STERILE IRR 1000ML POUR (IV SOLUTION) ×1 IMPLANT

## 2022-05-22 NOTE — Anesthesia Procedure Notes (Signed)
Arterial Line Insertion Start/End3/25/2024 7:10 AM, 05/22/2022 7:15 AM Performed by: [name], MD, Kionna Brier, Kathrin Penner, CRNA, CRNA  Preanesthetic checklist: patient identified, IV checked, site marked, risks and benefits discussed, surgical consent, monitors and equipment checked, pre-op evaluation, timeout performed and anesthesia consent Lidocaine 1% used for infiltration Left, radial was placed Catheter size: 20 G Hand hygiene performed  and maximum sterile barriers used   Attempts: 1 Procedure performed without using ultrasound guided technique. Ultrasound Notes:anatomy identified Following insertion, Biopatch and dressing applied.

## 2022-05-22 NOTE — Transfer of Care (Signed)
Immediate Anesthesia Transfer of Care Note  Patient: William Hatfield  Procedure(s) Performed: LEFT CAROTID ENDARTERECTOMY (Left: Neck)  Patient Location: PACU  Anesthesia Type:General  Level of Consciousness: awake, alert , and oriented  Airway & Oxygen Therapy: Patient Spontanous Breathing  Post-op Assessment: Report given to RN, Post -op Vital signs reviewed and stable, Patient moving all extremities X 4, and Patient able to stick tongue midline  Post vital signs: Reviewed and stable  Last Vitals:  Vitals Value Taken Time  BP 138/74 05/22/22 1032  Temp    Pulse 84 05/22/22 1036  Resp 20 05/22/22 1036  SpO2 97 % 05/22/22 1036  Vitals shown include unvalidated device data.  Last Pain:  Vitals:   05/22/22 0627  TempSrc:   PainSc: 0-No pain         Complications: No notable events documented.

## 2022-05-22 NOTE — Op Note (Signed)
NAME: Dian Berna    MRN: [id number] DOB: 1945/05/08    DATE OF OPERATION: 05/22/2022  PREOP DIAGNOSIS:    Asymptomatic critical left carotid artery stenosis  POSTOP DIAGNOSIS:    Same  PROCEDURE:    Left carotid endarterectomy  SURGEON: [name]  ASSIST: Dagoberto Ligas  ANESTHESIA: General  EBL: 53ml  INDICATIONS:    Dorvin Krebs is a 77 y.o. male who presented to the outpatient office with carotid imaging demonstrating bilateral critical ICA stenosis.  He was asymptomatic.  My partner, Sherren Mocha Early referred him to me for stage, bilateral carotid revascularization.  Being that he is right-handed, they discussed starting with the left internal carotid artery.  After discussing the risk and benefits, Effrem elected to proceed with carotid endarterectomy.   FINDINGS:    Greater than 80% stenosis of the left internal carotid artery at the ostia  TECHNIQUE:   After full informed written consent was obtained from the patient, the patient was brought back to the operating room and placed supine upon the operating table.  Prior to induction, the patient received IV antibiotics.  After obtaining adequate anesthesia, the patient was placed into semi-Fowler position with a shoulder roll in place and the patient's neck slightly hyperextended and rotated away from the surgical site.    The patient was prepped in the standard fashion for a left carotid endarterectomy.  I made an incision anterior to the sternocleidomastoid muscle and dissected down through the subcutaneous tissue.  The platysmas was opened with electrocautery.  Then I dissected down to the internal jugular vein.  This was dissected posteriorly until I obtained visualization of the common carotid artery.  This was dissected out and then an umbilical tape was placed around the common carotid artery and I loosely applied a Rumel tourniquet.  I then dissected in a periadventitial fashion along the common carotid artery up  to the bifurcation.  I then identified the external carotid artery and the superior thyroid artery.  A 2-0 silk tie was looped around the superior thyroid artery, and I also dissected out the external carotid artery and placed a vessel loop around it.  In continuing the dissection to the internal carotid artery, I identified the facial vein.  This was cephalad, and did not require ligation as the carotid bifurcation was low. In the process of this dissection, the hypoglossal nerve was identified.  I then dissected out the internal carotid artery until I identified an area of soft tissue in the internal carotid artery.  I dissected slightly distal to this area, and placed an umbilical tape around the artery and loosely applied a Rumel tourniquet.    At this point, we gave the patient a therapeutic bolus of Heparin intravenously (roughly 1-0 units/kg) to an ACT greater than 250 for the remained of the case, then I clamped the internal carotid artery, external carotid artery, and then the common carotid artery.  I then made an arteriotomy in the common carotid artery with a 11 blade, and extended the arteriotomy with a Potts scissor down into the common carotid artery, then I carried the arteriotomy through the bifurcation into the internal carotid artery until I reached an area that was not diseased.  At this point, I took the 10 shunt that previously been prepared and I inserted it into the internal carotid artery.  The Rumel tourniquet was then applied to this end of the shunt.  I unclamped the shunt to verify retrograde blood flow in the  internal carotid artery.  I then placed the other end of the shunt into the common carotid artery after unclamping the artery.  The Rumel was tightened down around the shunt.  At this point, I verified blood flow in the shunt with a continuous doppler.  At this point, I started the endarterectomy in the common carotid artery with a Technical brewer and carried this dissection  down into the common carotid artery circumferentially.    Then I transected the plaque at a segment where it was adherent.  I then carried this dissection up into the external carotid artery.  The plaque was extracted by unclamping the external carotid artery and everting the artery.  The dissection was then carried into the internal carotid artery, extracting the remaining portion of the carotid plaque.  I passed the plaque off the field as a specimen.  I then spent the next 30 minutes removing intimal flaps and loose debris.  Eventually I reached the point where the residual plaque was densely adherent and any further dissection would compromise the integrity of the wall.  After verifying that there was no more loose intimal flaps or debris, I re-interrogated the entirety of this carotid artery.  At this point, I was satisfied that the minimal remaining disease was densely adherent to the wall and wall integrity was intact.  At this point, I then fashioned a bovine pericardial patch for the geometry of this artery and sewed it in place with two running stitch of 6-0 Prolene, one from each end.  Prior to completing this patch angioplasty, I removed the shunt first from the internal carotid artery, from which there was excellent backbleeding, and clamped it.  Then I removed the shunt from the common carotid artery, from which there was excellent antegrade bleeding, and then clamped it.  At this point, I allowed the external carotid artery to backbleed, which was excellent.  Then I instilled heparinized saline in this patched artery and then completed the patch angioplasty in the usual fashion.  First, I released the clamp on the external carotid artery, then I released it on the common carotid artery.  After waiting a few seconds, I then released it on the internal carotid artery.  I then interrogated this patient's arteries with the continuous Doppler.  The audible waveforms in each artery were consistent with  the expected characteristics for each artery.  The Sonosite probe was then sterilely draped and used to interrogate the carotid artery in both longitudinal and transverse views.  At this point, I washed out the wound, and placed thrombin and thrombin product throughout.  I also gave the patient 50 mg of protamine to reverse his anticoagulation.   After waiting a few minutes, I removed the thrombin and Gelfoam and washed out the wound.  There was no more active bleeding in the surgical site.   I then reapproximated the platysma muscle with a running stitch of 3-0 Vicryl.  The skin was then reapproximated with a running subcuticular 4-0 Monocryl stitch.  The skin was then cleaned, dried and Dermabond was used to reinforce the skin closure.  The patient woke without any problems, neurologically intact.  05/22/2022, 10:42 AM   Macie Burows, MD [facility] DATE OF DICTATION:   05/22/2022

## 2022-05-22 NOTE — H&P (Addendum)
Patient seen and examined in preop holding.  No complaints. No changes to medication history or physical exam since last seen in clinic. In short, William Hatfield is a 77 year old male with bilateral critical ICA stenosis.  He is right-handed, and therefore he and Dr. Donnetta Hutching discussed left-sided carotid endarterectomy in an effort to improve cerebral perfusion and prevent future risk of stroke. After discussing the risks and benefits of left carotid endarterectomy, William Hatfield elected to proceed.   Pt takes Eliquis, was given 325mg  ASA and 300mg  Plavix this morning for platelet inhibition.  William John MD   [facility]  Patient name: William Hatfield MRN: [id number] DOB: 1946/01/11 Sex: male  REASON FOR VISIT: Discuss CT scan from yesterday of carotid arteries  HPI: William Hatfield is a 77 y.o. male here today for further discussion of evaluation of severe bilateral internal carotid artery stenosis.  I saw him 2 weeks ago with duplex suggesting critical stenoses bilaterally.  He underwent CT angiogram for further evaluation.  He remains asymptomatic.  Past Medical History:  Diagnosis Date   Arthritis    Atrial fibrillation ([place])    a. post op in 2006  b. recurrence on 08/2016 admission for NSTEMI   CAD (coronary artery disease)    a. 2006: s/p CABG at [facility] with LIMA to LAD, SVG to PDA, SVG to OM1, SVG to D1  b. 08/2016: NSTEMI 09/18/16 which showed severe native 3V CAD with patent SVG-->PLA, LIMA-->LAD and total occlusion of SVG--> OM1 with unsuccessful attempt at PCI of SVG--> OM with inability to restore flow.    Carotid artery disease ([facility])    CKD (chronic kidney disease)    Essential hypertension    Myocardial infarction ([place]) 09/18/2016   Presence of permanent cardiac pacemaker    Tremor     Family History  Problem Relation Age of Onset   Heart attack Father    Heart attack Paternal Grandfather     SOCIAL  HISTORY: Social History   Tobacco Use   Smoking status: Former    Packs/day: 4.00    Years: 20.00    Additional pack years: 0.00    Total pack years: 80.00    Types: Cigarettes    Quit date: 02/28/1992    Years since quitting: 30.2   Smokeless tobacco: Former    Types: Snuff, Chew    Quit date: 02/27/1981  Substance Use Topics   Alcohol use: No    Alcohol/week: 0.0 standard drinks of alcohol    Allergies  Allergen Reactions   Niaspan [Niacin] Hives and Itching    NIASPAN ONLY    Current Facility-Administered Medications  Medication Dose Route Frequency Provider Last Rate Last Admin   0.9 %  sodium chloride infusion   Intravenous Continuous William John, MD       aspirin chewable tablet 324 mg  324 mg Oral Once William John, MD       ceFAZolin (ANCEF) IVPB 2g/100 mL premix  2 g Intravenous 30 min Pre-Op William John, MD       Chlorhexidine Gluconate Cloth 2 % PADS 6 each  6 each Topical Once William John, MD       And   Chlorhexidine Gluconate Cloth 2 % PADS 6 each  6 each Topical Once William John, MD       clopidogrel (PLAVIX) tablet 300 mg  300 mg Oral Once William John, MD       lactated  ringers infusion   Intravenous Continuous [name], MD       remifentanil (ULTIVA) 2 mg in 100 mL normal saline (20 mcg/mL) Optime  0.15 mcg/kg/min Intravenous Continuous [name], MD        REVIEW OF SYSTEMS:  [X]  denotes positive finding, [ ]  denotes negative finding Cardiac  Comments:  Chest pain or chest pressure:    Shortness of breath upon exertion:    Short of breath when lying flat:    Irregular heart rhythm:        Vascular    Pain in calf, thigh, or hip brought on by ambulation:    Pain in feet at night that wakes you up from your sleep:     Blood clot in your veins:    Leg swelling:           PHYSICAL EXAM: Vitals:   05/22/22 0549  BP: (!) 150/78  Pulse: 60  Resp: 17  Temp: 98.1 F (36.7 C)  TempSrc: Oral  SpO2: 99%  Weight:  95.3 kg  Height: 5\' 11"  (1.803 m)    GENERAL: The patient is a well-nourished male, in no acute distress. The vital signs are documented above.   CT scan was reviewed with the patient.  This shows a severe calcification and severe stenoses bilaterally.  On the left this is at the bifurcation.  On the right he has moderate to severe stenosis at the bifurcation and severe stenosis in the internal carotid artery 1 cm above the bifurcation.  He has a relatively low bifurcation bilaterally with normal internal carotid before the skull base  MEDICAL ISSUES: I discussed this in detail with the patient.  Explained the risk of severe asymptomatic stenosis.  I have recommended staged bilateral carotid endarterectomies.  He is right-handed and therefore I have recommended dominant hemisphere left endarterectomy initially.  I did review his films with Dr. Virl Cagey in our [place] office who will be doing the surgery.  I offered a outpatient evaluation with Dr. Virl Cagey.  The patient is comfortable meeting him the day of surgery.  Will then see him back in the office for follow-up and plan right endarterectomy after recovery.  I discussed risk of surgery to include risk of stroke at 1 to 1-1/2%    Rosetta Posner, MD FACS [facility] [place] Office Tel [phone]  Note: Portions of this report may have been transcribed using voice recognition software.  Every effort has been made to ensure accuracy; however, inadvertent computerized transcription errors may still be present.

## 2022-05-22 NOTE — Anesthesia Postprocedure Evaluation (Signed)
Anesthesia Post Note  Patient: William Hatfield  Procedure(s) Performed: LEFT CAROTID ENDARTERECTOMY (Left: Neck)     Patient location during evaluation: PACU Anesthesia Type: General Level of consciousness: sedated Pain management: pain level controlled Vital Signs Assessment: post-procedure vital signs reviewed and stable Respiratory status: spontaneous breathing and respiratory function stable Cardiovascular status: stable Postop Assessment: no apparent nausea or vomiting Anesthetic complications: no  No notable events documented.  Last Vitals:  Vitals:   05/22/22 1200 05/22/22 1230  BP: (!) 111/46 (!) 101/54  Pulse: 69 68  Resp: (!) 21 18  Temp: 36.6 C   SpO2: 96% 96%    Last Pain:  Vitals:   05/22/22 1100  TempSrc:   PainSc: Asleep                 Mariafernanda Hendricksen DANIEL

## 2022-05-22 NOTE — Anesthesia Procedure Notes (Addendum)
Procedure Name: Intubation Date/Time: 05/22/2022 7:42 AM  Performed by: Babs Bertin, CRNAPre-anesthesia Checklist: Patient identified, Emergency Drugs available, Suction available and Patient being monitored Patient Re-evaluated:Patient Re-evaluated prior to induction Oxygen Delivery Method: Circle system utilized Preoxygenation: Pre-oxygenation with 100% oxygen Induction Type: IV induction Ventilation: Mask ventilation without difficulty Laryngoscope Size: Mac and 4 Grade View: Grade I Tube type: Oral Tube size: 7.5 mm Number of attempts: 1 Airway Equipment and Method: Stylet, Oral airway and LTA kit utilized Placement Confirmation: ETT inserted through vocal cords under direct vision, positive ETCO2 and breath sounds checked- equal and bilateral Secured at: 22 cm Tube secured with: Tape Dental Injury: Teeth and Oropharynx as per pre-operative assessment

## 2022-05-22 NOTE — Progress Notes (Signed)
Patient arrived at the unit,  CHG bath given,vitals taken, CCMD notified,patient oriented to the unit

## 2022-05-23 ENCOUNTER — Encounter (HOSPITAL_COMMUNITY): Payer: Self-pay | Admitting: Vascular Surgery

## 2022-05-23 LAB — BASIC METABOLIC PANEL
Anion gap: 14 (ref 5–15)
BUN: 28 mg/dL — ABNORMAL HIGH (ref 8–23)
CO2: 24 mmol/L (ref 22–32)
Calcium: 8.6 mg/dL — ABNORMAL LOW (ref 8.9–10.3)
Chloride: 99 mmol/L (ref 98–111)
Creatinine, Ser: 1.55 mg/dL — ABNORMAL HIGH (ref 0.61–1.24)
GFR, Estimated: 46 mL/min — ABNORMAL LOW (ref >60)
Glucose, Bld: 112 mg/dL — ABNORMAL HIGH (ref 70–99)
Potassium: 4.1 mmol/L (ref 3.5–5.1)
Sodium: 137 mmol/L (ref 135–145)

## 2022-05-23 LAB — CBC
HCT: 31.3 % — ABNORMAL LOW (ref 39.0–52.0)
Hemoglobin: 10.7 g/dL — ABNORMAL LOW (ref 13.0–17.0)
MCH: 30.1 pg (ref 26.0–34.0)
MCHC: 34.2 g/dL (ref 30.0–36.0)
MCV: 88.2 fL (ref 80.0–100.0)
Platelets: 194 10*3/uL (ref 150–400)
RBC: 3.55 MIL/uL — ABNORMAL LOW (ref 4.22–5.81)
RDW: 14.5 % (ref 11.5–15.5)
WBC: 16.3 10*3/uL — ABNORMAL HIGH (ref 4.0–10.5)
nRBC: 0 % (ref 0.0–0.2)

## 2022-05-23 MED ORDER — ASPIRIN 81 MG PO TBEC: 81.0000 mg | DELAYED_RELEASE_TABLET | Freq: Every day | ORAL | 12 refills | Status: DC

## 2022-05-23 MED ORDER — OXYCODONE-ACETAMINOPHEN 5-325 MG PO TABS: 1.0000 | ORAL_TABLET | Freq: Four times a day (QID) | ORAL | 0 refills | Status: DC | PRN

## 2022-05-23 NOTE — Progress Notes (Signed)
Remote pacemaker transmission.   

## 2022-05-23 NOTE — Progress Notes (Signed)
  Progress Note    05/23/2022 7:40 AM 1 Day Post-Op  Subjective:  no complaints; severe headache last night but this has resolved   Vitals:   05/23/22 0311 05/23/22 0400  BP: (!) 103/48 (!) 103/48  Pulse: 68 68  Resp: 18 18  Temp: 98.3 F (36.8 C)   SpO2: 97% 97%   Physical Exam: Lungs:  non labored Incisions:  L neck incision c/d/I, no firm hematoma Extremities:  moving all extremities well Neurologic: A&O; CN grossly intact  CBC    Component Value Date/Time   WBC 16.3 (H) 05/23/2022 0445   RBC 3.55 (L) 05/23/2022 0445   HGB 10.7 (L) 05/23/2022 0445   HCT 31.3 (L) 05/23/2022 0445   PLT 194 05/23/2022 0445   MCV 88.2 05/23/2022 0445   MCH 30.1 05/23/2022 0445   MCHC 34.2 05/23/2022 0445   RDW 14.5 05/23/2022 0445   LYMPHSABS 1.6 02/20/2020 1740   MONOABS 0.8 02/20/2020 1740   EOSABS 0.0 02/20/2020 1740   BASOSABS 0.0 02/20/2020 1740    BMET    Component Value Date/Time   NA 137 05/23/2022 0445   K 4.1 05/23/2022 0445   CL 99 05/23/2022 0445   CO2 24 05/23/2022 0445   GLUCOSE 112 (H) 05/23/2022 0445   BUN 28 (H) 05/23/2022 0445   CREATININE 1.55 (H) 05/23/2022 0445   CREATININE 1.38 (H) 10/03/2016 1107   CALCIUM 8.6 (L) 05/23/2022 0445   GFRNONAA 46 (L) 05/23/2022 0445   GFRAA 46 (L) 09/20/2016 0919    INR    Component Value Date/Time   INR 1.2 05/19/2022 1000     Intake/Output Summary (Last 24 hours) at 05/23/2022 0740 Last data filed at 05/23/2022 0000 Gross per 24 hour  Intake 300 ml  Output 50 ml  Net 250 ml     Assessment/Plan:  77 y.o. male is s/p L CEA 1 Day Post-Op   No neuro events overnight Severe headache last night but this has resolved L neck incision is well appearing We will add aspirin to home regimen Ok for discharge this morning.  He will need elective R TCAR due to high grade stenosis.  He will follow up in 2 weeks with Dr. Donnetta Hutching to further discuss TCAR and will also need to be started on plavix   Dagoberto Ligas,  PA-C [facility] [phone] 05/23/2022 7:40 AM  VASCULAR STAFF ADDENDUM: I have independently interviewed and examined the patient. I agree with the above.  Unaware of headache that occurred yesterday. Pt much better this morning. Neuro exam normal Headache 1/10, should it worsen will scan  Discussed importance of BP control.    Cassandria Santee, MD [facility] Phone Number: [phone] 05/23/2022 9:32 AM

## 2022-05-23 NOTE — Discharge Instructions (Signed)
   [facility] [place]  Discharge Instructions   Carotid Endarterectomy (CEA)  Please refer to the following instructions for your post-procedure care. Your surgeon or physician assistant will discuss any changes with you.  Activity  You are encouraged to walk as much as you can. You can slowly return to normal activities but must avoid strenuous activity and heavy lifting until your doctor tell you it's OK. Avoid activities such as vacuuming or swinging a golf club. You can drive after one week if you are comfortable and you are no longer taking prescription pain medications. It is normal to feel tired for serval weeks after your surgery. It is also normal to have difficulty with sleep habits, eating, and bowel movements after surgery. These will go away with time.  Bathing/Showering  You may shower after you come home. Do not soak in a bathtub, hot tub, or swim until the incision heals completely.  Incision Care  Shower every day. Clean your incision with mild soap and water. Pat the area dry with a clean towel. You do not need a bandage unless otherwise instructed. Do not apply any ointments or creams to your incision. You may have skin glue on your incision. Do not peel it off. It will come off on its own in about one week. Your incision may feel thickened and raised for several weeks after your surgery. This is normal and the skin will soften over time. For Men Only: It's OK to shave around the incision but do not shave the incision itself for 2 weeks. It is common to have numbness under your chin that could last for several months.  Diet  Resume your normal diet. There are no special food restrictions following this procedure. A low fat/low cholesterol diet is recommended for all patients with vascular disease. In order to heal from your surgery, it is CRITICAL to get adequate nutrition. Your body requires vitamins, minerals, and protein. Vegetables are the best  source of vitamins and minerals. Vegetables also provide the perfect balance of protein. Processed food has little nutritional value, so try to avoid this.        Medications  Resume taking all of your medications unless your doctor or physician assistant tells you not to. If your incision is causing pain, you may take over-the- counter pain relievers such as acetaminophen (Tylenol). If you were prescribed a stronger pain medication, please be aware these medications can cause nausea and constipation. Prevent nausea by taking the medication with a snack or meal. Avoid constipation by drinking plenty of fluids and eating foods with a high amount of fiber, such as fruits, vegetables, and grains. Do not take Tylenol if you are taking prescription pain medications.  Follow Up  Our office will schedule a follow up appointment 2-3 weeks following discharge.  Please call us immediately for any of the following conditions  Increased pain, redness, drainage (pus) from your incision site. Fever of 101 degrees or higher. If you should develop stroke (slurred speech, difficulty swallowing, weakness on one side of your body, loss of vision) you should call 911 and go to the nearest emergency room.  Reduce your risk of vascular disease:  Stop smoking. If you would like help call [facility] at 1-800-QUIT-NOW (1-800-784-8669) or [place] at 336-586-4000. Manage your cholesterol Maintain a desired weight Control your diabetes Keep your blood pressure down  If you have any questions, please call the office at [phone].   

## 2022-05-23 NOTE — Discharge Summary (Signed)
Discharge Summary     William Hatfield 07/19/1945 77 y.o. male  [id number]  Admission Date: 05/22/2022  Discharge Date: 05/23/22  Physician: Dr. Virl Cagey  Admission Diagnosis: Carotid artery stenosis [I65.29]  Discharge Day services:    See progress note 05/23/22  Hospital Course:  William Hatfield is a 77 year old male who was brought in as an outpatient and underwent left carotid endarterectomy by Dr. Virl Cagey on 05/22/2022.  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  He tolerated the procedure well and was admitted to the hospital postoperatively.  It should be noted that he experienced a headache on the evening of surgery however this resolved overnight.  Postoperative day #1 his neuro exam remained at baseline and he did not experience any neurological events.  Aspirin will be added to his medication regimen at discharge.  He will follow-up with Dr. Donnetta Hutching in office in about 2 weeks for incision check and to discuss revascularization of the asymptomatic high-grade stenosis of the right ICA.  He will require right-sided TCAR.  He will need to be started on Plavix prior to this surgery.  He will be prescribed 1 to 2 days of narcotic pain medication for continued postoperative pain control.  He was discharged home in stable condition.   Recent Labs    05/23/22 0445  NA 137  K 4.1  CL 99  CO2 24  GLUCOSE 112*  BUN 28*  CALCIUM 8.6*   Recent Labs    05/23/22 0445  WBC 16.3*  HGB 10.7*  HCT 31.3*  PLT 194   No results for input(s): "INR" in the last 72 hours.     Discharge Diagnosis:  Carotid artery stenosis [I65.29]  Secondary Diagnosis: Patient Active Problem List   Diagnosis Date Noted   Carotid artery stenosis 05/22/2022   PAF (paroxysmal atrial fibrillation) ([place]) 09/20/2016   Non-ST elevation (NSTEMI) myocardial infarction ([place]) 09/20/2016   CKD (chronic kidney disease)    Dyslipidemia 08/01/2013   Carotid artery disease (HCC)     Tremor    Hypertension    CAD (coronary artery disease)    Hx of CABG    Past Medical History:  Diagnosis Date   Arthritis    Atrial fibrillation ([place])    a. post op in 2006  b. recurrence on 08/2016 admission for NSTEMI   CAD (coronary artery disease)    a. 2006: s/p CABG at [facility] with LIMA to LAD, SVG to PDA, SVG to OM1, SVG to D1  b. 08/2016: NSTEMI 09/18/16 which showed severe native 3V CAD with patent SVG-->PLA, LIMA-->LAD and total occlusion of SVG--> OM1 with unsuccessful attempt at PCI of SVG--> OM with inability to restore flow.    Carotid artery disease (HCC)    CKD (chronic kidney disease)    Essential hypertension    Myocardial infarction ([place]) 09/18/2016   Presence of permanent cardiac pacemaker    Tremor     Allergies as of 05/23/2022       Reactions   Niaspan [niacin] Hives, Itching   NIASPAN ONLY        Medication List     TAKE these medications    acetaminophen 500 MG tablet Commonly known as: TYLENOL Take 1,000 mg by mouth every 6 (six) hours as needed for moderate pain or headache.   allopurinol 100 MG tablet Commonly known as: ZYLOPRIM Take 100 mg by mouth daily.   apixaban 5 MG Tabs tablet Commonly known as: Eliquis Take 1 tablet (5  mg total) by mouth 2 (two) times daily.   aspirin EC 81 MG tablet Take 1 tablet (81 mg total) by mouth daily at 6 (six) AM. Swallow whole. Start taking on: May 24, 2022   atorvastatin 40 MG tablet Commonly known as: LIPITOR TAKE 1 TABLET EVERY EVENING   CoQ-10 100 MG Caps Take 100 mg by mouth daily.   cyanocobalamin 500 MCG tablet Commonly known as: VITAMIN B12 Take 500 mcg by mouth daily.   empagliflozin 10 MG Tabs tablet Commonly known as: JARDIANCE Take 1 tablet (10 mg total) by mouth daily before breakfast.   fluticasone 50 MCG/ACT nasal spray Commonly known as: FLONASE Place 1 spray into both nostrils daily as needed for allergies or rhinitis.   furosemide 40 MG tablet Commonly known as:  Lasix Take 1 tablet (40 mg total) by mouth daily.   losartan 50 MG tablet Commonly known as: COZAAR Take 1 tablet (50 mg total) by mouth daily.   metoprolol tartrate 25 MG tablet Commonly known as: LOPRESSOR TAKE 1 TABLET TWICE DAILY   multivitamin tablet Take 1 tablet by mouth daily.   nitroGLYCERIN 0.4 MG SL tablet Commonly known as: NITROSTAT Place 1 tablet (0.4 mg total) under the tongue every 5 (five) minutes x 3 doses as needed for chest pain.   oxyCODONE-acetaminophen 5-325 MG tablet Commonly known as: PERCOCET/ROXICET Take 1 tablet by mouth every 6 (six) hours as needed for moderate pain.         Discharge Instructions:   [facility] Discharge Instructions Carotid Endarterectomy (CEA)  Please refer to the following instructions for your post-procedure care. Your surgeon or physician assistant will discuss any changes with you.  Activity  You are encouraged to walk as much as you can. You can slowly return to normal activities but must avoid strenuous activity and heavy lifting until your doctor tell you it's OK. Avoid activities such as vacuuming or swinging a golf club. You can drive after one week if you are comfortable and you are no longer taking prescription pain medications. It is normal to feel tired for serval weeks after your surgery. It is also normal to have difficulty with sleep habits, eating, and bowel movements after surgery. These will go away with time.  Bathing/Showering  You may shower after you come home. Do not soak in a bathtub, hot tub, or swim until the incision heals completely.  Incision Care  Shower every day. Clean your incision with mild soap and water. Pat the area dry with a clean towel. You do not need a bandage unless otherwise instructed. Do not apply any ointments or creams to your incision. You may have skin glue on your incision. Do not peel it off. It will come off on its own in about one week.  Your incision may feel thickened and raised for several weeks after your surgery. This is normal and the skin will soften over time. For Men Only: It's OK to shave around the incision but do not shave the incision itself for 2 weeks. It is common to have numbness under your chin that could last for several months.  Diet  Resume your normal diet. There are no special food restrictions following this procedure. A low fat/low cholesterol diet is recommended for all patients with vascular disease. In order to heal from your surgery, it is CRITICAL to get adequate nutrition. Your body requires vitamins, minerals, and protein. Vegetables are the best source of vitamins and minerals. Vegetables  also provide the perfect balance of protein. Processed food has little nutritional value, so try to avoid this.  Medications  Resume taking all of your medications unless your doctor or physician assistant tells you not to.  If your incision is causing pain, you may take over-the- counter pain relievers such as acetaminophen (Tylenol). If you were prescribed a stronger pain medication, please be aware these medications can cause nausea and constipation.  Prevent nausea by taking the medication with a snack or meal. Avoid constipation by drinking plenty of fluids and eating foods with a high amount of fiber, such as fruits, vegetables, and grains. Do not take Tylenol if you are taking prescription pain medications.  Follow Up  Our office will schedule a follow up appointment 2-3 weeks following discharge.  Please call us immediately for any of the following conditions  Increased pain, redness, drainage (pus) from your incision site. Fever of 101 degrees or higher. If you should develop stroke (slurred speech, difficulty swallowing, weakness on one side of your body, loss of vision) you should call 911 and go to the nearest emergency room.  Reduce your risk of vascular disease:  Stop smoking. If you would like  help call [facility] at 1-800-QUIT-NOW [phone]) or [place] at ([phone])[phone]. Manage your cholesterol Maintain a desired weight Control your diabetes Keep your blood pressure down  If you have any questions, please call the office at [phone].  Disposition: home  Patient's condition: is Good  Follow up: 1. Dr. Donnetta Hutching in 2 weeks.   Dagoberto Ligas, PA-C [facility] [phone]   --- For [facility] Registry use ---   Modified Rankin score at D/C (0-6): 0  IV medication needed for:  1. Hypertension: No 2. Hypotension: No  Post-op Complications: No  1. Post-op CVA or TIA: No  If yes: Event classification (right eye, left eye, right cortical, left cortical, verterobasilar, other):   If yes: Timing of event (intra-op, <6 hrs post-op, >=6 hrs post-op, unknown):   2. CN injury: No  If yes: CN  injuried   3. Myocardial infarction: No  If yes: Dx by (EKG or clinical, Troponin):   4.  CHF: No  5.  Dysrhythmia (new): No  6. Wound infection: No  7. Reperfusion symptoms: No  8. Return to OR: No  If yes: return to OR for (bleeding, neurologic, other CEA incision, other):   Discharge medications: Statin use:  Yes ASA use:  Yes   Beta blocker use:  Yes ACE-Inhibitor use:  No  ARB use:  Yes CCB use: No P2Y12 Antagonist use: No, [ ]  Plavix, [ ]  Plasugrel, [ ]  Ticlopinine, [ ]  Ticagrelor, [ ]  Other, [ ]  No for medical reason, [ ]  Non-compliant, [ ]  Not-indicated Anti-coagulant use:  Yes, [ ]  Warfarin, [ ]  Rivaroxaban, [ ]  Dabigatran, [x]  Eliquis

## 2022-05-23 NOTE — Progress Notes (Incomplete Revision)
  Progress Note    05/23/2022 7:40 AM 1 Day Post-Op  Subjective:  no complaints; severe headache last night but this has resolved   Vitals:   05/23/22 0311 05/23/22 0400  BP: (!) 103/48 (!) 103/48  Pulse: 68 68  Resp: 18 18  Temp: 98.3 F (36.8 C)   SpO2: 97% 97%   Physical Exam: Lungs:  non labored Incisions:  L neck incision c/d/I, no firm hematoma Extremities:  moving all extremities well Neurologic: A&O; CN grossly intact  CBC    Component Value Date/Time   WBC 16.3 (H) 05/23/2022 0445   RBC 3.55 (L) 05/23/2022 0445   HGB 10.7 (L) 05/23/2022 0445   HCT 31.3 (L) 05/23/2022 0445   PLT 194 05/23/2022 0445   MCV 88.2 05/23/2022 0445   MCH 30.1 05/23/2022 0445   MCHC 34.2 05/23/2022 0445   RDW 14.5 05/23/2022 0445   LYMPHSABS 1.6 02/20/2020 1740   MONOABS 0.8 02/20/2020 1740   EOSABS 0.0 02/20/2020 1740   BASOSABS 0.0 02/20/2020 1740    BMET    Component Value Date/Time   NA 137 05/23/2022 0445   K 4.1 05/23/2022 0445   CL 99 05/23/2022 0445   CO2 24 05/23/2022 0445   GLUCOSE 112 (H) 05/23/2022 0445   BUN 28 (H) 05/23/2022 0445   CREATININE 1.55 (H) 05/23/2022 0445   CREATININE 1.38 (H) 10/03/2016 1107   CALCIUM 8.6 (L) 05/23/2022 0445   GFRNONAA 46 (L) 05/23/2022 0445   GFRAA 46 (L) 09/20/2016 0919    INR    Component Value Date/Time   INR 1.2 05/19/2022 1000     Intake/Output Summary (Last 24 hours) at 05/23/2022 0740 Last data filed at 05/23/2022 0000 Gross per 24 hour  Intake 300 ml  Output 50 ml  Net 250 ml     Assessment/Plan:  77 y.o. male is s/p L CEA 1 Day Post-Op   No neuro events overnight Severe headache last night but this has resolved L neck incision is well appearing We will add aspirin to home regimen Ok for discharge this morning.  He will need elective R TCAR due to high grade stenosis.  He will follow up in 2 weeks with Dr. Donnetta Hutching to further discuss TCAR and will also need to be started on plavix   Dagoberto Ligas,  PA-C [facility] [phone] 05/23/2022 7:40 AM  VASCULAR STAFF ADDENDUM: I have independently interviewed and examined the patient. I agree with the above.  Unaware of headache that occurred yesterday. Pt much better this morning. Neuro exam normal Headache 1/10, should it worsen will scan  Discussed importance of BP control.    Cassandria Santee, MD [facility] Phone Number: [phone] 05/23/2022 9:32 AM

## 2022-05-26 ENCOUNTER — Other Ambulatory Visit: Payer: Self-pay

## 2022-05-26 ENCOUNTER — Inpatient Hospital Stay (HOSPITAL_COMMUNITY)
Admission: EM | Admit: 2022-05-26 | Discharge: 2022-05-29 | DRG: 908 | Disposition: A | Discharge status: Home / Self Care | Payer: Medicare Other | Attending: Surgery | Admitting: Surgery

## 2022-05-26 ENCOUNTER — Encounter (HOSPITAL_COMMUNITY): Payer: Self-pay

## 2022-05-26 ENCOUNTER — Emergency Department (HOSPITAL_COMMUNITY): Payer: Medicare Other

## 2022-05-26 ENCOUNTER — Emergency Department (HOSPITAL_COMMUNITY): Payer: Medicare Other | Admitting: Anesthesiology

## 2022-05-26 ENCOUNTER — Encounter (HOSPITAL_COMMUNITY)
Admission: EM | Disposition: A | Discharge status: Home / Self Care | Payer: Self-pay | Source: Home / Self Care | Attending: Surgery

## 2022-05-26 DIAGNOSIS — M1990 Unspecified osteoarthritis, unspecified site: Secondary | ICD-10-CM | POA: Diagnosis present

## 2022-05-26 DIAGNOSIS — I6521 Occlusion and stenosis of right carotid artery: Secondary | ICD-10-CM | POA: Diagnosis present

## 2022-05-26 DIAGNOSIS — Z888 Allergy status to other drugs, medicaments and biological substances status: Secondary | ICD-10-CM

## 2022-05-26 DIAGNOSIS — Z87891 Personal history of nicotine dependence: Secondary | ICD-10-CM | POA: Diagnosis not present

## 2022-05-26 DIAGNOSIS — I97638 Postprocedural hematoma of a circulatory system organ or structure following other circulatory system procedure: Secondary | ICD-10-CM

## 2022-05-26 DIAGNOSIS — Z951 Presence of aortocoronary bypass graft: Secondary | ICD-10-CM | POA: Diagnosis not present

## 2022-05-26 DIAGNOSIS — I252 Old myocardial infarction: Secondary | ICD-10-CM | POA: Diagnosis not present

## 2022-05-26 DIAGNOSIS — I2510 Atherosclerotic heart disease of native coronary artery without angina pectoris: Secondary | ICD-10-CM

## 2022-05-26 DIAGNOSIS — S1093XA Contusion of unspecified part of neck, initial encounter: Secondary | ICD-10-CM | POA: Diagnosis present

## 2022-05-26 DIAGNOSIS — D6832 Hemorrhagic disorder due to extrinsic circulating anticoagulants: Secondary | ICD-10-CM | POA: Diagnosis present

## 2022-05-26 DIAGNOSIS — S1083XA Contusion of other specified part of neck, initial encounter: Secondary | ICD-10-CM | POA: Diagnosis present

## 2022-05-26 DIAGNOSIS — T45515A Adverse effect of anticoagulants, initial encounter: Secondary | ICD-10-CM | POA: Diagnosis present

## 2022-05-26 DIAGNOSIS — Z7984 Long term (current) use of oral hypoglycemic drugs: Secondary | ICD-10-CM

## 2022-05-26 DIAGNOSIS — Z7902 Long term (current) use of antithrombotics/antiplatelets: Secondary | ICD-10-CM | POA: Diagnosis not present

## 2022-05-26 DIAGNOSIS — Z79899 Other long term (current) drug therapy: Secondary | ICD-10-CM | POA: Diagnosis not present

## 2022-05-26 DIAGNOSIS — L7632 Postprocedural hematoma of skin and subcutaneous tissue following other procedure: Principal | ICD-10-CM | POA: Diagnosis present

## 2022-05-26 DIAGNOSIS — I9789 Other postprocedural complications and disorders of the circulatory system, not elsewhere classified: Secondary | ICD-10-CM | POA: Diagnosis present

## 2022-05-26 DIAGNOSIS — Z981 Arthrodesis status: Secondary | ICD-10-CM

## 2022-05-26 DIAGNOSIS — Z7901 Long term (current) use of anticoagulants: Secondary | ICD-10-CM | POA: Diagnosis not present

## 2022-05-26 DIAGNOSIS — I10 Essential (primary) hypertension: Secondary | ICD-10-CM | POA: Diagnosis present

## 2022-05-26 DIAGNOSIS — I4891 Unspecified atrial fibrillation: Secondary | ICD-10-CM | POA: Diagnosis not present

## 2022-05-26 DIAGNOSIS — Z8249 Family history of ischemic heart disease and other diseases of the circulatory system: Secondary | ICD-10-CM

## 2022-05-26 DIAGNOSIS — Z950 Presence of cardiac pacemaker: Secondary | ICD-10-CM

## 2022-05-26 DIAGNOSIS — Y838 Other surgical procedures as the cause of abnormal reaction of the patient, or of later complication, without mention of misadventure at the time of the procedure: Secondary | ICD-10-CM | POA: Diagnosis present

## 2022-05-26 HISTORY — PX: ENDARTERECTOMY: SHX5162

## 2022-05-26 LAB — PROTIME-INR
INR: 1.4 — ABNORMAL HIGH (ref 0.8–1.2)
Prothrombin Time: 17.2 seconds — ABNORMAL HIGH (ref 11.4–15.2)

## 2022-05-26 LAB — I-STAT CHEM 8, ED
BUN: 28 mg/dL — ABNORMAL HIGH (ref 8–23)
Calcium, Ion: 1.17 mmol/L (ref 1.15–1.40)
Chloride: 103 mmol/L (ref 98–111)
Creatinine, Ser: 1.3 mg/dL — ABNORMAL HIGH (ref 0.61–1.24)
Glucose, Bld: 158 mg/dL — ABNORMAL HIGH (ref 70–99)
HCT: 24 % — ABNORMAL LOW (ref 39.0–52.0)
Hemoglobin: 8.2 g/dL — ABNORMAL LOW (ref 13.0–17.0)
Potassium: 3.5 mmol/L (ref 3.5–5.1)
Sodium: 139 mmol/L (ref 135–145)
TCO2: 25 mmol/L (ref 22–32)

## 2022-05-26 LAB — CBC
HCT: 36.1 % — ABNORMAL LOW (ref 39.0–52.0)
Hemoglobin: 12.1 g/dL — ABNORMAL LOW (ref 13.0–17.0)
MCH: 29.8 pg (ref 26.0–34.0)
MCHC: 33.5 g/dL (ref 30.0–36.0)
MCV: 88.9 fL (ref 80.0–100.0)
Platelets: 201 10*3/uL (ref 150–400)
RBC: 4.06 MIL/uL — ABNORMAL LOW (ref 4.22–5.81)
RDW: 14.8 % (ref 11.5–15.5)
WBC: 9.4 10*3/uL (ref 4.0–10.5)
nRBC: 0 % (ref 0.0–0.2)

## 2022-05-26 LAB — BASIC METABOLIC PANEL
Anion gap: 13 (ref 5–15)
BUN: 29 mg/dL — ABNORMAL HIGH (ref 8–23)
CO2: 25 mmol/L (ref 22–32)
Calcium: 9 mg/dL (ref 8.9–10.3)
Chloride: 101 mmol/L (ref 98–111)
Creatinine, Ser: 1.33 mg/dL — ABNORMAL HIGH (ref 0.61–1.24)
GFR, Estimated: 56 mL/min — ABNORMAL LOW (ref >60)
Glucose, Bld: 158 mg/dL — ABNORMAL HIGH (ref 70–99)
Potassium: 3.5 mmol/L (ref 3.5–5.1)
Sodium: 139 mmol/L (ref 135–145)

## 2022-05-26 SURGERY — ENDARTERECTOMY, CAROTID
Anesthesia: General | Site: Neck | Laterality: Left

## 2022-05-26 MED ORDER — ONDANSETRON HCL 4 MG/2ML IJ SOLN
INTRAMUSCULAR | Status: AC
  Filled 2022-05-26: qty 2

## 2022-05-26 MED ORDER — PHENYLEPHRINE 80 MCG/ML (10ML) SYRINGE FOR IV PUSH (FOR BLOOD PRESSURE SUPPORT)
PREFILLED_SYRINGE | INTRAVENOUS | Status: DC | PRN
  Administered 2022-05-26: 160 ug via INTRAVENOUS
  Administered 2022-05-26: 80 ug via INTRAVENOUS

## 2022-05-26 MED ORDER — CEFAZOLIN SODIUM-DEXTROSE 2-3 GM-%(50ML) IV SOLR
INTRAVENOUS | Status: DC | PRN
  Administered 2022-05-26: 2 g via INTRAVENOUS

## 2022-05-26 MED ORDER — SODIUM CHLORIDE 0.9 % IV SOLN: 500.0000 mL | Freq: Once | INTRAVENOUS | Status: DC | PRN

## 2022-05-26 MED ORDER — OXYCODONE-ACETAMINOPHEN 5-325 MG PO TABS: 1.0000 | ORAL_TABLET | ORAL | Status: DC | PRN

## 2022-05-26 MED ORDER — POTASSIUM CHLORIDE CRYS ER 20 MEQ PO TBCR: 20.0000 meq | EXTENDED_RELEASE_TABLET | Freq: Every day | ORAL | Status: DC | PRN

## 2022-05-26 MED ORDER — HYDRALAZINE HCL 20 MG/ML IJ SOLN
5.0000 mg | INTRAMUSCULAR | Status: DC | PRN
  Administered 2022-05-26: 5 mg via INTRAVENOUS

## 2022-05-26 MED ORDER — OXYCODONE HCL 5 MG/5ML PO SOLN: 5.0000 mg | Freq: Once | ORAL | Status: AC | PRN

## 2022-05-26 MED ORDER — OXYCODONE HCL 5 MG PO TABS
5.0000 mg | ORAL_TABLET | Freq: Once | ORAL | Status: AC | PRN
  Administered 2022-05-26: 5 mg via ORAL

## 2022-05-26 MED ORDER — PROTHROMBIN COMPLEX CONC HUMAN 500 UNITS IV KIT
2262.0000 [IU] | PACK | Status: AC
  Administered 2022-05-26: 2262 [IU] via INTRAVENOUS
  Filled 2022-05-26: qty 2262

## 2022-05-26 MED ORDER — ASPIRIN 81 MG PO TBEC
81.0000 mg | DELAYED_RELEASE_TABLET | Freq: Every day | ORAL | Status: DC
  Administered 2022-05-28 – 2022-05-29 (×2): 81 mg via ORAL
  Filled 2022-05-26 (×3): qty 1

## 2022-05-26 MED ORDER — HYDRALAZINE HCL 20 MG/ML IJ SOLN
INTRAMUSCULAR | Status: AC
  Filled 2022-05-26: qty 1

## 2022-05-26 MED ORDER — ATORVASTATIN CALCIUM 40 MG PO TABS
40.0000 mg | ORAL_TABLET | Freq: Every evening | ORAL | Status: DC
  Administered 2022-05-26 – 2022-05-28 (×3): 40 mg via ORAL
  Filled 2022-05-26 (×3): qty 1

## 2022-05-26 MED ORDER — LACTATED RINGERS IV SOLN: INTRAVENOUS | Status: DC

## 2022-05-26 MED ORDER — ORAL CARE MOUTH RINSE: 15.0000 mL | Freq: Once | OROMUCOSAL | Status: AC

## 2022-05-26 MED ORDER — SUCCINYLCHOLINE CHLORIDE 200 MG/10ML IV SOSY
PREFILLED_SYRINGE | INTRAVENOUS | Status: DC | PRN
  Administered 2022-05-26: 140 mg via INTRAVENOUS

## 2022-05-26 MED ORDER — CHLORHEXIDINE GLUCONATE 0.12 % MT SOLN: 15.0000 mL | Freq: Once | OROMUCOSAL | Status: AC

## 2022-05-26 MED ORDER — 0.9 % SODIUM CHLORIDE (POUR BTL) OPTIME
TOPICAL | Status: DC | PRN
  Administered 2022-05-26: 1000 mL

## 2022-05-26 MED ORDER — METOPROLOL TARTRATE 12.5 MG HALF TABLET
25.0000 mg | ORAL_TABLET | Freq: Once | ORAL | Status: AC
  Administered 2022-05-26: 25 mg via ORAL

## 2022-05-26 MED ORDER — ROCURONIUM BROMIDE 50 MG/5ML IV SOSY
PREFILLED_SYRINGE | INTRAVENOUS | Status: DC | PRN
  Administered 2022-05-26: 50 mg via INTRAVENOUS

## 2022-05-26 MED ORDER — ALLOPURINOL 100 MG PO TABS
100.0000 mg | ORAL_TABLET | Freq: Every day | ORAL | Status: DC
  Administered 2022-05-26 – 2022-05-29 (×4): 100 mg via ORAL
  Filled 2022-05-26 (×4): qty 1

## 2022-05-26 MED ORDER — LIDOCAINE 2% (20 MG/ML) 5 ML SYRINGE
INTRAMUSCULAR | Status: AC
  Filled 2022-05-26: qty 5

## 2022-05-26 MED ORDER — LABETALOL HCL 5 MG/ML IV SOLN
10.0000 mg | INTRAVENOUS | Status: DC | PRN
  Administered 2022-05-26: 10 mg via INTRAVENOUS

## 2022-05-26 MED ORDER — SUCCINYLCHOLINE CHLORIDE 200 MG/10ML IV SOSY
PREFILLED_SYRINGE | INTRAVENOUS | Status: AC
  Filled 2022-05-26: qty 10

## 2022-05-26 MED ORDER — METOPROLOL TARTRATE 12.5 MG HALF TABLET
ORAL_TABLET | ORAL | Status: AC
  Filled 2022-05-26: qty 2

## 2022-05-26 MED ORDER — SODIUM CHLORIDE 0.9 % IV SOLN: INTRAVENOUS | Status: AC

## 2022-05-26 MED ORDER — FENTANYL CITRATE (PF) 100 MCG/2ML IJ SOLN
INTRAMUSCULAR | Status: AC
  Filled 2022-05-26: qty 2

## 2022-05-26 MED ORDER — IOHEXOL 350 MG/ML SOLN
75.0000 mL | Freq: Once | INTRAVENOUS | Status: AC | PRN
  Administered 2022-05-26: 75 mL via INTRAVENOUS

## 2022-05-26 MED ORDER — COQ-10 100 MG PO CAPS: 100.0000 mg | ORAL_CAPSULE | Freq: Every day | ORAL | Status: DC

## 2022-05-26 MED ORDER — FUROSEMIDE 40 MG PO TABS
40.0000 mg | ORAL_TABLET | Freq: Every day | ORAL | Status: DC
  Administered 2022-05-26 – 2022-05-29 (×4): 40 mg via ORAL
  Filled 2022-05-26 (×4): qty 1

## 2022-05-26 MED ORDER — ONDANSETRON HCL 4 MG/2ML IJ SOLN: 4.0000 mg | Freq: Four times a day (QID) | INTRAMUSCULAR | Status: DC | PRN

## 2022-05-26 MED ORDER — METOPROLOL TARTRATE 25 MG PO TABS
25.0000 mg | ORAL_TABLET | Freq: Two times a day (BID) | ORAL | Status: DC
  Administered 2022-05-27 – 2022-05-29 (×5): 25 mg via ORAL
  Filled 2022-05-26 (×6): qty 1

## 2022-05-26 MED ORDER — MIDAZOLAM HCL 2 MG/2ML IJ SOLN: 0.5000 mg | Freq: Once | INTRAMUSCULAR | Status: DC | PRN

## 2022-05-26 MED ORDER — PANTOPRAZOLE SODIUM 40 MG PO TBEC
40.0000 mg | DELAYED_RELEASE_TABLET | Freq: Every day | ORAL | Status: DC
  Administered 2022-05-26 – 2022-05-29 (×4): 40 mg via ORAL
  Filled 2022-05-26 (×4): qty 1

## 2022-05-26 MED ORDER — DEXAMETHASONE SODIUM PHOSPHATE 10 MG/ML IJ SOLN
INTRAMUSCULAR | Status: AC
  Filled 2022-05-26: qty 1

## 2022-05-26 MED ORDER — CHLORHEXIDINE GLUCONATE 0.12 % MT SOLN
OROMUCOSAL | Status: AC
  Administered 2022-05-26: 15 mL via OROMUCOSAL
  Filled 2022-05-26: qty 15

## 2022-05-26 MED ORDER — PHENYLEPHRINE 80 MCG/ML (10ML) SYRINGE FOR IV PUSH (FOR BLOOD PRESSURE SUPPORT)
PREFILLED_SYRINGE | INTRAVENOUS | Status: AC
  Filled 2022-05-26: qty 10

## 2022-05-26 MED ORDER — LABETALOL HCL 5 MG/ML IV SOLN
INTRAVENOUS | Status: AC
  Filled 2022-05-26: qty 4

## 2022-05-26 MED ORDER — DOCUSATE SODIUM 100 MG PO CAPS
100.0000 mg | ORAL_CAPSULE | Freq: Every day | ORAL | Status: DC
  Administered 2022-05-27 – 2022-05-29 (×3): 100 mg via ORAL
  Filled 2022-05-26 (×3): qty 1

## 2022-05-26 MED ORDER — ONDANSETRON HCL 4 MG/2ML IJ SOLN
INTRAMUSCULAR | Status: DC | PRN
  Administered 2022-05-26: 4 mg via INTRAVENOUS

## 2022-05-26 MED ORDER — PROMETHAZINE HCL 25 MG/ML IJ SOLN: 6.2500 mg | INTRAMUSCULAR | Status: DC | PRN

## 2022-05-26 MED ORDER — FENTANYL CITRATE (PF) 100 MCG/2ML IJ SOLN
INTRAMUSCULAR | Status: DC | PRN
  Administered 2022-05-26 (×2): 50 ug via INTRAVENOUS

## 2022-05-26 MED ORDER — OXYCODONE HCL 5 MG PO TABS
ORAL_TABLET | ORAL | Status: AC
  Filled 2022-05-26: qty 1

## 2022-05-26 MED ORDER — CEFAZOLIN SODIUM-DEXTROSE 2-4 GM/100ML-% IV SOLN
INTRAVENOUS | Status: AC
  Filled 2022-05-26: qty 100

## 2022-05-26 MED ORDER — HYDROMORPHONE HCL 1 MG/ML IJ SOLN: 0.5000 mg | INTRAMUSCULAR | Status: DC | PRN

## 2022-05-26 MED ORDER — FENTANYL CITRATE (PF) 250 MCG/5ML IJ SOLN
INTRAMUSCULAR | Status: AC
  Filled 2022-05-26: qty 5

## 2022-05-26 MED ORDER — PHENYLEPHRINE HCL-NACL 20-0.9 MG/250ML-% IV SOLN
INTRAVENOUS | Status: DC | PRN
  Administered 2022-05-26: 30 ug/min via INTRAVENOUS

## 2022-05-26 MED ORDER — ACETAMINOPHEN 650 MG RE SUPP: 325.0000 mg | RECTAL | Status: DC | PRN

## 2022-05-26 MED ORDER — PHENOL 1.4 % MT LIQD: 1.0000 | OROMUCOSAL | Status: DC | PRN

## 2022-05-26 MED ORDER — ACETAMINOPHEN 325 MG PO TABS
325.0000 mg | ORAL_TABLET | ORAL | Status: DC | PRN
  Administered 2022-05-26 – 2022-05-27 (×2): 650 mg via ORAL
  Administered 2022-05-28: 325 mg via ORAL
  Filled 2022-05-26 (×2): qty 2
  Filled 2022-05-26: qty 1
  Filled 2022-05-26: qty 2

## 2022-05-26 MED ORDER — CEFAZOLIN SODIUM-DEXTROSE 2-4 GM/100ML-% IV SOLN
2.0000 g | Freq: Three times a day (TID) | INTRAVENOUS | Status: DC
  Administered 2022-05-26 – 2022-05-29 (×8): 2 g via INTRAVENOUS
  Filled 2022-05-26 (×8): qty 100

## 2022-05-26 MED ORDER — ALUM & MAG HYDROXIDE-SIMETH 200-200-20 MG/5ML PO SUSP: 15.0000 mL | ORAL | Status: DC | PRN

## 2022-05-26 MED ORDER — GUAIFENESIN-DM 100-10 MG/5ML PO SYRP: 15.0000 mL | ORAL_SOLUTION | ORAL | Status: DC | PRN

## 2022-05-26 MED ORDER — METOPROLOL TARTRATE 5 MG/5ML IV SOLN: 2.0000 mg | INTRAVENOUS | Status: DC | PRN

## 2022-05-26 MED ORDER — MAGNESIUM SULFATE 2 GM/50ML IV SOLN: 2.0000 g | Freq: Every day | INTRAVENOUS | Status: DC | PRN

## 2022-05-26 MED ORDER — LOSARTAN POTASSIUM 50 MG PO TABS
50.0000 mg | ORAL_TABLET | Freq: Every day | ORAL | Status: DC
  Administered 2022-05-26 – 2022-05-29 (×4): 50 mg via ORAL
  Filled 2022-05-26 (×4): qty 1

## 2022-05-26 MED ORDER — DEXAMETHASONE SODIUM PHOSPHATE 10 MG/ML IJ SOLN
INTRAMUSCULAR | Status: DC | PRN
  Administered 2022-05-26: 10 mg via INTRAVENOUS

## 2022-05-26 MED ORDER — PROPOFOL 10 MG/ML IV BOLUS
INTRAVENOUS | Status: DC | PRN
  Administered 2022-05-26: 150 mg via INTRAVENOUS

## 2022-05-26 MED ORDER — FENTANYL CITRATE (PF) 100 MCG/2ML IJ SOLN
25.0000 ug | INTRAMUSCULAR | Status: DC | PRN
  Administered 2022-05-26 (×2): 50 ug via INTRAVENOUS

## 2022-05-26 MED ORDER — LACTATED RINGERS IV BOLUS: 250.0000 mL | Freq: Once | INTRAVENOUS | Status: DC

## 2022-05-26 MED ORDER — LIDOCAINE 2% (20 MG/ML) 5 ML SYRINGE
INTRAMUSCULAR | Status: DC | PRN
  Administered 2022-05-26: 30 mg via INTRAVENOUS

## 2022-05-26 SURGICAL SUPPLY — 31 items
BAG COUNTER SPONGE SURGICOUNT (BAG) ×1 IMPLANT
BIOPATCH RED 1 DISK 7.0 (GAUZE/BANDAGES/DRESSINGS) IMPLANT
CANISTER SUCT 3000ML PPV (MISCELLANEOUS) ×1 IMPLANT
CLIP TI MEDIUM 6 (CLIP) ×1 IMPLANT
CLIP TI WIDE RED SMALL 6 (CLIP) ×1 IMPLANT
DERMABOND ADVANCED .7 DNX12 (GAUZE/BANDAGES/DRESSINGS) ×1 IMPLANT
DRAIN CHANNEL 15F RND FF W/TCR (WOUND CARE) IMPLANT
DRAPE ORTHO SPLIT 77X108 STRL (DRAPES) ×2
DRAPE SURG ORHT 6 SPLT 77X108 (DRAPES) IMPLANT
ELECT REM PT RETURN 9FT ADLT (ELECTROSURGICAL) ×1
ELECTRODE REM PT RTRN 9FT ADLT (ELECTROSURGICAL) ×1 IMPLANT
EVACUATOR SILICONE 100CC (DRAIN) IMPLANT
GLOVE SURG SS PI 7.5 STRL IVOR (GLOVE) ×3 IMPLANT
GOWN STRL REUS W/ TWL LRG LVL3 (GOWN DISPOSABLE) ×2 IMPLANT
GOWN STRL REUS W/ TWL XL LVL3 (GOWN DISPOSABLE) ×1 IMPLANT
GOWN STRL REUS W/TWL LRG LVL3 (GOWN DISPOSABLE) ×2
GOWN STRL REUS W/TWL XL LVL3 (GOWN DISPOSABLE) ×1
KIT BASIN OR (CUSTOM PROCEDURE TRAY) ×1 IMPLANT
KIT TURNOVER KIT B (KITS) ×1 IMPLANT
NS IRRIG 1000ML POUR BTL (IV SOLUTION) ×3 IMPLANT
PAD ARMBOARD 7.5X6 YLW CONV (MISCELLANEOUS) ×2 IMPLANT
POSITIONER HEAD DONUT 9IN (MISCELLANEOUS) ×1 IMPLANT
SURGIFLO W/THROMBIN 8M KIT (HEMOSTASIS) IMPLANT
SUT ETHILON 3 0 PS 1 (SUTURE) IMPLANT
SUT MNCRL AB 4-0 PS2 18 (SUTURE) IMPLANT
SUT PROLENE 6 0 BV (SUTURE) ×2 IMPLANT
SUT VIC AB 3-0 SH 27 (SUTURE) ×1
SUT VIC AB 3-0 SH 27X BRD (SUTURE) ×2 IMPLANT
SUT VIC AB 3-0 X1 27 (SUTURE) ×1 IMPLANT
TOWEL GREEN STERILE (TOWEL DISPOSABLE) ×1 IMPLANT
WATER STERILE IRR 1000ML POUR (IV SOLUTION) ×1 IMPLANT

## 2022-05-26 NOTE — ED Provider Notes (Signed)
[facility] Provider Note   CSN: [id number] Arrival date & time: 05/26/22  0845     History  Chief Complaint  Patient presents with   Lt Neck Swelling   Post Vascular Surgery    William Hatfield is a 77 y.o. male.  HPI 77 year old male status post left carotid surgery on Monday, discharge Tuesday, restart Eliquis Wednesday can complains of increased swelling.  Patient states the swelling began on Wednesday he thought it improved somewhat yesterday.  However today it was worse.  They called Dr. Unk Lightning, who had performed the surgery and were instructed to come to ED.  Patient denies any lightheadedness, lateralized weakness, difficulty breathing or speaking.     Home Medications Prior to Admission medications   Medication Sig Start Date End Date Taking? Authorizing Provider  acetaminophen (TYLENOL) 500 MG tablet Take 1,000 mg by mouth every 6 (six) hours as needed for moderate pain or headache.   Yes [provider]  allopurinol (ZYLOPRIM) 100 MG tablet Take 100 mg by mouth daily. 01/14/20  Yes [provider]  apixaban (ELIQUIS) 5 MG TABS tablet Take 1 tablet (5 mg total) by mouth 2 (two) times daily. 09/15/19  Yes Verta Ellen., NP  aspirin EC 81 MG tablet Take 1 tablet (81 mg total) by mouth daily at 6 (six) AM. Swallow whole. 05/24/22  Yes Dagoberto Ligas, PA-C  atorvastatin (LIPITOR) 40 MG tablet TAKE 1 TABLET EVERY EVENING Patient taking differently: Take 40 mg by mouth every evening. 01/06/22  Yes Satira Sark, MD  Coenzyme Q10 (COQ-10) 100 MG CAPS Take 100 mg by mouth daily.   Yes [provider]  cyanocobalamin 500 MCG tablet Take 500 mcg by mouth daily.   Yes [provider]  fluticasone (FLONASE) 50 MCG/ACT nasal spray Place 1 spray into both nostrils daily as needed for allergies or rhinitis.   Yes [provider]  furosemide (LASIX) 40 MG tablet Take 1 tablet (40 mg total) by  mouth daily. 08/24/21  Yes Evans Lance, MD  losartan (COZAAR) 50 MG tablet Take 1 tablet (50 mg total) by mouth daily. 09/21/21 09/16/22 Yes Satira Sark, MD  metoprolol tartrate (LOPRESSOR) 25 MG tablet TAKE 1 TABLET TWICE DAILY Patient taking differently: Take 25 mg by mouth 2 (two) times daily. 02/25/21  Yes Shirley Friar, PA-C  Multiple Vitamin (MULTIVITAMIN) tablet Take 1 tablet by mouth daily.   Yes [provider]  nitroGLYCERIN (NITROSTAT) 0.4 MG SL tablet Place 1 tablet (0.4 mg total) under the tongue every 5 (five) minutes x 3 doses as needed for chest pain. 03/27/22  Yes Satira Sark, MD  oxyCODONE-acetaminophen (PERCOCET/ROXICET) 5-325 MG tablet Take 1 tablet by mouth every 6 (six) hours as needed for moderate pain. 05/23/22  Yes Dagoberto Ligas, PA-C  empagliflozin (JARDIANCE) 10 MG TABS tablet Take 1 tablet (10 mg total) by mouth daily before breakfast. Patient not taking: Reported on 05/17/2022 09/27/20   Satira Sark, MD      Allergies    Niaspan [niacin]    Review of Systems   Review of Systems  Physical Exam Updated Vital Signs BP (!) 158/70   Pulse 96   Temp 98.3 F (36.8 C) (Oral)   Resp (!) 25   Ht 1.803 m (5\' 11" )   Wt 96 kg   SpO2 99%   BMI 29.52 kg/m  Physical Exam Vitals and nursing note reviewed.  Constitutional:  Appearance: Normal appearance.  HENT:     Head: Normocephalic.     Right Ear: External ear normal.     Left Ear: External ear normal.     Nose: Nose normal.     Mouth/Throat:     Pharynx: Oropharynx is clear.  Eyes:     Extraocular Movements: Extraocular movements intact.  Neck:     Comments: Incision with sutures intact Swelling noted from angle of mandible to midline No pulsatile mass noted  Musculoskeletal:     Cervical back: Normal range of motion.  Neurological:     Mental Status: He is alert.     ED Results / Procedures / Treatments   Labs (all labs ordered are listed, but only  abnormal results are displayed) Labs Reviewed  CBC - Abnormal; Notable for the following components:      Result Value   RBC 4.06 (*)    Hemoglobin 12.1 (*)    HCT 36.1 (*)    All other components within normal limits  BASIC METABOLIC PANEL - Abnormal; Notable for the following components:   Glucose, Bld 158 (*)    BUN 29 (*)    Creatinine, Ser 1.33 (*)    GFR, Estimated 56 (*)    All other components within normal limits  PROTIME-INR - Abnormal; Notable for the following components:   Prothrombin Time 17.2 (*)    INR 1.4 (*)    All other components within normal limits  I-STAT CHEM 8, ED - Abnormal; Notable for the following components:   BUN 28 (*)    Creatinine, Ser 1.30 (*)    Glucose, Bld 158 (*)    Hemoglobin 8.2 (*)    HCT 24.0 (*)    All other components within normal limits    EKG None  Radiology CT Angio Neck W and/or Wo Contrast  Result Date: 05/26/2022 CLINICAL DATA:  Swelling post carotid surgery (05/22/22) EXAM: CT ANGIOGRAPHY NECK TECHNIQUE: Multidetector CT imaging of the neck was performed using the standard protocol during bolus administration of intravenous contrast. Multiplanar CT image reconstructions and MIPs were obtained to evaluate the vascular anatomy. Carotid stenosis measurements (when applicable) are obtained utilizing NASCET criteria, using the distal internal carotid diameter as the denominator. RADIATION DOSE REDUCTION: This exam was performed according to the departmental dose-optimization program which includes automated exposure control, adjustment of the mA and/or kV according to patient size and/or use of iterative reconstruction technique. CONTRAST:  23mL OMNIPAQUE IOHEXOL 350 MG/ML SOLN COMPARISON:  CTA Neck 05/09/22 FINDINGS: Aortic arch: Standard three-vessel aortic arch. No dissection. No aneurysm. 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 Left carotid  system: Status post left carotid endarterectomy. No evidence of dissection. No stenosis. No evidence of contrast extravasation to suggest arterial injury. Vertebral arteries: Left dominant. No evidence of dissection or occlusion. Moderate narrowing of the origin of the right vertebral artery, unchanged from prior exam. Skeleton: None Other neck: There are postsurgical changes from recent left carotid endarterectomy. There is a large air containing fluid collection surrounding the endarterectomy site (series 9, image 64). Given recent surgery, this is favored to represent hematoma. The presumed hematoma is along the lateral margin left common and internal carotid artery and measures approximately 9.1 x 4.3 x 3.8 cm. There may also be an intramuscular hematoma in the left sternocleidomastoid muscle body. The left internal jugular vein is compressed by the hematoma. Upper chest: Negative IMPRESSION: 1.  Postsurgical changes from recent left carotid endarterectomy. Large air containing fluid collection surrounding the endarterectomy site, favored to represent hematoma. The presumed hematoma is along the lateral margin of the left common and internal carotid artery and measures approximately 9.1 x 4.3 x 3.8 cm. There is also be an intramuscular hematoma in the left sternocleidomastoid muscle body. The left internal jugular vein is compressed by the hematoma. Note, the air component of this collection is favored to be secondary to recent surgery. Air can also be seen in the setting of infection and clinical correlation is recommended to exclude this possibility. 2. 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 Electronically Signed   By: Marin Roberts M.D.   On: 05/26/2022 11:14    Procedures .Critical Care  Performed by: Pattricia Boss, MD Authorized by: Pattricia Boss, MD   Critical care provider statement:    Critical care time (minutes):  30   Critical care was time spent  personally by me on the following activities:  Development of treatment plan with patient or surrogate, discussions with consultants, evaluation of patient's response to treatment, examination of patient, ordering and review of laboratory studies, ordering and review of radiographic studies, ordering and performing treatments and interventions, pulse oximetry, re-evaluation of patient's condition and review of old charts     Medications Ordered in ED Medications  metoprolol tartrate (LOPRESSOR) 12.5 mg tablet (  Canceled Entry 05/26/22 1617)  ceFAZolin (ANCEF) 2-4 GM/100ML-% IVPB (  Canceled Entry 05/26/22 1616)  allopurinol (ZYLOPRIM) tablet 100 mg (has no administration in time range)  aspirin EC tablet 81 mg (has no administration in time range)  atorvastatin (LIPITOR) tablet 40 mg (has no administration in time range)  losartan (COZAAR) tablet 50 mg (has no administration in time range)  metoprolol tartrate (LOPRESSOR) tablet 25 mg (has no administration in time range)  furosemide (LASIX) tablet 40 mg (has no administration in time range)  0.9 %  sodium chloride infusion (has no administration in time range)  magnesium sulfate IVPB 2 g 50 mL (has no administration in time range)  potassium chloride SA (KLOR-CON M) CR tablet 20-40 mEq (has no administration in time range)  ceFAZolin (ANCEF) IVPB 2g/100 mL premix (has no administration in time range)  acetaminophen (TYLENOL) tablet 325-650 mg (has no administration in time range)    Or  acetaminophen (TYLENOL) suppository 325-650 mg (has no administration in time range)  docusate sodium (COLACE) capsule 100 mg (has no administration in time range)  ondansetron (ZOFRAN) injection 4 mg (has no administration in time range)  alum & mag hydroxide-simeth (MAALOX/MYLANTA) 200-200-20 MG/5ML suspension 15-30 mL (has no administration in time range)  pantoprazole (PROTONIX) EC tablet 40 mg (has no administration in time range)  labetalol (NORMODYNE)  injection 10 mg (10 mg Intravenous Given 05/26/22 1357)  hydrALAZINE (APRESOLINE) injection 5 mg (5 mg Intravenous Given 05/26/22 1356)  metoprolol tartrate (LOPRESSOR) injection 2-5 mg (has no administration in time range)  guaiFENesin-dextromethorphan (ROBITUSSIN DM) 100-10 MG/5ML syrup 15 mL (has no administration in time range)  phenol (CHLORASEPTIC) mouth spray 1 spray (has no administration in time range)  0.9 %  sodium chloride infusion (has no administration in time range)  oxyCODONE-acetaminophen (PERCOCET/ROXICET) 5-325 MG per tablet 1-2 tablet (has no administration in time range)  HYDROmorphone (DILAUDID) injection 0.5 mg (has no administration in time range)  fentaNYL (SUBLIMAZE) 100 MCG/2ML injection (  Canceled Entry 05/26/22 1616)  hydrALAZINE (APRESOLINE) 20 MG/ML injection (  Canceled Entry 05/26/22 1616)  oxyCODONE (Oxy IR/ROXICODONE) 5 MG immediate release tablet (  Canceled Entry 05/26/22 1617)  labetalol (NORMODYNE) 5 MG/ML injection (  Canceled Entry 05/26/22 1617)  prothrombin complex conc human (KCENTRA) IVPB 2,262 Units (0 Units Intravenous Stopped 05/26/22 1619)  chlorhexidine (PERIDEX) 0.12 % solution 15 mL (15 mLs Mouth/Throat Given 05/26/22 1109)    Or  Oral care mouth rinse ( Mouth Rinse See Alternative 05/26/22 1109)  oxyCODONE (Oxy IR/ROXICODONE) immediate release tablet 5 mg (5 mg Oral Given 05/26/22 1357)    Or  oxyCODONE (ROXICODONE) 5 MG/5ML solution 5 mg ( Oral See Alternative 05/26/22 1357)  iohexol (OMNIPAQUE) 350 MG/ML injection 75 mL (75 mLs Intravenous Contrast Given 05/26/22 1050)  metoprolol tartrate (LOPRESSOR) tablet 25 mg (25 mg Oral Given 05/26/22 1142)    ED Course/ Medical Decision Making/ A&P Clinical Course as of 05/26/22 1637  Fri May 26, 2022  1008 I-STAT reviewed interpreted significant for BUN 28 creatinine 1.3 glucose of 158 and hemoglobin of 8.2 [DR]  1009 This is significant for hemoglobin drop of 2.5 from 2 days ago [DR]    Clinical Course  User Index [DR] Pattricia Boss, MD                             Medical Decision Making Amount and/or Complexity of Data Reviewed Labs: ordered. Radiology: ordered.  Risk Prescription drug management. Decision regarding hospitalization.   77 year old male with recent carotid surgery restarted on his Eliquis presents today with increased left-sided neck swelling.  Airway is intact.  Patient shows no signs of stroke.  He took his last Eliquis last night. Differential diagnosis includes bleeding and complications from his carotid surgery, infection, Patient being evaluated here with labs and CT angio of neck Discussed with Vicente Serene PA on for Dr. Unk Lightning.  They plan to take the patient to the OR later today.  Patient is made n.p.o. Discussed reversal protocol with ED pharmacist and they will dose.  Patient is hypertensive with blood pressure 162/90 otherwise hemodynamically stable.        Final Clinical Impression(s) / ED Diagnoses Final diagnoses:  Postoperative surgical complication involving circulatory system associated with other circulatory procedure, unspecified complication    Rx / DC Orders ED Discharge Orders     None         Pattricia Boss, MD 05/26/22 1637

## 2022-05-26 NOTE — Anesthesia Preprocedure Evaluation (Addendum)
Anesthesia Evaluation  Patient identified by MRN, date of birth, ID band Patient awake    Reviewed: Allergy & Precautions, NPO status , Patient's Chart, lab work & pertinent test results, reviewed documented beta blocker date and time   History of Anesthesia Complications Negative for: history of anesthetic complications  Airway Mallampati: I  TM Distance: >3 FB Neck ROM: Full    Dental  (+) Edentulous Lower   Pulmonary former smoker   breath sounds clear to auscultation       Cardiovascular hypertension, Pt. on medications and Pt. on home beta blockers (-) angina + CAD (Cx graft occluded), + Past MI, + CABG and + Peripheral Vascular Disease (s/p CEA)  + dysrhythmias Atrial Fibrillation + pacemaker  Rhythm:Regular Rate:Normal  '21 ECHO: EF 60 to 65%. The LV has normal function, no regional wall motion abnormalities. There is mild LVH. Grade I diastolic dysfunction (impaired relaxation).  RVF is normal. The right ventricular size is normal. Trivial MR    Neuro/Psych negative neurological ROS     GI/Hepatic negative GI ROS, Neg liver ROS,,,  Endo/Other  negative endocrine ROS    Renal/GU Renal InsufficiencyRenal disease     Musculoskeletal  (+) Arthritis ,    Abdominal   Peds  Hematology  (+) Blood dyscrasia (Hb 8.2), anemia Eliquis: last dose last night 6pm, received K-Centra in ED    Anesthesia Other Findings Difficulty swallowing s/p CEA yesterday  Reproductive/Obstetrics                             Anesthesia Physical Anesthesia Plan  ASA: 3  Anesthesia Plan: General   Post-op Pain Management: Minimal or no pain anticipated   Induction:   PONV Risk Score and Plan: 2 and Ondansetron and Dexamethasone  Airway Management Planned: Oral ETT and Video Laryngoscope Planned  Additional Equipment: None  Intra-op Plan:   Post-operative Plan: Possible Post-op  intubation/ventilation  Informed Consent: I have reviewed the patients History and Physical, chart, labs and discussed the procedure including the risks, benefits and alternatives for the proposed anesthesia with the patient or authorized representative who has indicated his/her understanding and acceptance.       Plan Discussed with: CRNA and Surgeon  Anesthesia Plan Comments:         Anesthesia Quick Evaluation

## 2022-05-26 NOTE — ED Notes (Signed)
Pt is awake, a&ox4, pwd. Pt has an surgical incision to left carotid that was done on Monday. Since Wednesday pt began to notice a clear purulent drainage and some swelling. Pt complains of 4/10 pain. Site does have some drainage and swelling present, but no bleeding noted. Pt denies any cp/shob or any other complaints. Pt is attached to monitor/vitals. Pt is in gown, side rails up and call light with patient.

## 2022-05-26 NOTE — Anesthesia Postprocedure Evaluation (Signed)
Anesthesia Post Note  Patient: William Hatfield  Procedure(s) Performed: HEMATOMA EVACUATION LEFT NECK (Left: Neck)     Patient location during evaluation: PACU Anesthesia Type: General Level of consciousness: sedated, patient cooperative and oriented Pain management: pain level controlled Vital Signs Assessment: post-procedure vital signs reviewed and stable Respiratory status: spontaneous breathing, nonlabored ventilation, respiratory function stable and patient connected to nasal cannula oxygen Cardiovascular status: blood pressure returned to baseline and stable Postop Assessment: no apparent nausea or vomiting Anesthetic complications: no   No notable events documented.  Last Vitals:  Vitals:   05/26/22 1414 05/26/22 1454  BP: (!) 153/59   Pulse:    Resp:    Temp:  36.4 C  SpO2:      Last Pain:  Vitals:   05/26/22 1454  TempSrc:   PainSc: Asleep                 Kaley Jutras,E. Shaquita Fort
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
## 2022-05-26 NOTE — Anesthesia Procedure Notes (Signed)
Procedure Name: Intubation Date/Time: 05/26/2022 12:24 PM  Performed by: Renato Shin, CRNAPre-anesthesia Checklist: Patient identified, Emergency Drugs available, Suction available, Patient being monitored and Timeout performed Patient Re-evaluated:Patient Re-evaluated prior to induction Oxygen Delivery Method: Circle system utilized Preoxygenation: Pre-oxygenation with 100% oxygen Induction Type: IV induction and Rapid sequence Laryngoscope Size: Glidescope and 4 Grade View: Grade I Tube type: Oral Tube size: 7.5 mm Number of attempts: 1 Airway Equipment and Method: Stylet and Video-laryngoscopy Placement Confirmation: ETT inserted through vocal cords under direct vision, positive ETCO2 and breath sounds checked- equal and bilateral Tube secured with: Tape Dental Injury: Teeth and Oropharynx as per pre-operative assessment

## 2022-05-26 NOTE — Op Note (Signed)
    Patient name: Bosie Summit MRN: [id number] DOB: April 27, 1945 Sex: male  05/26/2022 Pre-operative Diagnosis: Left neck hematoma following carotid endarterectomy Post-operative diagnosis:  Same Surgeon:  Annamarie Major Assistants:  [place] Bing, PA Procedure:   #1: Reopening of recent left neck carotid incision   #2: Evacuation of left neck hematoma Anesthesia:  General Blood Loss:  minimal Specimens:  none  Findings: Approximately 200 cc of hematoma was evacuated.  No obvious source of bleeding was identified.  Indications: This is a 77 year old gentleman who recently underwent left carotid endarterectomy for asymptomatic stenosis.  He has been having neck swelling over the past 2 days.  He is on Eliquis for atrial fibrillation.  He is now having issues with swallowing and so surgical exploration and evacuation of the hematoma was recommended.  He had a CT angiogram that did not show any active bleeding.  Procedure:  The patient was identified in the holding area and taken to [facility] [facility]  The patient was then placed supine on the table. general anesthesia was administered.  The patient was prepped and draped in the usual sterile fashion.  A time out was called and antibiotics were administered.  A PA was necessary to expedite the procedure and assist with technical details.  She help with exposure by providing suction and retraction.  The patient's recent left neck carotid incision was opened with a 10 blade.  The sutures were removed and retractors were placed.  There was old hematoma that was evacuated.  The wound was then copiously irrigated and explored.  I inspected the patch on the carotid artery and there was no obvious bleeding.  I inspected the other areas of the incision and there was no active bleeding.  There was just oozing from raw surface areas.  I then placed Surgiflo within the incision.  A 15 Blake drain was placed over top of the carotid artery and sutured into position with a 3-0  nylon.  I then reapproximated the platysma muscle with 3-0 Vicryl and the skin was closed with 4-0 Monocryl followed by Dermabond.  There were no immediate complications.   Disposition: To PACU stable.   Theotis Burrow, M.D., [facility] [place] Office: [phone] Pager:  [phone]

## 2022-05-26 NOTE — Progress Notes (Signed)
PHARMACIST - PHYSICIAN ORDER COMMUNICATION  CONCERNING: P&T Medication Policy on Herbal Medications  DESCRIPTION:  This patient's order for:  Co-enzyme Q10  has been noted.  This product(s) is classified as an "herbal" or natural product. Due to a lack of definitive safety studies or FDA approval, nonstandard manufacturing practices, plus the potential risk of unknown drug-drug interactions while on inpatient medications, the Pharmacy and Therapeutics Committee does not permit the use of "herbal" or natural products of this type within [facility].   ACTION TAKEN: The pharmacy department is unable to verify this order at this time and your patient has been informed of this safety policy. Please reevaluate patient's clinical condition at discharge and address if the herbal or natural product(s) should be resumed at that time.  Lorenso Courier, PharmD Clinical Pharmacist 05/26/2022 3:11 PM

## 2022-05-26 NOTE — Progress Notes (Signed)
Pt arrived from ..pacu..., A/ox .4..pt denies any pain, MD aware,CCMD called. CHG bath given,no further needs at this time   

## 2022-05-26 NOTE — Transfer of Care (Signed)
Immediate Anesthesia Transfer of Care Note  Patient: William Hatfield  Procedure(s) Performed: HEMATOMA EVACUATION LEFT NECK (Left: Neck)  Patient Location: PACU  Anesthesia Type:General  Level of Consciousness: awake and patient cooperative  Airway & Oxygen Therapy: Patient Spontanous Breathing and Patient connected to face mask oxygen  Post-op Assessment: Report given to RN and Post -op Vital signs reviewed and stable  Post vital signs: Reviewed and stable  Last Vitals:  Vitals Value Taken Time  BP 176/74 05/26/22 1337  Temp    Pulse 95 05/26/22 1340  Resp 26 05/26/22 1340  SpO2 96 % 05/26/22 1340  Vitals shown include unvalidated device data.  Last Pain:  Vitals:   05/26/22 1104  TempSrc:   PainSc: 3       Patients Stated Pain Goal: 1 ([phone])  Complications: No notable events documented.

## 2022-05-26 NOTE — ED Triage Notes (Signed)
Pt came in via POV d/t swelling on the Lt side of neck where he had vascular surgery done this past Monday (05/22/22). Pt reports his Lt carotid artery was cleaned out & he was d/c home 3 days ago & the day after being home it began swelling & this morning he began feeling a difference in his swallowing. Denies difficulty breathing, reports pain is 5/10, A/Ox4.

## 2022-05-27 ENCOUNTER — Encounter (HOSPITAL_COMMUNITY): Payer: Self-pay | Admitting: Surgery

## 2022-05-27 LAB — CBC
HCT: 31.5 % — ABNORMAL LOW (ref 39.0–52.0)
Hemoglobin: 10.1 g/dL — ABNORMAL LOW (ref 13.0–17.0)
MCH: 29 pg (ref 26.0–34.0)
MCHC: 32.1 g/dL (ref 30.0–36.0)
MCV: 90.5 fL (ref 80.0–100.0)
Platelets: 245 10*3/uL (ref 150–400)
RBC: 3.48 MIL/uL — ABNORMAL LOW (ref 4.22–5.81)
RDW: 14.8 % (ref 11.5–15.5)
WBC: 9.9 10*3/uL (ref 4.0–10.5)
nRBC: 0 % (ref 0.0–0.2)

## 2022-05-27 LAB — BASIC METABOLIC PANEL
Anion gap: 10 (ref 5–15)
BUN: 29 mg/dL — ABNORMAL HIGH (ref 8–23)
CO2: 26 mmol/L (ref 22–32)
Calcium: 8.8 mg/dL — ABNORMAL LOW (ref 8.9–10.3)
Chloride: 102 mmol/L (ref 98–111)
Creatinine, Ser: 1.33 mg/dL — ABNORMAL HIGH (ref 0.61–1.24)
GFR, Estimated: 56 mL/min — ABNORMAL LOW (ref >60)
Glucose, Bld: 173 mg/dL — ABNORMAL HIGH (ref 70–99)
Potassium: 4.5 mmol/L (ref 3.5–5.1)
Sodium: 138 mmol/L (ref 135–145)

## 2022-05-27 NOTE — Progress Notes (Addendum)
  Progress Note    05/27/2022 9:55 AM 1 Day Post-Op  Subjective: Neck is feeling better after surgery.  He is able to swallow his pills without any trouble.   Vitals:   05/27/22 0800 05/27/22 0813  BP: (!) 155/67 (!) 149/77  Pulse:  80  Resp: 19 19  Temp:  97.7 F (36.5 C)  SpO2:  99%   Physical Exam: Lungs: Nonlabored Incisions: Left neck with some fullness however soft Extremities: Moving all extremities well Neurologic: Cranial nerves grossly intact  CBC    Component Value Date/Time   WBC 9.9 05/27/2022 0107   RBC 3.48 (L) 05/27/2022 0107   HGB 10.1 (L) 05/27/2022 0107   HCT 31.5 (L) 05/27/2022 0107   PLT 245 05/27/2022 0107   MCV 90.5 05/27/2022 0107   MCH 29.0 05/27/2022 0107   MCHC 32.1 05/27/2022 0107   RDW 14.8 05/27/2022 0107   LYMPHSABS 1.6 02/20/2020 1740   MONOABS 0.8 02/20/2020 1740   EOSABS 0.0 02/20/2020 1740   BASOSABS 0.0 02/20/2020 1740    BMET    Component Value Date/Time   NA 138 05/27/2022 0107   K 4.5 05/27/2022 0107   CL 102 05/27/2022 0107   CO2 26 05/27/2022 0107   GLUCOSE 173 (H) 05/27/2022 0107   BUN 29 (H) 05/27/2022 0107   CREATININE 1.33 (H) 05/27/2022 0107   CREATININE 1.38 (H) 10/03/2016 1107   CALCIUM 8.8 (L) 05/27/2022 0107   GFRNONAA 56 (L) 05/27/2022 0107   GFRAA 46 (L) 09/20/2016 0919    INR    Component Value Date/Time   INR 1.4 (H) 05/26/2022 0913     Intake/Output Summary (Last 24 hours) at 05/27/2022 0955 Last data filed at 05/27/2022 0800 Gross per 24 hour  Intake 1100 ml  Output 285 ml  Net 815 ml     Assessment/Plan:  76 y.o. male is s/p left carotid endarterectomy with subsequent hematoma evacuation and drain placement 1 Day Post-Op   Subjectively, left neck feels much better after surgery Continue left neck JP drain; about 30 cc drainage since surgery Continue to hold Eliquis Continue antibiotic regimen Ok to get out of bed    Dagoberto Ligas, PA-C [facility] [phone] 05/27/2022 9:55 AM   I agree with the above.  I have seen and evaluate the patient.  Subjectively he feels much better.  He is eating without difficulty.  We will plan on keeping him at least 1 more day to monitor his JP output.  Will hold his Eliquis until discharge.  Annamarie Major

## 2022-05-28 NOTE — Progress Notes (Addendum)
  Progress Note    05/28/2022 9:03 AM 2 Days Post-Op  Subjective:  tolerating a diet today, no trouble swallowing   Vitals:   05/28/22 0600 05/28/22 0700  BP: 137/69 (!) 162/63  Pulse:    Resp: 15 (!) 21  Temp:  98.2 F (36.8 C)  SpO2:  99%   Physical Exam: Lungs:  non labored Incisions:  L neck fullness but soft Extremities:  moving all ext well Neurologic: CN grossly intact  CBC    Component Value Date/Time   WBC 9.9 05/27/2022 0107   RBC 3.48 (L) 05/27/2022 0107   HGB 10.1 (L) 05/27/2022 0107   HCT 31.5 (L) 05/27/2022 0107   PLT 245 05/27/2022 0107   MCV 90.5 05/27/2022 0107   MCH 29.0 05/27/2022 0107   MCHC 32.1 05/27/2022 0107   RDW 14.8 05/27/2022 0107   LYMPHSABS 1.6 02/20/2020 1740   MONOABS 0.8 02/20/2020 1740   EOSABS 0.0 02/20/2020 1740   BASOSABS 0.0 02/20/2020 1740    BMET    Component Value Date/Time   NA 138 05/27/2022 0107   K 4.5 05/27/2022 0107   CL 102 05/27/2022 0107   CO2 26 05/27/2022 0107   GLUCOSE 173 (H) 05/27/2022 0107   BUN 29 (H) 05/27/2022 0107   CREATININE 1.33 (H) 05/27/2022 0107   CREATININE 1.38 (H) 10/03/2016 1107   CALCIUM 8.8 (L) 05/27/2022 0107   GFRNONAA 56 (L) 05/27/2022 0107   GFRAA 46 (L) 09/20/2016 0919    INR    Component Value Date/Time   INR 1.4 (H) 05/26/2022 0913     Intake/Output Summary (Last 24 hours) at 05/28/2022 [id number] Last data filed at 05/27/2022 1900 Gross per 24 hour  Intake 581.67 ml  Output 220 ml  Net 361.67 ml     Assessment/Plan:  77 y.o. male is s/p L CEA with subsequent hematoma evacuation 2 Days Post-Op   L neck feeling better since return to OR Drain output about 20cc over last 24h; remove this morning Continue abx regimen; will transition to p.o. regimen at d/c Likely restart Eliquis tomorrow and d/c home   Dagoberto Ligas, PA-C [facility] [phone] 05/28/2022 9:03 AM  I agree with the above.  I have seen and evaluated the patient.  He still has  some fullness to his left neck however symptomatically he is much better.  There has been minimal JP output over the past 24 hours so this will be removed.  Will monitor him overnight and consider restarting his Eliquis tomorrow with possible discharge  Wells Shoni Quijas

## 2022-05-29 ENCOUNTER — Other Ambulatory Visit (HOSPITAL_COMMUNITY): Payer: Self-pay

## 2022-05-29 MED ORDER — CEPHALEXIN 500 MG PO CAPS
500.0000 mg | ORAL_CAPSULE | Freq: Two times a day (BID) | ORAL | 0 refills | Status: AC
  Filled 2022-05-29: qty 14, 7d supply, fill #0

## 2022-05-29 MED ORDER — APIXABAN 5 MG PO TABS
5.0000 mg | ORAL_TABLET | Freq: Two times a day (BID) | ORAL | 6 refills | Status: DC
  Filled 2022-05-29: qty 60, 30d supply, fill #0

## 2022-05-29 NOTE — Discharge Summary (Signed)
Discharge Summary  Patient ID: William Hatfield [id number] 77 y.o. 1946/02/13  Admit date: 05/26/2022  Discharge date and time: 05/29/2022 10:50 AM   Admitting Physician: Serafina Mitchell, MD   Discharge Physician: Dr. Virl Cagey  Admission Diagnoses: Postoperative surgical complication involving circulatory system associated with other circulatory procedure, unspecified complication 0000000 Hematoma of neck [S10.93XA]  Discharge Diagnoses: left neck hematoma  Admission Condition: fair  Discharged Condition: fair  Indication for Admission: post op care  Hospital Course: Mr. Konye Hundley is a 77 year old male who underwent left carotid endarterectomy due to asymptomatic high-grade stenosis.  He was discharged on POD #1 and resumed his Eliquis for atrial fibrillation.  Over the next several days he developed a left neck hematoma and presented to the emergency department on 05/26/2022 with trouble swallowing and hoarse voice.  He was brought to the operating room by Dr. Trula Slade on 05/26/2022 and underwent left neck hematoma evacuation with placement of JP drain.  He tolerated the procedure well and was admitted to the hospital postoperatively.  JP drain was removed after 48 hours.  He was continued on IV Ancef for 72 hours.  Postoperative day #3 left neck still had some fullness however was soft.  He had some serosanguineous drainage from his drain exit site however no bleeding to suggest ongoing bleed from the surgical site.  He will follow-up in office on Friday, 06/02/2022.  He will be discharged on 1 week of Keflex.  He will continue to hold his Eliquis until he is seen in the office this Friday.  Discharge instructions were reviewed with the patient and all questions were answered.  He was discharged home in stable condition.  Consults: None  Treatments: surgery: hematoma evacuation left neck  Discharge Exam: See progress note 05/29/22 Vitals:   05/29/22 0821 05/29/22 0900  BP: (!) 142/75    Pulse: 95 86  Resp: (!) 24 (!) 23  Temp: 98.4 F (36.9 C)   SpO2: 100%      Disposition: Discharge disposition: 01-Home or Self Care       Patient Instructions:  Allergies as of 05/29/2022       Reactions   Niaspan [niacin] Hives, Itching   NIASPAN ONLY        Medication List     TAKE these medications    acetaminophen 500 MG tablet Commonly known as: TYLENOL Take 1,000 mg by mouth every 6 (six) hours as needed for moderate pain or headache.   allopurinol 100 MG tablet Commonly known as: ZYLOPRIM Take 100 mg by mouth daily.   apixaban 5 MG Tabs tablet Commonly known as: Eliquis Take 1 tablet (5 mg total) by mouth 2 (two) times daily. Start taking on: June 02, 2022 What changed: These instructions start on June 02, 2022. If you are unsure what to do until then, ask your doctor or other care provider.   aspirin EC 81 MG tablet Take 1 tablet (81 mg total) by mouth daily at 6 (six) AM. Swallow whole.   atorvastatin 40 MG tablet Commonly known as: LIPITOR TAKE 1 TABLET EVERY EVENING   cephALEXin 500 MG capsule Commonly known as: KEFLEX Take 1 capsule (500 mg total) by mouth 2 (two) times daily for 7 days.   CoQ-10 100 MG Caps Take 100 mg by mouth daily.   cyanocobalamin 500 MCG tablet Commonly known as: VITAMIN B12 Take 500 mcg by mouth daily.   empagliflozin 10 MG Tabs tablet Commonly known as: JARDIANCE Take 1 tablet (10 mg total) by  mouth daily before breakfast.   fluticasone 50 MCG/ACT nasal spray Commonly known as: FLONASE Place 1 spray into both nostrils daily as needed for allergies or rhinitis.   furosemide 40 MG tablet Commonly known as: Lasix Take 1 tablet (40 mg total) by mouth daily.   losartan 50 MG tablet Commonly known as: COZAAR Take 1 tablet (50 mg total) by mouth daily.   metoprolol tartrate 25 MG tablet Commonly known as: LOPRESSOR TAKE 1 TABLET TWICE DAILY   multivitamin tablet Take 1 tablet by mouth daily.    nitroGLYCERIN 0.4 MG SL tablet Commonly known as: NITROSTAT Place 1 tablet (0.4 mg total) under the tongue every 5 (five) minutes x 3 doses as needed for chest pain.   oxyCODONE-acetaminophen 5-325 MG tablet Commonly known as: PERCOCET/ROXICET Take 1 tablet by mouth every 6 (six) hours as needed for moderate pain.       Activity: activity as tolerated Diet: regular diet Wound Care: keep wound clean and dry  Follow-up with [facility] on 06/02/22  Signed: Dagoberto Ligas, PA-C 05/29/2022 10:57 AM [facility] Office: [phone]

## 2022-05-29 NOTE — Discharge Instructions (Signed)
   [facility] [place]  Discharge Instructions   Carotid Endarterectomy (CEA)  Please refer to the following instructions for your post-procedure care. Your surgeon or physician assistant will discuss any changes with you.  Activity  You are encouraged to walk as much as you can. You can slowly return to normal activities but must avoid strenuous activity and heavy lifting until your doctor tell you it's OK. Avoid activities such as vacuuming or swinging a golf club. You can drive after one week if you are comfortable and you are no longer taking prescription pain medications. It is normal to feel tired for serval weeks after your surgery. It is also normal to have difficulty with sleep habits, eating, and bowel movements after surgery. These will go away with time.  Bathing/Showering  You may shower after you come home. Do not soak in a bathtub, hot tub, or swim until the incision heals completely.  Incision Care  Shower every day. Clean your incision with mild soap and water. Pat the area dry with a clean towel. You do not need a bandage unless otherwise instructed. Do not apply any ointments or creams to your incision. You may have skin glue on your incision. Do not peel it off. It will come off on its own in about one week. Your incision may feel thickened and raised for several weeks after your surgery. This is normal and the skin will soften over time. For Men Only: It's OK to shave around the incision but do not shave the incision itself for 2 weeks. It is common to have numbness under your chin that could last for several months.  Diet  Resume your normal diet. There are no special food restrictions following this procedure. A low fat/low cholesterol diet is recommended for all patients with vascular disease. In order to heal from your surgery, it is CRITICAL to get adequate nutrition. Your body requires vitamins, minerals, and protein. Vegetables are the best  source of vitamins and minerals. Vegetables also provide the perfect balance of protein. Processed food has little nutritional value, so try to avoid this.        Medications  Resume taking all of your medications unless your doctor or physician assistant tells you not to. If your incision is causing pain, you may take over-the- counter pain relievers such as acetaminophen (Tylenol). If you were prescribed a stronger pain medication, please be aware these medications can cause nausea and constipation. Prevent nausea by taking the medication with a snack or meal. Avoid constipation by drinking plenty of fluids and eating foods with a high amount of fiber, such as fruits, vegetables, and grains. Do not take Tylenol if you are taking prescription pain medications.  Follow Up  Our office will schedule a follow up appointment 2-3 weeks following discharge.  Please call us immediately for any of the following conditions  Increased pain, redness, drainage (pus) from your incision site. Fever of 101 degrees or higher. If you should develop stroke (slurred speech, difficulty swallowing, weakness on one side of your body, loss of vision) you should call 911 and go to the nearest emergency room.  Reduce your risk of vascular disease:  Stop smoking. If you would like help call [facility] at 1-800-QUIT-NOW (1-800-784-8669) or [place] at 336-586-4000. Manage your cholesterol Maintain a desired weight Control your diabetes Keep your blood pressure down  If you have any questions, please call the office at [phone].   

## 2022-05-29 NOTE — Progress Notes (Addendum)
  Progress Note    05/29/2022 7:56 AM 3 Days Post-Op  Subjective:  wants to go home   Vitals:   05/28/22 2321 05/29/22 0431  BP: (!) 158/73 (!) 152/70  Pulse: (!) 44 (!) 42  Resp: 19 (!) 24  Temp: 98 F (36.7 C) 98.1 F (36.7 C)  SpO2: 100% 100%   Physical Exam: Lungs:  non labored Incisions:  L neck without firm hematoma Neurologic: A&O  CBC    Component Value Date/Time   WBC 9.9 05/27/2022 0107   RBC 3.48 (L) 05/27/2022 0107   HGB 10.1 (L) 05/27/2022 0107   HCT 31.5 (L) 05/27/2022 0107   PLT 245 05/27/2022 0107   MCV 90.5 05/27/2022 0107   MCH 29.0 05/27/2022 0107   MCHC 32.1 05/27/2022 0107   RDW 14.8 05/27/2022 0107   LYMPHSABS 1.6 02/20/2020 1740   MONOABS 0.8 02/20/2020 1740   EOSABS 0.0 02/20/2020 1740   BASOSABS 0.0 02/20/2020 1740    BMET    Component Value Date/Time   NA 138 05/27/2022 0107   K 4.5 05/27/2022 0107   CL 102 05/27/2022 0107   CO2 26 05/27/2022 0107   GLUCOSE 173 (H) 05/27/2022 0107   BUN 29 (H) 05/27/2022 0107   CREATININE 1.33 (H) 05/27/2022 0107   CREATININE 1.38 (H) 10/03/2016 1107   CALCIUM 8.8 (L) 05/27/2022 0107   GFRNONAA 56 (L) 05/27/2022 0107   GFRAA 46 (L) 09/20/2016 0919    INR    Component Value Date/Time   INR 1.4 (H) 05/26/2022 0913     Intake/Output Summary (Last 24 hours) at 05/29/2022 0756 Last data filed at 05/28/2022 1230 Gross per 24 hour  Intake 600 ml  Output --  Net 600 ml     Assessment/Plan:  77 y.o. male is s/p L CEA with subsequent hematoma evacuation 3 Days Post-Op   Hoarse voice and swallowing improved since hematoma evacuation Will transition from IV antibiotics to keflex Ok for discharge home; follow up in office on Friday Continue to hold Eliquis until he is seen in office on Friday   Dagoberto Ligas, PA-C [facility] [phone] 05/29/2022 7:56 AM  VASCULAR STAFF ADDENDUM: I have independently interviewed and examined the patient. I agree with the above.   Will hold eliquis until seen in postop check in the office later this week.   Cassandria Santee, MD [facility] Phone Number: [phone] 05/29/2022 11:37 AM

## 2022-05-29 NOTE — Care Management Important Message (Signed)
Important Message  Patient Details  Name: William Hatfield MRN: [id number] Date of Birth: Feb 23, 1946   Medicare Important Message Given:  Yes     [name] 05/29/2022, 8:54 AM

## 2022-06-02 ENCOUNTER — Ambulatory Visit (INDEPENDENT_AMBULATORY_CARE_PROVIDER_SITE_OTHER): Payer: Medicare Other | Admitting: Physician Assistant

## 2022-06-02 ENCOUNTER — Telehealth: Payer: Self-pay | Admitting: Cardiology

## 2022-06-02 VITALS — BP 136/79 | HR 70 | Temp 98.4°F | Resp 16 | Ht 71.0 in | Wt 203.0 lb

## 2022-06-02 DIAGNOSIS — I480 Paroxysmal atrial fibrillation: Secondary | ICD-10-CM

## 2022-06-02 DIAGNOSIS — I6523 Occlusion and stenosis of bilateral carotid arteries: Secondary | ICD-10-CM

## 2022-06-02 MED ORDER — METOPROLOL TARTRATE 25 MG PO TABS: 25.0000 mg | ORAL_TABLET | Freq: Two times a day (BID) | ORAL | 1 refills | Status: DC

## 2022-06-02 NOTE — Telephone Encounter (Signed)
Refilled as requested,pt just seen 02/2022

## 2022-06-02 NOTE — Telephone Encounter (Signed)
*  STAT* If patient is at the pharmacy, call can be transferred to refill team.   1. Which medications need to be refilled? (please list name of each medication and dose if known) metoprolol tartrate (LOPRESSOR) 25 MG tablet   2. Which pharmacy/location (including street and city if local pharmacy) is medication to be sent to?   [facility] - [place], [place] - [address]    3. Do they need a 30 day or 90 day supply? 90

## 2022-06-02 NOTE — Progress Notes (Signed)
POST OPERATIVE OFFICE NOTE    CC:  F/u for surgery  HPI:  This is a 77 y.o. male who is s/p Left CEA by Dr. Roxan Hockey  on 05/22/22.   He was discharged home post op day 1.  He returned to the ED with left neck hematoma.   He returned to the ED with DR. Brabham for evacuation orf the hematoma.  He has not started his Eliquis for Afib yet.    Pt returns today for follow up.  Pt states he has no swallowing difficulties and no hoarseness.   Allergies  Allergen Reactions   Niaspan [Niacin] Hives and Itching    NIASPAN ONLY    Current Outpatient Medications  Medication Sig Dispense Refill   acetaminophen (TYLENOL) 500 MG tablet Take 1,000 mg by mouth every 6 (six) hours as needed for moderate pain or headache.     allopurinol (ZYLOPRIM) 100 MG tablet Take 100 mg by mouth daily.     aspirin EC 81 MG tablet Take 1 tablet (81 mg total) by mouth daily at 6 (six) AM. Swallow whole. 30 tablet 12   atorvastatin (LIPITOR) 40 MG tablet TAKE 1 TABLET EVERY EVENING (Patient taking differently: Take 40 mg by mouth every evening.) 90 tablet 3   cephALEXin (KEFLEX) 500 MG capsule Take 1 capsule (500 mg total) by mouth 2 (two) times daily for 7 days. 14 capsule 0   Coenzyme Q10 (COQ-10) 100 MG CAPS Take 100 mg by mouth daily.     cyanocobalamin 500 MCG tablet Take 500 mcg by mouth daily.     fluticasone (FLONASE) 50 MCG/ACT nasal spray Place 1 spray into both nostrils daily as needed for allergies or rhinitis.     furosemide (LASIX) 40 MG tablet Take 1 tablet (40 mg total) by mouth daily. 90 tablet 3   losartan (COZAAR) 50 MG tablet Take 1 tablet (50 mg total) by mouth daily. 90 tablet 3   metoprolol tartrate (LOPRESSOR) 25 MG tablet TAKE 1 TABLET TWICE DAILY (Patient taking differently: Take 25 mg by mouth 2 (two) times daily.) 180 tablet 3   Multiple Vitamin (MULTIVITAMIN) tablet Take 1 tablet by mouth daily.     nitroGLYCERIN (NITROSTAT) 0.4 MG SL tablet Place 1 tablet (0.4 mg total) under the tongue  every 5 (five) minutes x 3 doses as needed for chest pain. 25 tablet 3   apixaban (ELIQUIS) 5 MG TABS tablet Take 1 tablet (5 mg total) by mouth 2 (two) times daily. (Patient not taking: Reported on 06/02/2022) 60 tablet 6   empagliflozin (JARDIANCE) 10 MG TABS tablet Take 1 tablet (10 mg total) by mouth daily before breakfast. (Patient not taking: Reported on 05/17/2022) 30 tablet 6   oxyCODONE-acetaminophen (PERCOCET/ROXICET) 5-325 MG tablet Take 1 tablet by mouth every 6 (six) hours as needed for moderate pain. (Patient not taking: Reported on 06/02/2022) 15 tablet 0   No current facility-administered medications for this visit.     ROS:  See HPI  Physical Exam:       No facial droop, no tongue deviation Speech is clear and he is moving all 4 extremities.    Assessment/Plan:   77 y/o male with history of asymptomatic high grade left ICA stenosis s/p left CEA.  He was discharged post op day one in stable condition.  He returned to the ED with left neck hematoma.  He returned to the OR with DR. Braham for evacuation.  He is here today for follow exam.  The neck hematoma has not re occurred.  He has minimal bloody drainage in the lower incision area.  The neck incision is intact without dehiscence.  Likely lysis of the old residual hematoma blood.  No neurologic deficits.  No tongue deviation or facial droop.    He will follow-up with Dr. Arbie Cookey in office in about 2 weeks for incision check and to discuss revascularization of the asymptomatic high-grade stenosis of the right ICA.  He will require right-sided TCAR.  He will need to be started on Plavix prior to this surgery.   He will re start his anticoagulation Monday 06/05/22.   Mosetta Pigeon  [facility] [phone]   Clinic MD:  Karin Lieu

## 2022-06-06 ENCOUNTER — Other Ambulatory Visit: Payer: Self-pay

## 2022-06-06 DIAGNOSIS — I6523 Occlusion and stenosis of bilateral carotid arteries: Secondary | ICD-10-CM

## 2022-06-07 ENCOUNTER — Encounter: Payer: Self-pay | Admitting: Vascular Surgery

## 2022-06-07 ENCOUNTER — Ambulatory Visit (INDEPENDENT_AMBULATORY_CARE_PROVIDER_SITE_OTHER): Payer: Medicare Other | Admitting: Vascular Surgery

## 2022-06-07 VITALS — BP 145/84 | HR 80 | Temp 99.2°F | Ht 71.0 in | Wt 205.2 lb

## 2022-06-07 DIAGNOSIS — I6523 Occlusion and stenosis of bilateral carotid arteries: Secondary | ICD-10-CM

## 2022-06-07 NOTE — Progress Notes (Signed)
[facility] [place]  Patient name: William Hatfield MRN: [id number] DOB: 18-Mar-1945 Sex: male  REASON FOR VISIT: Follow-up left carotid surgery  HPI: William Hatfield is a 77 y.o. male today for follow-up.  He was found to have severe bilateral carotid stenosis.  He underwent left carotid endarterectomy with Dr.Robins on 05/22/2022.  He had uneventful surgery and was discharged home on postoperative day 1.  He resumed his Eliquis as directed.  He presented with significant neck hematoma and was taken to the operating room on 05/26/2022 for evacuation of hematoma.  He is here today for follow-up.  He denies any neurologic deficits.  He does continue to have some old drainage from his left neck incision.  Current Outpatient Medications  Medication Sig Dispense Refill   acetaminophen (TYLENOL) 500 MG tablet Take 1,000 mg by mouth every 6 (six) hours as needed for moderate pain or headache.     allopurinol (ZYLOPRIM) 100 MG tablet Take 100 mg by mouth daily.     apixaban (ELIQUIS) 5 MG TABS tablet Take 1 tablet (5 mg total) by mouth 2 (two) times daily. 60 tablet 6   aspirin EC 81 MG tablet Take 1 tablet (81 mg total) by mouth daily at 6 (six) AM. Swallow whole. 30 tablet 12   atorvastatin (LIPITOR) 40 MG tablet TAKE 1 TABLET EVERY EVENING (Patient taking differently: Take 40 mg by mouth every evening.) 90 tablet 3   Coenzyme Q10 (COQ-10) 100 MG CAPS Take 100 mg by mouth daily.     cyanocobalamin 500 MCG tablet Take 500 mcg by mouth daily.     empagliflozin (JARDIANCE) 10 MG TABS tablet Take 1 tablet (10 mg total) by mouth daily before breakfast. 30 tablet 6   fluticasone (FLONASE) 50 MCG/ACT nasal spray Place 1 spray into both nostrils daily as needed for allergies or rhinitis.     furosemide (LASIX) 40 MG tablet Take 1 tablet (40 mg total) by mouth daily. 90 tablet 3   losartan (COZAAR) 50 MG tablet Take 1 tablet (50 mg total) by mouth daily. 90  tablet 3   metoprolol tartrate (LOPRESSOR) 25 MG tablet Take 1 tablet (25 mg total) by mouth 2 (two) times daily. 180 tablet 1   Multiple Vitamin (MULTIVITAMIN) tablet Take 1 tablet by mouth daily.     nitroGLYCERIN (NITROSTAT) 0.4 MG SL tablet Place 1 tablet (0.4 mg total) under the tongue every 5 (five) minutes x 3 doses as needed for chest pain. 25 tablet 3   No current facility-administered medications for this visit.     PHYSICAL EXAM: Vitals:   06/07/22 1255  BP: (!) 145/84  Pulse: 80  Temp: 99.2 F (37.3 C)  SpO2: 99%  Weight: 205 lb 3.2 oz (93.1 kg)  Height: 5\' 11"  (1.803 m)    GENERAL: The patient is a well-nourished male, in no acute distress. The vital signs are documented above. Neurologically intact Left neck incision intact.  He does have some slight amount of old hematoma draining from the midportion of the incision.  There is no erythema or fluctuance or tenderness.  MEDICAL ISSUES: Stable overall.  He will notify should he develop any erythema or changes in his neck incision.  He is scheduled to see Dr. Karin Lieu in our office on 09/08/2022 to discuss treatment of his asymptomatic right internal close carotid stenosis   Larina Earthly, MD [facility] [phone]  Note: Portions of this report may  have been transcribed using voice recognition software.  Every effort has been made to ensure accuracy; however, inadvertent computerized transcription errors may still be present.

## 2022-06-28 IMAGING — DX DG CHEST 1V PORT
1 series · 1 of 1 positions shown · non-contrast
Comparison: Chest radiograph dated 01/12/2020.

CLINICAL DATA: 74-year-old male with shortness of breath. Positive
diagnosis of recent 3PADL-NQ.

EXAM:
PORTABLE CHEST 1 VIEW

[chest ap]
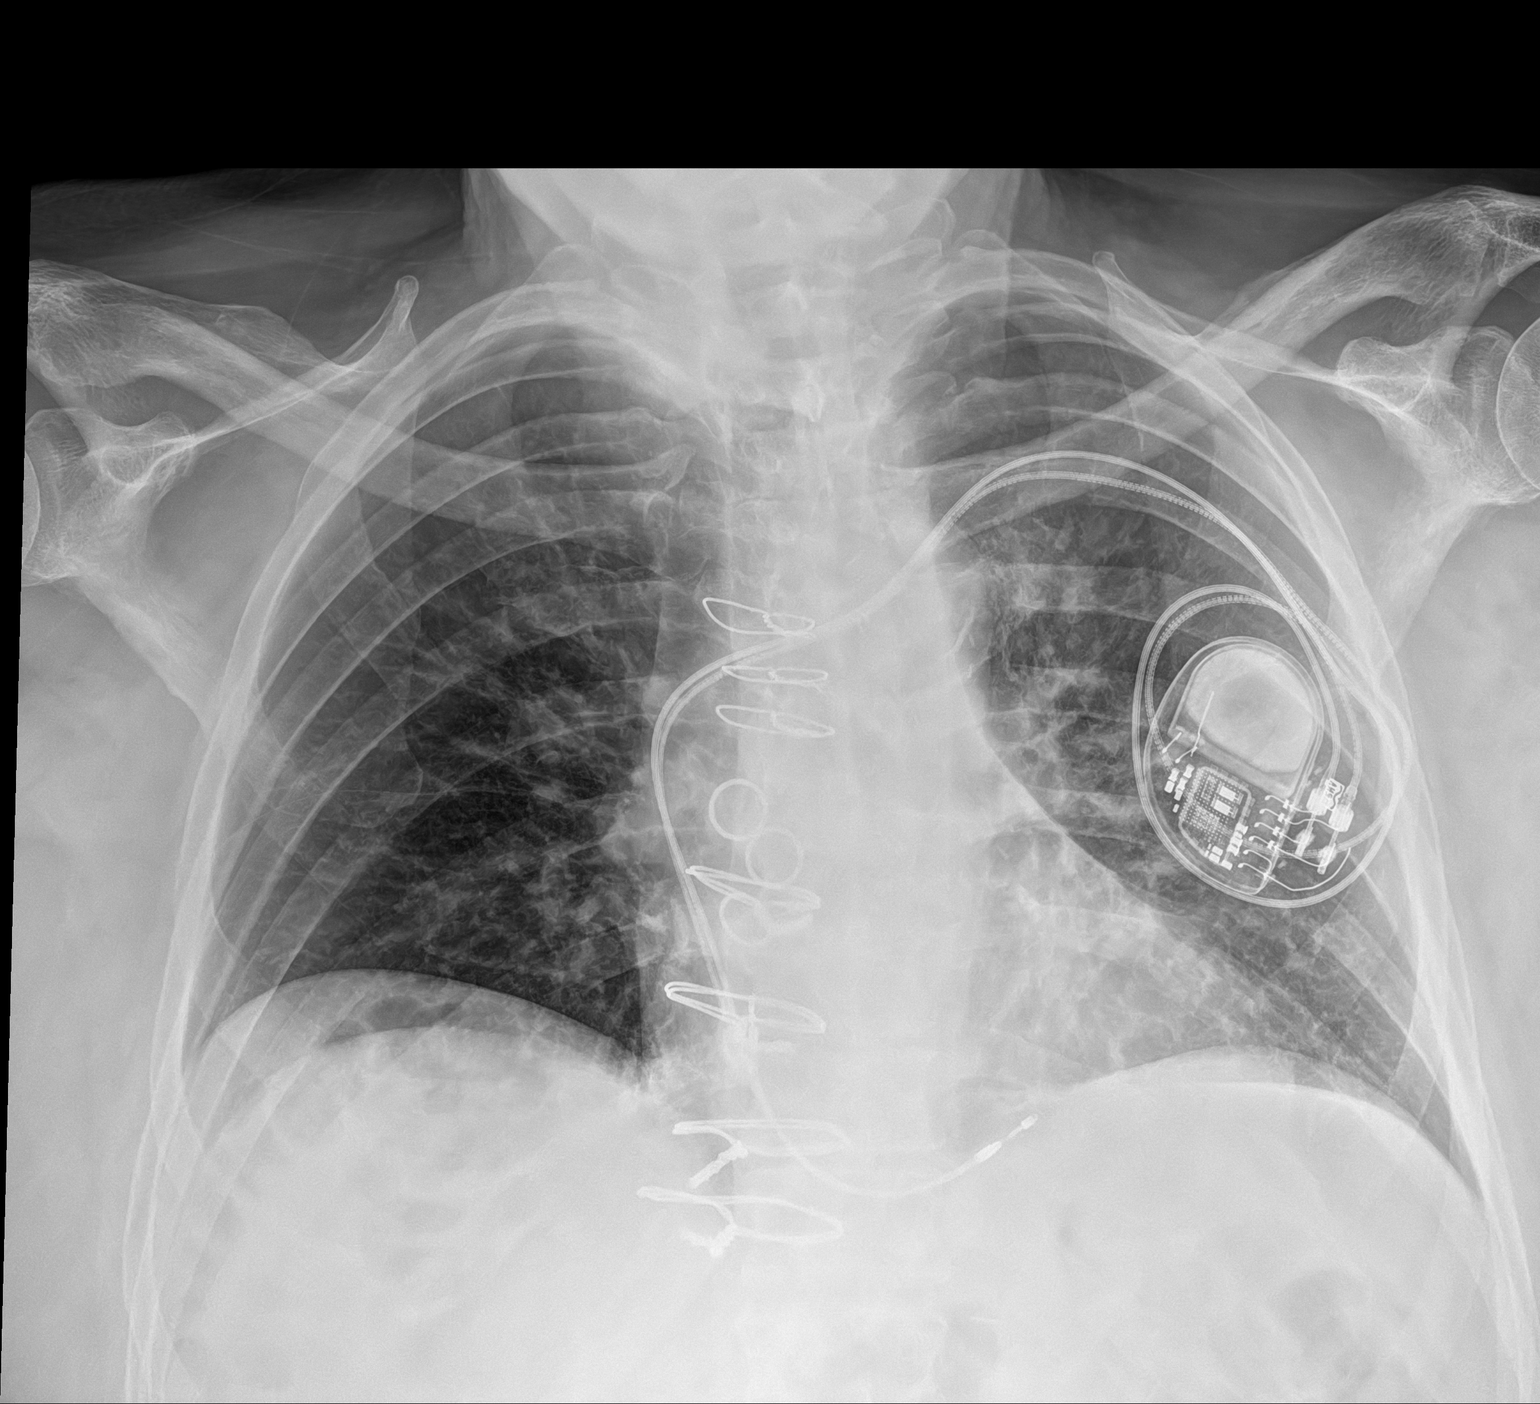

[1 of 1 positions shown; findings below may reference images not displayed]

FINDINGS: Mild diffuse interstitial prominence may represent mild edema. There
is mild haziness of the left lung which may represent atypical
pneumonia. Clinical correlation is recommended. No focal
consolidation, pleural effusion or pneumothorax. The cardiac
silhouette is within limits. Atherosclerotic calcification of the
aorta. Median sternotomy wires and left pectoral pacemaker device.
No acute osseous pathology.
IMPRESSION: 1. Mild interstitial prominence may represent mild edema.
2. Mild haziness of the left lung may represent atypical pneumonia.

## 2022-07-03 ENCOUNTER — Encounter (HOSPITAL_COMMUNITY): Payer: Self-pay | Admitting: Vascular Surgery

## 2022-07-10 ENCOUNTER — Ambulatory Visit (INDEPENDENT_AMBULATORY_CARE_PROVIDER_SITE_OTHER): Payer: Medicare Other

## 2022-07-10 DIAGNOSIS — I442 Atrioventricular block, complete: Secondary | ICD-10-CM | POA: Diagnosis not present

## 2022-07-11 LAB — CUP PACEART REMOTE DEVICE CHECK
Battery Remaining Longevity: 122 mo
Battery Voltage: 3.01 V
Brady Statistic AP VP Percent: 9.91 %
Brady Statistic AP VS Percent: 0 %
Brady Statistic AS VP Percent: 85.35 %
Brady Statistic AS VS Percent: 4.73 %
Brady Statistic RA Percent Paced: 12.87 %
Brady Statistic RV Percent Paced: 95.27 %
Date Time Interrogation Session: 20240512220103
Implantable Lead Connection Status: 753985
Implantable Lead Connection Status: 753985
Implantable Lead Implant Date: 20211115
Implantable Lead Implant Date: 20211115
Implantable Lead Location: 753859
Implantable Lead Location: 753860
Implantable Lead Model: 5076
Implantable Lead Model: 7842
Implantable Lead Serial Number: 1057567
Implantable Pulse Generator Implant Date: 20211115
Lead Channel Impedance Value: 266 Ohm
Lead Channel Impedance Value: 437 Ohm
Lead Channel Impedance Value: 456 Ohm
Lead Channel Impedance Value: 646 Ohm
Lead Channel Pacing Threshold Amplitude: 0.625 V
Lead Channel Pacing Threshold Amplitude: 0.875 V
Lead Channel Pacing Threshold Pulse Width: 0.4 ms
Lead Channel Pacing Threshold Pulse Width: 0.4 ms
Lead Channel Sensing Intrinsic Amplitude: 1 mV
Lead Channel Sensing Intrinsic Amplitude: 1 mV
Lead Channel Sensing Intrinsic Amplitude: 25 mV
Lead Channel Sensing Intrinsic Amplitude: 25 mV
Lead Channel Setting Pacing Amplitude: 1.75 V
Lead Channel Setting Pacing Amplitude: 2.5 V
Lead Channel Setting Pacing Pulse Width: 0.4 ms
Lead Channel Setting Sensing Sensitivity: 1.2 mV
Zone Setting Status: 755011

## 2022-07-21 ENCOUNTER — Other Ambulatory Visit: Payer: Self-pay | Admitting: Internal Medicine

## 2022-07-28 ENCOUNTER — Telehealth: Payer: Self-pay | Admitting: Internal Medicine

## 2022-07-28 MED ORDER — FUROSEMIDE 40 MG PO TABS: 40.0000 mg | ORAL_TABLET | Freq: Every day | ORAL | 3 refills | Status: DC

## 2022-07-28 NOTE — Telephone Encounter (Signed)
*  STAT* If patient is at the pharmacy, call can be transferred to refill team.   1. Which medications need to be refilled? (please list name of each medication and dose if known)   furosemide (LASIX) 40 MG tablet    2. Which pharmacy/location (including street and city if local pharmacy) is medication to be sent to? [facility] - [place], [place] - [address]    3. Do they need a 30 day or 90 day supply? 90 day

## 2022-07-28 NOTE — Telephone Encounter (Signed)
Refilled lasix 40 mg qd #90,RF:3 to [facility]

## 2022-08-03 NOTE — Progress Notes (Signed)
Remote pacemaker transmission.   

## 2022-08-22 ENCOUNTER — Ambulatory Visit: Payer: Medicare Other | Attending: Internal Medicine | Admitting: Internal Medicine

## 2022-08-22 ENCOUNTER — Encounter: Payer: Self-pay | Admitting: Internal Medicine

## 2022-08-22 VITALS — BP 162/88 | HR 70 | Ht 71.0 in | Wt 201.4 lb

## 2022-08-22 DIAGNOSIS — I5032 Chronic diastolic (congestive) heart failure: Secondary | ICD-10-CM | POA: Insufficient documentation

## 2022-08-22 LAB — CUP PACEART INCLINIC DEVICE CHECK
Battery Remaining Longevity: 121 mo
Battery Voltage: 3 V
Brady Statistic AP VP Percent: 11.75 %
Brady Statistic AP VS Percent: 0 %
Brady Statistic AS VP Percent: 83.48 %
Brady Statistic AS VS Percent: 4.76 %
Brady Statistic RA Percent Paced: 15.07 %
Brady Statistic RV Percent Paced: 95.24 %
Date Time Interrogation Session: 20240625104048
Implantable Lead Connection Status: 753985
Implantable Lead Connection Status: 753985
Implantable Lead Implant Date: 20211115
Implantable Lead Implant Date: 20211115
Implantable Lead Location: 753859
Implantable Lead Location: 753860
Implantable Lead Model: 5076
Implantable Lead Model: 7842
Implantable Lead Serial Number: 1057567
Implantable Pulse Generator Implant Date: 20211115
Lead Channel Impedance Value: 266 Ohm
Lead Channel Impedance Value: 456 Ohm
Lead Channel Impedance Value: 494 Ohm
Lead Channel Impedance Value: 665 Ohm
Lead Channel Pacing Threshold Amplitude: 0.5 V
Lead Channel Pacing Threshold Amplitude: 0.625 V
Lead Channel Pacing Threshold Amplitude: 0.75 V
Lead Channel Pacing Threshold Amplitude: 0.75 V
Lead Channel Pacing Threshold Pulse Width: 0.4 ms
Lead Channel Pacing Threshold Pulse Width: 0.4 ms
Lead Channel Pacing Threshold Pulse Width: 0.4 ms
Lead Channel Pacing Threshold Pulse Width: 0.4 ms
Lead Channel Sensing Intrinsic Amplitude: 0.875 mV
Lead Channel Sensing Intrinsic Amplitude: 0.9 mV
Lead Channel Sensing Intrinsic Amplitude: 1 mV
Lead Channel Sensing Intrinsic Amplitude: 25.25 mV
Lead Channel Sensing Intrinsic Amplitude: 25.25 mV
Lead Channel Setting Pacing Amplitude: 1.75 V
Lead Channel Setting Pacing Amplitude: 2.5 V
Lead Channel Setting Pacing Pulse Width: 0.4 ms
Lead Channel Setting Sensing Sensitivity: 1.2 mV
Zone Setting Status: 755011

## 2022-08-22 NOTE — Patient Instructions (Signed)
Medication Instructions:  Your physician recommends that you continue on your current medications as directed. Please refer to the Current Medication list given to you today.   Labwork: None today  Testing/Procedures: None today  Follow-Up: 1 year  Any Other Special Instructions Will Be Listed Below (If Applicable).  If you need a refill on your cardiac medications before your next appointment, please call your pharmacy.  
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
## 2022-09-08 ENCOUNTER — Ambulatory Visit (HOSPITAL_COMMUNITY)
Admission: RE | Admit: 2022-09-08 | Discharge: 2022-09-08 | Disposition: A | Discharge status: Home / Self Care | Payer: Medicare Other | Source: Ambulatory Visit | Attending: Vascular Surgery | Admitting: Vascular Surgery

## 2022-09-08 ENCOUNTER — Ambulatory Visit (INDEPENDENT_AMBULATORY_CARE_PROVIDER_SITE_OTHER): Payer: Medicare Other | Admitting: Vascular Surgery

## 2022-09-08 ENCOUNTER — Encounter: Payer: Self-pay | Admitting: Vascular Surgery

## 2022-09-08 VITALS — BP 174/83 | HR 64 | Temp 98.1°F | Resp 20 | Ht 71.0 in | Wt 200.0 lb

## 2022-09-08 DIAGNOSIS — I6521 Occlusion and stenosis of right carotid artery: Secondary | ICD-10-CM | POA: Diagnosis not present

## 2022-09-08 DIAGNOSIS — Z9889 Other specified postprocedural states: Secondary | ICD-10-CM | POA: Diagnosis not present

## 2022-09-08 DIAGNOSIS — I6523 Occlusion and stenosis of bilateral carotid arteries: Secondary | ICD-10-CM

## 2022-09-08 NOTE — Progress Notes (Signed)
[facility]  Patient name: William Hatfield MRN: [id number] DOB: Jan 20, 1946 Sex: male  REASON FOR VISIT: Follow-up left carotid surgery  HPI: William Hatfield is a 77 y.o. male today for follow-up.  He was found to have severe bilateral carotid stenosis.  He underwent left carotid endarterectomy with Dr.Oryon Gary on 05/22/2022.  He had uneventful surgery and was discharged home on postoperative day 1.  He resumed his Eliquis as directed.  He presented with significant neck hematoma and was taken to the operating room on 05/26/2022 for evacuation of hematoma.  He is here today for follow-up.    On exam today, William Hatfield was doing well.  He has had no TIA, amaurosis, stroke.  States his neck is healed nicely, no drainage.  Current Outpatient Medications  Medication Sig Dispense Refill   acetaminophen (TYLENOL) 500 MG tablet Take 1,000 mg by mouth every 6 (six) hours as needed for moderate pain or headache.     allopurinol (ZYLOPRIM) 100 MG tablet Take 100 mg by mouth daily.     apixaban (ELIQUIS) 5 MG TABS tablet Take 1 tablet (5 mg total) by mouth 2 (two) times daily. 60 tablet 6   aspirin EC 81 MG tablet Take 1 tablet (81 mg total) by mouth daily at 6 (six) AM. Swallow whole. 30 tablet 12   atorvastatin (LIPITOR) 40 MG tablet TAKE 1 TABLET EVERY EVENING (Patient taking differently: Take 40 mg by mouth every evening.) 90 tablet 3   Coenzyme Q10 (COQ-10) 100 MG CAPS Take 100 mg by mouth daily.     cyanocobalamin 500 MCG tablet Take 500 mcg by mouth daily.     empagliflozin (JARDIANCE) 10 MG TABS tablet Take 1 tablet (10 mg total) by mouth daily before breakfast. 30 tablet 6   fluticasone (FLONASE) 50 MCG/ACT nasal spray Place 1 spray into both nostrils daily as needed for allergies or rhinitis.     furosemide (LASIX) 40 MG tablet Take 1 tablet (40 mg total) by mouth daily. 90 tablet 3   losartan (COZAAR) 50 MG tablet Take 1 tablet (50 mg total) by  mouth daily. 90 tablet 3   metoprolol tartrate (LOPRESSOR) 25 MG tablet Take 1 tablet (25 mg total) by mouth 2 (two) times daily. 180 tablet 1   Multiple Vitamin (MULTIVITAMIN) tablet Take 1 tablet by mouth daily.     nitroGLYCERIN (NITROSTAT) 0.4 MG SL tablet Place 1 tablet (0.4 mg total) under the tongue every 5 (five) minutes x 3 doses as needed for chest pain. 25 tablet 3   No current facility-administered medications for this visit.     PHYSICAL EXAM: Vitals:   09/08/22 1121 09/08/22 1123  BP: (!) 165/100 (!) 174/83  Pulse: 64   Resp: 20   Temp: 98.1 F (36.7 C)   SpO2: 97%   Weight: 200 lb (90.7 kg)   Height: 5\' 11"  (1.803 m)     GENERAL: The patient is a well-nourished male, in no acute distress. The vital signs are documented above. Neurologically intact Left neck incision intact.  No hematoma, no swelling, healed nicely.    Assessment and plan:  William Hatfield is a 77 year old male with asymptomatic bilateral carotid stenosis now status post left-sided carotid endarterectomy.    Imaging was reviewed demonstrating widely patent left-sided carotid endarterectomy site.  Will continue to follow the velocities.  We discussed cerebrovascular revascularization of the right internal carotid during his visit today.  There are 2 lesions appreciated, and I have some concern  about his candidacy for TCAR due to lesion morphology.  I have reached out to their representatives.  William Hatfield is comfortable proceeding with either TCAR or carotid endarterectomy, but would like to pursue TCAR if possible due to his previous hematoma complication that occurred postop day 4. My plan is to call him next week after I have discussed his case with [facility]. He was asked to call my office should any questions or concerns arise.    William Sparrow MD Office Tel [phone] Note: Portions of this report may have been transcribed using voice recognition software.  Every effort has been made  to ensure accuracy; however, inadvertent computerized transcription errors may still be present.

## 2022-09-12 ENCOUNTER — Ambulatory Visit: Payer: Medicare Other | Admitting: Vascular Surgery

## 2022-09-19 ENCOUNTER — Other Ambulatory Visit: Payer: Self-pay

## 2022-09-19 DIAGNOSIS — I6521 Occlusion and stenosis of right carotid artery: Secondary | ICD-10-CM

## 2022-09-25 ENCOUNTER — Encounter: Payer: Self-pay | Admitting: Internal Medicine

## 2022-09-25 NOTE — Progress Notes (Signed)
PERIOPERATIVE PRESCRIPTION FOR IMPLANTED CARDIAC DEVICE PROGRAMMING  Patient Information: Name:  William Hatfield  DOB:  08/22/1945  MRN:  [id number]    Planned Procedure:  Right Transcarotid Artery Revascularization  Surgeon:  Dr. Gerarda Fraction  Date of Procedure:  10/02/2022  Cautery will be used.  Position during surgery:  Supine   Please send documentation back to:  [facility] (Fax # [phone])  Device Information:  Clinic EP Physician:  Lewayne Bunting, MD   Device Type:  Pacemaker Manufacturer and Phone #:  Medtronic: [phone] Pacemaker Dependent?:  Yes.   Date of Last Device Check:  09/16/2022 Normal Device Function?:  Yes.    Electrophysiologist's Recommendations:  Have magnet available. Provide continuous ECG monitoring when magnet is used or reprogramming is to be performed.  Procedure will likely interfere with device function.  Device should be programmed:  Asynchronous pacing during procedure and returned to normal programming after procedure  Per Device Clinic Standing Orders, Lenor Coffin, RN  9:33 PM 09/25/2022

## 2022-09-25 NOTE — Pre-Procedure Instructions (Signed)
Surgical Instructions   Your procedure is scheduled on October 02, 2022. Report to [facility] Main Entrance "A" at 8:00 A.M., then check in with the Admitting office. Any questions or running late day of surgery: call [phone]  Questions prior to your surgery date: call [phone], Monday-Friday, 8am-4pm. If you experience any cold or flu symptoms such as cough, fever, chills, shortness of breath, etc. between now and your scheduled surgery, please notify us at the above number.     Remember:  Do not eat or drink after midnight the night before your surgery     Take these medicines the morning of surgery with A SIP OF WATER: allopurinol (ZYLOPRIM)  aspirin EC  metoprolol tartrate (LOPRESSOR)  plavix (CLOPIDOGREL)    May take these medicines IF NEEDED: acetaminophen (TYLENOL)  nitroGLYCERIN (NITROSTAT) - call [phone] if you take a dose prior to surgery   STOP taking your apixaban (ELIQUIS) two days prior to surgery. Your last dose will be August 2nd.  STOP taking your empagliflozin (JARDIANCE) three days prior to surgery. Your last dose will be August 1st.   One week prior to surgery, STOP taking any Aleve, Naproxen, Ibuprofen, Motrin, Advil, Goody's, BC's, all herbal medications, fish oil, and non-prescription vitamins.                     Do NOT Smoke (Tobacco/Vaping) for 24 hours prior to your procedure.  If you use a CPAP at night, you may bring your mask/headgear for your overnight stay.   You will be asked to remove any contacts, glasses, piercing's, hearing aid's, dentures/partials prior to surgery. Please bring cases for these items if needed.    Patients discharged the day of surgery will not be allowed to drive home, and someone needs to stay with them for 24 hours.  SURGICAL WAITING ROOM VISITATION Patients may have no more than 2 support people in the waiting area - these visitors may rotate.   Pre-op nurse will coordinate an appropriate time for 1  ADULT support person, who may not rotate, to accompany patient in pre-op.  Children under the age of 28 must have an adult with them who is not the patient and must remain in the main waiting area with an adult.  If the patient needs to stay at the hospital during part of their recovery, the visitor guidelines for inpatient rooms apply.  Please refer to the [facility] website for the visitor guidelines for any additional information.   If you received a COVID test during your pre-op visit  it is requested that you wear a mask when out in public, stay away from anyone that may not be feeling well and notify your surgeon if you develop symptoms. If you have been in contact with anyone that has tested positive in the last 10 days please notify you surgeon.      Pre-operative CHG Bathing Instructions   You can play a key role in reducing the risk of infection after surgery. Your skin needs to be as free of germs as possible. You can reduce the number of germs on your skin by washing with CHG (chlorhexidine gluconate) soap before surgery. CHG is an antiseptic soap that kills germs and continues to kill germs even after washing.   DO NOT use if you have an allergy to chlorhexidine/CHG or antibacterial soaps. If your skin becomes reddened or irritated, stop using the CHG and notify one of our RNs at [phone].  TAKE A SHOWER THE NIGHT BEFORE SURGERY AND THE DAY OF SURGERY    Please keep in mind the following:  DO NOT shave, including legs and underarms, 48 hours prior to surgery.   You may shave your face before/day of surgery.  Place clean sheets on your bed the night before surgery Use a clean washcloth (not used since being washed) for each shower. DO NOT sleep with pet's night before surgery.  CHG Shower Instructions:  If you choose to wash your hair and private area, wash first with your normal shampoo/soap.  After you use shampoo/soap, rinse your hair and body thoroughly  to remove shampoo/soap residue.  Turn the water OFF and apply half the bottle of CHG soap to a CLEAN washcloth.  Apply CHG soap ONLY FROM YOUR NECK DOWN TO YOUR TOES (washing for 3-5 minutes)  DO NOT use CHG soap on face, private areas, open wounds, or sores.  Pay special attention to the area where your surgery is being performed.  If you are having back surgery, having someone wash your back for you may be helpful. Wait 2 minutes after CHG soap is applied, then you may rinse off the CHG soap.  Pat dry with a clean towel  Put on clean pajamas    Additional instructions for the day of surgery: DO NOT APPLY any lotions, deodorants, cologne, or perfumes.   Do not wear jewelry or makeup Do not wear nail polish, gel polish, artificial nails, or any other type of covering on natural nails (fingers and toes) Do not bring valuables to the hospital. [facility] is not responsible for valuables/personal belongings. Put on clean/comfortable clothes.  Please brush your teeth.  Ask your nurse before applying any prescription medications to the skin.

## 2022-09-26 ENCOUNTER — Other Ambulatory Visit: Payer: Self-pay | Admitting: Vascular Surgery

## 2022-09-26 ENCOUNTER — Encounter (HOSPITAL_COMMUNITY): Payer: Self-pay

## 2022-09-26 ENCOUNTER — Other Ambulatory Visit: Payer: Self-pay

## 2022-09-26 ENCOUNTER — Encounter (HOSPITAL_COMMUNITY)
Admission: RE | Admit: 2022-09-26 | Discharge: 2022-09-26 | Disposition: A | Discharge status: Home / Self Care | Payer: Medicare Other | Source: Ambulatory Visit | Attending: Vascular Surgery | Admitting: Vascular Surgery

## 2022-09-26 VITALS — BP 164/72 | HR 80 | Temp 98.1°F | Resp 18 | Ht 71.0 in | Wt 203.2 lb

## 2022-09-26 DIAGNOSIS — Z01818 Encounter for other preprocedural examination: Secondary | ICD-10-CM

## 2022-09-26 DIAGNOSIS — I129 Hypertensive chronic kidney disease with stage 1 through stage 4 chronic kidney disease, or unspecified chronic kidney disease: Secondary | ICD-10-CM | POA: Diagnosis not present

## 2022-09-26 DIAGNOSIS — I252 Old myocardial infarction: Secondary | ICD-10-CM | POA: Insufficient documentation

## 2022-09-26 DIAGNOSIS — I6521 Occlusion and stenosis of right carotid artery: Secondary | ICD-10-CM | POA: Insufficient documentation

## 2022-09-26 DIAGNOSIS — Z87891 Personal history of nicotine dependence: Secondary | ICD-10-CM | POA: Diagnosis not present

## 2022-09-26 DIAGNOSIS — I25810 Atherosclerosis of coronary artery bypass graft(s) without angina pectoris: Secondary | ICD-10-CM | POA: Insufficient documentation

## 2022-09-26 DIAGNOSIS — I2510 Atherosclerotic heart disease of native coronary artery without angina pectoris: Secondary | ICD-10-CM | POA: Diagnosis not present

## 2022-09-26 DIAGNOSIS — Z01812 Encounter for preprocedural laboratory examination: Secondary | ICD-10-CM | POA: Insufficient documentation

## 2022-09-26 DIAGNOSIS — N189 Chronic kidney disease, unspecified: Secondary | ICD-10-CM | POA: Insufficient documentation

## 2022-09-26 HISTORY — DX: Dyspnea, unspecified: R06.00

## 2022-09-26 HISTORY — DX: Angina pectoris, unspecified: I20.9

## 2022-09-26 HISTORY — DX: Peripheral vascular disease, unspecified: I73.9

## 2022-09-26 HISTORY — DX: Cardiac arrhythmia, unspecified: I49.9

## 2022-09-26 LAB — URINALYSIS, ROUTINE W REFLEX MICROSCOPIC
Bilirubin Urine: NEGATIVE
Glucose, UA: NEGATIVE mg/dL
Hgb urine dipstick: NEGATIVE
Ketones, ur: NEGATIVE mg/dL
Leukocytes,Ua: NEGATIVE
Nitrite: NEGATIVE
Protein, ur: NEGATIVE mg/dL
Specific Gravity, Urine: 1.005 (ref 1.005–1.030)
pH: 6 (ref 5.0–8.0)

## 2022-09-26 LAB — CBC
HCT: 41.8 % (ref 39.0–52.0)
Hemoglobin: 13.2 g/dL (ref 13.0–17.0)
MCH: 28.2 pg (ref 26.0–34.0)
MCHC: 31.6 g/dL (ref 30.0–36.0)
MCV: 89.3 fL (ref 80.0–100.0)
Platelets: 241 10*3/uL (ref 150–400)
RBC: 4.68 MIL/uL (ref 4.22–5.81)
RDW: 14.2 % (ref 11.5–15.5)
WBC: 7.6 10*3/uL (ref 4.0–10.5)
nRBC: 0 % (ref 0.0–0.2)

## 2022-09-26 LAB — COMPREHENSIVE METABOLIC PANEL
ALT: 19 U/L (ref 0–44)
AST: 20 U/L (ref 15–41)
Albumin: 3.8 g/dL (ref 3.5–5.0)
Alkaline Phosphatase: 76 U/L (ref 38–126)
Anion gap: 8 (ref 5–15)
BUN: 17 mg/dL (ref 8–23)
CO2: 30 mmol/L (ref 22–32)
Calcium: 10 mg/dL (ref 8.9–10.3)
Chloride: 100 mmol/L (ref 98–111)
Creatinine, Ser: 1.24 mg/dL (ref 0.61–1.24)
GFR, Estimated: >60 mL/min (ref >60)
Glucose, Bld: 113 mg/dL — ABNORMAL HIGH (ref 70–99)
Potassium: 4.4 mmol/L (ref 3.5–5.1)
Sodium: 138 mmol/L (ref 135–145)
Total Bilirubin: 0.6 mg/dL (ref 0.3–1.2)
Total Protein: 6.8 g/dL (ref 6.5–8.1)

## 2022-09-26 LAB — SURGICAL PCR SCREEN
MRSA, PCR: NEGATIVE
Staphylococcus aureus: NEGATIVE

## 2022-09-26 LAB — TYPE AND SCREEN
ABO/RH(D): A POS
Antibody Screen: NEGATIVE

## 2022-09-26 MED ORDER — CLOPIDOGREL BISULFATE 75 MG PO TABS: 75.0000 mg | ORAL_TABLET | Freq: Every day | ORAL | 6 refills | Status: DC

## 2022-09-26 NOTE — Progress Notes (Signed)
PT/PTT collected at PAT visit hemolyzed. Recollect DOS. Orders placed.

## 2022-09-26 NOTE — Progress Notes (Signed)
PCP - Dr. Serafina Royals - Pt is working on getting established with him Cardiologist - Dr. Nona Dell - Last office visit 08/22/2022 Electrophysiologist - Dr. Lewayne Bunting - Last office visit 03/27/2022  PPM/ICD - Medtronic PPM Device Orders - Device orders requested 7/29 Rep Notified - Medtr[employer] Rep emailed 7/29  Chest x-ray - Denies EKG - 05/26/2022 Stress Test - Per pt, 20+ years ago ECHO - 01/01/2020 Cardiac Cath - 09/18/2016 - angioplasty   Sleep Study - Denies CPAP - n/a  No DM  Last dose of GLP1 agonist- n/a GLP1 instructions: n/a  Blood Thinner Instructions: Pt will stop his Eliquis 2 days prior. Last dose August 2nd. He is starting Plavix today per surgeon and continue through DOS Aspirin Instructions: Pt will continue to take ASA through DOS  NPO after midnight  COVID TEST- n/a   Anesthesia review: Yes. Pacemaker, CABG, MI with angioplasty, on blood thinner.    Patient denies shortness of breath, fever, cough and chest pain at PAT appointment. Pt denies any respiratory illness/infection in the last two months.   All instructions explained to the patient, with a verbal understanding of the material. Patient agrees to go over the instructions while at home for a better understanding. Patient also instructed to self quarantine after being tested for COVID-19. The opportunity to ask questions was provided.
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
## 2022-09-27 NOTE — Anesthesia Preprocedure Evaluation (Addendum)
Anesthesia Evaluation  Patient identified by MRN, date of birth, ID band Patient awake    Reviewed: Allergy & Precautions, NPO status , Patient's Chart, lab work & pertinent test results, reviewed documented beta blocker date and time   History of Anesthesia Complications Negative for: history of anesthetic complications  Airway Mallampati: I  TM Distance: >3 FB     Dental  (+) Dental Advisory Given   Pulmonary former smoker   breath sounds clear to auscultation       Cardiovascular hypertension, Pt. on medications and Pt. 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)  Rhythm:Regular Rate:Normal  '21 ECHO: EF 60-65%, normal LVF, mild LVH, Grade 1 DD, normal RVF, trivial MR   Neuro/Psych negative neurological ROS     GI/Hepatic negative GI ROS, Neg liver ROS,,,  Endo/Other  negative endocrine ROS  BMI 29.6  Renal/GU Renal InsufficiencyRenal disease     Musculoskeletal  (+) Arthritis ,    Abdominal   Peds  Hematology eliquis   Anesthesia Other Findings   Reproductive/Obstetrics                             Anesthesia Physical Anesthesia Plan  ASA: 3  Anesthesia Plan: General   Post-op Pain Management: Tylenol PO (pre-op)*   Induction: Intravenous  PONV Risk Score and Plan: 2 and Ondansetron and Dexamethasone  Airway Management Planned: Oral ETT  Additional Equipment: Arterial line  Intra-op Plan:   Post-operative Plan: Extubation in OR  Informed Consent: I have reviewed the patients History and Physical, chart, labs and discussed the procedure including the risks, benefits and alternatives for the proposed anesthesia with the patient or authorized representative who has indicated his/her understanding and acceptance.     Dental advisory given  Plan Discussed  with: CRNA and Surgeon  Anesthesia Plan Comments: (PAT note written 09/27/2022 by Shonna Chock, PA-C.  )       Anesthesia Quick Evaluation

## 2022-10-02 ENCOUNTER — Inpatient Hospital Stay (HOSPITAL_COMMUNITY)
Admission: RE | Admit: 2022-10-02 | Discharge: 2022-10-03 | DRG: 035 | Disposition: A | Discharge status: Home / Self Care | Payer: Medicare Other | Attending: Vascular Surgery | Admitting: Vascular Surgery

## 2022-10-02 ENCOUNTER — Inpatient Hospital Stay (HOSPITAL_COMMUNITY): Payer: Medicare Other | Admitting: Vascular Surgery

## 2022-10-02 ENCOUNTER — Other Ambulatory Visit: Payer: Self-pay

## 2022-10-02 ENCOUNTER — Inpatient Hospital Stay (HOSPITAL_COMMUNITY): Payer: Medicare Other | Admitting: Anesthesiology

## 2022-10-02 ENCOUNTER — Encounter (HOSPITAL_COMMUNITY): Payer: Self-pay | Admitting: Vascular Surgery

## 2022-10-02 ENCOUNTER — Inpatient Hospital Stay (HOSPITAL_COMMUNITY): Payer: Medicare Other

## 2022-10-02 ENCOUNTER — Encounter (HOSPITAL_COMMUNITY)
Admission: RE | Disposition: A | Discharge status: Home / Self Care | Payer: Self-pay | Source: Home / Self Care | Attending: Vascular Surgery

## 2022-10-02 DIAGNOSIS — I25810 Atherosclerosis of coronary artery bypass graft(s) without angina pectoris: Secondary | ICD-10-CM | POA: Diagnosis present

## 2022-10-02 DIAGNOSIS — Z79899 Other long term (current) drug therapy: Secondary | ICD-10-CM | POA: Diagnosis not present

## 2022-10-02 DIAGNOSIS — H5712 Ocular pain, left eye: Secondary | ICD-10-CM | POA: Diagnosis not present

## 2022-10-02 DIAGNOSIS — Z8616 Personal history of COVID-19: Secondary | ICD-10-CM

## 2022-10-02 DIAGNOSIS — Z9862 Peripheral vascular angioplasty status: Secondary | ICD-10-CM

## 2022-10-02 DIAGNOSIS — Z7902 Long term (current) use of antithrombotics/antiplatelets: Secondary | ICD-10-CM

## 2022-10-02 DIAGNOSIS — I4891 Unspecified atrial fibrillation: Secondary | ICD-10-CM | POA: Diagnosis present

## 2022-10-02 DIAGNOSIS — Z8701 Personal history of pneumonia (recurrent): Secondary | ICD-10-CM

## 2022-10-02 DIAGNOSIS — I129 Hypertensive chronic kidney disease with stage 1 through stage 4 chronic kidney disease, or unspecified chronic kidney disease: Secondary | ICD-10-CM

## 2022-10-02 DIAGNOSIS — I2510 Atherosclerotic heart disease of native coronary artery without angina pectoris: Secondary | ICD-10-CM | POA: Diagnosis present

## 2022-10-02 DIAGNOSIS — Z888 Allergy status to other drugs, medicaments and biological substances status: Secondary | ICD-10-CM

## 2022-10-02 DIAGNOSIS — Z87891 Personal history of nicotine dependence: Secondary | ICD-10-CM

## 2022-10-02 DIAGNOSIS — Z7901 Long term (current) use of anticoagulants: Secondary | ICD-10-CM

## 2022-10-02 DIAGNOSIS — Z7982 Long term (current) use of aspirin: Secondary | ICD-10-CM | POA: Diagnosis not present

## 2022-10-02 DIAGNOSIS — N189 Chronic kidney disease, unspecified: Secondary | ICD-10-CM

## 2022-10-02 DIAGNOSIS — I739 Peripheral vascular disease, unspecified: Secondary | ICD-10-CM | POA: Diagnosis present

## 2022-10-02 DIAGNOSIS — Z950 Presence of cardiac pacemaker: Secondary | ICD-10-CM

## 2022-10-02 DIAGNOSIS — Z01818 Encounter for other preprocedural examination: Secondary | ICD-10-CM

## 2022-10-02 DIAGNOSIS — N1832 Chronic kidney disease, stage 3b: Secondary | ICD-10-CM | POA: Diagnosis present

## 2022-10-02 DIAGNOSIS — I6521 Occlusion and stenosis of right carotid artery: Secondary | ICD-10-CM

## 2022-10-02 DIAGNOSIS — I252 Old myocardial infarction: Secondary | ICD-10-CM | POA: Diagnosis not present

## 2022-10-02 DIAGNOSIS — I6529 Occlusion and stenosis of unspecified carotid artery: Principal | ICD-10-CM

## 2022-10-02 HISTORY — PX: ULTRASOUND GUIDANCE FOR VASCULAR ACCESS: SHX6516

## 2022-10-02 HISTORY — PX: TRANSCAROTID ARTERY REVASCULARIZATIONÂ: SHX6778

## 2022-10-02 LAB — APTT: aPTT: 26 seconds (ref 24–36)

## 2022-10-02 LAB — POCT ACTIVATED CLOTTING TIME
Activated Clotting Time: 195 seconds
Activated Clotting Time: 275 seconds
Activated Clotting Time: 281 seconds

## 2022-10-02 LAB — PROTIME-INR
INR: 1.1 (ref 0.8–1.2)
Prothrombin Time: 14.3 seconds (ref 11.4–15.2)

## 2022-10-02 SURGERY — TRANSCAROTID ARTERY REVASCULARIZATION (TCAR)
Anesthesia: General | Site: Groin | Laterality: Right

## 2022-10-02 MED ORDER — PROMETHAZINE HCL 25 MG/ML IJ SOLN: 6.2500 mg | INTRAMUSCULAR | Status: DC | PRN

## 2022-10-02 MED ORDER — CLOPIDOGREL BISULFATE 75 MG PO TABS
75.0000 mg | ORAL_TABLET | Freq: Every day | ORAL | Status: DC
  Administered 2022-10-02 – 2022-10-03 (×2): 75 mg via ORAL
  Filled 2022-10-02 (×2): qty 1

## 2022-10-02 MED ORDER — IODIXANOL 320 MG/ML IV SOLN
INTRAVENOUS | Status: DC | PRN
  Administered 2022-10-02: 100 mL

## 2022-10-02 MED ORDER — PROPOFOL 10 MG/ML IV BOLUS
INTRAVENOUS | Status: AC
  Filled 2022-10-02: qty 20

## 2022-10-02 MED ORDER — HYDROMORPHONE HCL 1 MG/ML IJ SOLN
0.5000 mg | Freq: Once | INTRAMUSCULAR | Status: AC
  Administered 2022-10-02: 0.5 mg via INTRAVENOUS
  Filled 2022-10-02: qty 0.5

## 2022-10-02 MED ORDER — ONDANSETRON HCL 4 MG/2ML IJ SOLN
INTRAMUSCULAR | Status: AC
  Filled 2022-10-02: qty 2

## 2022-10-02 MED ORDER — CEFAZOLIN SODIUM-DEXTROSE 2-4 GM/100ML-% IV SOLN
INTRAVENOUS | Status: AC
  Filled 2022-10-02: qty 100

## 2022-10-02 MED ORDER — PROTAMINE SULFATE 10 MG/ML IV SOLN
INTRAVENOUS | Status: AC
  Filled 2022-10-02: qty 5

## 2022-10-02 MED ORDER — SODIUM CHLORIDE 0.9 % IV SOLN
500.0000 mL | Freq: Once | INTRAVENOUS | Status: AC | PRN
  Administered 2022-10-02: 500 mL via INTRAVENOUS

## 2022-10-02 MED ORDER — ROCURONIUM BROMIDE 10 MG/ML (PF) SYRINGE
PREFILLED_SYRINGE | INTRAVENOUS | Status: DC | PRN
  Administered 2022-10-02: 70 mg via INTRAVENOUS

## 2022-10-02 MED ORDER — PHENOL 1.4 % MT LIQD: 1.0000 | OROMUCOSAL | Status: DC | PRN

## 2022-10-02 MED ORDER — LIDOCAINE 2% (20 MG/ML) 5 ML SYRINGE
INTRAMUSCULAR | Status: DC | PRN
  Administered 2022-10-02: 40 mg via INTRAVENOUS

## 2022-10-02 MED ORDER — EPHEDRINE 5 MG/ML INJ
INTRAVENOUS | Status: AC
  Filled 2022-10-02: qty 5

## 2022-10-02 MED ORDER — POTASSIUM CHLORIDE CRYS ER 20 MEQ PO TBCR: 20.0000 meq | EXTENDED_RELEASE_TABLET | Freq: Every day | ORAL | Status: DC | PRN

## 2022-10-02 MED ORDER — PANTOPRAZOLE SODIUM 40 MG PO TBEC
40.0000 mg | DELAYED_RELEASE_TABLET | Freq: Every day | ORAL | Status: DC
  Administered 2022-10-03: 40 mg via ORAL
  Filled 2022-10-02: qty 1

## 2022-10-02 MED ORDER — ATORVASTATIN CALCIUM 40 MG PO TABS
40.0000 mg | ORAL_TABLET | Freq: Every evening | ORAL | Status: DC
  Administered 2022-10-02: 40 mg via ORAL
  Filled 2022-10-02: qty 1

## 2022-10-02 MED ORDER — FENTANYL CITRATE (PF) 250 MCG/5ML IJ SOLN
INTRAMUSCULAR | Status: AC
  Filled 2022-10-02: qty 5

## 2022-10-02 MED ORDER — POLYMYXIN B-TRIMETHOPRIM 10000-0.1 UNIT/ML-% OP SOLN
1.0000 [drp] | Freq: Four times a day (QID) | OPHTHALMIC | Status: DC
  Administered 2022-10-02 – 2022-10-03 (×3): 1 [drp] via OPHTHALMIC
  Filled 2022-10-02: qty 10

## 2022-10-02 MED ORDER — LABETALOL HCL 5 MG/ML IV SOLN: 10.0000 mg | INTRAVENOUS | Status: DC | PRN

## 2022-10-02 MED ORDER — METOPROLOL TARTRATE 5 MG/5ML IV SOLN: 2.0000 mg | INTRAVENOUS | Status: DC | PRN

## 2022-10-02 MED ORDER — 0.9 % SODIUM CHLORIDE (POUR BTL) OPTIME
TOPICAL | Status: DC | PRN
  Administered 2022-10-02: 2000 mL

## 2022-10-02 MED ORDER — HEPARIN 6000 UNIT IRRIGATION SOLUTION
Status: DC | PRN
  Administered 2022-10-02: 1

## 2022-10-02 MED ORDER — METOPROLOL TARTRATE 25 MG PO TABS
25.0000 mg | ORAL_TABLET | Freq: Two times a day (BID) | ORAL | Status: DC
  Filled 2022-10-02: qty 1

## 2022-10-02 MED ORDER — ROCURONIUM BROMIDE 10 MG/ML (PF) SYRINGE
PREFILLED_SYRINGE | INTRAVENOUS | Status: AC
  Filled 2022-10-02: qty 10

## 2022-10-02 MED ORDER — ALUM & MAG HYDROXIDE-SIMETH 200-200-20 MG/5ML PO SUSP: 15.0000 mL | ORAL | Status: DC | PRN

## 2022-10-02 MED ORDER — OXYCODONE HCL 5 MG PO TABS: 5.0000 mg | ORAL_TABLET | Freq: Once | ORAL | Status: DC | PRN

## 2022-10-02 MED ORDER — FENTANYL CITRATE (PF) 250 MCG/5ML IJ SOLN
INTRAMUSCULAR | Status: DC | PRN
  Administered 2022-10-02: 50 ug via INTRAVENOUS

## 2022-10-02 MED ORDER — DOCUSATE SODIUM 100 MG PO CAPS
100.0000 mg | ORAL_CAPSULE | Freq: Every day | ORAL | Status: DC
  Administered 2022-10-03: 100 mg via ORAL
  Filled 2022-10-02: qty 1

## 2022-10-02 MED ORDER — SODIUM CHLORIDE 0.9 % IV SOLN
0.1500 ug/kg/min | INTRAVENOUS | Status: AC
  Administered 2022-10-02: .2 ug/kg/min via INTRAVENOUS
  Filled 2022-10-02: qty 2000

## 2022-10-02 MED ORDER — EMPAGLIFLOZIN 10 MG PO TABS: 10.0000 mg | ORAL_TABLET | Freq: Every day | ORAL | Status: DC

## 2022-10-02 MED ORDER — PHENYLEPHRINE 80 MCG/ML (10ML) SYRINGE FOR IV PUSH (FOR BLOOD PRESSURE SUPPORT)
PREFILLED_SYRINGE | INTRAVENOUS | Status: AC
  Filled 2022-10-02: qty 20

## 2022-10-02 MED ORDER — ONDANSETRON HCL 4 MG/2ML IJ SOLN
4.0000 mg | Freq: Four times a day (QID) | INTRAMUSCULAR | Status: DC | PRN
  Administered 2022-10-02: 4 mg via INTRAVENOUS
  Filled 2022-10-02: qty 2

## 2022-10-02 MED ORDER — ACETAMINOPHEN 500 MG PO TABS
1000.0000 mg | ORAL_TABLET | Freq: Once | ORAL | Status: AC
  Administered 2022-10-02: 1000 mg via ORAL
  Filled 2022-10-02: qty 2

## 2022-10-02 MED ORDER — LOSARTAN POTASSIUM 50 MG PO TABS: 50.0000 mg | ORAL_TABLET | Freq: Every day | ORAL | Status: DC

## 2022-10-02 MED ORDER — ASPIRIN 81 MG PO TBEC
81.0000 mg | DELAYED_RELEASE_TABLET | Freq: Every day | ORAL | Status: DC
  Administered 2022-10-03: 81 mg via ORAL
  Filled 2022-10-02: qty 1

## 2022-10-02 MED ORDER — MIDAZOLAM HCL 2 MG/2ML IJ SOLN: 0.5000 mg | Freq: Once | INTRAMUSCULAR | Status: DC | PRN

## 2022-10-02 MED ORDER — MAGNESIUM SULFATE 2 GM/50ML IV SOLN: 2.0000 g | Freq: Every day | INTRAVENOUS | Status: DC | PRN

## 2022-10-02 MED ORDER — CHLORHEXIDINE GLUCONATE 0.12 % MT SOLN: 15.0000 mL | Freq: Once | OROMUCOSAL | Status: AC

## 2022-10-02 MED ORDER — ONDANSETRON HCL 4 MG/2ML IJ SOLN
INTRAMUSCULAR | Status: DC | PRN
  Administered 2022-10-02: 4 mg via INTRAVENOUS

## 2022-10-02 MED ORDER — OXYCODONE HCL 5 MG/5ML PO SOLN: 5.0000 mg | Freq: Once | ORAL | Status: DC | PRN

## 2022-10-02 MED ORDER — PROTAMINE SULFATE 10 MG/ML IV SOLN
INTRAVENOUS | Status: DC | PRN
  Administered 2022-10-02: 10 mg via INTRAVENOUS
  Administered 2022-10-02 (×2): 20 mg via INTRAVENOUS

## 2022-10-02 MED ORDER — LACTATED RINGERS IV SOLN: INTRAVENOUS | Status: DC | PRN

## 2022-10-02 MED ORDER — CHLORHEXIDINE GLUCONATE CLOTH 2 % EX PADS: 6.0000 | MEDICATED_PAD | Freq: Once | CUTANEOUS | Status: DC

## 2022-10-02 MED ORDER — KETOROLAC TROMETHAMINE 0.5 % OP SOLN
OPHTHALMIC | Status: AC
  Filled 2022-10-02: qty 5

## 2022-10-02 MED ORDER — KETOROLAC TROMETHAMINE 0.5 % OP SOLN
1.0000 [drp] | OPHTHALMIC | Status: DC | PRN
  Administered 2022-10-02 – 2022-10-03 (×3): 1 [drp] via OPHTHALMIC

## 2022-10-02 MED ORDER — PHENYLEPHRINE 80 MCG/ML (10ML) SYRINGE FOR IV PUSH (FOR BLOOD PRESSURE SUPPORT)
PREFILLED_SYRINGE | INTRAVENOUS | Status: DC | PRN
  Administered 2022-10-02: 240 ug via INTRAVENOUS
  Administered 2022-10-02: 160 ug via INTRAVENOUS
  Administered 2022-10-02: 240 ug via INTRAVENOUS

## 2022-10-02 MED ORDER — HEPARIN SODIUM (PORCINE) 1000 UNIT/ML IJ SOLN
INTRAMUSCULAR | Status: AC
  Filled 2022-10-02: qty 20

## 2022-10-02 MED ORDER — DEXAMETHASONE SODIUM PHOSPHATE 10 MG/ML IJ SOLN
INTRAMUSCULAR | Status: AC
  Filled 2022-10-02: qty 1

## 2022-10-02 MED ORDER — LIDOCAINE HCL (PF) 1 % IJ SOLN
INTRAMUSCULAR | Status: AC
  Filled 2022-10-02: qty 30

## 2022-10-02 MED ORDER — ALLOPURINOL 100 MG PO TABS: 100.0000 mg | ORAL_TABLET | Freq: Every day | ORAL | Status: DC

## 2022-10-02 MED ORDER — HEPARIN SODIUM (PORCINE) 1000 UNIT/ML IJ SOLN
INTRAMUSCULAR | Status: DC | PRN
Start: 2022-10-02 — End: 2022-10-02
  Administered 2022-10-02: 10000 [IU] via INTRAVENOUS
  Administered 2022-10-02: 5000 [IU] via INTRAVENOUS

## 2022-10-02 MED ORDER — PROPOFOL 10 MG/ML IV BOLUS
INTRAVENOUS | Status: DC | PRN
  Administered 2022-10-02: 100 mg via INTRAVENOUS

## 2022-10-02 MED ORDER — ACETAMINOPHEN 325 MG PO TABS
325.0000 mg | ORAL_TABLET | ORAL | Status: DC | PRN
  Administered 2022-10-02 – 2022-10-03 (×2): 650 mg via ORAL
  Filled 2022-10-02 (×2): qty 2

## 2022-10-02 MED ORDER — CEFAZOLIN SODIUM-DEXTROSE 2-4 GM/100ML-% IV SOLN
2.0000 g | Freq: Three times a day (TID) | INTRAVENOUS | Status: AC
  Administered 2022-10-02 (×2): 2 g via INTRAVENOUS
  Filled 2022-10-02 (×2): qty 100

## 2022-10-02 MED ORDER — FENTANYL CITRATE (PF) 100 MCG/2ML IJ SOLN: 25.0000 ug | INTRAMUSCULAR | Status: DC | PRN

## 2022-10-02 MED ORDER — SODIUM CHLORIDE 0.9 % IV SOLN: INTRAVENOUS | Status: DC

## 2022-10-02 MED ORDER — CEFAZOLIN SODIUM-DEXTROSE 2-4 GM/100ML-% IV SOLN
2.0000 g | INTRAVENOUS | Status: AC
  Administered 2022-10-02: 2 g via INTRAVENOUS

## 2022-10-02 MED ORDER — GUAIFENESIN-DM 100-10 MG/5ML PO SYRP: 15.0000 mL | ORAL_SOLUTION | ORAL | Status: DC | PRN

## 2022-10-02 MED ORDER — CHLORHEXIDINE GLUCONATE 0.12 % MT SOLN
OROMUCOSAL | Status: AC
  Administered 2022-10-02: 15 mL via OROMUCOSAL
  Filled 2022-10-02: qty 15

## 2022-10-02 MED ORDER — LACTATED RINGERS IV SOLN: INTRAVENOUS | Status: DC

## 2022-10-02 MED ORDER — DEXAMETHASONE SODIUM PHOSPHATE 10 MG/ML IJ SOLN
INTRAMUSCULAR | Status: DC | PRN
  Administered 2022-10-02: 5 mg via INTRAVENOUS

## 2022-10-02 MED ORDER — HYDRALAZINE HCL 20 MG/ML IJ SOLN: 5.0000 mg | INTRAMUSCULAR | Status: DC | PRN

## 2022-10-02 MED ORDER — STERILE WATER FOR IRRIGATION IR SOLN
Status: DC | PRN
  Administered 2022-10-02: 1000 mL

## 2022-10-02 MED ORDER — HEPARIN 6000 UNIT IRRIGATION SOLUTION
Status: AC
  Filled 2022-10-02: qty 500

## 2022-10-02 MED ORDER — SUGAMMADEX SODIUM 200 MG/2ML IV SOLN
INTRAVENOUS | Status: DC | PRN
  Administered 2022-10-02: 200 mg via INTRAVENOUS

## 2022-10-02 MED ORDER — ACETAMINOPHEN 650 MG RE SUPP: 325.0000 mg | RECTAL | Status: DC | PRN

## 2022-10-02 MED ORDER — PHENYLEPHRINE HCL-NACL 20-0.9 MG/250ML-% IV SOLN
INTRAVENOUS | Status: DC | PRN
  Administered 2022-10-02: 80 ug/min via INTRAVENOUS

## 2022-10-02 MED ORDER — ORAL CARE MOUTH RINSE: 15.0000 mL | Freq: Once | OROMUCOSAL | Status: AC

## 2022-10-02 MED ORDER — HEMOSTATIC AGENTS (NO CHARGE) OPTIME
TOPICAL | Status: DC | PRN
  Administered 2022-10-02 (×3): 1 via TOPICAL

## 2022-10-02 MED ORDER — MORPHINE SULFATE (PF) 2 MG/ML IV SOLN: 2.0000 mg | INTRAVENOUS | Status: DC | PRN

## 2022-10-02 MED ORDER — MEPERIDINE HCL 25 MG/ML IJ SOLN: 6.2500 mg | INTRAMUSCULAR | Status: DC | PRN

## 2022-10-02 MED ORDER — OXYCODONE-ACETAMINOPHEN 5-325 MG PO TABS: 1.0000 | ORAL_TABLET | ORAL | Status: DC | PRN

## 2022-10-02 SURGICAL SUPPLY — 52 items
ADH SKN CLS APL DERMABOND .7 (GAUZE/BANDAGES/DRESSINGS) ×4
BAG BANDED W/RUBBER/TAPE 36X54 (MISCELLANEOUS) ×2 IMPLANT
BAG COUNTER SPONGE SURGICOUNT (BAG) ×2 IMPLANT
BAG EQP BAND 135X91 W/RBR TAPE (MISCELLANEOUS) ×2
BAG SPNG CNTER NS LX DISP (BAG) ×2
CANISTER SUCT 3000ML PPV (MISCELLANEOUS) ×2 IMPLANT
CATH BALLN ENROUTE 5X35 (CATHETERS) IMPLANT
CATH ROBINSON RED A/P 18FR (CATHETERS) IMPLANT
CLIP TI MEDIUM 6 (CLIP) ×2 IMPLANT
CLIP TI WIDE RED SMALL 6 (CLIP) ×2 IMPLANT
COVER DOME SNAP 22 D (MISCELLANEOUS) ×2 IMPLANT
COVER PROBE W GEL 5X96 (DRAPES) ×2 IMPLANT
DERMABOND ADVANCED .7 DNX12 (GAUZE/BANDAGES/DRESSINGS) ×2 IMPLANT
DEVICE TORQUE KENDALL .025-038 (MISCELLANEOUS) IMPLANT
DRAPE FEMORAL ANGIO 80X135IN (DRAPES) ×2 IMPLANT
DRAPE INCISE IOBAN 66X45 STRL (DRAPES) IMPLANT
ELECT REM PT RETURN 9FT ADLT (ELECTROSURGICAL) ×2
ELECTRODE REM PT RTRN 9FT ADLT (ELECTROSURGICAL) ×2 IMPLANT
GOWN STRL REUS W/ TWL LRG LVL3 (GOWN DISPOSABLE) ×4 IMPLANT
GOWN STRL REUS W/TWL 2XL LVL3 (GOWN DISPOSABLE) ×4 IMPLANT
GOWN STRL REUS W/TWL LRG LVL3 (GOWN DISPOSABLE) ×4
GUIDEWIRE ENROUTE 0.014 (WIRE) ×2 IMPLANT
HEMOSTAT SNOW SURGICEL 2X4 (HEMOSTASIS) IMPLANT
INTRODUCER KIT GALT 7CM (INTRODUCER) ×2
KIT BASIN OR (CUSTOM PROCEDURE TRAY) ×2 IMPLANT
KIT ENCORE 26 ADVANTAGE (KITS) ×2 IMPLANT
KIT INTRODUCER GALT 7 (INTRODUCER) ×2 IMPLANT
KIT TURNOVER KIT B (KITS) ×2 IMPLANT
NDL HYPO 25GX1X1/2 BEV (NEEDLE) IMPLANT
NEEDLE HYPO 25GX1X1/2 BEV (NEEDLE) IMPLANT
PACK CAROTID (CUSTOM PROCEDURE TRAY) ×2 IMPLANT
POSITIONER HEAD DONUT 9IN (MISCELLANEOUS) ×2 IMPLANT
PROTECTION STATION PRESSURIZED (MISCELLANEOUS) ×2
SET MICROPUNCTURE 5F STIFF (MISCELLANEOUS) ×2 IMPLANT
STATION PROTECTION PRESSURIZED (MISCELLANEOUS) ×2 IMPLANT
STENT TRANSCAROTID SYS 10X30 (Permanent Stent) IMPLANT
STENT TRANSCAROTID SYS 7X30 (Permanent Stent) IMPLANT
STENT TRANSCAROTID SYSTEM 9X40 (Permanent Stent) IMPLANT
SUT MNCRL AB 4-0 PS2 18 (SUTURE) ×2 IMPLANT
SUT PROLENE 5 0 C 1 24 (SUTURE) ×2 IMPLANT
SUT PROLENE 6 0 BV (SUTURE) IMPLANT
SUT SILK 2 0 SH (SUTURE) ×2 IMPLANT
SUT VIC AB 3-0 SH 27 (SUTURE) ×2
SUT VIC AB 3-0 SH 27X BRD (SUTURE) ×2 IMPLANT
SYR 10ML LL (SYRINGE) ×6 IMPLANT
SYR 20ML LL LF (SYRINGE) ×2 IMPLANT
SYR CONTROL 10ML LL (SYRINGE) IMPLANT
SYSTEM TRANSCAROTID NEUROPRTCT (MISCELLANEOUS) ×2 IMPLANT
TOWEL GREEN STERILE (TOWEL DISPOSABLE) ×2 IMPLANT
TRANSCAROTID NEUROPROTECT SYS (MISCELLANEOUS) ×2
WATER STERILE IRR 1000ML POUR (IV SOLUTION) ×2 IMPLANT
WIRE BENTSON .035X145CM (WIRE) ×2 IMPLANT

## 2022-10-02 NOTE — Progress Notes (Signed)
Pt increase left eye pain.  Eye flushed with NACL And ice pack applied per MD. No changed or improvement. Anesthesiology notified and will come to bed side to access pt's eye.  Everlean Cherry, RN

## 2022-10-02 NOTE — Anesthesia Procedure Notes (Addendum)
Procedure Name: Intubation Date/Time: 10/02/2022 10:15 AM  Performed by: Nils Pyle, CRNAPre-anesthesia Checklist: Patient identified, Emergency Drugs available, Suction available and Patient being monitored Patient Re-evaluated:Patient Re-evaluated prior to induction Oxygen Delivery Method: Circle System Utilized Preoxygenation: Pre-oxygenation with 100% oxygen Induction Type: IV induction Ventilation: Mask ventilation without difficulty and Oral airway inserted - appropriate to patient size Laryngoscope Size: Mac and 4 Grade View: Grade II Tube type: Oral Number of attempts: 1 Airway Equipment and Method: Stylet and Oral airway Placement Confirmation: ETT inserted through vocal cords under direct vision, positive ETCO2 and breath sounds checked- equal and bilateral Secured at: 22 cm Tube secured with: Tape Dental Injury: Teeth and Oropharynx as per pre-operative assessment

## 2022-10-02 NOTE — H&P (Signed)
[facility]   Patient seen and examined in preop holding.  No complaints. No changes to medication history or physical exam since last seen in clinic. After discussing the risks and benefits of Right TCAR for asymptomatic carotid stenosis, William Hatfield elected to proceed.   Victorino Sparrow MD      Patient name: William Hatfield MRN: [id number] DOB: [birth year] Sex: male  REASON FOR VISIT: Follow-up left carotid surgery  HPI: William Hatfield is a 77 y.o. male today for follow-up.  He was found to have severe bilateral carotid stenosis.  He underwent left carotid endarterectomy with Dr. on 05/22/2022.  He had uneventful surgery and was discharged home on postoperative day 1.  He resumed his Eliquis as directed.  He presented with significant neck hematoma and was taken to the operating room on 05/26/2022 for evacuation of hematoma.  He is here today for follow-up.    On exam today, Trajan was doing well.  He has had no TIA, amaurosis, stroke.  States his neck is healed nicely, no drainage.  Current Facility-Administered Medications  Medication Dose Route Frequency Provider Last Rate Last Admin   0.9 %  sodium chloride infusion   Intravenous Continuous Victorino Sparrow, MD       ceFAZolin (ANCEF) 2-4 GM/100ML-% IVPB            ceFAZolin (ANCEF) IVPB 2g/100 mL premix  2 g Intravenous 30 min Pre-Op Victorino Sparrow, MD       Chlorhexidine Gluconate Cloth 2 % PADS 6 each  6 each Topical Once Victorino Sparrow, MD       And   Chlorhexidine Gluconate Cloth 2 % PADS 6 each  6 each Topical Once Victorino Sparrow, MD       lactated ringers infusion   Intravenous Continuous Jairo Ben, MD 10 mL/hr at 10/02/22 0759 Continued from Pre-op at 10/02/22 0759   remifentanil (ULTIVA) 2 mg in 100 mL normal saline (20 mcg/mL) Optime  0.15 mcg/kg/min Intravenous To OR Nils Pyle, CRNA         PHYSICAL EXAM: Vitals:   10/02/22 0708   BP: (!) 151/71  Pulse: 72  Resp: 18  Temp: 98.1 F (36.7 C)  TempSrc: Oral  SpO2: 100%  Weight: 96.2 kg  Height: 5\' 11"  (1.803 m)    GENERAL: The patient is a well-nourished male, in no acute distress. The vital signs are documented above. Neurologically intact Left neck incision intact.  No hematoma, no swelling, healed nicely.    Assessment and plan:  William Hatfield is a 77 year old male with asymptomatic bilateral carotid stenosis now status post left-sided carotid endarterectomy.    Imaging was reviewed demonstrating widely patent left-sided carotid endarterectomy site.  Will continue to follow the velocities.  We discussed cerebrovascular revascularization of the right internal carotid during his visit today.  There are 2 lesions appreciated, and I have some concern about his candidacy for TCAR due to lesion morphology.  I have reached out to their representatives.  [name] is comfortable proceeding with either TCAR or carotid endarterectomy, but would like to pursue TCAR if possible due to his previous hematoma complication that occurred postop day 4. My plan is to call him next week after I have discussed his case with [facility]. He was asked to call my office should any questions or concerns arise.    Victorino Sparrow MD Office Tel [phone] Note: Portions of this report may have  been transcribed using voice recognition software.  Every effort has been made to ensure accuracy; however, inadvertent computerized transcription errors may still be present.

## 2022-10-02 NOTE — Progress Notes (Deleted)
Called to see patient for Left eye pain.  Patient S/P Right carotid without complications. Complaint of L eye pain with no significant redness on exam but sensitive to lid retraction and photophobia.  Will start antibiotic eye drops and Acular  for pain x 24 hours. Have reassured patient. Have discussed with nurse and attending,  will follow up in am.   Ejoddonojrmd [phone]

## 2022-10-02 NOTE — Anesthesia Post-op Follow-up Note (Signed)
  Called to see patient for Left eye pain.  Patient S/P Right carotid without complications. Complaint of L eye pain with no significant redness on exam but sensitive to lid retraction and photophobia.  Will start antibiotic eye drops and Acular  for pain x 24 hours. Have reassured patient. Have discussed with nurse and attending,  will follow up in am.    Ejoddonojrmd [phone]  ,

## 2022-10-02 NOTE — Anesthesia Postprocedure Evaluation (Signed)
Anesthesia Post Note  Patient: William Hatfield  Procedure(s) Performed: Transcarotid Artery Revascularization (Right: Chest) ULTRASOUND GUIDANCE FOR VASCULAR ACCESS (Left: Groin)     Patient location during evaluation: PACU Anesthesia Type: General Level of consciousness: awake and alert, patient cooperative and oriented Pain management: pain level controlled Vital Signs Assessment: post-procedure vital signs reviewed and stable Respiratory status: spontaneous breathing, nonlabored ventilation, respiratory function stable and patient connected to nasal cannula oxygen Cardiovascular status: blood pressure returned to baseline and stable Postop Assessment: no apparent nausea or vomiting Anesthetic complications: no   No notable events documented.  Last Vitals:  Vitals:   10/02/22 1300 10/02/22 1315  BP: 102/62 113/86  Pulse: 63 60  Resp: 15 15  Temp:  36.4 C  SpO2: 100% 100%    Last Pain:  Vitals:   10/02/22 1315  TempSrc:   PainSc: 0-No pain                 ,E. 

## 2022-10-02 NOTE — Anesthesia Procedure Notes (Signed)
Arterial Line Insertion Start/End8/06/2022 9:20 AM, 10/02/2022 9:31 AM Performed by: Nils Pyle, CRNA  Patient location: Pre-op. Preanesthetic checklist: patient identified, IV checked, site marked, risks and benefits discussed, surgical consent, monitors and equipment checked, pre-op evaluation and anesthesia consent Lidocaine 1% used for infiltration Left, radial was placed Catheter size: 20 G Hand hygiene performed  and maximum sterile barriers used   Attempts: 1 Procedure performed without using ultrasound guided technique. Following insertion, dressing applied and Biopatch. Post procedure assessment: normal and unchanged  Patient tolerated the procedure well with no immediate complications.

## 2022-10-02 NOTE — Progress Notes (Addendum)
Pt arrived to unit from PACU VSS, A/O x 4,  CCMD called ,CHG given, pt oriented to unit,Will continue to monitor. Right neck incision level 0. Left groin level 0. Pt complaint of left eye pain. MD aware    Everlean Cherry, RN    10/02/22 1339  Vitals  Temp 97.6 F (36.4 C)  Temp Source Oral  BP (!) 91/52  MAP (mmHg) (!) 59  BP Location Right Arm  BP Method Automatic  Patient Position (if appropriate) Lying  Pulse Rate 60  Pulse Rate Source Monitor  ECG Heart Rate 67  Resp 15  Level of Consciousness  Level of Consciousness Alert  Oxygen Therapy  SpO2 100 %  O2 Device Room Air  O2 Flow Rate (L/min) 0 L/min  ECG Monitoring  Cardiac Rhythm Ventricular paced  Pain Assessment  Pain Scale 0-10  Pain Score 3  MEWS Score  MEWS Temp 0  MEWS Systolic 1  MEWS Pulse 0  MEWS RR 0  MEWS LOC 0  MEWS Score 1  MEWS Score Color Ch[place] Si

## 2022-10-02 NOTE — Transfer of Care (Signed)
Immediate Anesthesia Transfer of Care Note  Patient: William Hatfield  Procedure(s) Performed: Transcarotid Artery Revascularization (Right: Chest) ULTRASOUND GUIDANCE FOR VASCULAR ACCESS (Left: Groin)  Patient Location: PACU  Anesthesia Type:General  Level of Consciousness: awake and drowsy  Airway & Oxygen Therapy: Patient Spontanous Breathing and Patient connected to face mask oxygen  Post-op Assessment: Report given to RN, Post -op Vital signs reviewed and stable, Patient moving all extremities X 4, and Patient able to stick tongue midline  Post vital signs: Reviewed and stable  Last Vitals:  Vitals Value Taken Time  BP 110/71 10/02/22 1240  Temp    Pulse 66 10/02/22 1243  Resp 20 10/02/22 1243  SpO2 100 % 10/02/22 1243  Vitals shown include unfiled device data.  Last Pain:  Vitals:   10/02/22 0712  TempSrc:   PainSc: 0-No pain         Complications: No notable events documented.

## 2022-10-02 NOTE — Op Note (Signed)
NAME: William Hatfield    MRN: [id number] DOB: 09/04/1945    DATE OF OPERATION: 10/02/2022  PREOP DIAGNOSIS:    Right asymptomatic carotid stenosis  POSTOP DIAGNOSIS:    Same  PROCEDURE:    Right asymptomatic internal carotid artery stenosis  SURGEON: Victorino Sparrow  ASSIST: Cari Caraway MD  ANESTHESIA: General   EBL: 50ml  INDICATIONS:    William Hatfield is a 76 y.o. male with asymptomatic bilateral carotid stenosis now status post left-sided carotid endarterectomy, in need of right-sided cerebrovascular revascularization.   I discussed the risk and benefits of both carotid endarterectomy versus transcarotid artery revascularization, William Hatfield elected to proceed with TCAR.   FINDINGS:   Two tandem lesions extending from the ostia of the internal carotid artery into the internal carotid artery with greater than 80% stenosis each. 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  TECHNIQUE:   The patient was brought to the operating room, where support lines were placed and general anesthesia was secured. The right neck and left groin were prepped and the patient was sterilely draped. A transverse 2-4 cm incision was made between the sternal and clavicular heads of the sternocleidomastoid muscle, below the omohyoid. Following longitudinal division of the carotid sheath the jugular vein was partially dissected and retracted medially. Once 3 cm of common carotid artery (CCA) were isolated, umbilical tape was placed around the proximal 1/3 of the CCA under direct vision. A 5.0 polypropylene suture was pre-placed in the anterior wall of the CCA, in a "U stitch" configuration, close to the clavicle to facilitate hemostasis upon removal of the arterial sheath at completion of the TCAR procedure.  The contralateral (left) common femoral vein (CFV) was  accessed under ultrasound guidance, using standard Seldinger and micropuncture access technique. Permanent recorded image(s) was/were saved in the patient's medical record. The Venous Return Sheath was advanced into the CFV over the 0.035" wire provided. Blood was aspirated from the flow line followed by flushing of the Venous Sheath with heparinized saline. The Venous Sheath was secured to the patient's skin with suture to maintain optimal position in the vessel.  Heparin was given to obtain a therapeutic activated clotting time >250 seconds prior to arterial access. A 4-French non-stiffened ENHANCE Transcarotid / Peripheral Access set was used, puncturing the artery with the 21G needle through the pre-placed "U" stitch while holding gentle traction on the umbilical tape to stabilize and centralize the CCA within the incision.  This was done through a tunnel track that was made caudal to the incision due to the short run away.  Careful attention was paid to the change in CCA shape when using the umbilical tape to control or lift the artery. The micropuncture wire was then advanced and sent 3-4 cm into the CCA and, the 21G needle was removed. The micropuncture sheath was advanced 2-3 cm into the CCA and the wire and dilator were removed. Pulsatile backflow indicated correct positioning. The provided 0.035" J-tipped guidewire was inserted as close as possible to the bifurcation without engaging the lesion. After micropuncture sheath removal, the Transcarotid Arterial Sheath was advanced to the 2.5cm marker and the 0.035" wire and dilator were then removed. Arterial Sheath position was assessed under fluoroscopy in two projections to ensure that the sheath tip was oriented coaxially in the CCA. The Arterial Sheath was sutured to the patient with gentle forward tension. Blood  was slowly aspirated followed by flushing with heparinized saline. No ingress of air bubbles through the passive hemostatic valve was  observed. The stopcocks were closed. Traction applied to the CCA previously to facilitate access was gently released.  The Flow Controller was connected to the Transcarotid Arterial Sheath, prepared by passively allowing a column of arterial blood to fill the line and connected to the Venous Return Sheath. CCA inflow was occluded proximal to the arteriotomy with a vascular clamp to achieve active flow reversal. To confirm flow reversal, a saline bolus was delivered into the venous flow line on both "High" and "Low" flow settings of the Flow Controller. Angiograms were performed with slow injections of a small amount of contrast filling just past the lesion to minimize antegrade transmission of micro-bubbles.  Angiograms demonstrated abnormal filling of the internal carotid artery.  With what appeared to be a dissection.    Prior to lesion manipulation, heart rate (70bpm) and systolic BP (140-134mmHg) were managed upwards to optimize flow reversal and procedural neuroprotection.  Fortunately,, I was able to cross the lesion with an 0.014" ENROUTE guidewire and pre-dilation of the lesion was performed with a 5mm x 30mm rapid exchange 0.014" compatible balloon catheter to 8 atmospheres for 10 seconds. Stenting was performed with an 9mm x 40mm ENROUTE Transcarotid stent, sized appropriately to the right CCA. AP and lateral angiograms (gentle contrast injections) were performed to confirm stent placement and arterial wall stent apposition.  There appeared to be some residual dissection, or flow within the wall.  I elected to extend to the stents both proximally and distally.  Distally, a 7 x 30 mm stent was used, approximately, a 10 x 40 mm stent was used.  Follow-up angiography demonstrated excellent result with resolution of the dissection.  There is High-grade flow in the internal carotid artery.  At [facility] case completion, antegrade flow was restored by releasing the clamp on the CCA then closing the NPS  stopcocks to the flow lines.  The Transcarotid Arterial Sheath was removed and the pre-closure suture was tied. An ultrasound was used to assess the common carotid artery.  There were no dissections appreciated.  There was normal multiphasic signal with insonation.  Heparin reversal was employed.   The Venous Return Sheath was removed and hemostasis was achieved with brief manual compression.  The patient tolerated the procedure well and was extubated on the table. The patient was moving all four extremities to command prior to transfer to the recovery room.   William Snide, MD [facility] DATE OF DICTATION:   10/02/2022

## 2022-10-02 NOTE — Addendum Note (Signed)
Addendum  created 10/02/22 1726 by Bethena Midget, MD   Clinical Note Signed

## 2022-10-03 ENCOUNTER — Ambulatory Visit: Payer: Medicare Other | Admitting: Cardiology

## 2022-10-03 ENCOUNTER — Encounter (HOSPITAL_COMMUNITY): Payer: Self-pay | Admitting: Vascular Surgery

## 2022-10-03 MED ORDER — APIXABAN 5 MG PO TABS: 5.0000 mg | ORAL_TABLET | Freq: Two times a day (BID) | ORAL | 6 refills | Status: DC

## 2022-10-03 MED ORDER — OXYCODONE-ACETAMINOPHEN 5-325 MG PO TABS: 1.0000 | ORAL_TABLET | Freq: Four times a day (QID) | ORAL | 0 refills | Status: DC | PRN

## 2022-10-03 NOTE — Plan of Care (Signed)
  Problem: Education: Goal: Knowledge of General Education information will improve Description: Including pain rating scale, medication(s)/side effects and non-pharmacologic comfort measures 10/03/2022 0500 by Orson Ape, RN Outcome: Progressing 10/03/2022 0452 by Orson Ape, RN Outcome: Progressing   Problem: Health Behavior/Discharge Planning: Goal: Ability to manage health-related needs will improve 10/03/2022 0500 by Orson Ape, RN Outcome: Progressing 10/03/2022 0452 by Orson Ape, RN Outcome: Progressing   Problem: Clinical Measurements: Goal: Ability to maintain clinical measurements within normal limits will improve 10/03/2022 0500 by Orson Ape, RN Outcome: Progressing 10/03/2022 0452 by Orson Ape, RN Outcome: Progressing Goal: Will remain free from infection 10/03/2022 0500 by Orson Ape, RN Outcome: Progressing 10/03/2022 0452 by Orson Ape, RN Outcome: Progressing Goal: Diagnostic test results will improve 10/03/2022 0500 by Orson Ape, RN Outcome: Progressing 10/03/2022 0452 by Orson Ape, RN Outcome: Progressing Goal: Respiratory complications will improve 10/03/2022 0500 by Orson Ape, RN Outcome: Progressing 10/03/2022 0452 by Orson Ape, RN Outcome: Progressing Goal: Cardiovascular complication will be avoided 10/03/2022 0500 by Orson Ape, RN Outcome: Progressing 10/03/2022 0452 by Orson Ape, RN Outcome: Progressing   Problem: Activity: Goal: Risk for activity intolerance will decrease 10/03/2022 0500 by Orson Ape, RN Outcome: Progressing 10/03/2022 0452 by Orson Ape, RN Outcome: Progressing   Problem: Nutrition: Goal: Adequate nutrition will be maintained 10/03/2022 0500 by Orson Ape, RN Outcome: Progressing 10/03/2022 0452 by Orson Ape, RN Outcome: Progressing   Problem: Coping: Goal: Level of anxiety will decrease 10/03/2022 0500 by Orson Ape, RN Outcome: Progressing 10/03/2022 0452 by Orson Ape, RN Outcome: Progressing   Problem: Elimination: Goal: Will not experience complications related to bowel motility 10/03/2022 0500 by Orson Ape, RN Outcome: Progressing 10/03/2022 0452 by Orson Ape, RN Outcome: Progressing Goal: Will not experience complications related to urinary retention 10/03/2022 0500 by Orson Ape, RN Outcome: Progressing 10/03/2022 0452 by Orson Ape, RN Outcome: Progressing   Problem: Pain Managment: Goal: General experience of comfort will improve 10/03/2022 0500 by Orson Ape, RN Outcome: Progressing 10/03/2022 0452 by Orson Ape, RN Outcome: Progressing   Problem: Safety: Goal: Ability to remain free from injury will improve 10/03/2022 0500 by Orson Ape, RN Outcome: Progressing 10/03/2022 0452 by Orson Ape, RN Outcome: Progressing   Problem: Skin Integrity: Goal: Risk for impaired skin integrity will decrease 10/03/2022 0500 by Orson Ape, RN Outcome: Progressing 10/03/2022 0452 by Orson Ape, RN Outcome: Progressing

## 2022-10-03 NOTE — Plan of Care (Signed)

## 2022-10-03 NOTE — Progress Notes (Signed)
Call received to evaluate patient prior to discharge. Patient seen in room and he denied complaints. Recommended continuing eye drops post-discharge if needed. Recommended follow-up with eye specialist if needed. Patient stable for discharge from an anesthesia standpoint.

## 2022-10-03 NOTE — Progress Notes (Signed)
Pt's SBP was in 80's 90's (88/51, 96/45, 91/52), A-line BP was in low 90's as well. 500 cc NS bolus was given as ordered. Bp did not change much after bolus. Dr Karin Lieu notified. Notified MD pt is AO x4, non-symptomatic and NIH scale is 0. MD said continue to monitor and Give another 500 cc NS bolus if SBP drops below 90. After Bolus, NS continued at 45ml/hr. Plan of care continues.

## 2022-10-03 NOTE — TOC Transition Note (Signed)
Transition of Care (TOC) - CM/SW Discharge Note Donn Pierini RN, BSN Transitions of Care Unit 4E- RN Case Manager See Treatment Team for direct phone #   Patient Details  Name: William Hatfield MRN: [id number] Date of Birth: [birth year]  Transition of Care [facility]) CM/SW Contact:  Darrold Span, RN Phone Number: 10/03/2022, 1:13 PM   Clinical Narrative:    Pt stable for transition home today, no TOC needs noted, pt has transportation home.  Pt to follow up as per AVS instructions.    Final next level of care: Home/Self Care Barriers to Discharge: No Barriers Identified   Patient Goals and CMS Choice CMS Medicare.gov Compare Post Acute Care list provided to:: Patient Choice offered to / list presented to : NA  Discharge Placement                 Home        Discharge Plan and Services Additional resources added to the After Visit Summary for       Post Acute Care Choice: NA                               Social Determinants of Health (SDOH) Interventions SDOH Screenings   Tobacco Use: Medium Risk (10/02/2022)     Readmission Risk Interventions    10/03/2022    1:13 PM  Readmission Risk Prevention Plan  Transportation Screening Complete  Home Care Screening Complete  Medication Review (RN CM) Complete

## 2022-10-03 NOTE — Progress Notes (Signed)
Mobility Specialist Progress Note:   10/03/22 1100  Mobility  Activity Ambulated with assistance in hallway  Level of Assistance Contact guard assist, steadying assist  Assistive Device None  Distance Ambulated (ft) 280 ft  Activity Response Tolerated well  Mobility Referral Yes  $Mobility charge 1 Mobility  Mobility Specialist Start Time (ACUTE ONLY) 1130  Mobility Specialist Stop Time (ACUTE ONLY) 1140  Mobility Specialist Time Calculation (min) (ACUTE ONLY) 10 min    Pre Mobility: 93 HR During Mobility: 104 HR Post Mobility:  98 HR  Pt received ambulating in room, agreeable to mobility. C/o of L knee pain from existing issues, otherwise asymptomatic. Pt left ambulating in room with call bell near.  William Hatfield Mobility Specialist Please contact via Special educational needs teacher or Rehab office at [phone]

## 2022-10-03 NOTE — Discharge Instructions (Signed)
   [facility] [place]  Discharge Instructions   Carotid Endarterectomy (CEA)  Please refer to the following instructions for your post-procedure care. Your surgeon or physician assistant will discuss any changes with you.  Activity  You are encouraged to walk as much as you can. You can slowly return to normal activities but must avoid strenuous activity and heavy lifting until your doctor tell you it's OK. Avoid activities such as vacuuming or swinging a golf club. You can drive after one week if you are comfortable and you are no longer taking prescription pain medications. It is normal to feel tired for serval weeks after your surgery. It is also normal to have difficulty with sleep habits, eating, and bowel movements after surgery. These will go away with time.  Bathing/Showering  You may shower after you come home. Do not soak in a bathtub, hot tub, or swim until the incision heals completely.  Incision Care  Shower every day. Clean your incision with mild soap and water. Pat the area dry with a clean towel. You do not need a bandage unless otherwise instructed. Do not apply any ointments or creams to your incision. You may have skin glue on your incision. Do not peel it off. It will come off on its own in about one week. Your incision may feel thickened and raised for several weeks after your surgery. This is normal and the skin will soften over time. For Men Only: It's OK to shave around the incision but do not shave the incision itself for 2 weeks. It is common to have numbness under your chin that could last for several months.  Diet  Resume your normal diet. There are no special food restrictions following this procedure. A low fat/low cholesterol diet is recommended for all patients with vascular disease. In order to heal from your surgery, it is CRITICAL to get adequate nutrition. Your body requires vitamins, minerals, and protein. Vegetables are the best  source of vitamins and minerals. Vegetables also provide the perfect balance of protein. Processed food has little nutritional value, so try to avoid this.        Medications  Resume taking all of your medications unless your doctor or physician assistant tells you not to. If your incision is causing pain, you may take over-the- counter pain relievers such as acetaminophen (Tylenol). If you were prescribed a stronger pain medication, please be aware these medications can cause nausea and constipation. Prevent nausea by taking the medication with a snack or meal. Avoid constipation by drinking plenty of fluids and eating foods with a high amount of fiber, such as fruits, vegetables, and grains. Do not take Tylenol if you are taking prescription pain medications.  Follow Up  Our office will schedule a follow up appointment 2-3 weeks following discharge.  Please call us immediately for any of the following conditions  Increased pain, redness, drainage (pus) from your incision site. Fever of 101 degrees or higher. If you should develop stroke (slurred speech, difficulty swallowing, weakness on one side of your body, loss of vision) you should call 911 and go to the nearest emergency room.  Reduce your risk of vascular disease:  Stop smoking. If you would like help call [facility] at 1-800-QUIT-NOW (1-800-784-8669) or  at 336-586-4000. Manage your cholesterol Maintain a desired weight Control your diabetes Keep your blood pressure down  If you have any questions, please call the office at [phone].  

## 2022-10-03 NOTE — Progress Notes (Signed)
NAEO Right eye less pain  Doing well postop day 1 right TCAR Right eye feels better with drops - anesthesia seeing for corneal abrasion Sensory/motor intact, no deficits   Home today after anesthesia sees Restart Eliquis in one week.  Continue ASA/ Plavix for one month, at which time can stop plavix (pt was given 35 pills preop and asked to complete the bottle)    Victorino Sparrow MD

## 2022-10-03 NOTE — Progress Notes (Addendum)
  Progress Note    10/03/2022 8:06 AM 1 Day Post-Op  Subjective:  no complaints.  No signs or symptoms of TIA or CVA   Vitals:   10/03/22 0617 10/03/22 0751  BP: (!) 102/54 (!) 97/54  Pulse: 78 74  Resp: 20   Temp: 98.2 F (36.8 C) 98 F (36.7 C)  SpO2: 100%    Physical Exam: Lungs:  non labored Incisions:  R neck c/d/I without hematoma Extremities:  moving all ext well Neurologic: CN grossly intact  CBC    Component Value Date/Time   WBC 12.9 (H) 10/03/2022 0522   RBC 3.55 (L) 10/03/2022 0522   HGB 10.1 (L) 10/03/2022 0522   HCT 30.9 (L) 10/03/2022 0522   PLT 188 10/03/2022 0522   MCV 87.0 10/03/2022 0522   MCH 28.5 10/03/2022 0522   MCHC 32.7 10/03/2022 0522   RDW 14.4 10/03/2022 0522   LYMPHSABS 1.6 02/20/2020 1740   MONOABS 0.8 02/20/2020 1740   EOSABS 0.0 02/20/2020 1740   BASOSABS 0.0 02/20/2020 1740    BMET    Component Value Date/Time   NA 135 10/03/2022 0522   K 3.9 10/03/2022 0522   CL 104 10/03/2022 0522   CO2 23 10/03/2022 0522   GLUCOSE 98 10/03/2022 0522   BUN 30 (H) 10/03/2022 0522   CREATININE 1.69 (H) 10/03/2022 0522   CREATININE 1.38 (H) 10/03/2016 1107   CALCIUM 8.6 (L) 10/03/2022 0522   GFRNONAA 42 (L) 10/03/2022 0522   GFRAA 46 (L) 09/20/2016 0919    INR    Component Value Date/Time   INR 1.1 10/02/2022 0731     Intake/Output Summary (Last 24 hours) at 10/03/2022 0806 Last data filed at 10/03/2022 0414 Gross per 24 hour  Intake 1315.62 ml  Output 200 ml  Net 1115.62 ml     Assessment/Plan:  77 y.o. male is s/p R TCAR 1 Day Post-Op   No neuro events overnight R neck incision is well appearing; no obvious hematoma Continue aspirin, plavix, and statin; ok to resume Eliquis in 1 week Ok for discharge; office will arrange carotid duplex in 1 month   Emilie Rutter, [place] [facility] [phone] 10/03/2022 8:06 AM

## 2022-10-03 NOTE — Progress Notes (Signed)
Discharge instructions reviewed with pt and his grandson.  Copy of instructions given to pt. Pt informed his script for pain medication was sent to his pharmacy for pick up. Pt verbalized understanding of instructions and able to teach back, pt states he had same surgery on the other carotid.  Anesthesia MD came up to see pt's eye, and was advised to continue using eye drops post-discharge if needed. Pt was given instructions by anesthesia on when and how to take eye drops.  Pt to be d/c'd via wheelchair with belongings, with his grandson.           To be escorted by hospital volunteer.   Annice Needy, RN SWOT

## 2022-10-04 NOTE — Discharge Summary (Signed)
Discharge Summary     William Hatfield 1945/08/26 77 y.o. male  [id number]  Admission Date: 10/02/2022  Discharge Date: 10/03/22  Physician: Dr. Karin Lieu  Admission Diagnosis: Carotid artery stenosis [I65.29]  Discharge Day services:   See progress note 10/03/2022  Hospital Course:  [name] is a 77 year old male who was brought in as an outpatient on underwent right sided TCAR for asymptomatic high-grade stenosis of the right ICA.  He tolerated the procedure well and was admitted to the hospital postoperatively.  POD #1 the right neck incision was well-appearing with no obvious hematoma.  He will continue his aspirin, Plavix, statin daily.  We asked him to continue to hold his Eliquis for another week.  He will follow-up in the office in 1 month for a carotid duplex.  He was prescribed 2 to 3 days more of narcotic pain medication for continued postoperative pain control.  He was discharged home in stable condition with neuroexam at his baseline.   Recent Labs    10/03/22 0522  NA 135  K 3.9  CL 104  CO2 23  GLUCOSE 98  BUN 30*  CALCIUM 8.6*   Recent Labs    10/03/22 0522  WBC 12.9*  HGB 10.1*  HCT 30.9*  PLT 188   Recent Labs    10/02/22 0731  INR 1.1       Discharge Diagnosis:  Carotid artery stenosis [I65.29]  Secondary Diagnosis: Patient Active Problem List   Diagnosis Date Noted   Hematoma of neck 05/26/2022   Carotid artery stenosis 05/22/2022   PAF (paroxysmal atrial fibrillation) (HCC) 09/20/2016   Non-ST elevation (NSTEMI) myocardial infarction (HCC) 09/20/2016   CKD (chronic kidney disease)    Dyslipidemia 08/01/2013   Carotid artery disease (HCC)    Tremor    Hypertension    CAD (coronary artery disease)    Hx of CABG    Past Medical History:  Diagnosis Date   Anginal pain (HCC)    Arthritis    Atrial fibrillation (HCC)    a. post op in 2006  b. recurrence on 08/2016 admission for NSTEMI   CAD (coronary artery disease)    a. 2006:  s/p CABG at [facility] with LIMA to LAD, SVG to PDA, SVG to OM1, SVG to D1  b. 08/2016: NSTEMI 09/18/16 which showed severe native 3V CAD with patent SVG-->PLA, LIMA-->LAD and total occlusion of SVG--> OM1 with unsuccessful attempt at PCI of SVG--> OM with inability to restore flow.    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   allopurinol 100 MG tablet Commonly known as: ZYLOPRIM Take 100 mg by mouth daily.   apixaban 5 MG Tabs tablet Commonly known as: Eliquis Take 1 tablet (5 mg total) by mouth  2 (two) times daily. Start taking on: October 09, 2022 What changed: These instructions start on October 09, 2022. If you are unsure what to do until then, ask your doctor or other care provider.   aspirin EC 81 MG tablet Take 1 tablet (81 mg total) by mouth daily at 6 (six) AM. Swallow whole.   atorvastatin 40 MG tablet Commonly known as: LIPITOR TAKE 1 TABLET EVERY EVENING   clopidogrel 75 MG tablet Commonly known as: PLAVIX Take 1 tablet (75 mg total) by mouth daily.   CoQ-10 100 MG Caps Take 100 mg by mouth daily.   cyanocobalamin 500 MCG tablet Commonly known as: VITAMIN B12 Take 500 mcg by mouth daily.   empagliflozin 10 MG Tabs tablet Commonly known as: JARDIANCE Take 1 tablet (10 mg total) by mouth daily before breakfast.   furosemide 40 MG tablet Commonly known as: Lasix Take 1 tablet (40 mg total) by  mouth daily.   losartan 50 MG tablet Commonly known as: COZAAR Take 1 tablet (50 mg total) by mouth daily.   metoprolol tartrate 25 MG tablet Commonly known as: LOPRESSOR Take 1 tablet (25 mg total) by mouth 2 (two) times daily.   multivitamin tablet Take 1 tablet by mouth daily.   nitroGLYCERIN 0.4 MG SL tablet Commonly known as: NITROSTAT Place 1 tablet (0.4 mg total) under the tongue every 5 (five) minutes x 3 doses as needed for chest pain.   oxyCODONE-acetaminophen 5-325 MG tablet Commonly known as: PERCOCET/ROXICET Take 1 tablet by mouth every 6 (six) hours as needed for moderate pain.         Discharge Instructions:   [facility] Discharge Instructions Carotid Endarterectomy (CEA)  Please refer to the following instructions for your post-procedure care. Your surgeon or physician assistant will discuss any changes with you.  Activity  You are encouraged to walk as much as you can. You can slowly return to normal activities but must avoid strenuous activity and heavy lifting until your doctor tell you it's OK. Avoid activities such as vacuuming or swinging a golf club. You can drive after one week if you are comfortable and you are no longer taking prescription pain medications. It is normal to feel tired for serval weeks after your surgery. It is also normal to have difficulty with sleep habits, eating, and bowel movements after surgery. These will go away with time.  Bathing/Showering  You may shower after you come home. Do not soak in a bathtub, hot tub, or swim until the incision heals completely.  Incision Care  Shower every day. Clean your incision with mild soap and water. Pat the area dry with a clean towel. You do not need a bandage unless otherwise instructed. Do not apply any ointments or creams to your incision. You may have skin glue on your incision. Do not peel it off. It will come off on its own in about one week. Your  incision may feel thickened and raised for several weeks after your surgery. This is normal and the skin will soften over time. For Men Only: It's OK to shave around the incision but do not shave the incision itself for 2 weeks. It is common to have numbness under your chin that could last for several months.  Diet  Resume your normal diet. There are no special food restrictions following this procedure. A low fat/low cholesterol diet is recommended for all patients with vascular disease. In order to heal from your surgery, it  is CRITICAL to get adequate nutrition. Your body requires vitamins, minerals, and protein. Vegetables are the best source of vitamins and minerals. Vegetables also provide the perfect balance of protein. Processed food has little nutritional value, so try to avoid this.  Medications  Resume taking all of your medications unless your doctor or physician assistant tells you not to.  If your incision is causing pain, you may take over-the- counter pain relievers such as acetaminophen (Tylenol). If you were prescribed a stronger pain medication, please be aware these medications can cause nausea and constipation.  Prevent nausea by taking the medication with a snack or meal. Avoid constipation by drinking plenty of fluids and eating foods with a high amount of fiber, such as fruits, vegetables, and grains. Do not take Tylenol if you are taking prescription pain medications.  Follow Up  Our office will schedule a follow up appointment 2-3 weeks following discharge.  Please call us immediately for any of the following conditions  Increased pain, redness, drainage (pus) from your incision site. Fever of 101 degrees or higher. If you should develop stroke (slurred speech, difficulty swallowing, weakness on one side of your body, loss of vision) you should call 911 and go to the nearest emergency room.  Reduce your risk of vascular disease:  Stop smoking. If you would like help  call [facility] at 1-800-QUIT-NOW (330-660-1980) or [place] at [phone]. Manage your cholesterol Maintain a desired weight Control your diabetes Keep your blood pressure down  If you have any questions, please call the office at [phone].   Disposition: home  Patient's condition: is Good  Follow up: 1. PA in 4 weeks.   Emilie Rutter, PA-C [facility] [phone]   --- For [facility] Registry use ---   Modified Rankin score at D/C (0-6): 0  IV medication needed for:  1. Hypertension: No 2. Hypotension: No  Post-op Complications: No  1. Post-op CVA or TIA: No  If yes: Event classification (right eye, left eye, right cortical, left cortical, verterobasilar, other):   If yes: Timing of event (intra-op, <6 hrs post-op, >=6 hrs post-op, unknown):   2. CN injury: No  If yes: CN  injuried   3. Myocardial infarction: No  If yes: Dx by (EKG or clinical, Troponin):   4.  CHF: No  5.  Dysrhythmia (new): No  6. Wound infection: No  7. Reperfusion symptoms: No  8. Return to OR: No  If yes: return to OR for (bleeding, neurologic, other CEA incision, other):   Discharge medications: Statin use:  Yes ASA use:  Yes   Beta blocker use:  Yes ACE-Inhibitor use:  No  ARB use:  Yes CCB use: No P2Y12 Antagonist use: Yes, [x ] Plavix, [ ]  Plasugrel, [ ]  Ticlopinine, [ ]  Ticagrelor, [ ]  Other, [ ]  No for medical reason, [ ]  Non-compliant, [ ]  Not-indicated Anti-coagulant use:  Yes, [ ]  Warfarin, [ ]  Rivaroxaban, [ ]  Dabigatran, [x]  Eliquis

## 2022-10-09 ENCOUNTER — Ambulatory Visit (INDEPENDENT_AMBULATORY_CARE_PROVIDER_SITE_OTHER): Payer: Medicare Other

## 2022-10-09 DIAGNOSIS — I442 Atrioventricular block, complete: Secondary | ICD-10-CM

## 2022-10-23 NOTE — Progress Notes (Signed)
Remote pacemaker transmission.   

## 2022-10-24 ENCOUNTER — Other Ambulatory Visit: Payer: Self-pay | Admitting: *Deleted

## 2022-10-24 DIAGNOSIS — Z9889 Other specified postprocedural states: Secondary | ICD-10-CM

## 2022-10-27 ENCOUNTER — Other Ambulatory Visit: Payer: Self-pay | Admitting: Cardiology

## 2022-10-27 DIAGNOSIS — I480 Paroxysmal atrial fibrillation: Secondary | ICD-10-CM

## 2022-11-03 ENCOUNTER — Ambulatory Visit (HOSPITAL_COMMUNITY)
Admission: RE | Admit: 2022-11-03 | Discharge: 2022-11-03 | Disposition: A | Discharge status: Home / Self Care | Payer: Medicare Other | Source: Ambulatory Visit | Attending: Vascular Surgery

## 2022-11-03 ENCOUNTER — Ambulatory Visit (INDEPENDENT_AMBULATORY_CARE_PROVIDER_SITE_OTHER): Payer: Medicare Other | Admitting: Physician Assistant

## 2022-11-03 VITALS — BP 166/91 | HR 70 | Temp 97.7°F | Wt 199.0 lb

## 2022-11-03 DIAGNOSIS — Z9889 Other specified postprocedural states: Secondary | ICD-10-CM

## 2022-11-03 DIAGNOSIS — I6521 Occlusion and stenosis of right carotid artery: Secondary | ICD-10-CM

## 2022-11-03 NOTE — Progress Notes (Signed)
POST OPERATIVE OFFICE NOTE    CC:  F/u for surgery  HPI:  This is a 77 y.o. male who is s/p right sided TCAR due to high-grade asymptomatic stenosis of the right internal carotid artery by Dr. Karin Lieu on 10/02/2022.  He tolerated the procedure well and was discharged from the hospital on postoperative day #1.  He was on triple therapy with aspirin, Plavix, Eliquis for 1 month.  He has now discontinued the Plavix and is taking Eliquis and aspirin.  He denies any strokelike symptoms since surgery including slurring speech, changes in vision, or one-sided weakness.  He is also taking a daily statin.  He states his right neck incision has completely healed.  It should also be noted that he underwent left-sided carotid endarterectomy on 05/22/2022.  He however required hematoma evacuation on 05/26/2022.  Allergies  Allergen Reactions   Niaspan [Niacin] Hives and Itching    NIASPAN ONLY    Current Outpatient Medications  Medication Sig Dispense Refill   acetaminophen (TYLENOL) 500 MG tablet Take 1,000 mg by mouth every 6 (six) hours as needed for moderate pain or headache.     allopurinol (ZYLOPRIM) 100 MG tablet Take 100 mg by mouth daily.     apixaban (ELIQUIS) 5 MG TABS tablet Take 1 tablet (5 mg total) by mouth 2 (two) times daily. 60 tablet 6   aspirin EC 81 MG tablet Take 1 tablet (81 mg total) by mouth daily at 6 (six) AM. Swallow whole. 30 tablet 12   atorvastatin (LIPITOR) 40 MG tablet TAKE 1 TABLET EVERY EVENING 90 tablet 3   clopidogrel (PLAVIX) 75 MG tablet Take 1 tablet (75 mg total) by mouth daily. 35 tablet 6   Coenzyme Q10 (COQ-10) 100 MG CAPS Take 100 mg by mouth daily.     cyanocobalamin 500 MCG tablet Take 500 mcg by mouth daily.     empagliflozin (JARDIANCE) 10 MG TABS tablet Take 1 tablet (10 mg total) by mouth daily before breakfast. 30 tablet 6   furosemide (LASIX) 40 MG tablet Take 1 tablet (40 mg total) by mouth daily. 90 tablet 3   losartan (COZAAR) 50 MG tablet TAKE 1  TABLET EVERY DAY (DOSE INCREASED) 90 tablet 3   metoprolol tartrate (LOPRESSOR) 25 MG tablet TAKE 1 TABLET (25 MG TOTAL) BY MOUTH 2 (TWO) TIMES DAILY. 180 tablet 3   Multiple Vitamin (MULTIVITAMIN) tablet Take 1 tablet by mouth daily.     nitroGLYCERIN (NITROSTAT) 0.4 MG SL tablet Place 1 tablet (0.4 mg total) under the tongue every 5 (five) minutes x 3 doses as needed for chest pain. 25 tablet 3   No current facility-administered medications for this visit.     ROS:  See HPI  Physical Exam:  Vitals:   11/03/22 1420 11/03/22 1422  BP: (!) 162/82 (!) 166/91  Pulse: 70   Temp: 97.7 F (36.5 C)   TempSrc: Temporal   SpO2: 98%   Weight: 199 lb (90.3 kg)     Incision: Incisions of neck are well-healed Extremities: Moving all extremities well Neuro: Cranial nerves grossly intact  Assessment/Plan:  This is a 77 y.o. male who is s/p: Right sided TCAR  -Subjectively the patient has not experienced any neurological events since discharge from the hospital.  He has completely healed his right neck incision.  Carotid duplex demonstrates widely patent left carotid endarterectomy site as well as a widely patent right ICA stent.  He has completed his Plavix course and is now on aspirin, Eliquis,  statin daily.  We will repeat carotid duplex in 9 months per protocol.  He knows to call/return office sooner with any questions or concerns.   Emilie Rutter, PA-C [facility] [phone]  Clinic MD:  Karin Lieu

## 2022-11-11 ENCOUNTER — Other Ambulatory Visit: Payer: Self-pay | Admitting: Cardiology

## 2022-11-14 ENCOUNTER — Other Ambulatory Visit: Payer: Self-pay

## 2022-11-14 DIAGNOSIS — I6523 Occlusion and stenosis of bilateral carotid arteries: Secondary | ICD-10-CM

## 2022-12-14 ENCOUNTER — Other Ambulatory Visit: Payer: Self-pay | Admitting: Cardiology

## 2023-01-03 ENCOUNTER — Telehealth: Payer: Self-pay | Admitting: Cardiology

## 2023-01-03 NOTE — Telephone Encounter (Signed)
Patient is calling to inquire if he needs a f/u appt with Dr. Diona Browner after having procedure.  Requesting call back.

## 2023-01-03 NOTE — Telephone Encounter (Signed)
Pt scheduled for OD 41m f/u appt with provider for 03/06/23 at 8:40 am in the [place] office. Pt had TCAR on 10/02/22. Pt advised to continue f/u with [facility]. Pt had no questions or concerns at this time.

## 2023-01-04 ENCOUNTER — Other Ambulatory Visit: Payer: Self-pay | Admitting: Cardiology

## 2023-01-08 ENCOUNTER — Ambulatory Visit (INDEPENDENT_AMBULATORY_CARE_PROVIDER_SITE_OTHER): Payer: Medicare Other

## 2023-01-08 DIAGNOSIS — I442 Atrioventricular block, complete: Secondary | ICD-10-CM

## 2023-01-09 LAB — CUP PACEART REMOTE DEVICE CHECK
Battery Remaining Longevity: 115 mo
Battery Voltage: 3 V
Brady Statistic AP VP Percent: 1.89 %
Brady Statistic AP VS Percent: 0 %
Brady Statistic AS VP Percent: 97.9 %
Brady Statistic AS VS Percent: 0.21 %
Brady Statistic RA Percent Paced: 2.06 %
Brady Statistic RV Percent Paced: 99.79 %
Date Time Interrogation Session: 20241111001258
Implantable Lead Connection Status: 753985
Implantable Lead Connection Status: 753985
Implantable Lead Implant Date: 20211115
Implantable Lead Implant Date: 20211115
Implantable Lead Location: 753859
Implantable Lead Location: 753860
Implantable Lead Model: 5076
Implantable Lead Model: 7842
Implantable Lead Serial Number: 1057567
Implantable Pulse Generator Implant Date: 20211115
Lead Channel Impedance Value: 285 Ohm
Lead Channel Impedance Value: 456 Ohm
Lead Channel Impedance Value: 513 Ohm
Lead Channel Impedance Value: 646 Ohm
Lead Channel Pacing Threshold Amplitude: 0.625 V
Lead Channel Pacing Threshold Amplitude: 0.75 V
Lead Channel Pacing Threshold Pulse Width: 0.4 ms
Lead Channel Pacing Threshold Pulse Width: 0.4 ms
Lead Channel Sensing Intrinsic Amplitude: 0.875 mV
Lead Channel Sensing Intrinsic Amplitude: 0.875 mV
Lead Channel Sensing Intrinsic Amplitude: 24.75 mV
Lead Channel Sensing Intrinsic Amplitude: 24.75 mV
Lead Channel Setting Pacing Amplitude: 1.75 V
Lead Channel Setting Pacing Amplitude: 2.5 V
Lead Channel Setting Pacing Pulse Width: 0.4 ms
Lead Channel Setting Sensing Sensitivity: 1.2 mV
Zone Setting Status: 755011

## 2023-01-31 NOTE — Progress Notes (Signed)
Remote pacemaker transmission.   

## 2023-02-11 ENCOUNTER — Other Ambulatory Visit: Payer: Self-pay | Admitting: Cardiology

## 2023-03-06 ENCOUNTER — Encounter: Payer: Self-pay | Admitting: Cardiology

## 2023-03-06 ENCOUNTER — Ambulatory Visit: Payer: Medicare Other | Attending: Cardiology | Admitting: Cardiology

## 2023-03-06 VITALS — BP 118/74 | HR 76 | Ht 71.0 in | Wt 200.4 lb

## 2023-03-06 DIAGNOSIS — I10 Essential (primary) hypertension: Secondary | ICD-10-CM | POA: Diagnosis present

## 2023-03-06 DIAGNOSIS — I25119 Atherosclerotic heart disease of native coronary artery with unspecified angina pectoris: Secondary | ICD-10-CM | POA: Insufficient documentation

## 2023-03-06 DIAGNOSIS — I6523 Occlusion and stenosis of bilateral carotid arteries: Secondary | ICD-10-CM | POA: Insufficient documentation

## 2023-03-06 DIAGNOSIS — E782 Mixed hyperlipidemia: Secondary | ICD-10-CM | POA: Diagnosis present

## 2023-03-06 DIAGNOSIS — I480 Paroxysmal atrial fibrillation: Secondary | ICD-10-CM | POA: Diagnosis present

## 2023-03-06 MED ORDER — ISOSORBIDE MONONITRATE ER 30 MG PO TB24: 15.0000 mg | ORAL_TABLET | Freq: Every day | ORAL | 3 refills | Status: DC

## 2023-03-06 NOTE — Progress Notes (Signed)
 Cardiology Office Note  Date: 03/06/2023   ID: [name], DOB 14-Jan-1946, MRN [id number]  History of Present Illness: William Hatfield is a 78 y.o. male last seen in January 2024.  He is here for a follow-up visit.  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  States that he feels a rush and some shortness of breath when this happens.  No palpitations or syncope.  No specific change in stamina.  [employer] pacemaker in place, followed by Dr. Waddell.  Device check in November 2024 showed less than 1% AF burden.  I reviewed his medications.  Current cardiovascular regimen includes aspirin , Eliquis , Lipitor, Jardiance , Cozaar , Lopressor , Lasix , and as needed nitroglycerin .  He does not report any spontaneous bleeding problems.  Blood pressure is well-controlled today.  I reviewed lab work from August 2024 at which point his LDL was 51.  Physical Exam: VS:  BP 118/74   Pulse 76   Ht 5' 11 (1.803 m)   Wt 200 lb 6.4 oz (90.9 kg)   SpO2 98%   BMI 27.95 kg/m , BMI Body mass index is 27.95 kg/m.  Wt Readings from Last 3 Encounters:  03/06/23 200 lb 6.4 oz (90.9 kg)  11/03/22 199 lb (90.3 kg)  10/02/22 212 lb (96.2 kg)    General: Patient appears comfortable at rest. HEENT: Conjunctiva and lids normal. Neck: Supple, no elevated JVP or carotid bruits. Lungs: Clear to auscultation, nonlabored breathing at rest. Cardiac: Regular rate and rhythm, no S3 or significant systolic murmur, no pericardial rub. Extremities: No pitting edema.  ECG:  An ECG dated 05/26/2022 was personally reviewed today and demonstrated:  Dual-chamber pacing preceded by atypical atrial flutter with ventricular pacing.  Labwork: 09/26/2022: ALT 19; AST 20 10/03/2022: BUN 30; Creatinine, Ser 1.69; Hemoglobin 10.1; Platelets 188; Potassium 3.9; Sodium 135     Component Value Date/Time   CHOL 102 09/21/2021 1454   TRIG 98 09/21/2021 1454   HDL 25 (L) 09/21/2021 1454    CHOLHDL 4.1 09/21/2021 1454   VLDL 20 09/21/2021 1454   LDLCALC 57 09/21/2021 1454  August 2024: Cholesterol 126, triglycerides 213, HDL 32, LDL 51  Other Studies Reviewed Today:  No interval cardiac testing for review today.  Assessment and Plan:  1.  Multivessel CAD status post CABG at [facility] in 2006 with LIMA to LAD, SVG to PDA, SVG to OM1, and SVG to D1.  He has subsequently documented graft disease as of 2018 with occluded SVG to OM managed medically.  LVEF 60 to 65% as of 2021 evaluation.  He does report angina as described above, we will start Imdur  beginning at 15 mg daily and otherwise continue treatment including aspirin , Lopressor , Jardiance , and Lipitor.   2.  Mixed hyperlipidemia.  LDL 51 in August 2024, continue Lipitor.   3.  Paroxysmal atrial fibrillation with CHA2DS2-VASc score of 3.  No palpitations and very low rhythm burden based on last device interrogation in November 2024.  He continues on Eliquis  for stroke prophylaxis.  No spontaneous bleeding problems reported.   4.  Bilateral carotid artery disease, now status post TCAR of RICA in August 2024, underwent prior left CEA.  Follow-up carotid Dopplers in September 2024 revealed patent RICA stent site and 1 to 39% LICA stenosis.  Continues on aspirin  and Lipitor.  Keep follow-up with [facility].   5.  Primary hypertension.  Blood pressure is well-controlled today.  He is also on Cozaar .  Disposition:  Follow up  6 months.  Signed, [name] JUDITHANN Sierras, M.D., F.A.C.C. [facility] at [facility]

## 2023-03-06 NOTE — Patient Instructions (Signed)
 Medication Instructions:  Your physician has recommended you make the following change in your medication:  Start taking Imdur  15 mg daily in the evening Continue taking all other medications as prescribed  Labwork: None  Testing/Procedures: None  Follow-Up: Your physician recommends that you schedule a follow-up appointment in: 6 months  Any Other Special Instructions Will Be Listed Below (If Applicable). Thank you for choosing [facility]!     If you need a refill on your cardiac medications before your next appointment, please call your pharmacy.

## 2023-04-09 ENCOUNTER — Ambulatory Visit (INDEPENDENT_AMBULATORY_CARE_PROVIDER_SITE_OTHER): Payer: Medicare Other

## 2023-04-09 DIAGNOSIS — I442 Atrioventricular block, complete: Secondary | ICD-10-CM | POA: Diagnosis not present

## 2023-04-10 LAB — CUP PACEART REMOTE DEVICE CHECK
Battery Remaining Longevity: 111 mo
Battery Voltage: 3 V
Brady Statistic AP VP Percent: 0.94 %
Brady Statistic AP VS Percent: 0 %
Brady Statistic AS VP Percent: 99.03 %
Brady Statistic AS VS Percent: 0.03 %
Brady Statistic RA Percent Paced: 0.96 %
Brady Statistic RV Percent Paced: 99.97 %
Date Time Interrogation Session: 20250210045801
Implantable Lead Connection Status: 753985
Implantable Lead Connection Status: 753985
Implantable Lead Implant Date: 20211115
Implantable Lead Implant Date: 20211115
Implantable Lead Location: 753859
Implantable Lead Location: 753860
Implantable Lead Model: 5076
Implantable Lead Model: 7842
Implantable Lead Serial Number: 1057567
Implantable Pulse Generator Implant Date: 20211115
Lead Channel Impedance Value: 266 Ohm
Lead Channel Impedance Value: 437 Ohm
Lead Channel Impedance Value: 494 Ohm
Lead Channel Impedance Value: 608 Ohm
Lead Channel Pacing Threshold Amplitude: 0.75 V
Lead Channel Pacing Threshold Amplitude: 0.875 V
Lead Channel Pacing Threshold Pulse Width: 0.4 ms
Lead Channel Pacing Threshold Pulse Width: 0.4 ms
Lead Channel Sensing Intrinsic Amplitude: 1 mV
Lead Channel Sensing Intrinsic Amplitude: 1 mV
Lead Channel Sensing Intrinsic Amplitude: 26.75 mV
Lead Channel Sensing Intrinsic Amplitude: 26.75 mV
Lead Channel Setting Pacing Amplitude: 1.75 V
Lead Channel Setting Pacing Amplitude: 2.5 V
Lead Channel Setting Pacing Pulse Width: 0.4 ms
Lead Channel Setting Sensing Sensitivity: 1.2 mV
Zone Setting Status: 755011

## 2023-04-15 ENCOUNTER — Encounter: Payer: Self-pay | Admitting: Internal Medicine

## 2023-04-27 LAB — HEMOGLOBIN A1C: A1c: 5.9

## 2023-04-27 LAB — COMPREHENSIVE METABOLIC PANEL WITH GFR: EGFR (Non-African Amer.): 56

## 2023-04-27 LAB — PSA: PSA, Total: 1.43

## 2023-05-14 ENCOUNTER — Other Ambulatory Visit: Payer: Self-pay | Admitting: Cardiology

## 2023-05-14 NOTE — Addendum Note (Signed)
 Addended by: Geralyn Flash D on: 05/14/2023 01:44 PM   Modules accepted: Orders

## 2023-05-14 NOTE — Progress Notes (Signed)
 Remote pacemaker transmission.

## 2023-05-16 ENCOUNTER — Other Ambulatory Visit: Payer: Self-pay | Admitting: Cardiology

## 2023-07-09 ENCOUNTER — Ambulatory Visit (INDEPENDENT_AMBULATORY_CARE_PROVIDER_SITE_OTHER): Payer: Medicare Other

## 2023-07-09 DIAGNOSIS — I442 Atrioventricular block, complete: Secondary | ICD-10-CM | POA: Diagnosis not present

## 2023-07-10 LAB — CUP PACEART REMOTE DEVICE CHECK
Battery Remaining Longevity: 108 mo
Battery Voltage: 3 V
Brady Statistic AP VP Percent: 1.54 %
Brady Statistic AP VS Percent: 0 %
Brady Statistic AS VP Percent: 98.28 %
Brady Statistic AS VS Percent: 0.19 %
Brady Statistic RA Percent Paced: 1.62 %
Brady Statistic RV Percent Paced: 99.81 %
Date Time Interrogation Session: 20250512040700
Implantable Lead Connection Status: 753985
Implantable Lead Connection Status: 753985
Implantable Lead Implant Date: 20211115
Implantable Lead Implant Date: 20211115
Implantable Lead Location: 753859
Implantable Lead Location: 753860
Implantable Lead Model: 5076
Implantable Lead Model: 7842
Implantable Lead Serial Number: 1057567
Implantable Pulse Generator Implant Date: 20211115
Lead Channel Impedance Value: 266 Ohm
Lead Channel Impedance Value: 437 Ohm
Lead Channel Impedance Value: 513 Ohm
Lead Channel Impedance Value: 627 Ohm
Lead Channel Pacing Threshold Amplitude: 0.625 V
Lead Channel Pacing Threshold Amplitude: 1 V
Lead Channel Pacing Threshold Pulse Width: 0.4 ms
Lead Channel Pacing Threshold Pulse Width: 0.4 ms
Lead Channel Sensing Intrinsic Amplitude: 0.875 mV
Lead Channel Sensing Intrinsic Amplitude: 0.875 mV
Lead Channel Sensing Intrinsic Amplitude: 26.75 mV
Lead Channel Sensing Intrinsic Amplitude: 26.75 mV
Lead Channel Setting Pacing Amplitude: 2 V
Lead Channel Setting Pacing Amplitude: 2.5 V
Lead Channel Setting Pacing Pulse Width: 0.4 ms
Lead Channel Setting Sensing Sensitivity: 1.2 mV
Zone Setting Status: 755011

## 2023-07-16 ENCOUNTER — Ambulatory Visit: Payer: Self-pay | Admitting: Internal Medicine

## 2023-07-30 ENCOUNTER — Other Ambulatory Visit: Payer: Self-pay

## 2023-07-30 DIAGNOSIS — I6523 Occlusion and stenosis of bilateral carotid arteries: Secondary | ICD-10-CM

## 2023-08-14 ENCOUNTER — Other Ambulatory Visit: Payer: Self-pay | Admitting: Cardiology

## 2023-08-16 ENCOUNTER — Other Ambulatory Visit: Payer: Self-pay | Admitting: Cardiology

## 2023-08-16 ENCOUNTER — Ambulatory Visit (INDEPENDENT_AMBULATORY_CARE_PROVIDER_SITE_OTHER)

## 2023-08-16 ENCOUNTER — Ambulatory Visit (HOSPITAL_COMMUNITY)
Admission: RE | Admit: 2023-08-16 | Discharge: 2023-08-16 | Disposition: A | Discharge status: Home / Self Care | Source: Ambulatory Visit | Attending: Vascular Surgery | Admitting: Vascular Surgery

## 2023-08-16 VITALS — BP 147/82 | HR 76 | Temp 98.3°F | Ht 71.0 in | Wt 192.1 lb

## 2023-08-16 DIAGNOSIS — I6523 Occlusion and stenosis of bilateral carotid arteries: Secondary | ICD-10-CM

## 2023-08-16 DIAGNOSIS — I480 Paroxysmal atrial fibrillation: Secondary | ICD-10-CM
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
## 2023-08-22 NOTE — Addendum Note (Signed)
 Addended by: TAWNI DRILLING D on: 08/22/2023 03:12 PM   Modules accepted: Orders

## 2023-08-22 NOTE — Progress Notes (Signed)
 Remote pacemaker transmission.

## 2023-10-08 ENCOUNTER — Ambulatory Visit: Payer: Medicare Other

## 2023-10-08 DIAGNOSIS — I442 Atrioventricular block, complete: Secondary | ICD-10-CM | POA: Diagnosis not present

## 2023-10-09 LAB — CUP PACEART REMOTE DEVICE CHECK
Battery Remaining Longevity: 104 mo
Battery Voltage: 2.99 V
Brady Statistic AP VP Percent: 1.07 %
Brady Statistic AP VS Percent: 0 %
Brady Statistic AS VP Percent: 98.8 %
Brady Statistic AS VS Percent: 0.13 %
Brady Statistic RA Percent Paced: 1.13 %
Brady Statistic RV Percent Paced: 99.87 %
Date Time Interrogation Session: 20250811000002
Implantable Lead Connection Status: 753985
Implantable Lead Connection Status: 753985
Implantable Lead Implant Date: 20211115
Implantable Lead Implant Date: 20211115
Implantable Lead Location: 753859
Implantable Lead Location: 753860
Implantable Lead Model: 5076
Implantable Lead Model: 7842
Implantable Lead Serial Number: 1057567
Implantable Pulse Generator Implant Date: 20211115
Lead Channel Impedance Value: 266 Ohm
Lead Channel Impedance Value: 437 Ohm
Lead Channel Impedance Value: 513 Ohm
Lead Channel Impedance Value: 627 Ohm
Lead Channel Pacing Threshold Amplitude: 0.75 V
Lead Channel Pacing Threshold Amplitude: 1 V
Lead Channel Pacing Threshold Pulse Width: 0.4 ms
Lead Channel Pacing Threshold Pulse Width: 0.4 ms
Lead Channel Sensing Intrinsic Amplitude: 0.75 mV
Lead Channel Sensing Intrinsic Amplitude: 0.75 mV
Lead Channel Sensing Intrinsic Amplitude: 17.125 mV
Lead Channel Sensing Intrinsic Amplitude: 17.125 mV
Lead Channel Setting Pacing Amplitude: 2.25 V
Lead Channel Setting Pacing Amplitude: 2.5 V
Lead Channel Setting Pacing Pulse Width: 0.4 ms
Lead Channel Setting Sensing Sensitivity: 1.2 mV
Zone Setting Status: 755011

## 2023-10-10 ENCOUNTER — Ambulatory Visit: Payer: Self-pay | Admitting: Internal Medicine

## 2023-10-19 ENCOUNTER — Other Ambulatory Visit: Payer: Self-pay | Admitting: Cardiology

## 2023-10-29 ENCOUNTER — Other Ambulatory Visit: Payer: Self-pay | Admitting: Cardiology

## 2023-11-13 ENCOUNTER — Encounter: Payer: Self-pay | Admitting: Cardiology

## 2023-11-13 ENCOUNTER — Ambulatory Visit: Attending: Cardiology | Admitting: Cardiology

## 2023-11-13 VITALS — BP 138/70 | HR 87 | Ht 71.0 in | Wt 191.0 lb

## 2023-11-13 DIAGNOSIS — I6523 Occlusion and stenosis of bilateral carotid arteries: Secondary | ICD-10-CM | POA: Diagnosis present

## 2023-11-13 DIAGNOSIS — E782 Mixed hyperlipidemia: Secondary | ICD-10-CM | POA: Insufficient documentation

## 2023-11-13 DIAGNOSIS — I10 Essential (primary) hypertension: Secondary | ICD-10-CM | POA: Insufficient documentation

## 2023-11-13 DIAGNOSIS — I480 Paroxysmal atrial fibrillation: Secondary | ICD-10-CM | POA: Diagnosis not present

## 2023-11-13 DIAGNOSIS — I25119 Atherosclerotic heart disease of native coronary artery with unspecified angina pectoris: Secondary | ICD-10-CM | POA: Diagnosis not present

## 2023-11-13 MED ORDER — ISOSORBIDE MONONITRATE ER 30 MG PO TB24: 30.0000 mg | ORAL_TABLET | Freq: Every day | ORAL | 3 refills | Status: DC

## 2023-11-13 NOTE — Patient Instructions (Addendum)
 Medication Instructions:  Your physician has recommended you make the following change in your medication:  Increase isosorbide mononitrate to 30 mg daily Continue all other medications as prescribed  Labwork: none  Testing/Procedures: none  Follow-Up: Your physician recommends that you schedule a follow-up appointment in: 6 months  Any Other Special Instructions Will Be Listed Below (If Applicable).  If you need a refill on your cardiac medications before your next appointment, please call your pharmacy.

## 2023-11-13 NOTE — Progress Notes (Signed)
    Cardiology Office Note  Date: 11/13/2023   ID: Khaleb Broz, DOB Oct 29, 1945, MRN [id number]  History of Present Illness: William Hatfield is a 78 y.o. male last seen in January.  He is here for a routine visit.  He reports occasional nitroglycerin  use, no progressive symptoms.  He has been having trouble with his left knee and plans on further orthopedic consultation regarding possible surgery.  Otherwise NYHA class II dyspnea, no palpitations.  Medtronic pacemaker in place, followed by Dr. Waddell.  Device interrogation in August revealed normal function.  Medications reviewed.  He reports compliance with current therapy, no obvious intolerances.  He plans on lab work with PCP in the next week.  His LDL was 51 in August of last year.  I reviewed his ECG today which shows a ventricular paced rhythm with atrial sensing.  Physical Exam: VS:  BP 138/70   Pulse 87   Ht 5' 11 (1.803 m)   Wt 191 lb (86.6 kg)   SpO2 98%   BMI 26.64 kg/m , BMI Body mass index is 26.64 kg/m.  Wt Readings from Last 3 Encounters:  11/13/23 191 lb (86.6 kg)  08/16/23 192 lb 1.6 oz (87.1 kg)  03/06/23 200 lb 6.4 oz (90.9 kg)    General: Patient appears comfortable at rest. HEENT: Conjunctiva and lids normal. Neck: Supple, no elevated JVP or carotid bruits. Lungs: Clear to auscultation, nonlabored breathing at rest. Cardiac: Regular rate and rhythm, no S3 or significant systolic murmur. Extremities: No pitting edema, brace on left knee.  ECG:  An ECG dated 05/26/2022 was personally reviewed today and demonstrated:  Dual-chamber pacing preceded by atypical atrial flutter with ventricular pacing.  Labwork:  No interval lab work for review today.  Other Studies Reviewed Today:  No interval cardiac testing for review today.  Assessment and Plan:  1.  Multivessel CAD status post CABG at [facility] in 2006 with LIMA to LAD, SVG to PDA, SVG to OM1, and SVG to D1.  He has subsequently documented graft disease as of  2018 with occluded SVG to OM managed medically.  LVEF 60 to 65% as of 2021 evaluation.  Continue aspirin  81 mg daily, Lipitor 40 mg daily, Jardiance  10 mg daily, and increase Imdur  to 30 mg daily.  He has as needed nitroglycerin  available.   2.  Mixed hyperlipidemia.  LDL 51 in August 2024.  Planning on repeat lab work with PCP soon.  Continue Lipitor 40 mg daily for now.   3.  Paroxysmal atrial fibrillation with CHA2DS2-VASc score of 3.  No increasing palpitations.  Continue Eliquis  5 mg twice daily for stroke prophylaxis.  He is also on Lopressor  25 mg twice daily.   4.  Bilateral carotid artery disease, now status post TCAR of RICA in August 2024, underwent prior left CEA.  Carotid Dopplers in June indicated patent RICA stent site and 1 to 39% LICA stenosis.  He continues to follow with [facility].   5.  Primary hypertension.  Continue with present regimen as outlined above.  Disposition:  Follow up 6 months.  Signed, [name] JUDITHANN Sierras, M.D., F.A.C.C. [facility]

## 2023-11-14 ENCOUNTER — Other Ambulatory Visit: Payer: Self-pay | Admitting: Cardiology

## 2023-11-22 LAB — COMPREHENSIVE METABOLIC PANEL WITH GFR: EGFR: 53

## 2023-11-22 LAB — HEMOGLOBIN A1C
A1c: 5.8
TSH: 0.96

## 2023-11-23 NOTE — Progress Notes (Signed)
 Remote PPM Transmission

## 2023-11-28 ENCOUNTER — Encounter: Payer: Self-pay | Admitting: *Deleted

## 2023-11-29 ENCOUNTER — Ambulatory Visit: Attending: Internal Medicine | Admitting: Internal Medicine

## 2023-11-29 ENCOUNTER — Encounter: Payer: Self-pay | Admitting: Internal Medicine

## 2023-11-29 VITALS — BP 138/60 | HR 78 | Ht 71.0 in | Wt 188.0 lb

## 2023-11-29 DIAGNOSIS — I442 Atrioventricular block, complete: Secondary | ICD-10-CM | POA: Diagnosis present

## 2023-11-29 LAB — CUP PACEART INCLINIC DEVICE CHECK
Battery Remaining Longevity: 102 mo
Battery Voltage: 2.99 V
Brady Statistic AP VP Percent: 2.75 %
Brady Statistic AP VS Percent: 0 %
Brady Statistic AS VP Percent: 96.7 %
Brady Statistic AS VS Percent: 0.55 %
Brady Statistic RA Percent Paced: 3.16 %
Brady Statistic RV Percent Paced: 99.46 %
Date Time Interrogation Session: 20251002131831
Implantable Lead Connection Status: 753985
Implantable Lead Connection Status: 753985
Implantable Lead Implant Date: 20211115
Implantable Lead Implant Date: 20211115
Implantable Lead Location: 753859
Implantable Lead Location: 753860
Implantable Lead Model: 5076
Implantable Lead Model: 7842
Implantable Lead Serial Number: 1057567
Implantable Pulse Generator Implant Date: 20211115
Lead Channel Impedance Value: 266 Ohm
Lead Channel Impedance Value: 418 Ohm
Lead Channel Impedance Value: 494 Ohm
Lead Channel Impedance Value: 608 Ohm
Lead Channel Pacing Threshold Amplitude: 0.75 V
Lead Channel Pacing Threshold Amplitude: 1 V
Lead Channel Pacing Threshold Pulse Width: 0.4 ms
Lead Channel Pacing Threshold Pulse Width: 0.4 ms
Lead Channel Sensing Intrinsic Amplitude: 0.875 mV
Lead Channel Sensing Intrinsic Amplitude: 0.875 mV
Lead Channel Sensing Intrinsic Amplitude: 17.125 mV
Lead Channel Sensing Intrinsic Amplitude: 17.125 mV
Lead Channel Setting Pacing Amplitude: 2 V
Lead Channel Setting Pacing Amplitude: 2.5 V
Lead Channel Setting Pacing Pulse Width: 0.4 ms
Lead Channel Setting Sensing Sensitivity: 1.2 mV
Zone Setting Status: 755011

## 2023-11-29 NOTE — Progress Notes (Signed)
  Cardiology Office Note   Date:  11/29/2023  ID:  William Hatfield, DOB 1945/05/29, MRN [id number] PCP: William Anes, DO  [facility] Providers Cardiologist:  William Sierras, MD Electrophysiologist:  William DELENA Primus, MD   History of Present Illness William Hatfield is a 78 y.o. 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  He denies any recent chest pain, shortness of breath, syncope or presyncope.  He has had no recent palpitations.  He overall is doing well.  ROS: none  Studies Reviewed  ECG review 11/13/23: ASVP 86, PR 216, QRS 148, QT/c 428/512 05/26/22: APVP 60, PR 172, QRS 135, QT/c 465/465  Risk Assessment/Calculations  CHA2DS2-VASc Score = 3  This indicates a 3.2% annual risk of stroke. The patient's score is based upon: CHF History: 0 HTN History: 1 Diabetes History: 0 Stroke History: 0 Vascular Disease History: 0 Age Score: 2 Gender Score: 0  Physical Exam VS:  BP 138/60   Pulse 78   Ht 5' 11 (1.803 m)   Wt 188 lb (85.3 kg)   SpO2 96%   BMI 26.22 kg/m       Wt Readings from Last 3 Encounters:  11/29/23 188 lb (85.3 kg)  11/13/23 191 lb (86.6 kg)  08/16/23 192 lb 1.6 oz (87.1 kg)    GEN: Well nourished, well developed in no acute distress CARDIAC: RRR, no murmurs, rubs, gallops RESPIRATORY:  Clear to auscultation without rales, wheezing or rhonchi  EXTREMITIES:  No edema; No deformity   Device information PPM: MDT Azure XT DR MRI [id number], DOI 01/12/20 RA: MDT 5076 CapSureFix Novus, SN [id number], DOI 01/12/20 RV: Hlpijwu 2157, SN [id number], DOI 01/12/20  Device interrogation  Result date: 11/29/23 Battery longevity: 8.5 years AS/VP 79, AF 0.5%, no underlying >30  Device dependent DDD 60/120 RA: 0.9 mV / 494 ohms / 0.75 V @ 0.4 ms  RV: 17.1 mV / 608 ohms / 075 V @ 0.4 ms  Brief episodes NSVT  ASSESSMENT AND PLAN William Hatfield is a 78 y.o. 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  CHB s/p LEFT MDT DC PPM  Paroxysmal AF Device stable function.  Patient asymptomatic.  No changes made today.  Will discontinue aspirin  while on apixaban .  No indications for both OAC and an AP treatment with distant past CAD/CABG.   Dispo: RTC 1 year  A total of 20 minutes was spent preparing for the patient, reviewing history, performing exam, document encounter, coordinating care and counseling the patient. 10 minutes was spent with direct patient care.   Signed, William DELENA Primus, MD

## 2023-11-29 NOTE — Addendum Note (Signed)
 Addended by: Kamri Gotsch A on: 11/29/2023 08:54 PM   Modules accepted: Orders

## 2023-11-29 NOTE — Progress Notes (Signed)
 Dr. Almetta saw this patient. Please refer to his note.   William Rosalind Guido,MD

## 2023-11-29 NOTE — Patient Instructions (Signed)

## 2023-12-14 ENCOUNTER — Telehealth: Payer: Self-pay

## 2023-12-14 NOTE — Telephone Encounter (Signed)
 Dr. Debera,  You saw this patient on 11/13/2023 for general cardiology visit. Per protocol we request that you comment on his cardiac risk to proceed with Left total knee replacement  since it has been less than 2 months since evaluated in the office. I have reached out to pharmacy RE: Eliquis  hold.Please send your comment to P CV Pre-Op  Pool.  Thank you, Lamarr Satterfield DNP, ANP, AACC.

## 2023-12-14 NOTE — Telephone Encounter (Signed)
 Pharmacy please advise on holding Eliquis  prior to  Left total knee replacement scheduled for TBD. Thank you.

## 2023-12-14 NOTE — Telephone Encounter (Signed)
   Pre-operative Risk Assessment    Patient Name: Markeis Allman  DOB: 1945-11-06 MRN: [id number]   Date of last office visit: 11/29/23 Date of next office visit: 05/07/24   Request for Surgical Clearance    Procedure:  Left total knee replacement  Date of Surgery:  Clearance TBD                                Surgeon:  Dr. Reyes Billing Surgeon's Group or Practice Name:  [facility] Phone number:  [phone] Fax number:  [phone]   Type of Clearance Requested:   - Medical  - Pharmacy:  Hold Apixaban  (Eliquis ) (Patient on Eliquis , hold not requested on form)   Type of Anesthesia:  Not Indicated   Additional requests/questions:    Bonney Ival LOISE Gerome   12/14/2023, 10:23 AM

## 2023-12-19 NOTE — Telephone Encounter (Signed)
 I will forward back to preop APP to review notes from DR. McDowell as well as if any further notes needed.

## 2023-12-19 NOTE — Telephone Encounter (Signed)
 Per Dr. Debera Chart reviewed. He was clinically stable from a cardiac perspective at recent office visit in September. RCRI perioperative cardiac risk index estimates 1.1% chance of major adverse cardiac event, low risk. He should be able to proceed with knee replacement without additional cardiac testing presuming his clinical status has not changed significantly since I saw him. Would anticipate hold on Eliquis  48 hours pending review of Pharm.D.   Waiting on Pharm D.

## 2023-12-19 NOTE — Telephone Encounter (Signed)
 Daughter Ok) wants a call back on the status of patient's clearance for knee surgery.

## 2023-12-20 NOTE — Telephone Encounter (Signed)
 DR. Cristi office inquiring if pt has been cleared. Looks like just waiting on pharm-d recommendations, see notes.   I will reach out to the pharm-d

## 2023-12-20 NOTE — Telephone Encounter (Signed)
 Daughter is following up requesting updates.

## 2023-12-20 NOTE — Telephone Encounter (Signed)
   Patient Name: William Hatfield  DOB: [birth year] MRN: [id number]  Primary Cardiologist: [name] Sierras, MD  Chart reviewed as part of pre-operative protocol coverage.Per Dr. Dava, chart reviewed. He was clinically stable from a cardiac perspective at recent office visit in September. RCRI perioperative cardiac risk index estimates 1.1% chance of major adverse cardiac event, low risk. He should be able to proceed with knee replacement without additional cardiac testing presuming his clinical status has not changed significantly since I saw him.  Therefore, given past medical history and time since last visit, based on ACC/AHA guidelines, Elhadji Pecore is at acceptable risk for the planned procedure without further cardiovascular testing.   Per office protocol, patient can hold Eliquis  for 3 days prior to procedure. Please resume Eliquis  as soon as possible postprocedure, at the discretion of the surgeon.   I will route this recommendation to the requesting party via Epic fax function and remove from pre-op  pool.  Please call with questions.  Damien [name] Braver, NP 12/20/2023, 4:45 PM

## 2023-12-20 NOTE — Telephone Encounter (Signed)
 Patient with diagnosis of afib on Eliquis  for anticoagulation.    Procedure: Left total knee replacement  Date of procedure: TBD   CHA2DS2-VASc Score = 4   This indicates a 4.8% annual risk of stroke. The patient's score is based upon: CHF History: 0 HTN History: 1 Diabetes History: 0 Stroke History: 0 Vascular Disease History: 1 Age Score: 2 Gender Score: 0      CrCl 55 ml/min Platelet count 248  Patient has not had an Afib/aflutter ablation in the last 3 months, DCCV within the last 4 weeks or a watchman implanted in the last 45 days   Per office protocol, patient can hold Eliquis  for 3 days prior to procedure.    **This guidance is not considered finalized until pre-operative APP has relayed final recommendations.**

## 2023-12-24 ENCOUNTER — Ambulatory Visit: Payer: Self-pay | Admitting: Orthopedic Surgery

## 2024-01-07 ENCOUNTER — Ambulatory Visit: Payer: Medicare Other

## 2024-01-07 ENCOUNTER — Encounter: Payer: Self-pay | Admitting: Student in an Organized Health Care Education/Training Program

## 2024-01-07 DIAGNOSIS — I442 Atrioventricular block, complete: Secondary | ICD-10-CM | POA: Diagnosis not present

## 2024-01-07 NOTE — Patient Instructions (Signed)
 SURGICAL WAITING ROOM VISITATION Patients having surgery or a procedure may have no more than 2 support people in the waiting area - these visitors may rotate in the visitor waiting room.   Due to an increase in RSV and influenza rates and associated hospitalizations, children ages 89 and under may not visit patients in [facility] hospitals. If the patient needs to stay at the hospital during part of their recovery, the visitor guidelines for inpatient rooms apply.  PRE-OP  VISITATION  Pre-op  nurse will coordinate an appropriate time for 1 support person to accompany the patient in pre-op .  This support person may not rotate.  This visitor will be contacted when the time is appropriate for the visitor to come back in the pre-op  area.  Please refer to the [facility] website for the visitor guidelines for Inpatients (after your surgery is over and you are in a regular room).  You are not required to quarantine at this time prior to your surgery. However, you must do this: Hand Hygiene often Do NOT share personal items Notify your provider if you are in close contact with someone who has COVID or you develop fever 100.4 or greater, new onset of sneezing, cough, sore throat, shortness of breath or body aches.  If you test positive for Covid or have been in contact with anyone that has tested positive in the last 10 days please notify you surgeon.    Your procedure is scheduled on:  01/16/24  Report to [facility] Main Entrance: [place] entrance where the [employer] is available.   Report to admitting at:6:00 AM  Call this number if you have any questions or problems the morning of surgery [phone]  FOLLOW ANY ADDITIONAL PRE OP INSTRUCTIONS YOU RECEIVED FROM YOUR SURGEON'S OFFICE!!!  Do not eat food after Midnight the night prior to your surgery/procedure.  After Midnight you may have the following liquids until: 5:30 AM DAY OF SURGERY  Clear Liquid Diet Water  Black  Coffee (sugar ok, NO MILK/CREAM OR CREAMERS)  Tea (sugar ok, NO MILK/CREAM OR CREAMERS) regular and decaf                             Plain Jell-O  with no fruit (NO RED)                                           Fruit ices (not with fruit pulp, NO RED)                                     Popsicles (NO RED)                                                                  Juice: NO CITRUS JUICES: only apple, WHITE grape, WHITE cranberry Sports drinks like Gatorade or Powerade (NO RED)   The day of surgery:  Drink ONE (1) Pre-Surgery Clear Ensure at : 5:30 AM the morning of surgery. Drink in one sitting. Do not sip.  This drink was given to  you during your hospital pre-op  appointment visit. Nothing else to drink after completing the Pre-Surgery Clear Ensure or G2 : No candy, chewing gum or throat lozenges.    Oral Hygiene is also important to reduce your risk of infection.        Remember - BRUSH YOUR TEETH THE MORNING OF SURGERY WITH YOUR REGULAR TOOTHPASTE  Do NOT smoke after Midnight the night before surgery.  STOP TAKING all Vitamins, Herbs and supplements 1 week before your surgery.   Take ONLY these medicines the morning of surgery with A SIP OF WATER : isosorbide ,metoprolol ,allopurinol .Tylenol  as needed.  If You have been diagnosed with Sleep Apnea - Bring CPAP mask and tubing day of surgery. We will provide you with a CPAP machine on the day of your surgery.                   You may not have any metal on your body including hair pins, jewelry, and body piercing  Do not wear lotions, powders, perfumes / cologne, or deodorant.   Men may shave face and neck.  Contacts, Hearing Aids, dentures or bridgework may not be worn into surgery. DENTURES WILL BE REMOVED PRIOR TO SURGERY PLEASE DO NOT APPLY Poly grip OR ADHESIVES!!!  You may bring a small overnight bag with you on the day of surgery, only pack items that are not valuable.  IS NOT RESPONSIBLE   FOR VALUABLES THAT  ARE LOST OR STOLEN.   Patients discharged on the day of surgery will not be allowed to drive home.  Someone NEEDS to stay with you for the first 24 hours after anesthesia.  Do not bring your home medications to the hospital. The Pharmacy will dispense medications listed on your medication list to you during your admission in the Hospital.  Special Instructions: Bring a copy of your healthcare power of attorney and living will documents the day of surgery, if you wish to have them scanned into your [place] [facility] Records- EPIC  Please read over the following fact sheets you were given: IF YOU HAVE QUESTIONS ABOUT YOUR PRE-OP  INSTRUCTIONS, PLEASE CALL [phone]   [facility] - Preparing for Surgery      Before surgery, you can play an important role.  Because skin is not sterile, your skin needs to be as free of germs as possible.  You can reduce the number of germs on your skin by washing with CHG (chlorahexidine gluconate) soap before surgery.  CHG is an antiseptic cleaner which kills germs and bonds with the skin to continue killing germs even after washing. Please DO NOT use if you have an allergy to CHG or antibacterial soaps.  If your skin becomes reddened/irritated stop using the CHG and inform your nurse when you arrive at Short Stay. Do not shave (including legs and underarms) for at least 48 hours prior to the first CHG shower.  You may shave your face/neck.  Please follow these instructions carefully:  1.  Shower with CHG Soap the night before surgery ONLY (DO NOT USE THE SOAP THE MORNING OF SURGERY).  2.  If you choose to wash your hair, wash your hair first as usual with your normal  shampoo.  3.  After you shampoo, rinse your hair and body thoroughly to remove the shampoo.                             4.  Use CHG as you  would any other liquid soap.  You can apply chg directly to the skin and wash.  Gently with a scrungie or clean washcloth.  5.  Apply the CHG Soap to your body  ONLY FROM THE NECK DOWN.   Do not use on face/ open                           Wound or open sores. Avoid contact with eyes, ears mouth and genitals (private parts).                       Wash face,  Genitals (private parts) with your normal soap.             6.  Wash thoroughly, paying special attention to the area where your  surgery  will be performed.  7.  Thoroughly rinse your body with warm water  from the neck down.  8.  DO NOT shower/wash with your normal soap after using and rinsing off the CHG Soap.                9.  Pat yourself dry with a clean towel.            10.  Wear clean pajamas.            11.  Place clean sheets on your bed the night of your first shower and do not  sleep with pets.  Day of Surgery : Do not apply any CHG, lotions/deodorants the morning of surgery.  Please wear clean clothes to the hospital/surgery center.   FAILURE TO FOLLOW THESE INSTRUCTIONS MAY RESULT IN THE CANCELLATION OF YOUR SURGERY  PATIENT SIGNATURE_________________________________  NURSE SIGNATURE__________________________________  ________________________________________________________________________  Pre-operative 4 CHG Bath Instructions  DYNA-Hex 4 Chlorhexidine  Gluconate 4% Solution Antiseptic 4 fl. oz   You can play a key role in reducing the risk of infection after surgery. Your skin needs to be as free of germs as possible. You can reduce the number of germs on your skin by washing with CHG (chlorhexidine  gluconate) soap before surgery. CHG is an antiseptic soap that kills germs and continues to kill germs even after washing.   DO NOT use if you have an allergy to chlorhexidine /CHG or antibacterial soaps. If your skin becomes reddened or irritated, stop using the CHG and notify one of our RNs at   Please shower with the CHG soap starting 4 days before surgery using the following schedule:     Please keep in mind the following:  DO NOT shave, including legs and underarms,  starting the day of your first shower.   You may shave your face at any point before/day of surgery.  Place clean sheets on your bed the day you start using CHG soap. Use a clean washcloth (not used since being washed) for each shower. DO NOT sleep with pets once you start using the CHG.  CHG Shower Instructions:  If you choose to wash your hair and private area, wash first with your normal shampoo/soap.  After you use shampoo/soap, rinse your hair and body thoroughly to remove shampoo/soap residue.  Turn the water  OFF and apply about 3 tablespoons (45 ml) of CHG soap to a CLEAN washcloth.  Apply CHG soap ONLY FROM YOUR NECK DOWN TO YOUR TOES (washing for 3-5 minutes)  DO NOT use CHG soap on face, private areas, open wounds, or sores.  Pay special attention to the area where your  surgery is being performed.  If you are having back surgery, having someone wash your back for you may be helpful. Wait 2 minutes after CHG soap is applied, then you may rinse off the CHG soap.  Pat dry with a clean towel  Put on clean clothes/pajamas   If you choose to wear lotion, please use ONLY the CHG-compatible lotions on the back of this paper.     Additional instructions for the day of surgery: DO NOT APPLY any lotions, deodorants, cologne, or perfumes.   Put on clean/comfortable clothes.  Brush your teeth.  Ask your nurse before applying any prescription medications to the skin.   CHG Compatible Lotions   Aveeno Moisturizing lotion  Cetaphil Moisturizing Cream  Cetaphil Moisturizing Lotion  Clairol Herbal Essence Moisturizing Lotion, Dry Skin  Clairol Herbal Essence Moisturizing Lotion, Extra Dry Skin  Clairol Herbal Essence Moisturizing Lotion, Normal Skin  Curel Age Defying Therapeutic Moisturizing Lotion with Alpha Hydroxy  Curel Extreme Care Body Lotion  Curel Soothing Hands Moisturizing Hand Lotion  Curel Therapeutic Moisturizing Cream, Fragrance-Free  Curel Therapeutic Moisturizing Lotion,  Fragrance-Free  Curel Therapeutic Moisturizing Lotion, Original Formula  Eucerin Daily Replenishing Lotion  Eucerin Dry Skin Therapy Plus Alpha Hydroxy Crme  Eucerin Dry Skin Therapy Plus Alpha Hydroxy Lotion  Eucerin Original Crme  Eucerin Original Lotion  Eucerin Plus Crme Eucerin Plus Lotion  Eucerin TriLipid Replenishing Lotion  Keri Anti-Bacterial Hand Lotion  Keri Deep Conditioning Original Lotion Dry Skin Formula Softly Scented  Keri Deep Conditioning Original Lotion, Fragrance Free Sensitive Skin Formula  Keri Lotion Fast Absorbing Fragrance Free Sensitive Skin Formula  Keri Lotion Fast Absorbing Softly Scented Dry Skin Formula  Keri Original Lotion  Keri Skin Renewal Lotion Keri Silky Smooth Lotion  Keri Silky Smooth Sensitive Skin Lotion  Nivea Body Creamy Conditioning Oil  Nivea Body Extra Enriched Lotion  Nivea Body Original Lotion  Nivea Body Sheer Moisturizing Lotion Nivea Crme  Nivea Skin Firming Lotion  NutraDerm 30 Skin Lotion  NutraDerm Skin Lotion  NutraDerm Therapeutic Skin Cream  NutraDerm Therapeutic Skin Lotion  ProShield Protective Hand Cream  Provon moisturizing lotion  Incentive Spirometer  An incentive spirometer is a tool that can help keep your lungs clear and active. This tool measures how well you are filling your lungs with each breath. Taking long deep breaths may help reverse or decrease the chance of developing breathing (pulmonary) problems (especially infection) following: A long period of time when you are unable to move or be active. BEFORE THE PROCEDURE  If the spirometer includes an indicator to show your best effort, your nurse or respiratory therapist will set it to a desired goal. If possible, sit up straight or lean slightly forward. Try not to slouch. Hold the incentive spirometer in an upright position. INSTRUCTIONS FOR USE  Sit on the edge of your bed if possible, or sit up as far as you can in bed or on a chair. Hold the  incentive spirometer in an upright position. Breathe out normally. Place the mouthpiece in your mouth and seal your lips tightly around it. Breathe in slowly and as deeply as possible, raising the piston or the ball toward the top of the column. Hold your breath for 3-5 seconds or for as long as possible. Allow the piston or ball to fall to the bottom of the column. Remove the mouthpiece from your mouth and breathe out normally. Rest for a few seconds and repeat Steps 1 through 7 at least 10 times  every 1-2 hours when you are awake. Take your time and take a few normal breaths between deep breaths. The spirometer may include an indicator to show your best effort. Use the indicator as a goal to work toward during each repetition. After each set of 10 deep breaths, practice coughing to be sure your lungs are clear. If you have an incision (the cut made at the time of surgery), support your incision when coughing by placing a pillow or rolled up towels firmly against it. Once you are able to get out of bed, walk around indoors and cough well. You may stop using the incentive spirometer when instructed by your caregiver.  RISKS AND COMPLICATIONS Take your time so you do not get dizzy or light-headed. If you are in pain, you may need to take or ask for pain medication before doing incentive spirometry. It is harder to take a deep breath if you are having pain. AFTER USE Rest and breathe slowly and easily. It can be helpful to keep track of a log of your progress. Your caregiver can provide you with a simple table to help with this. If you are using the spirometer at home, follow these instructions: SEEK MEDICAL CARE IF:  You are having difficultly using the spirometer. You have trouble using the spirometer as often as instructed. Your pain medication is not giving enough relief while using the spirometer. You develop fever of 100.5 F (38.1 C) or higher. SEEK IMMEDIATE MEDICAL CARE IF:  You cough  up bloody sputum that had not been present before. You develop fever of 102 F (38.9 C) or greater. You develop worsening pain at or near the incision site. MAKE SURE YOU:  Understand these instructions. Will watch your condition. Will get help right away if you are not doing well or get worse. Document Released: 06/26/2006 Document Revised: 05/08/2011 Document Reviewed: 08/27/2006 [facility] Patient Information 2014 [place], [place].   ________________________________________________________________________

## 2024-01-07 NOTE — Progress Notes (Signed)
 PERIOPERATIVE PRESCRIPTION FOR IMPLANTED CARDIAC DEVICE PROGRAMMING  Patient Information: Name:  William Hatfield  DOB:  19-Jun-1945  MRN:  [id number]  Planned Procedure:  Left Total knee.  Surgeon:  Dr. Reyes Billing  Date of Procedure:  01/16/24  Cautery will be used.  Position during surgery:     Device Information:  Clinic EP Physician:  Dr. Donnice Primus  Device Type:  Pacemaker Manufacturer and Phone #:  Medtronic: [phone] Pacemaker Dependent?:  Yes.   Date of Last Device Check:  01/07/2024 Normal Device Function?:  Yes.    Electrophysiologist's Recommendations:  Have magnet available. Provide continuous ECG monitoring when magnet is used or reprogramming is to be performed.  Procedure should not interfere with device function.  No device programming or magnet placement needed.  Per Device Clinic Standing Orders, William DELENA Sharps, RN  4:27 PM 01/07/2024

## 2024-01-08 ENCOUNTER — Encounter (HOSPITAL_COMMUNITY): Payer: Self-pay

## 2024-01-08 ENCOUNTER — Other Ambulatory Visit: Payer: Self-pay

## 2024-01-08 ENCOUNTER — Encounter (HOSPITAL_COMMUNITY)
Admission: RE | Admit: 2024-01-08 | Discharge: 2024-01-08 | Disposition: A | Discharge status: Home / Self Care | Source: Ambulatory Visit | Attending: Specialist | Admitting: Specialist

## 2024-01-08 VITALS — BP 148/73 | HR 75 | Temp 97.7°F | Ht 71.0 in | Wt 187.0 lb

## 2024-01-08 DIAGNOSIS — I10 Essential (primary) hypertension: Secondary | ICD-10-CM | POA: Insufficient documentation

## 2024-01-08 DIAGNOSIS — Z9889 Other specified postprocedural states: Secondary | ICD-10-CM | POA: Diagnosis not present

## 2024-01-08 DIAGNOSIS — Z01818 Encounter for other preprocedural examination: Secondary | ICD-10-CM

## 2024-01-08 DIAGNOSIS — I739 Peripheral vascular disease, unspecified: Secondary | ICD-10-CM | POA: Insufficient documentation

## 2024-01-08 DIAGNOSIS — M1712 Unilateral primary osteoarthritis, left knee: Secondary | ICD-10-CM | POA: Insufficient documentation

## 2024-01-08 DIAGNOSIS — I480 Paroxysmal atrial fibrillation: Secondary | ICD-10-CM | POA: Diagnosis not present

## 2024-01-08 DIAGNOSIS — Z951 Presence of aortocoronary bypass graft: Secondary | ICD-10-CM | POA: Insufficient documentation

## 2024-01-08 DIAGNOSIS — Z7901 Long term (current) use of anticoagulants: Secondary | ICD-10-CM | POA: Insufficient documentation

## 2024-01-08 DIAGNOSIS — Z950 Presence of cardiac pacemaker: Secondary | ICD-10-CM | POA: Diagnosis not present

## 2024-01-08 DIAGNOSIS — I2510 Atherosclerotic heart disease of native coronary artery without angina pectoris: Secondary | ICD-10-CM | POA: Diagnosis not present

## 2024-01-08 DIAGNOSIS — Z01812 Encounter for preprocedural laboratory examination: Secondary | ICD-10-CM | POA: Diagnosis not present

## 2024-01-08 HISTORY — DX: Malignant (primary) neoplasm, unspecified: C80.1

## 2024-01-08 LAB — CBC
HCT: 38.3 % — ABNORMAL LOW (ref 39.0–52.0)
Hemoglobin: 12.3 g/dL — ABNORMAL LOW (ref 13.0–17.0)
MCH: 29.8 pg (ref 26.0–34.0)
MCHC: 32.1 g/dL (ref 30.0–36.0)
MCV: 92.7 fL (ref 80.0–100.0)
Platelets: 221 K/uL (ref 150–400)
RBC: 4.13 MIL/uL — ABNORMAL LOW (ref 4.22–5.81)
RDW: 14.2 % (ref 11.5–15.5)
WBC: 6.7 K/uL (ref 4.0–10.5)
nRBC: 0 % (ref 0.0–0.2)

## 2024-01-08 LAB — CUP PACEART REMOTE DEVICE CHECK
Battery Remaining Longevity: 100 mo
Battery Voltage: 2.99 V
Brady Statistic AP VP Percent: 0.74 %
Brady Statistic AP VS Percent: 0 %
Brady Statistic AS VP Percent: 99.19 %
Brady Statistic AS VS Percent: 0.07 %
Brady Statistic RA Percent Paced: 0.76 %
Brady Statistic RV Percent Paced: 99.93 %
Date Time Interrogation Session: 20251110035147
Implantable Lead Connection Status: 753985
Implantable Lead Connection Status: 753985
Implantable Lead Implant Date: 20211115
Implantable Lead Implant Date: 20211115
Implantable Lead Location: 753859
Implantable Lead Location: 753860
Implantable Lead Model: 5076
Implantable Lead Model: 7842
Implantable Lead Serial Number: 1057567
Implantable Pulse Generator Implant Date: 20211115
Lead Channel Impedance Value: 266 Ohm
Lead Channel Impedance Value: 418 Ohm
Lead Channel Impedance Value: 475 Ohm
Lead Channel Impedance Value: 589 Ohm
Lead Channel Pacing Threshold Amplitude: 0.75 V
Lead Channel Pacing Threshold Amplitude: 1 V
Lead Channel Pacing Threshold Pulse Width: 0.4 ms
Lead Channel Pacing Threshold Pulse Width: 0.4 ms
Lead Channel Sensing Intrinsic Amplitude: 0.75 mV
Lead Channel Sensing Intrinsic Amplitude: 0.75 mV
Lead Channel Sensing Intrinsic Amplitude: 17.125 mV
Lead Channel Sensing Intrinsic Amplitude: 17.125 mV
Lead Channel Setting Pacing Amplitude: 2 V
Lead Channel Setting Pacing Amplitude: 2.5 V
Lead Channel Setting Pacing Pulse Width: 0.4 ms
Lead Channel Setting Sensing Sensitivity: 1.2 mV
Zone Setting Status: 755011

## 2024-01-08 LAB — SURGICAL PCR SCREEN
MRSA, PCR: NEGATIVE
Staphylococcus aureus: NEGATIVE

## 2024-01-08 LAB — BASIC METABOLIC PANEL WITH GFR
Anion gap: 9 (ref 5–15)
BUN: 26 mg/dL — ABNORMAL HIGH (ref 8–23)
CO2: 27 mmol/L (ref 22–32)
Calcium: 10 mg/dL (ref 8.9–10.3)
Chloride: 104 mmol/L (ref 98–111)
Creatinine, Ser: 1.33 mg/dL — ABNORMAL HIGH (ref 0.61–1.24)
GFR, Estimated: 55 mL/min — ABNORMAL LOW (ref >60)
Glucose, Bld: 106 mg/dL — ABNORMAL HIGH (ref 70–99)
Potassium: 4.9 mmol/L (ref 3.5–5.1)
Sodium: 140 mmol/L (ref 135–145)

## 2024-01-08 NOTE — Progress Notes (Addendum)
 For Anesthesia: PCP - Henriette Anes, DO  Cardiologist - Debera [name] MATSU, MD  Clearance: Daneen Damien BROCKS, NP : 12/20/23 Bowel Prep reminder:  Chest x-ray -  EKG - 11/13/23 Stress Test -  ECHO - 01/01/20 Cardiac Cath -  Pacemaker/ICD device last checked:01/07/24 Pacemaker orders received:Yes: EPIC. Device Rep notified:N/A  Spinal Cord Stimulator:N/A  Sleep Study - N/A CPAP -   Fasting Blood Sugar - N/A Checks Blood Sugar _____ times a day Date and result of last Hgb A1c-  Last dose of GLP1 agonist- N/A GLP1 instructions: Hold 7 days prior to schedule (Hold 24 hours-daily)   Last dose of SGLT-2 inhibitors-  SGLT-2 instructions: Hold 72 hours prior to surgery  Blood Thinner Instructions: Eliquis  will be on hold after: 01/11/24 Last Dose: Time last taken:  Aspirin  Instructions:N/A Last Dose: Time last taken:  Activity level: Can go up a flight of stairs and activities of daily living without stopping and without chest pain and/or shortness of breath   Able to exercise without chest pain and/or shortness of breath    Anesthesia review: Hx: MI,CAD,Afib,CKD,HTN,PVD,PAcemaker  Patient denies shortness of breath, fever, cough and chest pain at PAT appointment   Patient verbalized understanding of instructions that were reviewed over the telephone.
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
## 2024-01-09 NOTE — Anesthesia Preprocedure Evaluation (Addendum)
 Anesthesia Evaluation  Patient identified by MRN, date of birth, ID band  Reviewed: Allergy & Precautions, NPO status , Patient's Chart, lab work & pertinent test results, reviewed documented beta blocker date and time   History of Anesthesia Complications Negative for: history of anesthetic complications  Airway Mallampati: I  TM Distance: >3 FB Neck ROM: Full    Dental no notable dental hx. (+) Edentulous Upper, Edentulous Lower   Pulmonary former smoker   Pulmonary exam normal breath sounds clear to auscultation       Cardiovascular hypertension, Pt. on medications and Pt. on home beta blockers (-) angina + CAD, + Past MI, + CABG (2006) and + Peripheral Vascular Disease  Normal cardiovascular exam+ dysrhythmias (on eliquis  last 11/14) Atrial Fibrillation + pacemaker  Rhythm:Regular Rate:Normal  Echo 01/01/2020 1. Left ventricular ejection fraction, by estimation, is 60 to 65%. The  left ventricle has normal function. The left ventricle has no regional  wall motion abnormalities. There is mild left ventricular hypertrophy.  Left ventricular diastolic parameters  are consistent with Grade I diastolic dysfunction (impaired relaxation).   2. Right ventricular systolic function is normal. The right ventricular  size is normal.   3. Left atrial size was moderately dilated.   4. The mitral valve is normal in structure. Trivial mitral valve  regurgitation. No evidence of mitral stenosis.   5. The aortic valve is tricuspid. There is mild calcification of the  aortic valve. There is mild thickening of the aortic valve. Aortic valve  regurgitation is not visualized. No aortic stenosis is present.   6. The inferior vena cava is normal in size with greater than 50%  respiratory variability, suggesting right atrial pressure of 3 mmHg.       Neuro/Psych    GI/Hepatic   Endo/Other  neg diabetes    Renal/GU Renal InsufficiencyRenal  diseaseLab Results      Component                Value               Date                          K                        4.9                 01/08/2024                        CREATININE               1.33 (H)            01/08/2024                     Musculoskeletal  (+) Arthritis , Osteoarthritis,    Abdominal   Peds  Hematology Lab Results      Component                Value               Date                      WBC                      6.7  01/08/2024                HGB                      12.3 (L)            01/08/2024                HCT                      38.3 (L)            01/08/2024                MCV                      92.7                01/08/2024                PLT                      221                 01/08/2024              Anesthesia Other Findings   Reproductive/Obstetrics                              Anesthesia Physical Anesthesia Plan  ASA: 3  Anesthesia Plan: Regional and Spinal   Post-op Pain Management: Regional block*, Minimal or no pain anticipated and Tylenol  PO (pre-op )*   Induction:   PONV Risk Score and Plan: 2 and Treatment may vary due to age or medical condition, Propofol  infusion and Ondansetron   Airway Management Planned:   Additional Equipment: None  Intra-op Plan:   Post-operative Plan:   Informed Consent: I have reviewed the patients History and Physical, chart, labs and discussed the procedure including the risks, benefits and alternatives for the proposed anesthesia with the patient or authorized representative who has indicated his/her understanding and acceptance.     Dental advisory given  Plan Discussed with: CRNA and Surgeon  Anesthesia Plan Comments: (See PAT note 01/08/24  Spinal w L adductor canal block)         Anesthesia Quick Evaluation

## 2024-01-10 NOTE — Progress Notes (Signed)
 Remote PPM Transmission

## 2024-01-12 ENCOUNTER — Other Ambulatory Visit: Payer: Self-pay | Admitting: Cardiology

## 2024-01-13 ENCOUNTER — Ambulatory Visit: Payer: Self-pay | Admitting: Student in an Organized Health Care Education/Training Program

## 2024-01-13 ENCOUNTER — Ambulatory Visit: Payer: Self-pay | Admitting: Internal Medicine

## 2024-01-15 ENCOUNTER — Ambulatory Visit: Payer: Self-pay | Admitting: Orthopedic Surgery
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
## 2024-01-16 ENCOUNTER — Ambulatory Visit (HOSPITAL_COMMUNITY)
Admission: RE | Admit: 2024-01-16 | Discharge: 2024-01-18 | Disposition: A | Discharge status: Home / Self Care | Attending: Specialist | Admitting: Specialist

## 2024-01-16 ENCOUNTER — Other Ambulatory Visit: Payer: Self-pay

## 2024-01-16 ENCOUNTER — Ambulatory Visit (HOSPITAL_BASED_OUTPATIENT_CLINIC_OR_DEPARTMENT_OTHER): Payer: Self-pay | Admitting: Anesthesiology

## 2024-01-16 ENCOUNTER — Encounter (HOSPITAL_COMMUNITY)
Admission: RE | Disposition: A | Discharge status: Home / Self Care | Payer: Self-pay | Source: Home / Self Care | Attending: Specialist

## 2024-01-16 ENCOUNTER — Ambulatory Visit (HOSPITAL_COMMUNITY): Payer: Self-pay | Admitting: Physician Assistant

## 2024-01-16 ENCOUNTER — Ambulatory Visit (HOSPITAL_COMMUNITY)

## 2024-01-16 ENCOUNTER — Encounter (HOSPITAL_COMMUNITY): Payer: Self-pay | Admitting: Specialist

## 2024-01-16 DIAGNOSIS — Z79899 Other long term (current) drug therapy: Secondary | ICD-10-CM | POA: Diagnosis not present

## 2024-01-16 DIAGNOSIS — I25810 Atherosclerosis of coronary artery bypass graft(s) without angina pectoris: Secondary | ICD-10-CM | POA: Diagnosis not present

## 2024-01-16 DIAGNOSIS — Z7901 Long term (current) use of anticoagulants: Secondary | ICD-10-CM | POA: Insufficient documentation

## 2024-01-16 DIAGNOSIS — M1712 Unilateral primary osteoarthritis, left knee: Secondary | ICD-10-CM | POA: Diagnosis present

## 2024-01-16 DIAGNOSIS — I10 Essential (primary) hypertension: Secondary | ICD-10-CM | POA: Diagnosis not present

## 2024-01-16 DIAGNOSIS — Z950 Presence of cardiac pacemaker: Secondary | ICD-10-CM | POA: Insufficient documentation

## 2024-01-16 DIAGNOSIS — I442 Atrioventricular block, complete: Secondary | ICD-10-CM | POA: Insufficient documentation

## 2024-01-16 DIAGNOSIS — I252 Old myocardial infarction: Secondary | ICD-10-CM | POA: Diagnosis not present

## 2024-01-16 DIAGNOSIS — Z96651 Presence of right artificial knee joint: Secondary | ICD-10-CM | POA: Insufficient documentation

## 2024-01-16 DIAGNOSIS — I739 Peripheral vascular disease, unspecified: Secondary | ICD-10-CM | POA: Insufficient documentation

## 2024-01-16 DIAGNOSIS — I2510 Atherosclerotic heart disease of native coronary artery without angina pectoris: Secondary | ICD-10-CM | POA: Diagnosis not present

## 2024-01-16 DIAGNOSIS — Z87891 Personal history of nicotine dependence: Secondary | ICD-10-CM | POA: Diagnosis not present

## 2024-01-16 DIAGNOSIS — N1831 Chronic kidney disease, stage 3a: Secondary | ICD-10-CM | POA: Insufficient documentation

## 2024-01-16 DIAGNOSIS — Z96652 Presence of left artificial knee joint: Secondary | ICD-10-CM

## 2024-01-16 DIAGNOSIS — I4891 Unspecified atrial fibrillation: Secondary | ICD-10-CM | POA: Insufficient documentation

## 2024-01-16 DIAGNOSIS — I129 Hypertensive chronic kidney disease with stage 1 through stage 4 chronic kidney disease, or unspecified chronic kidney disease: Secondary | ICD-10-CM | POA: Insufficient documentation

## 2024-01-16 HISTORY — PX: TOTAL KNEE ARTHROPLASTY: SHX125

## 2024-01-16 LAB — PLATELET FUNCTION ASSAY: Collagen / Epinephrine: 158 s (ref 0–193)

## 2024-01-16 SURGERY — ARTHROPLASTY, KNEE, TOTAL
Anesthesia: Regional | Site: Knee | Laterality: Left

## 2024-01-16 MED ORDER — FENTANYL CITRATE (PF) 100 MCG/2ML IJ SOLN
INTRAMUSCULAR | Status: DC | PRN
  Administered 2024-01-16 (×2): 25 ug via INTRAVENOUS
  Administered 2024-01-16: 50 ug via INTRAVENOUS

## 2024-01-16 MED ORDER — LACTATED RINGERS IV SOLN: INTRAVENOUS | Status: DC

## 2024-01-16 MED ORDER — APIXABAN 2.5 MG PO TABS
2.5000 mg | ORAL_TABLET | Freq: Two times a day (BID) | ORAL | Status: DC
  Administered 2024-01-17 – 2024-01-18 (×3): 2.5 mg via ORAL
  Filled 2024-01-16 (×3): qty 1

## 2024-01-16 MED ORDER — FENTANYL CITRATE (PF) 100 MCG/2ML IJ SOLN
INTRAMUSCULAR | Status: AC
  Filled 2024-01-16: qty 2

## 2024-01-16 MED ORDER — ATORVASTATIN CALCIUM 40 MG PO TABS
40.0000 mg | ORAL_TABLET | Freq: Every evening | ORAL | Status: DC
  Administered 2024-01-16 – 2024-01-17 (×2): 40 mg via ORAL
  Filled 2024-01-16 (×2): qty 1

## 2024-01-16 MED ORDER — METHOCARBAMOL 500 MG PO TABS
500.0000 mg | ORAL_TABLET | Freq: Four times a day (QID) | ORAL | Status: DC | PRN
  Administered 2024-01-16 – 2024-01-18 (×5): 500 mg via ORAL
  Filled 2024-01-16 (×5): qty 1

## 2024-01-16 MED ORDER — HYDRALAZINE HCL 20 MG/ML IJ SOLN
10.0000 mg | INTRAMUSCULAR | Status: DC | PRN
  Administered 2024-01-17: 10 mg via INTRAVENOUS
  Filled 2024-01-16: qty 1

## 2024-01-16 MED ORDER — ORAL CARE MOUTH RINSE: 15.0000 mL | OROMUCOSAL | Status: DC | PRN

## 2024-01-16 MED ORDER — DIPHENHYDRAMINE HCL 12.5 MG/5ML PO ELIX: 12.5000 mg | ORAL_SOLUTION | ORAL | Status: DC | PRN

## 2024-01-16 MED ORDER — ALLOPURINOL 100 MG PO TABS
100.0000 mg | ORAL_TABLET | Freq: Every day | ORAL | Status: DC
  Administered 2024-01-17 – 2024-01-18 (×2): 100 mg via ORAL
  Filled 2024-01-16 (×2): qty 1

## 2024-01-16 MED ORDER — BUPIVACAINE-EPINEPHRINE 0.25% -1:200000 IJ SOLN
INTRAMUSCULAR | Status: DC | PRN
  Administered 2024-01-16: 30 mL

## 2024-01-16 MED ORDER — BISACODYL 5 MG PO TBEC: 5.0000 mg | DELAYED_RELEASE_TABLET | Freq: Every day | ORAL | Status: DC | PRN

## 2024-01-16 MED ORDER — ACETAMINOPHEN 500 MG PO TABS
1000.0000 mg | ORAL_TABLET | Freq: Four times a day (QID) | ORAL | Status: AC
  Administered 2024-01-16 – 2024-01-17 (×4): 1000 mg via ORAL
  Filled 2024-01-16 (×4): qty 2

## 2024-01-16 MED ORDER — HYDROMORPHONE HCL 1 MG/ML IJ SOLN: 0.2500 mg | INTRAMUSCULAR | Status: DC | PRN

## 2024-01-16 MED ORDER — LOSARTAN POTASSIUM 50 MG PO TABS
50.0000 mg | ORAL_TABLET | Freq: Every day | ORAL | Status: DC
  Administered 2024-01-16: 50 mg via ORAL
  Filled 2024-01-16: qty 1

## 2024-01-16 MED ORDER — ACETAMINOPHEN 10 MG/ML IV SOLN
1000.0000 mg | INTRAVENOUS | Status: AC
  Administered 2024-01-16: 1000 mg via INTRAVENOUS
  Filled 2024-01-16: qty 100

## 2024-01-16 MED ORDER — PROPOFOL 1000 MG/100ML IV EMUL
INTRAVENOUS | Status: AC
  Filled 2024-01-16: qty 100

## 2024-01-16 MED ORDER — FUROSEMIDE 40 MG PO TABS
40.0000 mg | ORAL_TABLET | Freq: Every day | ORAL | Status: DC
  Administered 2024-01-16 – 2024-01-18 (×3): 40 mg via ORAL
  Filled 2024-01-16 (×3): qty 1

## 2024-01-16 MED ORDER — OXYCODONE HCL 5 MG PO TABS
10.0000 mg | ORAL_TABLET | ORAL | Status: DC | PRN
  Administered 2024-01-16: 10 mg via ORAL
  Administered 2024-01-16 – 2024-01-17 (×5): 15 mg via ORAL
  Filled 2024-01-16 (×3): qty 3
  Filled 2024-01-16: qty 2
  Filled 2024-01-16 (×2): qty 3

## 2024-01-16 MED ORDER — PROPOFOL 500 MG/50ML IV EMUL
INTRAVENOUS | Status: DC | PRN
  Administered 2024-01-16: 75 ug/kg/min via INTRAVENOUS

## 2024-01-16 MED ORDER — SODIUM CHLORIDE (PF) 0.9 % IJ SOLN
INTRAMUSCULAR | Status: DC | PRN
  Administered 2024-01-16: 60 mL

## 2024-01-16 MED ORDER — ROPIVACAINE HCL 5 MG/ML IJ SOLN
INTRAMUSCULAR | Status: DC | PRN
  Administered 2024-01-16: 25 mL via PERINEURAL

## 2024-01-16 MED ORDER — PROPOFOL 500 MG/50ML IV EMUL
INTRAVENOUS | Status: AC
Start: 2024-01-16 — End: 2024-01-16
  Filled 2024-01-16: qty 50

## 2024-01-16 MED ORDER — METHOCARBAMOL 1000 MG/10ML IJ SOLN: 500.0000 mg | Freq: Four times a day (QID) | INTRAMUSCULAR | Status: DC | PRN

## 2024-01-16 MED ORDER — CEFAZOLIN SODIUM-DEXTROSE 2-4 GM/100ML-% IV SOLN
2.0000 g | INTRAVENOUS | Status: AC
  Administered 2024-01-16: 2 g via INTRAVENOUS
  Filled 2024-01-16: qty 100

## 2024-01-16 MED ORDER — ONDANSETRON HCL 4 MG PO TABS: 4.0000 mg | ORAL_TABLET | Freq: Four times a day (QID) | ORAL | Status: DC | PRN

## 2024-01-16 MED ORDER — SODIUM CHLORIDE (PF) 0.9 % IJ SOLN
INTRAMUSCULAR | Status: AC
  Filled 2024-01-16: qty 50

## 2024-01-16 MED ORDER — 0.9 % SODIUM CHLORIDE (POUR BTL) OPTIME
TOPICAL | Status: DC | PRN
Start: 2024-01-16 — End: 2024-01-16
  Administered 2024-01-16: 1000 mL

## 2024-01-16 MED ORDER — METOCLOPRAMIDE HCL 5 MG PO TABS: 5.0000 mg | ORAL_TABLET | Freq: Three times a day (TID) | ORAL | Status: DC | PRN

## 2024-01-16 MED ORDER — METOCLOPRAMIDE HCL 5 MG/ML IJ SOLN: 5.0000 mg | Freq: Three times a day (TID) | INTRAMUSCULAR | Status: DC | PRN

## 2024-01-16 MED ORDER — DOCUSATE SODIUM 100 MG PO CAPS
100.0000 mg | ORAL_CAPSULE | Freq: Two times a day (BID) | ORAL | Status: DC
  Administered 2024-01-16 – 2024-01-18 (×4): 100 mg via ORAL
  Filled 2024-01-16 (×4): qty 1

## 2024-01-16 MED ORDER — NITROGLYCERIN 0.4 MG SL SUBL: 0.4000 mg | SUBLINGUAL_TABLET | SUBLINGUAL | Status: DC | PRN

## 2024-01-16 MED ORDER — CLONIDINE HCL (ANALGESIA) 100 MCG/ML EP SOLN
EPIDURAL | Status: DC | PRN
  Administered 2024-01-16: 100 ug

## 2024-01-16 MED ORDER — SODIUM CHLORIDE 0.9 % IR SOLN
Status: DC | PRN
  Administered 2024-01-16: 3000 mL
  Administered 2024-01-16: 250 mL

## 2024-01-16 MED ORDER — BUPIVACAINE IN DEXTROSE 0.75-8.25 % IT SOLN
INTRATHECAL | Status: DC | PRN
Start: 2024-01-16 — End: 2024-01-16
  Administered 2024-01-16: 12 mg via INTRATHECAL

## 2024-01-16 MED ORDER — ONDANSETRON HCL 4 MG/2ML IJ SOLN
INTRAMUSCULAR | Status: AC
  Filled 2024-01-16: qty 2

## 2024-01-16 MED ORDER — OXYCODONE HCL 5 MG PO TABS: 5.0000 mg | ORAL_TABLET | Freq: Once | ORAL | Status: DC | PRN

## 2024-01-16 MED ORDER — CHLORHEXIDINE GLUCONATE 0.12 % MT SOLN
15.0000 mL | Freq: Once | OROMUCOSAL | Status: AC
  Administered 2024-01-16: 15 mL via OROMUCOSAL

## 2024-01-16 MED ORDER — ACETAMINOPHEN 10 MG/ML IV SOLN: 1000.0000 mg | Freq: Once | INTRAVENOUS | Status: DC | PRN

## 2024-01-16 MED ORDER — MAGNESIUM CITRATE PO SOLN: 1.0000 | Freq: Once | ORAL | Status: DC | PRN

## 2024-01-16 MED ORDER — METOPROLOL TARTRATE 25 MG PO TABS
25.0000 mg | ORAL_TABLET | Freq: Two times a day (BID) | ORAL | Status: DC
  Administered 2024-01-16 – 2024-01-18 (×4): 25 mg via ORAL
  Filled 2024-01-16 (×4): qty 1

## 2024-01-16 MED ORDER — COQ-10 100 MG PO CAPS: 100.0000 mg | ORAL_CAPSULE | Freq: Every day | ORAL | Status: DC

## 2024-01-16 MED ORDER — KCL IN DEXTROSE-NACL 20-5-0.9 MEQ/L-%-% IV SOLN
INTRAVENOUS | Status: AC
  Filled 2024-01-16 (×2): qty 1000

## 2024-01-16 MED ORDER — ISOSORBIDE MONONITRATE ER 30 MG PO TB24
30.0000 mg | ORAL_TABLET | Freq: Every day | ORAL | Status: DC
  Administered 2024-01-17: 30 mg via ORAL
  Filled 2024-01-16 (×2): qty 1

## 2024-01-16 MED ORDER — MENTHOL 3 MG MT LOZG: 1.0000 | LOZENGE | OROMUCOSAL | Status: DC | PRN

## 2024-01-16 MED ORDER — CEFAZOLIN SODIUM-DEXTROSE 2-4 GM/100ML-% IV SOLN
2.0000 g | Freq: Four times a day (QID) | INTRAVENOUS | Status: AC
  Administered 2024-01-16 (×2): 2 g via INTRAVENOUS
  Filled 2024-01-16 (×2): qty 100

## 2024-01-16 MED ORDER — ONDANSETRON HCL 4 MG/2ML IJ SOLN
INTRAMUSCULAR | Status: DC | PRN
  Administered 2024-01-16: 4 mg via INTRAVENOUS

## 2024-01-16 MED ORDER — PHENOL 1.4 % MT LIQD: 1.0000 | OROMUCOSAL | Status: DC | PRN

## 2024-01-16 MED ORDER — BUPIVACAINE-EPINEPHRINE (PF) 0.25% -1:200000 IJ SOLN
INTRAMUSCULAR | Status: AC
  Filled 2024-01-16: qty 30

## 2024-01-16 MED ORDER — STERILE WATER FOR IRRIGATION IR SOLN
Status: DC | PRN
  Administered 2024-01-16: 2000 mL

## 2024-01-16 MED ORDER — OXYCODONE HCL 5 MG/5ML PO SOLN: 5.0000 mg | Freq: Once | ORAL | Status: DC | PRN

## 2024-01-16 MED ORDER — TRANEXAMIC ACID-NACL 1000-0.7 MG/100ML-% IV SOLN
1000.0000 mg | INTRAVENOUS | Status: AC
  Administered 2024-01-16: 1000 mg via INTRAVENOUS
  Filled 2024-01-16: qty 100

## 2024-01-16 MED ORDER — POLYETHYLENE GLYCOL 3350 17 G PO PACK
17.0000 g | PACK | Freq: Every day | ORAL | Status: DC
  Administered 2024-01-17 – 2024-01-18 (×2): 17 g via ORAL
  Filled 2024-01-16 (×2): qty 1

## 2024-01-16 MED ORDER — HYDROMORPHONE HCL 1 MG/ML IJ SOLN
0.5000 mg | INTRAMUSCULAR | Status: DC | PRN
  Administered 2024-01-16: 0.5 mg via INTRAVENOUS
  Administered 2024-01-16: 1 mg via INTRAVENOUS
  Administered 2024-01-16: 0.5 mg via INTRAVENOUS
  Administered 2024-01-17: 1 mg via INTRAVENOUS
  Filled 2024-01-16 (×4): qty 1

## 2024-01-16 MED ORDER — OXYCODONE HCL 5 MG PO TABS
5.0000 mg | ORAL_TABLET | ORAL | Status: DC | PRN
  Administered 2024-01-16 – 2024-01-18 (×3): 10 mg via ORAL
  Filled 2024-01-16 (×3): qty 2

## 2024-01-16 MED ORDER — VITAMIN B-12 1000 MCG PO TABS
500.0000 ug | ORAL_TABLET | Freq: Every day | ORAL | Status: DC
  Administered 2024-01-17 – 2024-01-18 (×2): 500 ug via ORAL
  Filled 2024-01-16 (×2): qty 1

## 2024-01-16 MED ORDER — ONDANSETRON HCL 4 MG/2ML IJ SOLN
4.0000 mg | Freq: Four times a day (QID) | INTRAMUSCULAR | Status: DC | PRN
  Administered 2024-01-17 (×2): 4 mg via INTRAVENOUS
  Filled 2024-01-16 (×2): qty 2

## 2024-01-16 MED ORDER — ONDANSETRON HCL 4 MG/2ML IJ SOLN: 4.0000 mg | Freq: Once | INTRAMUSCULAR | Status: DC | PRN

## 2024-01-16 MED ORDER — EMPAGLIFLOZIN 10 MG PO TABS
10.0000 mg | ORAL_TABLET | Freq: Every day | ORAL | Status: DC
  Administered 2024-01-16 – 2024-01-17 (×2): 10 mg via ORAL
  Filled 2024-01-16 (×3): qty 1

## 2024-01-16 MED ORDER — ALUM & MAG HYDROXIDE-SIMETH 200-200-20 MG/5ML PO SUSP: 30.0000 mL | ORAL | Status: DC | PRN

## 2024-01-16 MED ORDER — ORAL CARE MOUTH RINSE: 15.0000 mL | Freq: Once | OROMUCOSAL | Status: AC

## 2024-01-16 MED ORDER — ADULT MULTIVITAMIN W/MINERALS CH
1.0000 | ORAL_TABLET | Freq: Every day | ORAL | Status: DC
  Administered 2024-01-17 – 2024-01-18 (×2): 1 via ORAL
  Filled 2024-01-16 (×2): qty 1

## 2024-01-16 MED ORDER — BUPIVACAINE LIPOSOME 1.3 % IJ SUSP
INTRAMUSCULAR | Status: AC
  Filled 2024-01-16: qty 20

## 2024-01-16 MED ORDER — PHENYLEPHRINE HCL-NACL 20-0.9 MG/250ML-% IV SOLN
INTRAVENOUS | Status: DC | PRN
  Administered 2024-01-16: 15 ug/min via INTRAVENOUS

## 2024-01-16 MED ORDER — RISAQUAD PO CAPS
1.0000 | ORAL_CAPSULE | Freq: Every day | ORAL | Status: DC
  Administered 2024-01-17 – 2024-01-18 (×2): 1 via ORAL
  Filled 2024-01-16 (×2): qty 1

## 2024-01-16 SURGICAL SUPPLY — 55 items
ATTUNE MED DOME PAT 38 KNEE (Knees) IMPLANT
ATTUNE PS FEM LT SZ 8 CEM KNEE (Femur) IMPLANT
ATTUNE PSRP INSR SZ8 8 KNEE (Insert) IMPLANT
BAG COUNTER SPONGE SURGICOUNT (BAG) ×1 IMPLANT
BAG ZIPLOCK 12X15 (MISCELLANEOUS) IMPLANT
BASE TIBIAL ROT PLAT SZ 8 KNEE (Knees) IMPLANT
BLADE SAW SGTL 11.0X1.19X90.0M (BLADE) ×1 IMPLANT
BLADE SAW SGTL 13.0X1.19X90.0M (BLADE) ×1 IMPLANT
BLADE SURG SZ10 CARB STEEL (BLADE) ×2 IMPLANT
BNDG ELASTIC 4INX 5YD STR LF (GAUZE/BANDAGES/DRESSINGS) ×1 IMPLANT
BNDG ELASTIC 6INX 5YD STR LF (GAUZE/BANDAGES/DRESSINGS) ×1 IMPLANT
BOWL SMART MIX CTS (DISPOSABLE) ×1 IMPLANT
CEMENT HV SMART SET (Cement) ×2 IMPLANT
COVER SURGICAL LIGHT HANDLE (MISCELLANEOUS) ×1 IMPLANT
CUFF TRNQT CYL 34X4.125X (TOURNIQUET CUFF) ×1 IMPLANT
DRAPE INCISE IOBAN 66X45 STRL (DRAPES) IMPLANT
DRAPE SHEET LG 3/4 BI-LAMINATE (DRAPES) ×1 IMPLANT
DRAPE SURG ORHT 6 SPLT 77X108 (DRAPES) ×2 IMPLANT
DRAPE TOP 10253 STERILE (DRAPES) ×1 IMPLANT
DRAPE U-SHAPE 47X51 STRL (DRAPES) ×1 IMPLANT
DRSG AQUACEL AG ADV 3.5X10 (GAUZE/BANDAGES/DRESSINGS) ×1 IMPLANT
DURAPREP 26ML APPLICATOR (WOUND CARE) ×1 IMPLANT
ELECT BLADE TIP CTD 4 INCH (ELECTRODE) ×1 IMPLANT
ELECT PENCIL ROCKER SW 15FT (MISCELLANEOUS) ×1 IMPLANT
ELECT REM PT RETURN 15FT ADLT (MISCELLANEOUS) ×1 IMPLANT
GLOVE BIOGEL PI IND STRL 7.0 (GLOVE) ×1 IMPLANT
GLOVE BIOGEL PI IND STRL 8 (GLOVE) ×1 IMPLANT
GLOVE SURG SS PI 7.0 STRL IVOR (GLOVE) ×1 IMPLANT
GLOVE SURG SS PI 8.0 STRL IVOR (GLOVE) ×2 IMPLANT
GOWN STRL REUS W/ TWL XL LVL3 (GOWN DISPOSABLE) ×2 IMPLANT
HEMOSTAT SPONGE AVITENE ULTRA (HEMOSTASIS) IMPLANT
HOLDER FOLEY CATH W/STRAP (MISCELLANEOUS) ×1 IMPLANT
IMMOBILIZER KNEE 20 THIGH 36 (SOFTGOODS) ×1 IMPLANT
KIT TURNOVER KIT A (KITS) ×1 IMPLANT
MANIFOLD NEPTUNE II (INSTRUMENTS) ×1 IMPLANT
NS IRRIG 1000ML POUR BTL (IV SOLUTION) IMPLANT
PACK TOTAL KNEE CUSTOM (KITS) ×1 IMPLANT
PIN STEINMAN FIXATION KNEE (PIN) IMPLANT
PROTECTOR NERVE ULNAR (MISCELLANEOUS) ×1 IMPLANT
SEALER BIPOLAR AQUA 6.0 (INSTRUMENTS) ×1 IMPLANT
SET HNDPC FAN SPRY TIP SCT (DISPOSABLE) ×1 IMPLANT
SOLUTION PRONTOSAN WOUND 350ML (IRRIGATION / IRRIGATOR) ×1 IMPLANT
SPIKE FLUID TRANSFER (MISCELLANEOUS) ×1 IMPLANT
STAPLER VISISTAT (STAPLE) IMPLANT
STRIP CLOSURE SKIN 1/2X4 (GAUZE/BANDAGES/DRESSINGS) IMPLANT
SUT BONE WAX W31G (SUTURE) IMPLANT
SUT MNCRL AB 4-0 PS2 18 (SUTURE) IMPLANT
SUT STRATAFIX PDS+ 0 24IN (SUTURE) ×1 IMPLANT
SUT VIC AB 1 CT1 27XBRD ANTBC (SUTURE) ×3 IMPLANT
SUT VIC AB 2-0 CT1 TAPERPNT 27 (SUTURE) ×3 IMPLANT
TRAY FOLEY MTR SLVR 16FR STAT (SET/KITS/TRAYS/PACK) ×1 IMPLANT
TUBE SUCTION HIGH CAP CLEAR NV (SUCTIONS) ×1 IMPLANT
WATER STERILE IRR 1000ML POUR (IV SOLUTION) ×2 IMPLANT
WIPE CHG 2% PREP (PERSONAL CARE ITEMS) ×1 IMPLANT
WRAP KNEE MAXI GEL POST OP (GAUZE/BANDAGES/DRESSINGS) ×1 IMPLANT

## 2024-01-16 NOTE — Brief Op Note (Addendum)
 01/16/2024  8:10 AM  PATIENT:  William Hatfield  78 y.o. male  PRE-OPERATIVE DIAGNOSIS:  degenerative joint disease left k nee  POST-OPERATIVE DIAGNOSIS:  *same The aquamantis was utilized for this case to help facilitate better hemostasis as patient was felt to be at increased risk of bleeding because of recent anticoagulation use.   PROCEDURE:  Procedure(s): ARTHROPLASTY, KNEE, TOTAL (Left)  SURGEON:  Surgeons and Role:    * Duwayne Purchase, MD - Primary  PHYSICIAN ASSISTANT:   ASSISTANTS: Bissell   ANESTHESIA:   spinal  EBL:  min   BLOOD ADMINISTERED:none  DRAINS: none   LOCAL MEDICATIONS USED:  MARCAINE     SPECIMEN:  No Specimen  DISPOSITION OF SPECIMEN:  N/A  COUNTS:  YES  TOURNIQUET: 65 min  DICTATION: .Other Dictation: Dictation Number [id number]  PLAN OF CARE: Admit for overnight observation  PATIENT DISPOSITION:  PACU - hemodynamically stable.   Delay start of Pharmacological VTE agent (>24hrs) due to surgical blood loss or risk of bleeding: no

## 2024-01-16 NOTE — Plan of Care (Signed)
  Problem: Health Behavior/Discharge Planning: Goal: Ability to manage health-related needs will improve Outcome: Progressing   Problem: Clinical Measurements: Goal: Ability to maintain clinical measurements within normal limits will improve Outcome: Progressing Goal: Will remain free from infection Outcome: Progressing Goal: Diagnostic test results will improve Outcome: Progressing Goal: Respiratory complications will improve Outcome: Progressing Goal: Cardiovascular complication will be avoided Outcome: Progressing   Problem: Activity: Goal: Risk for activity intolerance will decrease Outcome: Progressing   Problem: Nutrition: Goal: Adequate nutrition will be maintained Outcome: Progressing   Problem: Coping: Goal: Level of anxiety will decrease Outcome: Progressing   Problem: Elimination: Goal: Will not experience complications related to bowel motility Outcome: Progressing Goal: Will not experience complications related to urinary retention Outcome: Progressing   Problem: Pain Managment: Goal: General experience of comfort will improve and/or be controlled Outcome: Progressing   Problem: Safety: Goal: Ability to remain free from injury will improve Outcome: Progressing   Problem: Skin Integrity: Goal: Risk for impaired skin integrity will decrease Outcome: Progressing   Problem: Education: Goal: Knowledge of the prescribed therapeutic regimen will improve Outcome: Progressing Goal: Individualized Educational Video(s) Outcome: Progressing   Problem: Activity: Goal: Ability to avoid complications of mobility impairment will improve Outcome: Progressing Goal: Range of joint motion will improve Outcome: Progressing   Problem: Clinical Measurements: Goal: Postoperative complications will be avoided or minimized Outcome: Progressing   Problem: Pain Management: Goal: Pain level will decrease with appropriate interventions Outcome: Progressing   Problem:  Skin Integrity: Goal: Will show signs of wound healing Outcome: Progressing

## 2024-01-16 NOTE — Transfer of Care (Signed)
 Immediate Anesthesia Transfer of Care Note  Patient: William Hatfield  Procedure(s) Performed: ARTHROPLASTY, KNEE, TOTAL (Left: Knee)  Patient Location: PACU  Anesthesia Type:Regional and Spinal  Level of Consciousness: awake, alert , oriented, and patient cooperative  Airway & Oxygen Therapy: Patient Spontanous Breathing and Patient connected to face mask oxygen  Post-op Assessment: Report given to RN and Post -op Vital signs reviewed and stable  Post vital signs: Reviewed and stable  Last Vitals:  Vitals Value Taken Time  BP 100/83 01/16/24 10:50  Temp 36.4 C 01/16/24 10:50  Pulse 78 01/16/24 10:51  Resp 17 01/16/24 10:51  SpO2 100 % 01/16/24 10:51  Vitals shown include unfiled device data.  Last Pain:  Vitals:   01/16/24 0636  TempSrc:   PainSc: 0-No pain         Complications: No notable events documented.

## 2024-01-16 NOTE — Interval H&P Note (Signed)
 History and Physical Interval Note:  01/16/2024 8:05 AM  William Hatfield  has presented today for surgery, with the diagnosis of degenerative joint disease left k nee.  The various methods of treatment have been discussed with the patient and family. After consideration of risks, benefits and other options for treatment, the patient has consented to  Procedure(s): ARTHROPLASTY, KNEE, TOTAL (Left) as a surgical intervention.  The patient's history has been reviewed, patient examined, no change in status, stable for surgery.  I have reviewed the patient's chart and labs.  Questions were answered to the patient's satisfaction.     Reyes [name] Billing

## 2024-01-16 NOTE — Progress Notes (Signed)
 Pt complains of 10/10 pain to left knee uncontrolled by current pain medication regimen. Notified on call provider for Dr. Duwayne. Received call back from Avery PA-C. Okay to give IV Dilaudid  1mg  now, otherwise continue with current pain medication schedule and ice to that knee. Also made aware of pts elevated BP & HR, no complaints of chest pain or SOB by pt. No new orders, just continue to monitor.

## 2024-01-16 NOTE — Evaluation (Signed)
 Physical Therapy Evaluation Patient Details Name: William Hatfield MRN: [id number] DOB: 07/14/1945 Today's Date: 01/16/2024  History of Present Illness  78 y.o. male admitted 01/16/24 for L TKA. PMH: afib, CAD, MI, PVD, back surgery x2, pacemaker.  Clinical Impression  Pt is s/p TKA resulting in the deficits listed below (see PT Problem List). Pt is mobilizing well, he ambulated 95' with RW, no loss of balance. Initiated TKA HEP. Good progress expected.  Pt will benefit from acute skilled PT to increase their independence and safety with mobility to allow discharge.          If plan is discharge home, recommend the following: A little help with walking and/or transfers;A little help with bathing/dressing/bathroom;Assistance with cooking/housework;Assist for transportation;Help with stairs or ramp for entrance   Can travel by private vehicle        Equipment Recommendations Rolling walker (2 wheels)  Recommendations for Other Services       Functional Status Assessment Patient has had a recent decline in their functional status and demonstrates the ability to make significant improvements in function in a reasonable and predictable amount of time.     Precautions / Restrictions Precautions Precautions: Knee;Fall Recall of Precautions/Restrictions: Intact Precaution/Restrictions Comments: reviewed no pillow under knee Restrictions Weight Bearing Restrictions Per Provider Order: No Other Position/Activity Restrictions: WBAT      Mobility  Bed Mobility Overal bed mobility: Needs Assistance Bed Mobility: Supine to Sit           General bed mobility comments: gait belt used as leg lifter, min A to raise trunk    Transfers Overall transfer level: Needs assistance Equipment used: Rolling walker (2 wheels) Transfers: Sit to/from Stand Sit to Stand: Min assist, From elevated surface           General transfer comment: VCs hand placement     Ambulation/Gait Ambulation/Gait assistance: Contact guard assist Gait Distance (Feet): 80 Feet Assistive device: Rolling walker (2 wheels) Gait Pattern/deviations: Step-to pattern, Decreased step length - right, Decreased step length - left Gait velocity: decr     General Gait Details: VCs sequencing, no loss of balance, no buckling  Stairs            Wheelchair Mobility     Tilt Bed    Modified Rankin (Stroke Patients Only)       Balance Overall balance assessment: Modified Independent                                           Pertinent Vitals/Pain Pain Assessment Pain Assessment: 0-10 Pain Score: 7  Pain Location: L knee Pain Descriptors / Indicators: Sore Pain Intervention(s): Limited activity within patient's tolerance, Monitored during session, Premedicated before session, Ice applied, Repositioned    Home Living Family/patient expects to be discharged to:: Private residence Living Arrangements: Alone Available Help at Discharge: Family;Available 24 hours/day   Home Access: Level entry       Home Layout: One level Home Equipment: Rollator (4 wheels);Cane - single point Additional Comments: 2 daughters to assist    Prior Function Prior Level of Function : Independent/Modified Independent;Driving             Mobility Comments: walked without AD, no falls in 6 months       Extremity/Trunk Assessment   Upper Extremity Assessment Upper Extremity Assessment: Overall WFL for tasks assessed    Lower Extremity Assessment  Lower Extremity Assessment: LLE deficits/detail LLE Deficits / Details: SLR 3/5 at least, knee AAROM ~10-55* limited by pain LLE Sensation: WNL LLE Coordination: WNL    Cervical / Trunk Assessment Cervical / Trunk Assessment: Normal  Communication   Communication Communication: No apparent difficulties    Cognition Arousal: Alert Behavior During Therapy: WFL for tasks assessed/performed   PT -  Cognitive impairments: No apparent impairments                         Following commands: Intact       Cueing Cueing Techniques: Verbal cues     General Comments      Exercises Total Joint Exercises Ankle Circles/Pumps: AROM, Both, 15 reps, Supine Heel Slides: AAROM, Left, 10 reps, Supine Long Arc Quad: AROM, Left, 5 reps, Seated Goniometric ROM: ~10-55* AAROM L knee   Assessment/Plan    PT Assessment Patient needs continued PT services  PT Problem List Decreased range of motion;Decreased strength;Decreased mobility;Pain       PT Treatment Interventions Gait training;Therapeutic exercise;DME instruction;Therapeutic activities;Patient/family education    PT Goals (Current goals can be found in the Care Plan section)  Acute Rehab PT Goals 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 Formulation: With patient Time For Goal Achievement: 01/23/24 Potential to Achieve Goals: Good    Frequency 7X/week     Co-evaluation               AM-PAC PT 6 Clicks Mobility  Outcome Measure Help needed turning from your back to your side while in a flat bed without using bedrails?: None Help needed moving from lying on your back to sitting on the side of a flat bed without using bedrails?: A Little Help needed moving to and from a bed to a chair (including a wheelchair)?: A Little Help needed standing up from a chair using your arms (e.g., wheelchair or bedside chair)?: A Little Help needed to walk in hospital room?: A Little Help needed climbing 3-5 steps with a railing? : A Little 6 Click Score: 19    End of Session Equipment Utilized During Treatment: Gait belt Activity Tolerance: Patient tolerated treatment well Patient left: in chair;with chair alarm set;with call bell/phone within reach Nurse Communication: Mobility status PT Visit Diagnosis: Pain;Difficulty in walking, not elsewhere classified (R26.2);Muscle weakness (generalized)  (M62.81) Pain - Right/Left: Left Pain - part of body: Knee    Time: 1419-1440 PT Time Calculation (min) (ACUTE ONLY): 21 min   Charges:   PT Evaluation $PT Eval Moderate Complexity: 1 Mod   PT General Charges $$ ACUTE PT VISIT: 1 Visit         Sylvan Delon Copp PT 01/16/2024  Acute Rehabilitation Services  Office [phone]

## 2024-01-16 NOTE — Op Note (Signed)
 William Hatfield, William Hatfield MEDICAL RECORD NO: [id number] ACCOUNT NO: [id number] DATE OF BIRTH: 1945/04/03 FACILITY: [facility] LOCATION: [facility] PHYSICIAN: Reyes KYM Billing, MD  Operative Report   DATE OF PROCEDURE: 01/16/2024  PREOPERATIVE DIAGNOSES:  End-stage osteoarthrosis, varus deformity of the left knee.  POSTOPERATIVE DIAGNOSES:  End-stage osteoarthrosis, varus deformity of the left knee.  PROCEDURE PERFORMED:  Left total knee arthroplasty utilizing the Attune DePuy rotating platform, 8 femur, 8 tibia, 8 mm insert, and 38 patella.  ANESTHESIA:  Spinal.  ASSISTANT:  Jacquelyn Bissell, PA.  HISTORY:  This is a 78 year old with end-stage osteoarthrosis and varus deformity of the left knee indicated for replacement of the degenerated joint, bone on bone. Risks and benefits discussed including bleeding, infection, damage to neurovascular  structures, no change in symptoms, worsening symptoms, DVT, PE, anesthetic complications, etc.  DESCRIPTION OF PROCEDURE:  The patient was placed in the supine position. After the induction of adequate spinal anesthesia, 2 g of Kefzol  was administered, and the left lower extremity was prepped, draped, and exsanguinated in the usual sterile fashion.  The thigh tourniquet was inflated to 225 mmHg. A midline incision was then made over the knee. Full-thickness flaps were developed. A medial parapatellar arthrotomy was then performed. Soft tissue was elevated medially, preserved in the MCL. The patella  was gently everted and the knee was flexed. Tricompartmental osteoarthrosis, bone-on-bone medially was noted. Debridement of the fat pad was performed. Remnants of the medial and lateral meniscus were excised. I placed a notch above the femoral notch.  The starting port for the femoral drill was drilled in line with the femur, entering the canal without difficulty. This was then confirmed with a T-handle. Prior to that, it was irrigated. The intramedullary guide was  placed at 5-degree left. 9 mm off  the distal femur was selected, pinned, and the cut was performed. The femur was sized off the anterior cortex to an 8 with 3 degrees of external rotation pinned. It was in line with the transepicondylar axis. We placed our block and performed anterior,  posterior, and chamfer cuts. Soft tissues were protected posteriorly at all times. The tibia was then subluxed below side was medially, 3 mm off that with an external alignment guide parallel to the shaft, bisecting the tibiotalar joint with a 3-degree  slope. This was pinned. I performed the cut. This was 10 mm laterally and 3 mm medially. I then used an extension block, which was satisfactory at 8 mm and perhaps slightly tight at 10 mm. I reflexed the knee, subluxed the tibia, sized it to an 8 to the  medial third of the tibial tubercle, pinned it, harvested the bone centrally and impacted it into the distal femur, reamed centrally, and used a punch guide.I performed the box cut after applying the cutting block bisecting the condyles.I then placed a trial femur, which sat flush, drilled the lug holes, placed first a 10 mm and  then an 8 mm insert and had full flexion and full extension and good stability to varus and valgus stress at 0 and 30 degrees. The patella was everted, measured at 25 mm, and planed to 15 mm utilizing the patellar jig. Then a paddle parallel to the joint  surface for drilling our peg holes. These were drilled. I placed a trial 38, which sat flush. It was reduced. There was slight lateral tracking. A lateral release was therefore performed, then excellent tracking was achieved. All instrumentation was  removed. I checked posteriorly and cauterized the geniculates. The  popliteus and capsule were intact. I used Exparel through the posteromedial capsule, aspirating first without a break in the vacuum and injecting 20 mL. I anesthetized in the medial and  lateral gutters, the periosteum of the femur,  quadriceps tendon, and proximal tibia. Then, I used pulsatile lavage to thoroughly clean all surfaces. The knee was flexed and all the surfaces were thoroughly dried. Cement was mixed on the back table under  vacuum and centrifuge. It was then placed in the proximal tibia, digitally pressurizing it. Cement was placed on the tibial component and impacted into place. Cement was placed on the femoral component and the femur and impacted into place. It fit flush.  I placed an 8 mm insert, reduced it, and held an axial load in full extension throughout the curing of the cement. Redundant cement was removed. I cemented and clamped the patella as well. Marcaine with epinephrine followed by Prontosan was placed in  the wound during the curing of the cement, which occurred at 65 minutes. The tourniquet was deflated. Any bleeding was cauterized with the Aquamantys. I then removed the insert and meticulously removed all redundant cement. I irrigated with pulsatile  lavage followed by Prontosan, placed an 8 mm permanent insert, and reduced it. There was full flexion and full extension, good stability with varus and valgus stressing at 0 and 30 degrees, and a negative anterior drawer. In mid flexion, I reapproximated  the medial parapatellar arthrotomy with 1-0 Vicryl interrupted figure-of-eight sutures oversewn with a running Stratafix. There was excellent patellofemoral tracking following that and excellent stability. I irrigated the subcutaneous tissue with  Prontosan and closed the subcutaneous tissue with 2-0 and the skin with staples. The wound was dressed sterilely, placed in an immobilizer, and transported to the recovery room in satisfactory condition.   The patient tolerated the procedure well with no complications.  The assistant, Drue Randy, PA, was used throughout the case for patient positioning, gentle traction, and closure.  BLOOD LOSS:  50 mL.   PUS D: 01/16/2024 10:39:26 am T: 01/16/2024  10:55:00 am  JOB: [id number]/ [id number]

## 2024-01-16 NOTE — Interval H&P Note (Signed)
 History and Physical Interval Note:  01/16/2024 8:09 AM  William Hatfield  has presented today for surgery, with the diagnosis of degenerative joint disease left k nee.  The various methods of treatment have been discussed with the patient and family. After consideration of risks, benefits and other options for treatment, the patient has consented to  Procedure(s): ARTHROPLASTY, KNEE, TOTAL (Left) as a surgical intervention.  The patient's history has been reviewed, patient examined, no change in status, stable for surgery.  I have reviewed the patient's chart and labs.  Questions were answered to the patient's satisfaction.     Reyes [name] Billing

## 2024-01-16 NOTE — Anesthesia Postprocedure Evaluation (Signed)
 Anesthesia Post Note  Patient: William Hatfield  Procedure(s) Performed: ARTHROPLASTY, KNEE, TOTAL (Left: Knee)     Patient location during evaluation: Nursing Unit Anesthesia Type: Regional and Spinal Level of consciousness: oriented and awake and alert Pain management: pain level controlled Vital Signs Assessment: post-procedure vital signs reviewed and stable Respiratory status: spontaneous breathing and respiratory function stable Cardiovascular status: blood pressure returned to baseline and stable Postop Assessment: no headache, no backache, no apparent nausea or vomiting and patient able to bend at knees Anesthetic complications: no   No notable events documented.  Last Vitals:  Vitals:   01/16/24 1217 01/16/24 1421  BP: 135/76 131/82  Pulse: 97 74  Resp: 19 16  Temp: 36.6 C (!) 36.3 C  SpO2: 98% 100%    Last Pain:  Vitals:   01/16/24 1421  TempSrc: Oral  PainSc:                  William Hatfield

## 2024-01-16 NOTE — Interval H&P Note (Signed)
 History and Physical Interval Note:  01/16/2024 8:08 AM  William Hatfield  has presented today for surgery, with the diagnosis of degenerative joint disease left k nee.  The various methods of treatment have been discussed with the patient and family. After consideration of risks, benefits and other options for treatment, the patient has consented to  Procedure(s): ARTHROPLASTY, KNEE, TOTAL (Left) as a surgical intervention.  The patient's history has been reviewed, patient examined, no change in status, stable for surgery.  I have reviewed the patient's chart and labs.  Questions were answered to the patient's satisfaction.     Reyes [name] Billing

## 2024-01-16 NOTE — Anesthesia Procedure Notes (Signed)
 Anesthesia Regional Block: Adductor canal block   Pre-Anesthetic Checklist: , timeout performed,  Correct Patient, Correct Site, Correct Laterality,  Correct Procedure, Correct Position, site marked,  Risks and benefits discussed,  Surgical consent,  Pre-op  evaluation,  At surgeon's request and post-op pain management  Laterality: Lower and Left  Prep: chloraprep       Needles:  Injection technique: Single-shot  Needle Type: Echogenic Needle     Needle Length: 9cm  Needle Gauge: 22     Additional Needles:   Procedures:,,,, ultrasound used (permanent image in chart),,    Narrative:  Start time: 01/16/2024 8:01 AM End time: 01/16/2024 8:06 AM Injection made incrementally with aspirations every 5 mL.  Performed by: Personally  Anesthesiologist: Jefm Garnette LABOR, MD  Additional Notes: Block assessed prior to surgery. Pt tolerated procedure well.

## 2024-01-16 NOTE — Discharge Instructions (Addendum)

## 2024-01-16 NOTE — Anesthesia Procedure Notes (Addendum)
 Spinal  Patient location during procedure: OR Start time: 01/16/2024 8:30 AM End time: 01/16/2024 8:35 AM Reason for block: surgical anesthesia Staffing Performed: anesthesiologist  Anesthesiologist: Jefm Garnette LABOR, MD Performed by: Jefm Garnette LABOR, MD Authorized by: Jefm Garnette LABOR, MD   Preanesthetic Checklist Completed: patient identified, IV checked, risks and benefits discussed, surgical consent, monitors and equipment checked, pre-op  evaluation and timeout performed Spinal Block Patient position: sitting Prep: DuraPrep and site prepped and draped Patient monitoring: heart rate, cardiac monitor, continuous pulse ox and blood pressure Approach: midline Location: L3-4 Injection technique: single-shot Needle Needle type: Pencan  Needle gauge: 24 G Needle length: 10 cm Needle insertion depth: 7 cm Assessment Sensory level: T4 Events: CSF return Additional Notes  2 Attempt (s). Pt tolerated procedure well.

## 2024-01-17 ENCOUNTER — Encounter (HOSPITAL_COMMUNITY): Payer: Self-pay | Admitting: Specialist

## 2024-01-17 DIAGNOSIS — M1712 Unilateral primary osteoarthritis, left knee: Secondary | ICD-10-CM | POA: Diagnosis not present

## 2024-01-17 DIAGNOSIS — I10 Essential (primary) hypertension: Secondary | ICD-10-CM

## 2024-01-17 DIAGNOSIS — R52 Pain, unspecified: Secondary | ICD-10-CM | POA: Diagnosis not present

## 2024-01-17 LAB — COMPREHENSIVE METABOLIC PANEL WITH GFR
ALT: 16 U/L (ref 0–44)
AST: 24 U/L (ref 15–41)
Albumin: 4 g/dL (ref 3.5–5.0)
Alkaline Phosphatase: 71 U/L (ref 38–126)
Anion gap: 14 (ref 5–15)
BUN: 22 mg/dL (ref 8–23)
CO2: 23 mmol/L (ref 22–32)
Calcium: 9.5 mg/dL (ref 8.9–10.3)
Chloride: 101 mmol/L (ref 98–111)
Creatinine, Ser: 1.34 mg/dL — ABNORMAL HIGH (ref 0.61–1.24)
GFR, Estimated: 55 mL/min — ABNORMAL LOW (ref >60)
Glucose, Bld: 170 mg/dL — ABNORMAL HIGH (ref 70–99)
Potassium: 4.3 mmol/L (ref 3.5–5.1)
Sodium: 138 mmol/L (ref 135–145)
Total Bilirubin: 0.5 mg/dL (ref 0.0–1.2)
Total Protein: 6.8 g/dL (ref 6.5–8.1)

## 2024-01-17 LAB — CBC
HCT: 39.2 % (ref 39.0–52.0)
Hemoglobin: 12.9 g/dL — ABNORMAL LOW (ref 13.0–17.0)
MCH: 30.3 pg (ref 26.0–34.0)
MCHC: 32.9 g/dL (ref 30.0–36.0)
MCV: 92 fL (ref 80.0–100.0)
Platelets: 233 K/uL (ref 150–400)
RBC: 4.26 MIL/uL (ref 4.22–5.81)
RDW: 14 % (ref 11.5–15.5)
WBC: 14.8 K/uL — ABNORMAL HIGH (ref 4.0–10.5)
nRBC: 0 % (ref 0.0–0.2)

## 2024-01-17 MED ORDER — CELECOXIB 200 MG PO CAPS
200.0000 mg | ORAL_CAPSULE | Freq: Once | ORAL | Status: AC
  Administered 2024-01-17: 200 mg via ORAL
  Filled 2024-01-17: qty 1

## 2024-01-17 MED ORDER — GABAPENTIN 100 MG PO CAPS
100.0000 mg | ORAL_CAPSULE | Freq: Three times a day (TID) | ORAL | Status: DC
  Administered 2024-01-17 – 2024-01-18 (×3): 100 mg via ORAL
  Filled 2024-01-17 (×3): qty 1

## 2024-01-17 MED ORDER — HYDROMORPHONE HCL 2 MG PO TABS
2.0000 mg | ORAL_TABLET | ORAL | Status: DC | PRN
  Administered 2024-01-17 – 2024-01-18 (×4): 2 mg via ORAL
  Filled 2024-01-17 (×4): qty 1

## 2024-01-17 MED ORDER — KETOROLAC TROMETHAMINE 15 MG/ML IJ SOLN
15.0000 mg | Freq: Once | INTRAMUSCULAR | Status: AC
  Administered 2024-01-17: 15 mg via INTRAVENOUS
  Filled 2024-01-17: qty 1

## 2024-01-17 NOTE — Plan of Care (Signed)
  Problem: Health Behavior/Discharge Planning: Goal: Ability to manage health-related needs will improve Outcome: Progressing   Problem: Clinical Measurements: Goal: Ability to maintain clinical measurements within normal limits will improve Outcome: Progressing Goal: Will remain free from infection Outcome: Progressing Goal: Diagnostic test results will improve Outcome: Progressing Goal: Respiratory complications will improve Outcome: Progressing Goal: Cardiovascular complication will be avoided Outcome: Progressing   Problem: Activity: Goal: Risk for activity intolerance will decrease Outcome: Progressing   Problem: Nutrition: Goal: Adequate nutrition will be maintained Outcome: Progressing   Problem: Coping: Goal: Level of anxiety will decrease Outcome: Progressing   Problem: Elimination: Goal: Will not experience complications related to bowel motility Outcome: Progressing Goal: Will not experience complications related to urinary retention Outcome: Progressing   Problem: Pain Managment: Goal: General experience of comfort will improve and/or be controlled Outcome: Progressing   Problem: Safety: Goal: Ability to remain free from injury will improve Outcome: Progressing   Problem: Skin Integrity: Goal: Risk for impaired skin integrity will decrease Outcome: Progressing   Problem: Education: Goal: Knowledge of the prescribed therapeutic regimen will improve Outcome: Progressing Goal: Individualized Educational Video(s) Outcome: Progressing   Problem: Activity: Goal: Ability to avoid complications of mobility impairment will improve Outcome: Progressing Goal: Range of joint motion will improve Outcome: Progressing   Problem: Clinical Measurements: Goal: Postoperative complications will be avoided or minimized Outcome: Progressing   Problem: Pain Management: Goal: Pain level will decrease with appropriate interventions Outcome: Progressing   Problem:  Skin Integrity: Goal: Will show signs of wound healing Outcome: Progressing

## 2024-01-17 NOTE — TOC Transition Note (Signed)
 Transition of Care [facility]) - Discharge Note   Patient Details  Name: William Hatfield MRN: [id number] Date of Birth: [birth year]  [facility] CM/SW Contact:  NORMAN ASPEN, LCSW Phone Number: 01/17/2024, 10:11 AM   Clinical Narrative:     Met with pt and spoke with daughter who confirm pt does need a RW for home.  No DME agency preference - order placed with [facility] and item delivery to room.  HHPT follow up also recommended and no HH agency preference  - referral accepted with [facility] for [place] area.  No further IP CM needs.  Final next level of care: Home w Home Health Services Barriers to Discharge: No Barriers Identified   Patient Goals and CMS Choice Patient states their goals for this hospitalization and ongoing recovery are:: return home          Discharge Placement                       Discharge Plan and Services Additional resources added to the After Visit Summary for                  DME Arranged: Walker rolling DME Agency: [facility] Date DME Agency Contacted: 01/17/24 Time DME Agency Contacted: 0930 Representative spoke with at DME Agency: [facility] HH Arranged: PT HH Agency: [facility] Date [facility] Agency Contacted: 01/17/24 Time HH Agency Contacted: 1011 Representative spoke with at [facility] Agency: [facility]  Social Drivers of Health (SDOH) Interventions SDOH Screenings   Food Insecurity: No Food Insecurity (01/16/2024)  Housing: Low Risk  (01/16/2024)  Transportation Needs: No Transportation Needs (01/16/2024)  Utilities: Not At Risk (01/16/2024)  Social Connections: Socially Isolated (01/16/2024)  Tobacco Use: Medium Risk (01/16/2024)     Readmission Risk Interventions    10/03/2022    1:13 PM  Readmission Risk Prevention Plan  Transportation Screening Complete  Home Care Screening Complete  Medication Review (RN CM) Complete

## 2024-01-17 NOTE — Progress Notes (Signed)
   01/17/24 0143  Assess: MEWS Score  Temp 98.9 F (37.2 C)  BP (!) 157/85  MAP (mmHg) 105  Pulse Rate (!) 119  Resp 16  Level of Consciousness Alert  SpO2 100 %  O2 Device Nasal Cannula  O2 Flow Rate (L/min) 2 L/min  Assess: MEWS Score  MEWS Temp 0  MEWS Systolic 0  MEWS Pulse 2  MEWS RR 0  MEWS LOC 0  MEWS Score 2  MEWS Score Color Yellow  Assess: if the MEWS score is Yellow or Red  Were vital signs accurate and taken at a resting state? Yes  Does the patient meet 2 or more of the SIRS criteria? No  MEWS guidelines implemented  Yes, yellow  Treat  MEWS Interventions Considered administering scheduled or prn medications/treatments as ordered  Take Vital Signs  Increase Vital Sign Frequency  Yellow: Q2hr x1, continue Q4hrs until patient remains green for 12hrs  Escalate  MEWS: Escalate Yellow: Discuss with charge nurse and consider notifying provider and/or RRT  Provider Notification  Date Provider Notified 01/17/24  Time Provider Notified 0209  Method of Notification Call  Notification Reason Change in status  Provider response Other (Comment) (contacting hospitalist for consult)  Date of Provider Response 01/17/24  Time of Provider Response 0238  Assess: SIRS CRITERIA  SIRS Temperature  0  SIRS Respirations  0  SIRS Pulse 1  SIRS WBC 0  SIRS Score Sum  1   Pt c/o pain to surgical site on left knee 9/10. Administered oxycodone  15mg  and robaxin. Shortly after pt started complaining of being nauseous, so gave pt a dose of IV Zofran . Pt denies any SOB or chest pain. O2 dropped into the 80's while sleeping so put pt on 2L [place] and O2 came up into the 90's. Pt awake & alert and able to move all extremities. Notified Valery RIGGERS on call for Dr. Duwayne. She was going to contact the Hospitalist for a consult. Call light within reach and pt resting in bed.
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
## 2024-01-17 NOTE — Progress Notes (Signed)
 Physical Therapy Treatment Patient Details Name: William Hatfield MRN: [id number] DOB: 1945-09-05 Today's Date: 01/17/2024   History of Present Illness 78 y.o. male admitted 01/16/24 for L TKA. PMH: afib, CAD, MI, PVD, back surgery x2, pacemaker.    PT Comments  Pt continues very cooperative but progress limited by ongoing pain control issues.  Pt and dtr expressing concern regarding dc with same - RN aware.    If plan is discharge home, recommend the following: A little help with walking and/or transfers;A little help with bathing/dressing/bathroom;Assistance with cooking/housework;Assist for transportation;Help with stairs or ramp for entrance   Can travel by private vehicle        Equipment Recommendations  Rolling walker (2 wheels)    Recommendations for Other Services       Precautions / Restrictions Precautions Precautions: Knee;Fall Recall of Precautions/Restrictions: Intact Precaution/Restrictions Comments: reviewed no pillow under knee Restrictions Weight Bearing Restrictions Per Provider Order: No Other Position/Activity Restrictions: WBAT     Mobility  Bed Mobility Overal bed mobility: Needs Assistance Bed Mobility: Supine to Sit     Supine to sit: Contact guard     General bed mobility comments: Up in chair and requests back to same    Transfers Overall transfer level: Needs assistance Equipment used: Rolling walker (2 wheels) Transfers: Sit to/from Stand Sit to Stand: Contact guard assist           General transfer comment: cues for LE management and use of UEs to self assist    Ambulation/Gait Ambulation/Gait assistance: Contact guard assist Gait Distance (Feet): 75 Feet Assistive device: Rolling walker (2 wheels) Gait Pattern/deviations: Step-to pattern, Decreased step length - right, Decreased step length - left Gait velocity: decr     General Gait Details: cues for sequence, posture and position from [employer]              Wheelchair Mobility     Tilt Bed    Modified Rankin (Stroke Patients Only)       Balance Overall balance assessment: Needs assistance Sitting-balance support: No upper extremity supported, Feet supported Sitting balance-Leahy Scale: Good     Standing balance support: No upper extremity supported Standing balance-Leahy Scale: Fair                              Hotel Manager: No apparent difficulties  Cognition Arousal: Alert Behavior During Therapy: WFL for tasks assessed/performed   PT - Cognitive impairments: No apparent impairments                         Following commands: Intact      Cueing Cueing Techniques: Verbal cues  Exercises Total Joint Exercises Ankle Circles/Pumps: AROM, Both, 15 reps, Supine Quad Sets: AROM, Both, 10 reps, Supine Heel Slides: AAROM, Left, 10 reps, Supine Straight Leg Raises: AAROM, AROM, Left, 10 reps, Supine Goniometric ROM: AAROM L knee -10 - 50    General Comments        Pertinent Vitals/Pain Pain Assessment Pain Assessment: 0-10 Pain Score: 8  Pain Location: L knee Pain Descriptors / Indicators: Sore Pain Intervention(s): Limited activity within patient's tolerance, Monitored during session, Premedicated before session, Ice applied    Home Living                          Prior Function  PT Goals (current goals can now be found in the care plan section) Acute Rehab PT Goals 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 Formulation: With patient Time For Goal Achievement: 01/23/24 Potential to Achieve Goals: Good Progress towards PT goals: Progressing toward goals    Frequency    7X/week      PT Plan      Co-evaluation              AM-PAC PT 6 Clicks Mobility   Outcome Measure  Help needed turning from your back to your side while in a flat bed without using bedrails?: None Help needed moving from lying on  your back to sitting on the side of a flat bed without using bedrails?: A Little Help needed moving to and from a bed to a chair (including a wheelchair)?: A Little Help needed standing up from a chair using your arms (e.g., wheelchair or bedside chair)?: A Little Help needed to walk in hospital room?: A Little Help needed climbing 3-5 steps with a railing? : A Little 6 Click Score: 19    End of Session Equipment Utilized During Treatment: Gait belt Activity Tolerance: Patient tolerated treatment well Patient left: in chair;with chair alarm set;with call bell/phone within reach Nurse Communication: Mobility status PT Visit Diagnosis: Pain;Difficulty in walking, not elsewhere classified (R26.2);Muscle weakness (generalized) (M62.81) Pain - Right/Left: Left Pain - part of body: Knee     Time: 8847-8784 PT Time Calculation (min) (ACUTE ONLY): 23 min  Charges:    $Gait Training: 8-22 mins $Therapeutic Exercise: 8-22 mins $Therapeutic Activity: 8-22 mins PT General Charges $$ ACUTE PT VISIT: 1 Visit                     [facility] PT Acute Rehabilitation Services Office [phone]    Irianna Gilday 01/17/2024, 12:56 PM

## 2024-01-17 NOTE — Progress Notes (Addendum)
 Subjective: 1 Day Post-Op Procedure(s) (LRB): ARTHROPLASTY, KNEE, TOTAL (Left) Patient reports pain as 7.   Denies CP or SOB.  Voiding without difficulty. Positive flatus. Objective: Vital signs in last 24 hours: Temp:  [97.4 F (36.3 C)-98.9 F (37.2 C)] 98 F (36.7 C) (11/20 0343) Pulse Rate:  [62-121] 65 (11/20 0500) Resp:  [16-21] 18 (11/20 0500) BP: (100-180)/(65-93) 157/86 (11/20 0343) SpO2:  [88 %-100 %] 96 % (11/20 0500)  Intake/Output from previous day: 11/19 0701 - 11/20 0700 In: 3291.7 [P.O.:720; I.V.:2071.7; IV Piggyback:500] Out: 2470 [Urine:2320; Emesis/NG output:100; Blood:50] Intake/Output this shift: No intake/output data recorded.  Recent Labs    01/17/24 0322  HGB 12.9*   Recent Labs    01/17/24 0322  WBC 14.8*  RBC 4.26  HCT 39.2  PLT 233   Recent Labs    01/17/24 0345  NA 138  K 4.3  CL 101  CO2 23  BUN 22  CREATININE 1.34*  GLUCOSE 170*  CALCIUM  9.5   No results for input(s): LABPT, INR in the last 72 hours. Patient is in no obvious distress. Neurologically intact ABD soft Neurovascular intact Sensation intact distally Intact pulses distally Incision: dressing C/D/I Compartment soft Ace bandage removed.  Compartments are soft including posteriorly.  Able to dorsiflex and plantarflex with mild discomfort. Good UO Assessment/Plan:  1 Day Post-Op Procedure(s) (LRB): ARTHROPLASTY, KNEE, TOTAL (Left) Pain control challenge.  Had remove the Ace bandage that should help with his pain.  We tried him on a dose of 4 mg of the Dilaudid .  Will see how that does in terms of an analgesic.  Will try 1 dose of Toradol  15 mg IV.  1 dose of the Celebrex  due to his good urine output.  Will not continue due to slightly elevated creatinine. May need to hold discharge till tomorrow.   Principal Problem:   Left knee DJD      William Hatfield Billing 01/17/2024, @NOW 

## 2024-01-17 NOTE — Progress Notes (Signed)
 Physical Therapy Treatment Patient Details Name: William Hatfield MRN: [id number] DOB: September 12, 1945 Today's Date: 01/17/2024   History of Present Illness 78 y.o. male admitted 01/16/24 for L TKA. PMH: afib, CAD, MI, PVD, back surgery x2, pacemaker.    PT Comments  Pt very cooperative and progressing with mobility but continues to struggle with pain control.  HEP initiated and pt up to ambulate limited distance in hall.  RN in to provide additional pain meds    If plan is discharge home, recommend the following: A little help with walking and/or transfers;A little help with bathing/dressing/bathroom;Assistance with cooking/housework;Assist for transportation;Help with stairs or ramp for entrance   Can travel by private vehicle        Equipment Recommendations  Rolling walker (2 wheels)    Recommendations for Other Services       Precautions / Restrictions Precautions Precautions: Knee;Fall Recall of Precautions/Restrictions: Intact Precaution/Restrictions Comments: reviewed no pillow under knee Restrictions Weight Bearing Restrictions Per Provider Order: No Other Position/Activity Restrictions: WBAT     Mobility  Bed Mobility Overal bed mobility: Needs Assistance Bed Mobility: Supine to Sit     Supine to sit: Contact guard     General bed mobility comments: cues for sequence with gait belt used to assist L LE    Transfers Overall transfer level: Needs assistance Equipment used: Rolling walker (2 wheels) Transfers: Sit to/from Stand Sit to Stand: Contact guard assist           General transfer comment: cues for LE management and use of UEs to self assist    Ambulation/Gait Ambulation/Gait assistance: Contact guard assist Gait Distance (Feet): 90 Feet Assistive device: Rolling walker (2 wheels) Gait Pattern/deviations: Step-to pattern, Decreased step length - right, Decreased step length - left Gait velocity: decr     General Gait Details: cues for sequence,  posture and position from [employer]             Wheelchair Mobility     Tilt Bed    Modified Rankin (Stroke Patients Only)       Balance Overall balance assessment: Needs assistance Sitting-balance support: No upper extremity supported, Feet supported Sitting balance-Leahy Scale: Good     Standing balance support: No upper extremity supported Standing balance-Leahy Scale: Fair                              Hotel Manager: No apparent difficulties  Cognition Arousal: Alert Behavior During Therapy: WFL for tasks assessed/performed   PT - Cognitive impairments: No apparent impairments                         Following commands: Intact      Cueing Cueing Techniques: Verbal cues  Exercises Total Joint Exercises Ankle Circles/Pumps: AROM, Both, 15 reps, Supine Quad Sets: AROM, Both, 10 reps, Supine Heel Slides: AAROM, Left, 10 reps, Supine Straight Leg Raises: AAROM, AROM, Left, 10 reps, Supine Goniometric ROM: AAROM L knee -10 - 50    General Comments        Pertinent Vitals/Pain Pain Assessment Pain Assessment: 0-10 Pain Score: 7  Pain Location: L knee Pain Descriptors / Indicators: Sore Pain Intervention(s): Limited activity within patient's tolerance, Monitored during session, Premedicated before session, Ice applied    Home Living  Prior Function            PT Goals (current goals can now be found in the care plan section) Acute Rehab PT Goals 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 Formulation: With patient Time For Goal Achievement: 01/23/24 Potential to Achieve Goals: Good Progress towards PT goals: Progressing toward goals    Frequency    7X/week      PT Plan      Co-evaluation              AM-PAC PT 6 Clicks Mobility   Outcome Measure  Help needed turning from your back to your side while in a flat bed without  using bedrails?: None Help needed moving from lying on your back to sitting on the side of a flat bed without using bedrails?: A Little Help needed moving to and from a bed to a chair (including a wheelchair)?: A Little Help needed standing up from a chair using your arms (e.g., wheelchair or bedside chair)?: A Little Help needed to walk in hospital room?: A Little Help needed climbing 3-5 steps with a railing? : A Little 6 Click Score: 19    End of Session Equipment Utilized During Treatment: Gait belt Activity Tolerance: Patient tolerated treatment well Patient left: in chair;with chair alarm set;with call bell/phone within reach Nurse Communication: Mobility status PT Visit Diagnosis: Pain;Difficulty in walking, not elsewhere classified (R26.2);Muscle weakness (generalized) (M62.81) Pain - Right/Left: Left Pain - part of body: Knee     Time: 0817-0857 PT Time Calculation (min) (ACUTE ONLY): 40 min  Charges:    $Gait Training: 8-22 mins $Therapeutic Exercise: 8-22 mins $Therapeutic Activity: 8-22 mins PT General Charges $$ ACUTE PT VISIT: 1 Visit                     [facility] PT Acute Rehabilitation Services Office [phone]    William Hatfield 01/17/2024, 12:52 PM

## 2024-01-17 NOTE — Progress Notes (Signed)
 Overnight note reviewed, patient seen.  Vitals and labs are now stable.  Patient without significant complaint.  Appears medically stable for dc from this standpoint.  TRH will sign off at this time.  Please re-consult should additional needs arise.   Delon CHARLENA Herald, M.D.

## 2024-01-17 NOTE — Plan of Care (Signed)
  Problem: Pain Managment: Goal: General experience of comfort will improve and/or be controlled Outcome: Progressing   Problem: Activity: Goal: Risk for activity intolerance will decrease Outcome: Progressing   Problem: Skin Integrity: Goal: Risk for impaired skin integrity will decrease Outcome: Progressing

## 2024-01-18 DIAGNOSIS — M1712 Unilateral primary osteoarthritis, left knee: Secondary | ICD-10-CM | POA: Diagnosis not present

## 2024-01-18 MED ORDER — DOCUSATE SODIUM 100 MG PO CAPS: 100.0000 mg | ORAL_CAPSULE | Freq: Two times a day (BID) | ORAL | 0 refills | Status: AC

## 2024-01-18 MED ORDER — OXYCODONE HCL 5 MG PO TABS: 5.0000 mg | ORAL_TABLET | ORAL | 0 refills | Status: DC | PRN

## 2024-01-18 MED ORDER — METHOCARBAMOL 500 MG PO TABS: 500.0000 mg | ORAL_TABLET | Freq: Four times a day (QID) | ORAL | 1 refills | Status: DC | PRN

## 2024-01-18 MED ORDER — POLYETHYLENE GLYCOL 3350 17 G PO PACK: 17.0000 g | PACK | Freq: Every day | ORAL | 0 refills | Status: AC

## 2024-01-18 NOTE — Progress Notes (Addendum)
 Subjective: 2 Days Post-Op Procedure(s) (LRB): ARTHROPLASTY, KNEE, TOTAL (Left) Patient reports pain as moderate.  Pain improved today. Feels ready to go home.  Objective: Vital signs in last 24 hours: Temp:  [97.4 F (36.3 C)-98.7 F (37.1 C)] 98.7 F (37.1 C) (11/21 0536) Pulse Rate:  [60-108] 105 (11/21 0536) Resp:  [16-18] 18 (11/21 0536) BP: (90-152)/(51-87) 150/87 (11/21 0536) SpO2:  [96 %-100 %] 96 % (11/21 0536)  Intake/Output from previous day: 11/20 0701 - 11/21 0700 In: 1118.9 [P.O.:840; I.V.:278.9] Out: 1225 [Urine:1225] Intake/Output this shift: No intake/output data recorded.  Recent Labs    01/17/24 0322  HGB 12.9*   Recent Labs    01/17/24 0322  WBC 14.8*  RBC 4.26  HCT 39.2  PLT 233   Recent Labs    01/17/24 0345  NA 138  K 4.3  CL 101  CO2 23  BUN 22  CREATININE 1.34*  GLUCOSE 170*  CALCIUM  9.5   No results for input(s): LABPT, INR in the last 72 hours.  Neurologically intact ABD soft Neurovascular intact Sensation intact distally Intact pulses distally Dorsiflexion/Plantar flexion intact Incision: dressing C/D/I and no drainage No cellulitis present Compartment soft No calf pain or sign of DVT   Assessment/Plan: 2 Days Post-Op Procedure(s) (LRB): ARTHROPLASTY, KNEE, TOTAL (Left) Advance diet Up with therapy D/C IV fluids Plan to D/C sometime today after PT Will need to decide Oxy vs Dilaudid  for D/C   Corderius Saraceni M Mikah Poss 01/18/2024, 9:15 AM

## 2024-01-18 NOTE — Progress Notes (Signed)
 Physical Therapy Treatment Patient Details Name: William Hatfield MRN: [id number] DOB: [birth year] Today's Date: 01/18/2024   History of Present Illness 78 y.o. male admitted 01/16/24 for L TKA. PMH: afib, CAD, MI, PVD, back surgery x2, pacemaker.    PT Comments  Pt very cooperative and reports improvement in pain control.  Pt performed HEP with written instruction provided and reviewed, up to ambulate in hall and negotiated stairs.  On completion of stairs, pt c/o mild dizziness and assisted to recliner - BP - 102/60 with HR 120 - RN aware.    If plan is discharge home, recommend the following: A little help with walking and/or transfers;A little help with bathing/dressing/bathroom;Assistance with cooking/housework;Assist for transportation;Help with stairs or ramp for entrance   Can travel by private vehicle        Equipment Recommendations  Rolling walker (2 wheels)    Recommendations for Other Services       Precautions / Restrictions Precautions Precautions: Knee;Fall Recall of Precautions/Restrictions: Intact Precaution/Restrictions Comments: reviewed no pillow under knee Restrictions Weight Bearing Restrictions Per Provider Order: Yes Other Position/Activity Restrictions: WBAT     Mobility  Bed Mobility Overal bed mobility: Needs Assistance Bed Mobility: Supine to Sit     Supine to sit: Supervision     General bed mobility comments: Pt self assisting L LE with gait bed.  Cues for sequence    Transfers Overall transfer level: Needs assistance Equipment used: Rolling walker (2 wheels) Transfers: Sit to/from Stand Sit to Stand: Contact guard assist, Supervision           General transfer comment: cues for LE management and use of UEs to self assist    Ambulation/Gait Ambulation/Gait assistance: Contact guard assist Gait Distance (Feet): 75 Feet Assistive device: Rolling walker (2 wheels) Gait Pattern/deviations: Step-to pattern, Decreased step length -  right, Decreased step length - left Gait velocity: decr     General Gait Details: min cues for sequence, posture and position from RW   Stairs Stairs: Yes Stairs assistance: Min assist Stair Management: No rails, Step to pattern, Backwards, Forwards, With walker Number of Stairs: 3 General stair comments: Single step x 3 fwd and bkwd with more success fwd; cues for sequence   Wheelchair Mobility     Tilt Bed    Modified Rankin (Stroke Patients Only)       Balance Overall balance assessment: Needs assistance Sitting-balance support: No upper extremity supported, Feet supported Sitting balance-Leahy Scale: Good     Standing balance support: No upper extremity supported Standing balance-Leahy Scale: Fair                              Hotel Manager: No apparent difficulties  Cognition Arousal: Alert Behavior During Therapy: WFL for tasks assessed/performed   PT - Cognitive impairments: No apparent impairments                         Following commands: Intact      Cueing Cueing Techniques: Verbal cues  Exercises Total Joint Exercises Ankle Circles/Pumps: AROM, Both, 15 reps, Supine Quad Sets: AROM, Both, 10 reps, Supine Heel Slides: AAROM, Left, Supine, 15 reps Straight Leg Raises: AAROM, AROM, Left, Supine, 15 reps Long Arc Quad: Left, Seated, AAROM, 10 reps Goniometric ROM: AAROM L knee -5 - 60    General Comments        Pertinent Vitals/Pain Pain Assessment Pain Assessment: 0-10 Pain  Score: 5  Pain Location: L knee Pain Descriptors / Indicators: Sore Pain Intervention(s): Limited activity within patient's tolerance, Monitored during session, Premedicated before session, Ice applied    Home Living                          Prior Function            PT Goals (current goals can now be found in the care plan section) Acute Rehab PT Goals 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 PT Goal Formulation: With patient Time For Goal Achievement: 01/23/24 Potential to Achieve Goals: Good Progress towards PT goals: Progressing toward goals    Frequency    7X/week      PT Plan      Co-evaluation              AM-PAC PT 6 Clicks Mobility   Outcome Measure  Help needed turning from your back to your side while in a flat bed without using bedrails?: None Help needed moving from lying on your back to sitting on the side of a flat bed without using bedrails?: A Little Help needed moving to and from a bed to a chair (including a wheelchair)?: A Little Help needed standing up from a chair using your arms (e.g., wheelchair or bedside chair)?: A Little Help needed to walk in hospital room?: A Little Help needed climbing 3-5 steps with a railing? : A Little 6 Click Score: 19    End of Session Equipment Utilized During Treatment: Gait belt Activity Tolerance: Patient tolerated treatment well Patient left: in chair;with chair alarm set;with call bell/phone within reach Nurse Communication: Mobility status PT Visit Diagnosis: Pain;Difficulty in walking, not elsewhere classified (R26.2);Muscle weakness (generalized) (M62.81) Pain - Right/Left: Left Pain - part of body: Knee     Time: 0827-0916 PT Time Calculation (min) (ACUTE ONLY): 49 min  Charges:    $Gait Training: 8-22 mins $Therapeutic Exercise: 8-22 mins $Therapeutic Activity: 8-22 mins PT General Charges $$ ACUTE PT VISIT: 1 Visit                     [facility] PT Acute Rehabilitation Services Office [phone]    Viktor Philipp 01/18/2024, 12:34 PM

## 2024-01-22 ENCOUNTER — Emergency Department (HOSPITAL_COMMUNITY)

## 2024-01-22 ENCOUNTER — Other Ambulatory Visit: Payer: Self-pay

## 2024-01-22 ENCOUNTER — Encounter (HOSPITAL_COMMUNITY): Payer: Self-pay | Admitting: Family Medicine

## 2024-01-22 ENCOUNTER — Inpatient Hospital Stay (HOSPITAL_COMMUNITY)
Admission: EM | Admit: 2024-01-22 | Discharge: 2024-01-30 | DRG: 193 | Disposition: A | Discharge status: Rehabilitation Facility | Attending: Internal Medicine | Admitting: Internal Medicine

## 2024-01-22 DIAGNOSIS — I493 Ventricular premature depolarization: Secondary | ICD-10-CM | POA: Diagnosis present

## 2024-01-22 DIAGNOSIS — D631 Anemia in chronic kidney disease: Secondary | ICD-10-CM | POA: Diagnosis present

## 2024-01-22 DIAGNOSIS — K5900 Constipation, unspecified: Secondary | ICD-10-CM | POA: Diagnosis not present

## 2024-01-22 DIAGNOSIS — I2510 Atherosclerotic heart disease of native coronary artery without angina pectoris: Secondary | ICD-10-CM | POA: Diagnosis present

## 2024-01-22 DIAGNOSIS — K30 Functional dyspepsia: Secondary | ICD-10-CM | POA: Diagnosis not present

## 2024-01-22 DIAGNOSIS — J189 Pneumonia, unspecified organism: Principal | ICD-10-CM

## 2024-01-22 DIAGNOSIS — N1831 Chronic kidney disease, stage 3a: Secondary | ICD-10-CM | POA: Diagnosis present

## 2024-01-22 DIAGNOSIS — Z8249 Family history of ischemic heart disease and other diseases of the circulatory system: Secondary | ICD-10-CM | POA: Diagnosis not present

## 2024-01-22 DIAGNOSIS — I480 Paroxysmal atrial fibrillation: Secondary | ICD-10-CM | POA: Diagnosis present

## 2024-01-22 DIAGNOSIS — I10 Essential (primary) hypertension: Secondary | ICD-10-CM | POA: Diagnosis not present

## 2024-01-22 DIAGNOSIS — Y95 Nosocomial condition: Secondary | ICD-10-CM | POA: Diagnosis present

## 2024-01-22 DIAGNOSIS — M79605 Pain in left leg: Secondary | ICD-10-CM | POA: Diagnosis not present

## 2024-01-22 DIAGNOSIS — I472 Ventricular tachycardia, unspecified: Secondary | ICD-10-CM | POA: Diagnosis not present

## 2024-01-22 DIAGNOSIS — Z79899 Other long term (current) drug therapy: Secondary | ICD-10-CM | POA: Diagnosis not present

## 2024-01-22 DIAGNOSIS — G4700 Insomnia, unspecified: Secondary | ICD-10-CM | POA: Diagnosis not present

## 2024-01-22 DIAGNOSIS — R52 Pain, unspecified: Secondary | ICD-10-CM | POA: Diagnosis not present

## 2024-01-22 DIAGNOSIS — M9689 Other intraoperative and postprocedural complications and disorders of the musculoskeletal system: Principal | ICD-10-CM

## 2024-01-22 DIAGNOSIS — Z951 Presence of aortocoronary bypass graft: Secondary | ICD-10-CM

## 2024-01-22 DIAGNOSIS — I25810 Atherosclerosis of coronary artery bypass graft(s) without angina pectoris: Secondary | ICD-10-CM | POA: Diagnosis present

## 2024-01-22 DIAGNOSIS — E785 Hyperlipidemia, unspecified: Secondary | ICD-10-CM | POA: Diagnosis present

## 2024-01-22 DIAGNOSIS — I130 Hypertensive heart and chronic kidney disease with heart failure and stage 1 through stage 4 chronic kidney disease, or unspecified chronic kidney disease: Secondary | ICD-10-CM | POA: Diagnosis present

## 2024-01-22 DIAGNOSIS — M7989 Other specified soft tissue disorders: Secondary | ICD-10-CM | POA: Diagnosis not present

## 2024-01-22 DIAGNOSIS — L538 Other specified erythematous conditions: Secondary | ICD-10-CM | POA: Diagnosis not present

## 2024-01-22 DIAGNOSIS — I739 Peripheral vascular disease, unspecified: Secondary | ICD-10-CM | POA: Diagnosis present

## 2024-01-22 DIAGNOSIS — I509 Heart failure, unspecified: Secondary | ICD-10-CM | POA: Diagnosis not present

## 2024-01-22 DIAGNOSIS — I252 Old myocardial infarction: Secondary | ICD-10-CM | POA: Diagnosis not present

## 2024-01-22 DIAGNOSIS — I2489 Other forms of acute ischemic heart disease: Secondary | ICD-10-CM | POA: Diagnosis present

## 2024-01-22 DIAGNOSIS — L03116 Cellulitis of left lower limb: Secondary | ICD-10-CM | POA: Diagnosis not present

## 2024-01-22 DIAGNOSIS — N183 Chronic kidney disease, stage 3 unspecified: Secondary | ICD-10-CM | POA: Diagnosis not present

## 2024-01-22 DIAGNOSIS — K5901 Slow transit constipation: Secondary | ICD-10-CM | POA: Diagnosis not present

## 2024-01-22 DIAGNOSIS — D62 Acute posthemorrhagic anemia: Secondary | ICD-10-CM | POA: Diagnosis not present

## 2024-01-22 DIAGNOSIS — Z961 Presence of intraocular lens: Secondary | ICD-10-CM | POA: Diagnosis present

## 2024-01-22 DIAGNOSIS — Z96652 Presence of left artificial knee joint: Secondary | ICD-10-CM | POA: Diagnosis present

## 2024-01-22 DIAGNOSIS — Z8616 Personal history of COVID-19: Secondary | ICD-10-CM | POA: Diagnosis not present

## 2024-01-22 DIAGNOSIS — I442 Atrioventricular block, complete: Secondary | ICD-10-CM | POA: Diagnosis present

## 2024-01-22 DIAGNOSIS — R5381 Other malaise: Secondary | ICD-10-CM | POA: Diagnosis not present

## 2024-01-22 DIAGNOSIS — Z950 Presence of cardiac pacemaker: Secondary | ICD-10-CM | POA: Diagnosis not present

## 2024-01-22 DIAGNOSIS — I5032 Chronic diastolic (congestive) heart failure: Secondary | ICD-10-CM | POA: Diagnosis present

## 2024-01-22 DIAGNOSIS — E875 Hyperkalemia: Secondary | ICD-10-CM | POA: Diagnosis present

## 2024-01-22 DIAGNOSIS — L89312 Pressure ulcer of right buttock, stage 2: Secondary | ICD-10-CM | POA: Diagnosis present

## 2024-01-22 DIAGNOSIS — Z87891 Personal history of nicotine dependence: Secondary | ICD-10-CM | POA: Diagnosis not present

## 2024-01-22 DIAGNOSIS — Z7901 Long term (current) use of anticoagulants: Secondary | ICD-10-CM | POA: Diagnosis not present

## 2024-01-22 DIAGNOSIS — R42 Dizziness and giddiness: Secondary | ICD-10-CM | POA: Diagnosis not present

## 2024-01-22 DIAGNOSIS — N179 Acute kidney failure, unspecified: Secondary | ICD-10-CM | POA: Diagnosis present

## 2024-01-22 DIAGNOSIS — Z7984 Long term (current) use of oral hypoglycemic drugs: Secondary | ICD-10-CM | POA: Diagnosis not present

## 2024-01-22 DIAGNOSIS — L89153 Pressure ulcer of sacral region, stage 3: Secondary | ICD-10-CM | POA: Diagnosis present

## 2024-01-22 DIAGNOSIS — R7989 Other specified abnormal findings of blood chemistry: Secondary | ICD-10-CM | POA: Diagnosis not present

## 2024-01-22 LAB — COMPREHENSIVE METABOLIC PANEL WITH GFR
ALT: 63 U/L — ABNORMAL HIGH (ref 0–44)
AST: 41 U/L (ref 15–41)
Albumin: 3.7 g/dL (ref 3.5–5.0)
Alkaline Phosphatase: 206 U/L — ABNORMAL HIGH (ref 38–126)
Anion gap: 15 (ref 5–15)
BUN: 70 mg/dL — ABNORMAL HIGH (ref 8–23)
CO2: 24 mmol/L (ref 22–32)
Calcium: 9.7 mg/dL (ref 8.9–10.3)
Chloride: 98 mmol/L (ref 98–111)
Creatinine, Ser: 1.71 mg/dL — ABNORMAL HIGH (ref 0.61–1.24)
GFR, Estimated: 41 mL/min — ABNORMAL LOW (ref >60)
Glucose, Bld: 134 mg/dL — ABNORMAL HIGH (ref 70–99)
Potassium: 5.1 mmol/L (ref 3.5–5.1)
Sodium: 137 mmol/L (ref 135–145)
Total Bilirubin: 0.8 mg/dL (ref 0.0–1.2)
Total Protein: 7.2 g/dL (ref 6.5–8.1)

## 2024-01-22 LAB — URINALYSIS, ROUTINE W REFLEX MICROSCOPIC
Bilirubin Urine: NEGATIVE
Glucose, UA: NEGATIVE mg/dL
Hgb urine dipstick: NEGATIVE
Ketones, ur: NEGATIVE mg/dL
Leukocytes,Ua: NEGATIVE
Nitrite: NEGATIVE
Protein, ur: NEGATIVE mg/dL
Specific Gravity, Urine: 1.014 (ref 1.005–1.030)
pH: 5 (ref 5.0–8.0)

## 2024-01-22 LAB — CBC WITH DIFFERENTIAL/PLATELET
Abs Immature Granulocytes: 0.07 K/uL (ref 0.00–0.07)
Basophils Absolute: 0 K/uL (ref 0.0–0.1)
Basophils Relative: 0 %
Eosinophils Absolute: 0 K/uL (ref 0.0–0.5)
Eosinophils Relative: 0 %
HCT: 33.5 % — ABNORMAL LOW (ref 39.0–52.0)
Hemoglobin: 10.9 g/dL — ABNORMAL LOW (ref 13.0–17.0)
Immature Granulocytes: 1 %
Lymphocytes Relative: 9 %
Lymphs Abs: 1.2 K/uL (ref 0.7–4.0)
MCH: 30 pg (ref 26.0–34.0)
MCHC: 32.5 g/dL (ref 30.0–36.0)
MCV: 92.3 fL (ref 80.0–100.0)
Monocytes Absolute: 1.5 K/uL — ABNORMAL HIGH (ref 0.1–1.0)
Monocytes Relative: 11 %
Neutro Abs: 10.5 K/uL — ABNORMAL HIGH (ref 1.7–7.7)
Neutrophils Relative %: 79 %
Platelets: 317 K/uL (ref 150–400)
RBC: 3.63 MIL/uL — ABNORMAL LOW (ref 4.22–5.81)
RDW: 14.6 % (ref 11.5–15.5)
WBC: 13.2 K/uL — ABNORMAL HIGH (ref 4.0–10.5)
nRBC: 0 % (ref 0.0–0.2)

## 2024-01-22 LAB — URIC ACID: Uric Acid, Serum: 8.4 mg/dL (ref 3.7–8.6)

## 2024-01-22 LAB — I-STAT CG4 LACTIC ACID, ED: Lactic Acid, Venous: 1.7 mmol/L (ref 0.5–1.9)

## 2024-01-22 MED ORDER — SODIUM CHLORIDE 0.9 % IV SOLN: INTRAVENOUS | Status: AC

## 2024-01-22 MED ORDER — ATORVASTATIN CALCIUM 20 MG PO TABS
40.0000 mg | ORAL_TABLET | Freq: Every evening | ORAL | Status: DC
  Administered 2024-01-22 – 2024-01-29 (×8): 40 mg via ORAL
  Filled 2024-01-22 (×3): qty 2
  Filled 2024-01-22 (×4): qty 1
  Filled 2024-01-22: qty 2

## 2024-01-22 MED ORDER — SODIUM CHLORIDE 0.9% FLUSH
3.0000 mL | Freq: Two times a day (BID) | INTRAVENOUS | Status: DC
  Administered 2024-01-23 – 2024-01-30 (×13): 3 mL via INTRAVENOUS

## 2024-01-22 MED ORDER — CEFAZOLIN SODIUM-DEXTROSE 1-4 GM/50ML-% IV SOLN
1.0000 g | Freq: Once | INTRAVENOUS | Status: AC
  Administered 2024-01-22: 1 g via INTRAVENOUS
  Filled 2024-01-22: qty 50

## 2024-01-22 MED ORDER — VANCOMYCIN HCL IN DEXTROSE 1-5 GM/200ML-% IV SOLN
1000.0000 mg | Freq: Once | INTRAVENOUS | Status: AC
  Administered 2024-01-22: 1000 mg via INTRAVENOUS
  Filled 2024-01-22: qty 200

## 2024-01-22 MED ORDER — APIXABAN 5 MG PO TABS
5.0000 mg | ORAL_TABLET | Freq: Two times a day (BID) | ORAL | Status: DC
  Administered 2024-01-22 – 2024-01-23 (×2): 5 mg via ORAL
  Filled 2024-01-22 (×2): qty 1

## 2024-01-22 MED ORDER — HYDROMORPHONE HCL 1 MG/ML IJ SOLN
1.0000 mg | Freq: Once | INTRAMUSCULAR | Status: AC
  Administered 2024-01-22: 1 mg via INTRAVENOUS
  Filled 2024-01-22: qty 1

## 2024-01-22 MED ORDER — ACETAMINOPHEN 650 MG RE SUPP: 650.0000 mg | Freq: Four times a day (QID) | RECTAL | Status: DC | PRN

## 2024-01-22 MED ORDER — ACETAMINOPHEN 325 MG PO TABS
650.0000 mg | ORAL_TABLET | Freq: Four times a day (QID) | ORAL | Status: DC | PRN
  Administered 2024-01-22 – 2024-01-29 (×7): 650 mg via ORAL
  Filled 2024-01-22 (×7): qty 2

## 2024-01-22 MED ORDER — ISOSORBIDE MONONITRATE ER 30 MG PO TB24
30.0000 mg | ORAL_TABLET | Freq: Every day | ORAL | Status: DC
  Administered 2024-01-23 – 2024-01-26 (×4): 30 mg via ORAL
  Filled 2024-01-22 (×4): qty 1

## 2024-01-22 MED ORDER — HYDROMORPHONE HCL 2 MG PO TABS
2.0000 mg | ORAL_TABLET | ORAL | Status: DC | PRN
  Administered 2024-01-22 – 2024-01-26 (×13): 2 mg via ORAL
  Filled 2024-01-22 (×14): qty 1

## 2024-01-22 MED ORDER — SENNA 8.6 MG PO TABS
1.0000 | ORAL_TABLET | Freq: Every day | ORAL | Status: DC | PRN
  Administered 2024-01-23: 8.6 mg via ORAL
  Filled 2024-01-22: qty 1

## 2024-01-22 MED ORDER — OXYCODONE HCL 5 MG PO TABS: 5.0000 mg | ORAL_TABLET | ORAL | Status: DC | PRN

## 2024-01-22 MED ORDER — SODIUM CHLORIDE 0.9 % IV SOLN
1.0000 g | INTRAVENOUS | Status: DC
  Administered 2024-01-22: 1 g via INTRAVENOUS
  Filled 2024-01-22: qty 10

## 2024-01-22 MED ORDER — OXYCODONE-ACETAMINOPHEN 5-325 MG PO TABS
1.0000 | ORAL_TABLET | Freq: Once | ORAL | Status: AC
  Administered 2024-01-22: 1 via ORAL
  Filled 2024-01-22: qty 1

## 2024-01-22 MED ORDER — ALLOPURINOL 100 MG PO TABS: 100.0000 mg | ORAL_TABLET | Freq: Every morning | ORAL | Status: DC

## 2024-01-22 MED ORDER — OXYCODONE HCL 5 MG PO TABS
5.0000 mg | ORAL_TABLET | ORAL | Status: DC | PRN
  Administered 2024-01-22 – 2024-01-29 (×18): 5 mg via ORAL
  Filled 2024-01-22 (×19): qty 1

## 2024-01-22 MED ORDER — METHOCARBAMOL 500 MG PO TABS
500.0000 mg | ORAL_TABLET | Freq: Four times a day (QID) | ORAL | Status: DC | PRN
  Administered 2024-01-22 – 2024-01-29 (×15): 500 mg via ORAL
  Filled 2024-01-22 (×16): qty 1

## 2024-01-22 MED ORDER — METOPROLOL TARTRATE 25 MG PO TABS
25.0000 mg | ORAL_TABLET | Freq: Two times a day (BID) | ORAL | Status: DC
  Administered 2024-01-23 – 2024-01-30 (×14): 25 mg via ORAL
  Filled 2024-01-22 (×15): qty 1

## 2024-01-22 MED ORDER — VANCOMYCIN HCL 1250 MG/250ML IV SOLN
1250.0000 mg | INTRAVENOUS | Status: DC
  Administered 2024-01-24: 1250 mg via INTRAVENOUS
  Filled 2024-01-22: qty 250

## 2024-01-22 MED ORDER — TRIMETHOBENZAMIDE HCL 100 MG/ML IM SOLN
200.0000 mg | Freq: Four times a day (QID) | INTRAMUSCULAR | Status: DC | PRN
  Administered 2024-01-26 – 2024-01-27 (×2): 200 mg via INTRAMUSCULAR
  Filled 2024-01-22 (×4): qty 2
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
## 2024-01-22 NOTE — ED Provider Notes (Signed)
 [place] EMERGENCY DEPARTMENT AT [facility] Provider Note   CSN: [id number] Arrival date & time: 01/22/24  1143     Patient presents with: Knee Pain and Wound Infection   William Hatfield is a 78 y.o. 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  Patient was discharged from the hospital on Friday and started developing a fever, nausea, and vomiting over the weekend.  Patient states that he has been running fevers of 102F at home and his home blood pressure readings have been low. Patient states that his left lower extremity is hot, red, and inflamed.  Patient states that they were urged by orthopedic team to be seen in the emergency department.  Patient in no acute distress.    Knee Pain      Prior to Admission medications   Medication Sig Start Date End Date Taking? Authorizing Provider  acetaminophen  (TYLENOL ) 500 MG tablet Take 500-1,000 mg by mouth every 6 (six) hours as needed for fever or mild pain (pain score 1-3) (headaches, or discomfort).   Yes [provider]  allopurinol  (ZYLOPRIM ) 100 MG tablet Take 100 mg by mouth in the morning. 01/14/20  Yes [provider]  apixaban  (ELIQUIS ) 5 MG TABS tablet Take 1 tablet (5 mg total) by mouth 2 (two) times daily. 10/09/22  Yes Bethanie Cough, PA-C  atorvastatin  (LIPITOR) 40 MG tablet TAKE 1 TABLET EVERY EVENING Patient taking differently: Take 40 mg by mouth at bedtime. 01/15/24  Yes Debera [name] MATSU, MD  docusate sodium  (COLACE) 100 MG capsule Take 1 capsule (100 mg total) by mouth 2 (two) times daily. Patient taking differently: Take 100 mg by mouth in the morning and at bedtime. 01/18/24  Yes Bissell, Jaclyn M, PA-C  empagliflozin  (JARDIANCE ) 10 MG TABS tablet Take 10 mg by mouth in the morning. 07/30/23  Yes [provider]  furosemide  (LASIX ) 40 MG tablet TAKE 1 TABLET EVERY DAY Patient taking  differently: Take 40 mg by mouth in the morning. 05/16/23  Yes Debera [name] MATSU, MD  isosorbide  mononitrate (IMDUR ) 30 MG 24 hr tablet Take 1 tablet (30 mg total) by mouth daily. 11/13/23  Yes Debera [name] MATSU, MD  losartan  (COZAAR ) 50 MG tablet TAKE 1 TABLET EVERY DAY (DOSE INCREASED) Patient taking differently: Take 50 mg by mouth in the morning. 10/23/23  Yes Debera [name] MATSU, MD  methocarbamol  (ROBAXIN ) 500 MG tablet Take 1 tablet (500 mg total) by mouth every 6 (six) hours as needed for muscle spasms. Patient taking differently: Take 500 mg by mouth every 6 (six) hours. 01/18/24  Yes Bissell, Jaclyn M, PA-C  nitroGLYCERIN  (NITROSTAT ) 0.4 MG SL tablet DISSOLVE 1 TABLET UNDER THE TONGUE EVERY 5 MINUTES FOR 3 DOSES AS NEEDED FOR CHEST PAIN AS DIRECTED. Patient taking differently: Place 0.4 mg under the tongue every 5 (five) minutes x 3 doses as needed for chest pain. 11/14/23  Yes Debera [name] MATSU, MD  ondansetron  (ZOFRAN -ODT) 4 MG disintegrating tablet Take 1 tablet (4 mg total) by mouth every 8 (eight) hours as needed for nausea or vomiting. 01/30/24  Yes Odell Celinda Balo, MD  oxyCODONE  (OXY IR/ROXICODONE ) 5 MG immediate release tablet Take 1-2 tablets (5-10 mg total) by mouth every 4 (four) hours as needed for moderate pain (pain score 4-6) (pain score 4-6). Patient taking differently: Take 5-10 mg by mouth See admin instructions. Take 5-10 mg by mouth every 4-6 hours 01/18/24  Yes Bissell, Jaclyn M,  PA-C  polyethylene glycol (MIRALAX  / GLYCOLAX ) 17 g packet Take 17 g by mouth daily. 01/19/24  Yes Bissell, Jaclyn M, PA-C  metoprolol  tartrate (LOPRESSOR ) 25 MG tablet TAKE 1 TABLET TWICE DAILY Patient not taking: Reported on 01/22/2024 08/16/23   Debera [name] MATSU, MD  pantoprazole  (PROTONIX ) 40 MG tablet Take 1 tablet (40 mg total) by mouth 2 (two) times daily. 01/30/24 02/29/24  Odell Celinda Balo, MD  sulfamethoxazole -trimethoprim  (BACTRIM  DS) 800-160 MG tablet Take 1 tablet by mouth every  12 (twelve) hours for 7 days. 01/30/24 02/06/24  Odell Celinda Balo, MD    Allergies: Niaspan [niacin]    Review of Systems  Musculoskeletal:        L knee pain    Updated Vital Signs BP (!) 130/59 (BP Location: Right Arm)   Pulse 62   Temp 98 F (36.7 C) (Oral)   Resp 18   Ht 5' 11 (1.803 m)   Wt 85.8 kg   SpO2 99%   BMI 26.38 kg/m   Physical Exam Vitals and nursing note reviewed.  Constitutional:      General: He is not in acute distress.    Appearance: Normal appearance.  HENT:     Head: Normocephalic and atraumatic.  Eyes:     Extraocular Movements: Extraocular movements intact.     Conjunctiva/sclera: Conjunctivae normal.     Pupils: Pupils are equal, round, and reactive to light.  Cardiovascular:     Rate and Rhythm: Normal rate and regular rhythm.     Pulses: Normal pulses.          Dorsalis pedis pulses are 2+ on the right side and 2+ on the left side.  Pulmonary:     Effort: Pulmonary effort is normal. No respiratory distress.     Breath sounds: Normal breath sounds.  Abdominal:     General: Abdomen is flat.     Palpations: Abdomen is soft.     Tenderness: There is no abdominal tenderness.  Musculoskeletal:        General: Normal range of motion.     Cervical back: Normal range of motion.     Right lower leg: No edema.     Left lower leg: 2+ Edema present.     Comments: Diffusely swollen and erythematous left knee with postsurgical scar.  Incision closed-Staples intact.  Entire left leg below the knee is edematous compared to right leg.  Pulses intact.  Sensation intact.  Skin:    General: Skin is warm and dry.     Capillary Refill: Capillary refill takes less than 2 seconds.  Neurological:     General: No focal deficit present.     Mental Status: He is alert. Mental status is at baseline.  Psychiatric:        Mood and Affect: Mood normal.            (all labs ordered are listed, but only abnormal results are displayed) Labs Reviewed   CBC WITH DIFFERENTIAL/PLATELET - Abnormal; Notable for the following components:      Result Value   WBC 13.2 (*)    RBC 3.63 (*)    Hemoglobin 10.9 (*)    HCT 33.5 (*)    Neutro Abs 10.5 (*)    Monocytes Absolute 1.5 (*)    All other components within normal limits  COMPREHENSIVE METABOLIC PANEL WITH GFR - Abnormal; Notable for the following components:   Glucose, Bld 134 (*)    BUN 70 (*)  Creatinine, Ser 1.71 (*)    ALT 63 (*)    Alkaline Phosphatase 206 (*)    GFR, Estimated 41 (*)    All other components within normal limits  BASIC METABOLIC PANEL WITH GFR - Abnormal; Notable for the following components:   Potassium 5.3 (*)    Glucose, Bld 110 (*)    BUN 64 (*)    Creatinine, Ser 1.60 (*)    GFR, Estimated 44 (*)    All other components within normal limits  HEPATIC FUNCTION PANEL - Abnormal; Notable for the following components:   Albumin  3.2 (*)    AST 45 (*)    ALT 53 (*)    Alkaline Phosphatase 206 (*)    All other components within normal limits  CBC WITH DIFFERENTIAL/PLATELET - Abnormal; Notable for the following components:   WBC 12.7 (*)    RBC 3.41 (*)    Hemoglobin 10.2 (*)    HCT 32.4 (*)    Neutro Abs 9.5 (*)    Monocytes Absolute 1.7 (*)    All other components within normal limits  BASIC METABOLIC PANEL WITH GFR - Abnormal; Notable for the following components:   Glucose, Bld 117 (*)    BUN 52 (*)    Creatinine, Ser 1.30 (*)    GFR, Estimated 57 (*)    All other components within normal limits  CBC - Abnormal; Notable for the following components:   WBC 16.4 (*)    RBC 3.41 (*)    Hemoglobin 10.4 (*)    HCT 33.0 (*)    All other components within normal limits  D-DIMER, QUANTITATIVE - Abnormal; Notable for the following components:   D-Dimer, Quant 1.49 (*)    All other components within normal limits  APTT - Abnormal; Notable for the following components:   aPTT 42 (*)    All other components within normal limits  HEPARIN  LEVEL  (UNFRACTIONATED) - Abnormal; Notable for the following components:   Heparin  Unfractionated >1.10 (*)    All other components within normal limits  APTT - Abnormal; Notable for the following components:   aPTT 66 (*)    All other components within normal limits  HEPARIN  LEVEL (UNFRACTIONATED) - Abnormal; Notable for the following components:   Heparin  Unfractionated 1.10 (*)    All other components within normal limits  LIPID PANEL - Abnormal; Notable for the following components:   HDL 28 (*)    All other components within normal limits  BASIC METABOLIC PANEL WITH GFR - Abnormal; Notable for the following components:   Glucose, Bld 116 (*)    BUN 41 (*)    Calcium  8.8 (*)    All other components within normal limits  CBC - Abnormal; Notable for the following components:   WBC 14.7 (*)    RBC 3.41 (*)    Hemoglobin 10.2 (*)    HCT 32.4 (*)    All other components within normal limits  APTT - Abnormal; Notable for the following components:   aPTT 85 (*)    All other components within normal limits  BASIC METABOLIC PANEL WITH GFR - Abnormal; Notable for the following components:   Glucose, Bld 128 (*)    BUN 36 (*)    Calcium  8.5 (*)    All other components within normal limits  CBC - Abnormal; Notable for the following components:   WBC 13.8 (*)    RBC 3.14 (*)    Hemoglobin 9.4 (*)    HCT 29.6 (*)  All other components within normal limits  HEPARIN  LEVEL (UNFRACTIONATED) - Abnormal; Notable for the following components:   Heparin  Unfractionated 1.00 (*)    All other components within normal limits  APTT - Abnormal; Notable for the following components:   aPTT 86 (*)    All other components within normal limits  CBC - Abnormal; Notable for the following components:   WBC 15.7 (*)    RBC 3.59 (*)    Hemoglobin 10.7 (*)    HCT 34.2 (*)    Platelets 452 (*)    All other components within normal limits  BASIC METABOLIC PANEL WITH GFR - Abnormal; Notable for the  following components:   Potassium 5.3 (*)    CO2 21 (*)    Glucose, Bld 142 (*)    BUN 33 (*)    All other components within normal limits  BASIC METABOLIC PANEL WITH GFR - Abnormal; Notable for the following components:   CO2 21 (*)    Glucose, Bld 125 (*)    BUN 33 (*)    All other components within normal limits  BASIC METABOLIC PANEL WITH GFR - Abnormal; Notable for the following components:   Glucose, Bld 106 (*)    BUN 33 (*)    All other components within normal limits  CBC - Abnormal; Notable for the following components:   WBC 15.4 (*)    RBC 3.54 (*)    Hemoglobin 10.4 (*)    HCT 33.1 (*)    Platelets 451 (*)    All other components within normal limits  BASIC METABOLIC PANEL WITH GFR - Abnormal; Notable for the following components:   CO2 21 (*)    Glucose, Bld 114 (*)    BUN 45 (*)    Creatinine, Ser 1.31 (*)    GFR, Estimated 56 (*)    All other components within normal limits  CBC - Abnormal; Notable for the following components:   WBC 21.1 (*)    RBC 3.95 (*)    Hemoglobin 11.7 (*)    HCT 38.0 (*)    Platelets 470 (*)    All other components within normal limits  PRO BRAIN NATRIURETIC PEPTIDE - Abnormal; Notable for the following components:   Pro Brain Natriuretic Peptide 16,192.0 (*)    All other components within normal limits  URINALYSIS, ROUTINE W REFLEX MICROSCOPIC - Abnormal; Notable for the following components:   APPearance HAZY (*)    Hgb urine dipstick MODERATE (*)    Ketones, ur 5 (*)    Protein, ur 100 (*)    Bacteria, UA RARE (*)    All other components within normal limits  CBC - Abnormal; Notable for the following components:   WBC 17.6 (*)    RBC 3.52 (*)    Hemoglobin 10.5 (*)    HCT 32.6 (*)    Platelets 432 (*)    All other components within normal limits  BASIC METABOLIC PANEL WITH GFR - Abnormal; Notable for the following components:   CO2 21 (*)    Glucose, Bld 118 (*)    BUN 46 (*)    All other components within normal  limits  TROPONIN T, HIGH SENSITIVITY - Abnormal; Notable for the following components:   Troponin T High Sensitivity 348 (*)    All other components within normal limits  TROPONIN T, HIGH SENSITIVITY - Abnormal; Notable for the following components:   Troponin T High Sensitivity 350 (*)    All other components within  normal limits  CULTURE, BLOOD (ROUTINE X 2)  MRSA NEXT GEN BY PCR, NASAL  CULTURE, BLOOD (ROUTINE X 2)  CULTURE, BLOOD (ROUTINE X 2)  URINALYSIS, ROUTINE W REFLEX MICROSCOPIC  URIC ACID  LIPASE, BLOOD  I-STAT CG4 LACTIC ACID, ED    EKG: EKG Interpretation Date/Time:  Tuesday January 22 2024 11:46:23 EST Ventricular Rate:  89 PR Interval:  151 QRS Duration:  144 QT Interval:  434 QTC Calculation: 529 R Axis:   230  Text Interpretation: Ectopic atrial rhythm Significant baseline wander Confirmed by Cottie Cough [phone]) on 01/23/2024 8:15:36 AM  Radiology: [facility] CHEST PORT 1 VIEW Result Date: 01/29/2024 EXAM: 1 VIEW(S) XRAY OF THE CHEST 01/29/2024 08:01:00 AM COMPARISON: 01/25/2024 CLINICAL HISTORY: Pneumonia FINDINGS: LINES, TUBES AND DEVICES: Left chest cardiac pacing device. LUNGS AND PLEURA: Low lung volumes. Trace bilateral pleural effusions. Mild pulmonary edema with similar to mildly decreased patchy opacities in left mid to lower lung. No pneumothorax. HEART AND MEDIASTINUM: Post median sternotomy and CABG. Atherosclerotic calcifications. No acute abnormality of the cardiac and mediastinal silhouettes. BONES AND SOFT TISSUES: Post median sternotomy. No acute osseous abnormality. IMPRESSION: 1. Mild pulmonary edema with similar to mildly decreased patchy opacities in the left mid to lower lung, consistent with pneumonia. 2. Trace bilateral pleural effusions. Electronically signed by: Waddell Calk MD 01/29/2024 08:14 AM EST RP Workstation: [id number]     Procedures   Medications Ordered in the ED  0.9 %  sodium chloride  infusion ( Intravenous New Bag/Given  01/23/24 0810)  oxyCODONE -acetaminophen  (PERCOCET/ROXICET) 5-325 MG per tablet 1 tablet (1 tablet Oral Given 01/22/24 1415)  vancomycin  (VANCOCIN ) IVPB 1000 mg/200 mL premix (0 mg Intravenous Stopped 01/22/24 1840)  ceFAZolin  (ANCEF ) IVPB 1 g/50 mL premix (0 g Intravenous Stopped 01/22/24 1621)  HYDROmorphone  (DILAUDID ) injection 1 mg (1 mg Intravenous Given 01/22/24 2208)  heparin  bolus via infusion 1,500 Units (1,500 Units Intravenous Bolus from Bag 01/24/24 0746)  technetium albumin  aggregated (MAA) injection solution 4.15 millicurie (4.15 millicuries Intravenous Contrast Given 01/25/24 1108)  furosemide  (LASIX ) injection 40 mg (40 mg Intravenous Given 01/28/24 1226)                                 Medical Decision Making Amount and/or Complexity of Data Reviewed Labs: ordered. Radiology: ordered.  Risk Prescription drug management.  Patient presents to the ED for: Left knee pain This involves an extensive number of treatment options Differential diagnosis includes: Postsurgical healing process Infectious etiology Other, traumatic etiology Co-morbid conditions: Atrial fibrillation, coronary artery disease, CKD, chronic anticoagulation  Additional history/records obtained and reviewed: Additional history obtained from  family External records from outside source obtained and reviewed including daughter who is present for visit was a good historian on postsurgical events and care.  Clinical Course as of 02/04/24 0847  Tue Jan 22, 2024  1145 EKG 12-Lead Atrial paced rhythm  [ML]  1150 Temp: 99.1 F (37.3 C) Afebrile, vital stable, patient in no acute distress. [ML]  1430 Patient given oxycodone  for pain management-well-tolerated [ML]  1500 Comprehensive metabolic panel(!) Mildly elevated creatinine, alk phos [ML]  1500 CBC with Differential(!) Leukocytosis 13.2 [ML]  1500 Urinalysis, Routine w reflex microscopic -Urine, Clean Catch WNL [ML]  1600 Patient's orthopedic  surgeon was contacted, patient was discussed, it was ultimately decided that patient to be admitted for further evaluation [ML]  1630 I-Stat Lactic Acid, ED WNL [ML]  1630 Antibiotics administered -well-tolerated [ML]  1630  DG Knee Complete 4 Views Left Subcutaneous gas noted in the joint space-unable to distinguish at this time if this is postsurgical or infectious related [ML]  1700 Spoke with hospitalist who agreed with admission [ML]    Clinical Course User Index [ML] Willma Duwaine CROME, PA    Data Reviewed / Actions Taken: Labs ordered/reviewed with my independent interpretation in ED course above. Imaging ordered/reviewed with my independent interpretation in ED course above. I agree with the radiologists interpretation.  EKG ordered/reviewed with my independent interpretation in ED course above. The patient was kept on continuous cardiac monitoring during the ED stay. Key findings for the patient were reviewed with the attending physician, and ongoing clinical collaboration was maintained throughout the visit  Management / Treatments: See ED course above for medications, treatments administered, and clinical rationale.   Reevaluation of the patient after these medicines showed that the patient improved with pain management. I have reviewed the patients home medicines and have made adjustments as needed  ED Course / Reassessments: Problem List: Left knee pain 78 year old male presented for left knee pain. Initial assessment included history, physical exam, and review of prior medical records. Patients physical exam included revealed a diffusely swollen and erythematous left knee. Laboratory studies and imaging were indicative of possible postsurgical infection. Pain and symptoms were addressed during the visit. Vital signs were obtained and monitored, and the patient remained stable throughout the stay.  Given physical exam findings, laboratory studies, and multiple conversations with  patient's orthopedic surgeon Dr. Duwayne it was ultimately decided that patient was to be admitted for further evaluation and care of postsurgical complications Patient response: improved with pain management Serial reassessments performed: Yes    Consultations:  Orthopedic  surgery - Dr. Duwayne Consult recommendations incorporated into plan: Patient to be admitted for further evaluation and care of possible postsurgical complications. Hospitalist -  Hospitalist agreed with admission which was incorporated into patient care plan.  Disposition: Disposition: Admission for further evaluation and care Rationale for disposition: Postsurgical complications The disposition plan and rationale were discussed with the patient at the bedside, all questions were addressed, and the patient demonstrated understanding.  This note was produced using Electronics Engineer. While I have reviewed and verified all clinical information, transcription errors may remain.      Final diagnoses:  None          Willma Duwaine CROME, [place] 02/04/24 0900

## 2024-01-22 NOTE — ED Triage Notes (Signed)
 Pt arrives via wheelchair with complaints of poor PO intake, weakness, LLE edema, and concern for post op infection of the LEFT knee. Concern for possible sepsis.   Dr. Duwayne is pts surgeon.

## 2024-01-22 NOTE — Progress Notes (Signed)
 Pharmacy Antibiotic Note  William Hatfield is a 78 y.o. male admitted on 01/22/2024 with superficial cellulitis. 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 Pharmacy has been consulted for vancomycin  dosing x7d. One time dose of vancomycin  and cefazolin  given in the ED. -Noted elevated Scr (1.71), hx CKD, BL appears to be ?1.3-1.5; mildly febrile, WBC elevated   Plan: Vancomycin  1250mg  IV q36h (eAUC 479, Scr 1.71, TBW) x7d Monitor renal function, fever curve, and clinical progress Follow up cultures      Temp (24hrs), Avg:98.5 F (36.9 C), Min:98.1 F (36.7 C), Max:99.1 F (37.3 C)  Recent Labs  Lab 01/17/24 0322 01/17/24 0345 01/22/24 1405 01/22/24 1618  WBC 14.8*  --  13.2*  --   CREATININE  --  1.34* 1.71*  --   LATICACIDVEN  --   --   --  1.7    Estimated Creatinine Clearance: 39.1 mL/min (A) (by C-G formula based on SCr of 1.71 mg/dL (H)).    Allergies  Allergen Reactions   Niaspan [Niacin] Hives and Itching    NIASPAN ONLY    Antimicrobials this admission: Vancomycin  11/25 >> Ceftriaxone  11/25 >>   Dose adjustments this admission: none  Microbiology results: 11/25 BCx: sent   Thank you for allowing pharmacy to be a part of this patient's care.  [name] 01/22/2024 5:09 PM
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
## 2024-01-22 NOTE — Plan of Care (Signed)

## 2024-01-23 ENCOUNTER — Inpatient Hospital Stay (HOSPITAL_COMMUNITY)

## 2024-01-23 DIAGNOSIS — R52 Pain, unspecified: Secondary | ICD-10-CM | POA: Diagnosis not present

## 2024-01-23 DIAGNOSIS — M7989 Other specified soft tissue disorders: Secondary | ICD-10-CM | POA: Diagnosis not present

## 2024-01-23 DIAGNOSIS — L03116 Cellulitis of left lower limb: Secondary | ICD-10-CM | POA: Diagnosis not present

## 2024-01-23 DIAGNOSIS — L538 Other specified erythematous conditions: Secondary | ICD-10-CM | POA: Diagnosis not present

## 2024-01-23 LAB — HEPATIC FUNCTION PANEL
ALT: 53 U/L — ABNORMAL HIGH (ref 0–44)
AST: 45 U/L — ABNORMAL HIGH (ref 15–41)
Albumin: 3.2 g/dL — ABNORMAL LOW (ref 3.5–5.0)
Alkaline Phosphatase: 206 U/L — ABNORMAL HIGH (ref 38–126)
Bilirubin, Direct: 0.2 mg/dL (ref 0.0–0.2)
Indirect Bilirubin: 0.5 mg/dL (ref 0.3–0.9)
Total Bilirubin: 0.7 mg/dL (ref 0.0–1.2)
Total Protein: 6.5 g/dL (ref 6.5–8.1)

## 2024-01-23 LAB — CBC WITH DIFFERENTIAL/PLATELET
Abs Immature Granulocytes: 0.05 K/uL (ref 0.00–0.07)
Basophils Absolute: 0 K/uL (ref 0.0–0.1)
Basophils Relative: 0 %
Eosinophils Absolute: 0.1 K/uL (ref 0.0–0.5)
Eosinophils Relative: 1 %
HCT: 32.4 % — ABNORMAL LOW (ref 39.0–52.0)
Hemoglobin: 10.2 g/dL — ABNORMAL LOW (ref 13.0–17.0)
Immature Granulocytes: 0 %
Lymphocytes Relative: 11 %
Lymphs Abs: 1.4 K/uL (ref 0.7–4.0)
MCH: 29.9 pg (ref 26.0–34.0)
MCHC: 31.5 g/dL (ref 30.0–36.0)
MCV: 95 fL (ref 80.0–100.0)
Monocytes Absolute: 1.7 K/uL — ABNORMAL HIGH (ref 0.1–1.0)
Monocytes Relative: 13 %
Neutro Abs: 9.5 K/uL — ABNORMAL HIGH (ref 1.7–7.7)
Neutrophils Relative %: 75 %
Platelets: 285 K/uL (ref 150–400)
RBC: 3.41 MIL/uL — ABNORMAL LOW (ref 4.22–5.81)
RDW: 14.9 % (ref 11.5–15.5)
WBC: 12.7 K/uL — ABNORMAL HIGH (ref 4.0–10.5)
nRBC: 0 % (ref 0.0–0.2)

## 2024-01-23 LAB — BASIC METABOLIC PANEL WITH GFR
Anion gap: 11 (ref 5–15)
BUN: 64 mg/dL — ABNORMAL HIGH (ref 8–23)
CO2: 24 mmol/L (ref 22–32)
Calcium: 9.1 mg/dL (ref 8.9–10.3)
Chloride: 102 mmol/L (ref 98–111)
Creatinine, Ser: 1.6 mg/dL — ABNORMAL HIGH (ref 0.61–1.24)
GFR, Estimated: 44 mL/min — ABNORMAL LOW (ref >60)
Glucose, Bld: 110 mg/dL — ABNORMAL HIGH (ref 70–99)
Potassium: 5.3 mmol/L — ABNORMAL HIGH (ref 3.5–5.1)
Sodium: 137 mmol/L (ref 135–145)

## 2024-01-23 LAB — TROPONIN T, HIGH SENSITIVITY
Troponin T High Sensitivity: 348 ng/L (ref 0–19)
Troponin T High Sensitivity: 350 ng/L (ref 0–19)

## 2024-01-23 LAB — D-DIMER, QUANTITATIVE: D-Dimer, Quant: 1.49 ug{FEU}/mL — ABNORMAL HIGH (ref 0.00–0.50)

## 2024-01-23 MED ORDER — SODIUM CHLORIDE 0.9 % IV SOLN
2.0000 g | Freq: Two times a day (BID) | INTRAVENOUS | Status: DC
  Administered 2024-01-23 – 2024-01-29 (×14): 2 g via INTRAVENOUS
  Filled 2024-01-23 (×14): qty 12.5

## 2024-01-23 MED ORDER — HEPARIN (PORCINE) 25000 UT/250ML-% IV SOLN
1450.0000 [IU]/h | INTRAVENOUS | Status: DC
  Administered 2024-01-23: 1200 [IU]/h via INTRAVENOUS
  Administered 2024-01-24 – 2024-01-26 (×3): 1450 [IU]/h via INTRAVENOUS
  Filled 2024-01-23 (×4): qty 250

## 2024-01-23 MED ORDER — ALLOPURINOL 100 MG PO TABS
100.0000 mg | ORAL_TABLET | Freq: Every day | ORAL | Status: DC
  Administered 2024-01-23 – 2024-01-29 (×7): 100 mg via ORAL
  Filled 2024-01-23 (×8): qty 1

## 2024-01-23 MED ORDER — LACTULOSE 10 GM/15ML PO SOLN
20.0000 g | Freq: Two times a day (BID) | ORAL | Status: DC
  Administered 2024-01-23 – 2024-01-26 (×4): 20 g via ORAL
  Filled 2024-01-23 (×8): qty 30

## 2024-01-23 NOTE — Progress Notes (Signed)
 Pt had a large BM after enema administration. Pt tolerated well and stated feeling relief. PRN pain med effective for pain in the left knee. Continue to elevate left leg above the heart level and ice compression. Pt resting with call bell in reach.

## 2024-01-23 NOTE — Progress Notes (Signed)
     Patient Name: William Hatfield           DOB: [birth year]  MRN: [id number]      Admission Date: 01/22/2024  Attending Provider: Drew Ellouise SQUIBB, MD  Primary Diagnosis: Left leg cellulitis   Level of care: Med-Surg   OVERNIGHT EVENT   HPI/ Events of Note  William Hatfield, 78 y.o. male, was admitted on 01/22/2024 for Left leg cellulitis. He has a significant medical history of hypertension, CAD status post CABG, atrial fibrillation on Eliquis , CKD 3A,  bilateral carotid stent placement.   Bedside evaluation requested by Dr. Ellouise due to elevated troponin and D-dimer.  ## Elevated troponin and D-dimer Earlier today, patient complained of increased shortness of breath followed by chest discomfort.   Troponin max 350, EKG interpreted as atrial paced without acute ST segment elevation.  Patient denies chest pain at rest. Prior chest x-ray on admission raise question for bronchopneumonia versus atelectasis.  Currently on 1-2 L [place].  Currently on empiric antibiotics that would also cover pneumonia.  Continue incentive spirometry use.  CT chest without contrast ordered by attending. Will need to rule out PE.  Given history of CKD, order placed for VQ scan.  Venous duplex negative for lower extremity DVT.   Bedside Assessment:  Patient is awake, A/O x4, with no associated distress.  Hemodynamically stable --> Blood pressure 135/66, pulse 60, temperature 98.6 F (37 C), temperature source Oral, resp. rate 19, height 5' 11 (1.803 m), weight 84.8 kg, SpO2 98%.  Endorses lightheadedness as well as SOB with exertion. Denies current chest pain, palpitations, nausea, vomiting, abdominal discomfort.  Respiratory: Bilaterally clear, no wheezing, no crackles. Normal effort. No accessory muscle use.  Cardiovascular: Regular rate, regular rhythm.  S1 and S2 Abdomen: Abdomen is soft and nontender.  Positive bowel sounds in all quadrants.    Plan: Consult cardiology VQ scan   Addendum: Spoke with  cardiologist on-call, who recommended switching patient from Eliquis  to heparin  gtt. while we continue workup to rule out PE.  Cardiology will follow in the morning   Joziyah Roblero, DNP, ACNPC- AG [facility] [place]

## 2024-01-23 NOTE — Progress Notes (Signed)
 Subjective:    Patient reports pain as 4 on 0-10 scale.   Denies CP or SOB.  Voiding without difficulty. Positive flatus. Objective: Vital signs in last 24 hours: Temp:  [97.7 F (36.5 C)-99.1 F (37.3 C)] 97.7 F (36.5 C) (11/26 0926) Pulse Rate:  [55-103] 59 (11/26 0926) Resp:  [16-18] 18 (11/26 0926) BP: (107-170)/(50-93) 129/86 (11/26 0926) SpO2:  [95 %-100 %] 99 % (11/26 0926) Weight:  [84.8 kg] 84.8 kg (11/25 1703)  Intake/Output from previous day: 11/25 0701 - 11/26 0700 In: 1702.7 [P.O.:520; I.V.:832.7; IV Piggyback:350] Out: 890 [Urine:890] Intake/Output this shift: Total I/O In: 120 [P.O.:120] Out: 250 [Urine:250]  Recent Labs    01/22/24 1405 01/23/24 0323  HGB 10.9* 10.2*   Recent Labs    01/22/24 1405 01/23/24 0323  WBC 13.2* 12.7*  RBC 3.63* 3.41*  HCT 33.5* 32.4*  PLT 317 285   Recent Labs    01/22/24 1405 01/23/24 0323  NA 137 137  K 5.1 5.3*  CL 98 102  CO2 24 24  BUN 70* 64*  CREATININE 1.71* 1.60*  GLUCOSE 134* 110*  CALCIUM  9.7 9.1   No results for input(s): LABPT, INR in the last 72 hours.  Neurologically intact ABD soft Neurovascular intact Sensation intact distally Intact pulses distally Dorsiflexion/Plantar flexion intact Incision: dressing C/D/I Compartment soft  Assessment/Plan:     Advance diet Up with therapy   Principal Problem:   Left leg cellulitis Active Problems:   Hypertension   CAD (coronary artery disease)   PAF (paroxysmal atrial fibrillation) (HCC)   Acute renal failure superimposed on stage 3a chronic kidney disease (HCC) Doppler preliminarily negative for DVT.  The swelling in his leg is dramatically better with just elevation above the heart and an Ace bandage.  Suggesting at home elevation and adequate.  The demarcated area of erythema is unchanged and consistent with normal postoperative total knee.  Again I do not feel this represents an infection taken on a consideration in the  timing.  With his sitting and in bed suboptimal lung aeration is certainly a concern.  May need to repeat his chest x-ray to determine whether there is any progression of his consolidation of the left lung.  Will add physical therapy.  Unable to use an anti-inflammatory due to chronic kidney disease as well as anticoagulation.  Also management for constipation.  Social services consult for a potential rehab facility which may be beneficial.  Patient's reported pain is greater than of that when examined and seen.  The leg was rewrapped with an Ace.  And was able to near fully extend him.  Pending consultation with the hospitalist.    Reyes [name] Billing 01/23/2024, @NOW 

## 2024-01-23 NOTE — Progress Notes (Addendum)
 Progress Note   Patient: William Hatfield FMW:[id number] DOB: October 23, 1945 DOA: 01/22/2024     1 DOS: the patient was seen and examined on 01/23/2024   Brief hospital course: William Hatfield is a 78 y.o. 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   Patient began feeling generally poor with nausea, fever, chills, and fatigue 2 or 3 days ago.  His daughter noted today that the left knee appeared red and swollen.  Patient has resumed full dose Eliquis  since the procedure, denies any change in his chronic cough, and aside from the left knee, does not have any red, hot, or swollen joints.  Patient is currently on ceftriaxone  and vancomycin .  Patient reports improvement in pain from 9 out of 10 to about 6 out of 10.  Still has erythema.  Leukocytosis mildly improved.  Broaden patient's antibiotic from ceftriaxone  to cefepime  and continued vancomycin .  Assessment and Plan: 1. Left leg cellulitis  - Presents with fever, nausea, general malaise, and erythema about the left knee surgical incision  - Appreciate orthopedic surgery consultation, they have evaluated and at this time feel that the swelling is significantly improved and the erythema is about baseline for postoperative period. - Patient was put on ceftriaxone  vancomycin  broadened to cefepime . - Checking duplex of the venous system to rule out DVT of the lower extremity. - Continue pain control and monitor cultures.   - Blood Cultures so far no growth.     2. AKI superimposed on CKD 3A  - SCr is 1.71, up from apparent baseline of ~1.3  - Hold losartan , Lasix , and Jardiance , renally-dose medications, start gentle IVF hydration, and repeat chem panel in am   -Creatinine is improving.  Creatinine today is 1.6   3. ?Pneumonia  -  CXR findings raise question of bronchopneumonia vs atelectasis  - RR and O2 saturation are normal on room air, patient denies any recent change in his chronic cough, and lungs are clear to auscultation  - Incentive spirometry/pulmonary toilet, follow clinically  -Patient is already on broad-spectrum antibiotic coverage which should cover also the pneumonia.  Respiratory status continues to be stable   4. PAF  - Eliquis , metoprolol     5. CAD  - S/p CABG  - No anginal complaints  - Continue Lipitor, Imdur , metoprolol           Subjective: Patient was seen and examined at bedside today.  Reports improvement in knee pain denies having any fevers or chills today.  Physical Exam: Vitals:   01/23/24 0112 01/23/24 0635 01/23/24 0809 01/23/24 0926  BP: (!) 155/93 107/79 132/67 129/86  Pulse: (!) 103 60 70 (!) 59  Resp: 18 18 18 18   Temp: 98.2 F (36.8 C) 98.2 F (36.8 C) 98.2 F (36.8 C) 97.7 F (36.5 C)  TempSrc: Oral Oral Oral Oral  SpO2: 97% 100%  99%  Weight:      Height:       Constitutional: NAD, no pallor or diaphoresis   Eyes: PERTLA, lids and conjunctivae normal ENMT: Mucous membranes are moist. Posterior pharynx clear of any exudate or lesions.   Neck: supple, no masses  Respiratory: no wheezing, no crackles. No accessory muscle use.  Cardiovascular: S1 & S2 heard, regular rate and rhythm. No JVD. Abdomen: No tenderness, soft. Bowel sounds active.  Musculoskeletal: no clubbing /  cyanosis. Erythema and edema about the left knee; incision well approximated.   Skin: no significant rashes, lesions, or ulcers aside from left knee findings. Warm, dry, well-perfused. Neurologic: CN 2-12 grossly intact. Moving all extremities. Alert and oriented.  Psychiatric Calm. Cooperative.  Data Reviewed:  CBC CMP  Family Communication: Is alert and oriented  Disposition: Status is: Inpatient Remains inpatient appropriate because: Cellulitis left knee  Planned Discharge  Destination: Home with Home Health    Time spent: 55 minutes  Author: Ellouise SHAUNNA Ra, MD 01/23/2024 12:32 PM  For on call review [email or web address].

## 2024-01-23 NOTE — Progress Notes (Signed)
 PHARMACY - ANTICOAGULATION CONSULT NOTE  Pharmacy Consult for IV heparin  Indication: Afib; r/o new PE  Allergies  Allergen Reactions   Niaspan [Niacin] Hives, Itching and Other (See Comments)    [facility] ONLY    Patient Measurements: Height: 5' 11 (180.3 cm) Weight: 84.8 kg (186 lb 15.2 oz) IBW/kg (Calculated) : 75.3 HEPARIN  DW (KG): 84.8  Vital Signs: Temp: 98.2 F (36.8 C) (11/26 1441) Temp Source: Oral (11/26 1441) BP: 127/65 (11/26 1441) Pulse Rate: 60 (11/26 1441)  Labs: Recent Labs    01/22/24 1405 01/23/24 0323  HGB 10.9* 10.2*  HCT 33.5* 32.4*  PLT 317 285  CREATININE 1.71* 1.60*    Estimated Creatinine Clearance: 41.8 mL/min (A) (by C-G formula based on SCr of 1.6 mg/dL (H)).   Medical History: Past Medical History:  Diagnosis Date   Anginal pain    Arthritis    Atrial fibrillation (HCC)    a. post op in 2006  b. recurrence on 08/2016 admission for NSTEMI   CAD (coronary artery disease)    a. 2006: s/p CABG at [facility] with LIMA to LAD, SVG to PDA, SVG to OM1, SVG to D1  b. 08/2016: NSTEMI 09/18/16 which showed severe native 3V CAD with patent SVG-->PLA, LIMA-->LAD and total occlusion of SVG--> OM1 with unsuccessful attempt at PCI of SVG--> OM with inability to restore flow.    Cancer (HCC)    Rt. arm,head squamus.   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   Essential hypertension    Myocardial infarction (HCC) 09/18/2016   Peripheral vascular disease    Pneumonia 2020   COVID PNA   Presence of permanent cardiac pacemaker    Tremor     Medications:  Medications Prior to Admission  Medication Sig Dispense Refill Last Dose/Taking   acetaminophen  (TYLENOL ) 500 MG tablet Take 500-1,000 mg by mouth every 6 (six) hours as needed for fever or mild pain (pain score 1-3) (headaches, or discomfort).   01/22/2024 Morning   allopurinol  (ZYLOPRIM ) 100 MG tablet Take 100  mg by mouth in the morning.   01/22/2024 Morning   apixaban  (ELIQUIS ) 5 MG TABS tablet Take 1 tablet (5 mg total) by mouth 2 (two) times daily. 60 tablet 6 01/22/2024 at  8:00 AM   atorvastatin  (LIPITOR) 40 MG tablet TAKE 1 TABLET EVERY EVENING (Patient taking differently: Take 40 mg by mouth at bedtime.) 90 tablet 3 01/21/2024 Bedtime   Coenzyme Q10 (COQ-10) 100 MG CAPS Take 100 mg by mouth daily in the afternoon.   01/21/2024 Noon   cyanocobalamin  500 MCG tablet Take 500 mcg by mouth in the morning.   01/22/2024 Morning   docusate sodium  (COLACE) 100 MG capsule Take 1 capsule (100 mg total) by mouth 2 (two) times daily. (Patient taking differently: Take 100 mg by mouth in the morning and at bedtime.) 30 capsule 0 01/22/2024 Morning   empagliflozin  (JARDIANCE ) 10 MG TABS tablet Take 10 mg by mouth in the morning.   01/22/2024 Morning   furosemide  (LASIX ) 40 MG tablet TAKE 1 TABLET EVERY DAY (Patient taking differently: Take 40 mg by mouth in the morning.) 90 tablet 3 01/22/2024 Morning   isosorbide  mononitrate (IMDUR ) 30 MG 24 hr tablet Take 1 tablet (30 mg total) by mouth daily. 90 tablet 3 01/22/2024 Morning   losartan  (COZAAR ) 50 MG tablet TAKE 1 TABLET EVERY DAY (  DOSE INCREASED) (Patient taking differently: Take 50 mg by mouth in the morning.) 90 tablet 1 01/22/2024 Morning   methocarbamol  (ROBAXIN ) 500 MG tablet Take 1 tablet (500 mg total) by mouth every 6 (six) hours as needed for muscle spasms. (Patient taking differently: Take 500 mg by mouth every 6 (six) hours.) 40 tablet 1 01/21/2024 Bedtime   Multiple Vitamin (MULTIVITAMIN) tablet Take 1 tablet by mouth daily with breakfast.   01/22/2024 Morning   nitroGLYCERIN  (NITROSTAT ) 0.4 MG SL tablet DISSOLVE 1 TABLET UNDER THE TONGUE EVERY 5 MINUTES FOR 3 DOSES AS NEEDED FOR CHEST PAIN AS DIRECTED. (Patient taking differently: Place 0.4 mg under the tongue every 5 (five) minutes x 3 doses as needed for chest pain.) 25 tablet 11 01/19/2024    oxyCODONE  (OXY IR/ROXICODONE ) 5 MG immediate release tablet Take 1-2 tablets (5-10 mg total) by mouth every 4 (four) hours as needed for moderate pain (pain score 4-6) (pain score 4-6). (Patient taking differently: Take 5-10 mg by mouth See admin instructions. Take 5-10 mg by mouth every 4-6 hours) 40 tablet 0 01/22/2024 Morning   polyethylene glycol (MIRALAX  / GLYCOLAX ) 17 g packet Take 17 g by mouth daily. 14 each 0 01/22/2024 Morning   metoprolol  tartrate (LOPRESSOR ) 25 MG tablet TAKE 1 TABLET TWICE DAILY (Patient not taking: Reported on 01/22/2024) 180 tablet 3 Not Taking   Scheduled:   allopurinol   100 mg Oral Q0600   atorvastatin   40 mg Oral QPM   isosorbide  mononitrate  30 mg Oral Daily   lactulose   20 g Oral BID   metoprolol  tartrate  25 mg Oral BID   sodium chloride  flush  3 mL Intravenous Q12H   PRN: acetaminophen  **OR** acetaminophen , HYDROmorphone , methocarbamol , oxyCODONE , senna, trimethobenzamide   Assessment: 86 yoM with PMH Afib on Eliquis  PTA, CKD3, s/p left TKA 11/19, admitted 11/25 with concern for post-surgical infection. Endorses fevers, n/v, and subjective hypotension. Limited doppler negative for DVT, but concern remains for PE. V/Q pending d/t AKI; Pharmacy consulted to dose IV heparin  pending VQ results.  Baseline INR, aPTT: not done Prior anticoagulation: Eliquis  5 mg bid, last dose 11/26 at 0849  Significant events:  Today, 01/23/2024: CBC: Hgb slightly low but stable; Plt stable WNL SCr elevated on admission (baseline 1.3); trending down No bleeding or infusion issues per nursing  Goal of Therapy: Heparin  level 0.3-0.7 units/ml Monitor platelets by anticoagulation protocol: Yes  Plan: At 2100 (~12 hr after last Eliquis  dose), begin heparin  1200 units/hr IV infusion w/o bolus Check aPTT and baseline heparin  level 8 hrs after start Daily CBC and heparin  level; aPTT as needed until Eliquis  effects dissipate Monitor for signs of bleeding or thrombosis F/u VQ  results  Bard Jeans, PharmD, BCPS [phone] 01/23/2024, 8:54 PM

## 2024-01-23 NOTE — Progress Notes (Signed)
 LLE venous duplex has been completed.   Results can be found under chart review under CV PROC. 01/23/2024 11:08 AM Devion Chriscoe RVT, RDMS

## 2024-01-23 NOTE — Progress Notes (Signed)
 Uric acid level is 8.4.  That is typically elevated from the normal range.  Will discuss with hospitalist as to whether gouty arthropathy could be contributing to the patient's elevated pain.  He may benefit by the addition of colchicine.  Again the degree of erythema around his incision is typical for postop.  He has no lymphadenopathy.  His white count is less than it was after surgery.  Lactic acid is normal.  And he has potentially atelectasis or early bronchopneumonia in his lung.  Continue with empiric antibiotics appreciate the hospitalist input.  And will discuss with them later this morning.  Doppler pending.

## 2024-01-23 NOTE — Progress Notes (Signed)
 Subjective:    Patient reports pain as 5 on 0-10 scale.   Denies CP or SOB.  Voiding without difficulty. Positive flatus. Objective: Vital signs in last 24 hours: Temp:  [98.1 F (36.7 C)-99.1 F (37.3 C)] 98.2 F (36.8 C) (11/26 0635) Pulse Rate:  [55-103] 60 (11/26 0635) Resp:  [16-18] 18 (11/26 0635) BP: (107-170)/(50-93) 107/79 (11/26 0635) SpO2:  [95 %-100 %] 100 % (11/26 0635) Weight:  [84.8 kg] 84.8 kg (11/25 1703)  Intake/Output from previous day: 11/25 0701 - 11/26 0700 In: 1702.7 [P.O.:520; I.V.:832.7; IV Piggyback:350] Out: 890 [Urine:890] Intake/Output this shift: No intake/output data recorded.  Recent Labs    01/22/24 1405 01/23/24 0323  HGB 10.9* 10.2*   Recent Labs    01/22/24 1405 01/23/24 0323  WBC 13.2* 12.7*  RBC 3.63* 3.41*  HCT 33.5* 32.4*  PLT 317 285   Recent Labs    01/22/24 1405 01/23/24 0323  NA 137 137  K 5.1 5.3*  CL 98 102  CO2 24 24  BUN 70* 64*  CREATININE 1.71* 1.60*  GLUCOSE 134* 110*  CALCIUM  9.7 9.1   No results for input(s): LABPT, INR in the last 72 hours.  Neurologically intact ABD soft Neurovascular intact Intact pulses distally No cellulitis present Compartment soft Leg with minimal swelling  Assessment/Plan:     Up with therapy   Principal Problem:   Left leg cellulitis Active Problems:   Hypertension   CAD (coronary artery disease)   PAF (paroxysmal atrial fibrillation) (HCC)   Acute renal failure superimposed on stage 3a chronic kidney disease (HCC)   Do not feel this represents a knee infection. Spoke with Med. Lung atelectasis Swelling better with adequate elevation Social services for possible rehab.   William Hatfield Billing 01/23/2024, @[facility] 

## 2024-01-23 NOTE — Evaluation (Signed)
 Physical Therapy Evaluation Patient Details Name: William Hatfield MRN: [id number] DOB: 1946/01/24 Today's Date: 01/23/2024  History of Present Illness  78 y.o. male presenting with fever, nausea, fatigue, and left leg redness and swelling. dopplers negative. CXR findings raise question of bronchopneumonia vs atelectasis.  PMH:  hypertension, CAD status post CABG, atrial fibrillation on Eliquis , CKD 3A, osteoarthritis status post L TKA on 01/16/2024, Parkinson's (per pt)  Clinical Impression  Pt admitted with above diagnosis.  Pt was independent prior to recent TKA, has been amb with RW since surgery, significant functional decline just prior to this admission. Has 2 dtrs that assist in his  care, one is an [employer].  Pt agreeable to PT today, reviewed ROM/exercises L knee and sat EOB. Pt tol well. Pt dizzy sitting EOB. Unable to attempt standing. unable to get BP reading in sitting and returned to supine with BP 115/60. HR 70, SpO2= 99% on RA, O2 replaced at 1L. Pt also with c/o chest tightness RN notified.   Will continue to follow in acute setting. Will update d/c recommendations if higher level of care is indicated post acute.   Pt currently with functional limitations due to the deficits listed below (see PT Problem List). Pt will benefit from acute skilled PT to increase their independence and safety with mobility to allow discharge.           If plan is discharge home, recommend the following: A little help with walking and/or transfers;A little help with bathing/dressing/bathroom;Assistance with cooking/housework;Assist for transportation;Help with stairs or ramp for entrance   Can travel by private vehicle        Equipment Recommendations None recommended by PT  Recommendations for Other Services       Functional Status Assessment Patient has had a recent decline in their functional status and demonstrates the ability to make significant improvements in function in a reasonable and  predictable amount of time.     Precautions / Restrictions Precautions Precautions: Knee;Fall Recall of Precautions/Restrictions: Intact Precaution/Restrictions Comments: LLE elevated on pillows on PT arrival.  Reviewed importance of terminal knee extension, elevation in extension Restrictions Weight Bearing Restrictions Per Provider Order: No      Mobility  Bed Mobility Overal bed mobility: Needs Assistance Bed Mobility: Supine to Sit, Sit to Supine     Supine to sit: Min assist Sit to supine: Min assist, Mod assist   General bed mobility comments: assist to guide LLE off bed; assist for trunk position and lifting bil LEs on return to supine. pt dizzy sitting EOB, unable to get BP reading, returned to supine with BP 115/60.  HR 70, SpO2= 99% on RA, O2 replaced at 1L    Transfers                   General transfer comment: unable to stand d/t dizziness    Ambulation/Gait                  Stairs            Wheelchair Mobility     Tilt Bed    Modified Rankin (Stroke Patients Only)       Balance Overall balance assessment: Needs assistance Sitting-balance support: No upper extremity supported, Feet supported Sitting balance-Leahy Scale: Fair         Standing balance comment: unable  Pertinent Vitals/Pain Pain Assessment Pain Assessment: 0-10 Pain Score: 5  Pain Location: L knee Pain Descriptors / Indicators: Sore Pain Intervention(s): Limited activity within patient's tolerance, Monitored during session, Premedicated before session, Ice applied    Home Living Family/patient expects to be discharged to:: Private residence Living Arrangements: Alone Available Help at Discharge: Family;Available 24 hours/day Type of Home: House Home Access: Level entry       Home Layout: One level Home Equipment: Rollator (4 wheels);Cane - single Librarian, Academic (2 wheels) Additional Comments: 2  daughters to assist, one is [employer]    Prior Function Prior Level of Function : Independent/Modified Independent;Driving             Mobility Comments: walked without AD, no falls in 6 months--prior to TKA, has been amb with RW since knee surgery ADLs Comments: ind prior to TKA     Extremity/Trunk Assessment   Upper Extremity Assessment Upper Extremity Assessment: Overall WFL for tasks assessed (UE tremors noted)    Lower Extremity Assessment LLE Deficits / Details: ankle WFL, knee extension 2+/5,  hip flexion 2+/5, ~10 degree quad lag. MMT limited by pain    Cervical / Trunk Assessment Cervical / Trunk Assessment: Normal  Communication        Cognition Arousal: Alert Behavior During Therapy: WFL for tasks assessed/performed   PT - Cognitive impairments: No apparent impairments                                 Cueing       General Comments      Exercises Total Joint Exercises Ankle Circles/Pumps: AROM, Both, 10 reps Quad Sets: AROM, Both, 5 reps Heel Slides: AAROM, Left, 5 reps Straight Leg Raises: AROM, AAROM, Left, 5 reps   Assessment/Plan    PT Assessment Patient needs continued PT services  PT Problem List Decreased range of motion;Decreased strength;Decreased mobility;Pain;Decreased activity tolerance;Decreased balance;Cardiopulmonary status limiting activity       PT Treatment Interventions Gait training;Therapeutic exercise;DME instruction;Therapeutic activities;Patient/family education;Functional mobility training;Stair training    PT Goals (Current goals can be found in the Care Plan section)  Acute Rehab PT Goals Patient Stated Goal: feel better PT Goal Formulation: With patient Time For Goal Achievement: 02/06/24 Potential to Achieve Goals: Good    Frequency Min 4X/week     Co-evaluation               AM-PAC PT 6 Clicks Mobility  Outcome Measure Help needed turning from your back to your side while in a flat bed without  using bedrails?: A Little Help needed moving from lying on your back to sitting on the side of a flat bed without using bedrails?: A Little Help needed moving to and from a bed to a chair (including a wheelchair)?: A Lot Help needed standing up from a chair using your arms (e.g., wheelchair or bedside chair)?: A Lot Help needed to walk in hospital room?: Total Help needed climbing 3-5 steps with a railing? : Total 6 Click Score: 12    End of Session   Activity Tolerance: Patient limited by fatigue Patient left: with call bell/phone within reach;in bed;with bed alarm set   PT Visit Diagnosis: Pain;Difficulty in walking, not elsewhere classified (R26.2);Muscle weakness (generalized) (M62.81) Pain - Right/Left: Left Pain - part of body: Leg    Time: 1515-1540 PT Time Calculation (min) (ACUTE ONLY): 25 min   Charges:   PT Evaluation $PT  Eval Low Complexity: 1 Low PT Treatments $Therapeutic Activity: 8-22 mins PT General Charges $$ ACUTE PT VISIT: 1 Visit         Kaianna Dolezal, PT  Acute Rehab Dept [facility]) [phone]  01/23/2024   [facility] 01/23/2024, 5:11 PM

## 2024-01-23 NOTE — Progress Notes (Signed)
 Lab called in for critical Troponin 348. STAT EKG obtained as ordered. Pt has no c/o chest pain or SOB. MD notified about the results.

## 2024-01-23 NOTE — Plan of Care (Signed)
  Problem: Activity: Goal: Risk for activity intolerance will decrease Outcome: Progressing   Problem: Nutrition: Goal: Adequate nutrition will be maintained Outcome: Progressing   Problem: Coping: Goal: Level of anxiety will decrease Outcome: Progressing   Problem: Skin Integrity: Goal: Risk for impaired skin integrity will decrease Outcome: Progressing   Problem: Skin Integrity: Goal: Skin integrity will improve Outcome: Progressing

## 2024-01-24 ENCOUNTER — Inpatient Hospital Stay (HOSPITAL_COMMUNITY)

## 2024-01-24 DIAGNOSIS — I442 Atrioventricular block, complete: Secondary | ICD-10-CM

## 2024-01-24 DIAGNOSIS — I2510 Atherosclerotic heart disease of native coronary artery without angina pectoris: Secondary | ICD-10-CM | POA: Diagnosis not present

## 2024-01-24 DIAGNOSIS — E785 Hyperlipidemia, unspecified: Secondary | ICD-10-CM

## 2024-01-24 DIAGNOSIS — L03116 Cellulitis of left lower limb: Secondary | ICD-10-CM | POA: Diagnosis not present

## 2024-01-24 DIAGNOSIS — I10 Essential (primary) hypertension: Secondary | ICD-10-CM

## 2024-01-24 DIAGNOSIS — I480 Paroxysmal atrial fibrillation: Secondary | ICD-10-CM | POA: Diagnosis not present

## 2024-01-24 DIAGNOSIS — R7989 Other specified abnormal findings of blood chemistry: Secondary | ICD-10-CM

## 2024-01-24 DIAGNOSIS — J189 Pneumonia, unspecified organism: Secondary | ICD-10-CM | POA: Diagnosis not present

## 2024-01-24 LAB — BASIC METABOLIC PANEL WITH GFR
Anion gap: 11 (ref 5–15)
BUN: 52 mg/dL — ABNORMAL HIGH (ref 8–23)
CO2: 22 mmol/L (ref 22–32)
Calcium: 9.2 mg/dL (ref 8.9–10.3)
Chloride: 104 mmol/L (ref 98–111)
Creatinine, Ser: 1.3 mg/dL — ABNORMAL HIGH (ref 0.61–1.24)
GFR, Estimated: 57 mL/min — ABNORMAL LOW (ref >60)
Glucose, Bld: 117 mg/dL — ABNORMAL HIGH (ref 70–99)
Potassium: 4.8 mmol/L (ref 3.5–5.1)
Sodium: 137 mmol/L (ref 135–145)

## 2024-01-24 LAB — ECHOCARDIOGRAM COMPLETE
AR max vel: 3.09 cm2
AV Area VTI: 3.51 cm2
AV Area mean vel: 3.1 cm2
AV Mean grad: 4 mmHg
AV Peak grad: 7.1 mmHg
Ao pk vel: 1.33 m/s
Area-P 1/2: 3.24 cm2
Height: 71 in
S' Lateral: 2.7 cm
Weight: 2991.2 [oz_av]

## 2024-01-24 LAB — CBC
HCT: 33 % — ABNORMAL LOW (ref 39.0–52.0)
Hemoglobin: 10.4 g/dL — ABNORMAL LOW (ref 13.0–17.0)
MCH: 30.5 pg (ref 26.0–34.0)
MCHC: 31.5 g/dL (ref 30.0–36.0)
MCV: 96.8 fL (ref 80.0–100.0)
Platelets: 382 K/uL (ref 150–400)
RBC: 3.41 MIL/uL — ABNORMAL LOW (ref 4.22–5.81)
RDW: 14.8 % (ref 11.5–15.5)
WBC: 16.4 K/uL — ABNORMAL HIGH (ref 4.0–10.5)
nRBC: 0 % (ref 0.0–0.2)

## 2024-01-24 LAB — APTT
aPTT: 42 s — ABNORMAL HIGH (ref 24–36)
aPTT: 66 s — ABNORMAL HIGH (ref 24–36)

## 2024-01-24 LAB — MRSA NEXT GEN BY PCR, NASAL: MRSA by PCR Next Gen: NOT DETECTED

## 2024-01-24 LAB — HEPARIN LEVEL (UNFRACTIONATED): Heparin Unfractionated: >1.1 [IU]/mL — ABNORMAL HIGH (ref 0.30–0.70)

## 2024-01-24 MED ORDER — VANCOMYCIN HCL 1500 MG/300ML IV SOLN: 1500.0000 mg | INTRAVENOUS | Status: DC

## 2024-01-24 MED ORDER — HEPARIN BOLUS VIA INFUSION
1500.0000 [IU] | Freq: Once | INTRAVENOUS | Status: AC
  Administered 2024-01-24: 1500 [IU] via INTRAVENOUS
  Filled 2024-01-24: qty 1500

## 2024-01-24 NOTE — Progress Notes (Addendum)
 PHARMACY - ANTICOAGULATION CONSULT NOTE  Pharmacy Consult for Heparin  Indication: afib, r/o new PE  Allergies  Allergen Reactions   Niaspan [Niacin] Hives, Itching and Other (See Comments)    [facility] ONLY    Patient Measurements: Height: 5' 11 (180.3 cm) Weight: 84.8 kg (186 lb 15.2 oz) IBW/kg (Calculated) : 75.3 HEPARIN  DW (KG): 84.8  Vital Signs: Temp: 98 F (36.7 C) (11/27 0535) Temp Source: Oral (11/27 0535) BP: 162/84 (11/27 0535) Pulse Rate: 94 (11/27 0535)  Labs: Recent Labs    01/22/24 1405 01/23/24 0323 01/24/24 0618  HGB 10.9* 10.2* 10.4*  HCT 33.5* 32.4* 33.0*  PLT 317 285 382  APTT  --   --  42*  HEPARINUNFRC  --   --  >1.10*  CREATININE 1.71* 1.60* 1.30*    Estimated Creatinine Clearance: 51.5 mL/min (A) (by C-G formula based on SCr of 1.3 mg/dL (H)).   Medical History: Past Medical History:  Diagnosis Date   Anginal pain    Arthritis    Atrial fibrillation (HCC)    a. post op in 2006  b. recurrence on 08/2016 admission for NSTEMI   CAD (coronary artery disease)    a. 2006: s/p CABG at [facility] with LIMA to LAD, SVG to PDA, SVG to OM1, SVG to D1  b. 08/2016: NSTEMI 09/18/16 which showed severe native 3V CAD with patent SVG-->PLA, LIMA-->LAD and total occlusion of SVG--> OM1 with unsuccessful attempt at PCI of SVG--> OM with inability to restore flow.    Cancer (HCC)    Rt. arm,head squamus.   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 Now on IV heparin  for ?PE. Ddimer 1.49 elevated, Increased SOB and chest discomfort with elevated troponins also. - 11/26: Dopplers negative - 11/26 PM CT with PNA - Heparin  level  >1.1 as expected on Eliquis , aPTT 42. No problems with infusion noted by RN.  Goal of Therapy:  aPTT 66-102 seconds Monitor platelets by anticoagulation protocol: Yes   Plan:  Bolus heparin  1500 units (1/2 bolus due to recent knee surgery) Increase infusion to 1450 units/hr Recheck aPTT in 6 hrs Daily HL, aPTT, and CBC   William Hatfield, PharmD, BCPS Clinical Staff Pharmacist William Hatfield 01/24/2024,7:33 AM

## 2024-01-24 NOTE — Progress Notes (Addendum)
 Pharmacy Antibiotic Note  William Hatfield is a 78 y.o. male admitted on 01/22/2024 with cellulitis.  Pharmacy has been consulted for Vanco dosing.   ID: Post-op L leg cellulitis/PNA, status post left total knee arthroplasty on 01/16/2024  - 11/26 PM CT with PNA WBC 16.4 up, Afebrile, Scr 1.3 down  Ancef  x 1 in ED  Vancomycin  11/25 >> Ceftriaxone  11/25 >>   Dose adjustments - Vancomycin  1250mg  IV q36h (eAUC 479, Scr 1.71, TBW) x7d - 11/27: Vanco 1500mg  IV q24h (AUC 521, Scr 1.3)  Plan: Adjust Vanco 1500mg  IV q24h (AUC 521, Scr 1.3 down) starting tomorrow (already had dose this AM) Ortho indications no signs of infection    Height: 5' 11 (180.3 cm) Weight: 84.8 kg (186 lb 15.2 oz) IBW/kg (Calculated) : 75.3  Temp (24hrs), Avg:98.1 F (36.7 C), Min:97.7 F (36.5 C), Max:98.6 F (37 C)  Recent Labs  Lab 01/22/24 1405 01/22/24 1618 01/23/24 0323 01/24/24 0618  WBC 13.2*  --  12.7* 16.4*  CREATININE 1.71*  --  1.60* 1.30*  LATICACIDVEN  --  1.7  --   --     Estimated Creatinine Clearance: 51.5 mL/min (A) (by C-G formula based on SCr of 1.3 mg/dL (H)).    Allergies  Allergen Reactions   Niaspan [Niacin] Hives, Itching and Other (See Comments)    [facility] ONLY    Metha Kolasa Karoline Marina, PharmD, BCPS Clinical Staff Pharmacist   [place], [place] 01/24/2024 7:42 AM

## 2024-01-24 NOTE — Progress Notes (Signed)
 Subjective:    Patient reports pain as mild.   Patient seen in rounds for Dr. Duwayne. Patient is doing fair this morning, pain in the knee well controlled currently. Had spiral CT yesterday to r/o PE.  Objective: Vital signs in last 24 hours: Temp:  [97.7 F (36.5 C)-98.6 F (37 C)] 98 F (36.7 C) (11/27 0535) Pulse Rate:  [59-94] 94 (11/27 0535) Resp:  [18-20] 18 (11/27 0535) BP: (127-162)/(65-86) 162/84 (11/27 0535) SpO2:  [98 %-100 %] 100 % (11/27 0535)  Intake/Output from previous day:  Intake/Output Summary (Last 24 hours) at 01/24/2024 0738 Last data filed at 01/24/2024 0600 Gross per 24 hour  Intake 2400.28 ml  Output 1450 ml  Net 950.28 ml    Intake/Output this shift: No intake/output data recorded.  Labs: Recent Labs    01/22/24 1405 01/23/24 0323 01/24/24 0618  HGB 10.9* 10.2* 10.4*   Recent Labs    01/23/24 0323 01/24/24 0618  WBC 12.7* 16.4*  RBC 3.41* 3.41*  HCT 32.4* 33.0*  PLT 285 382   Recent Labs    01/23/24 0323 01/24/24 0618  NA 137 137  K 5.3* 4.8  CL 102 104  CO2 24 22  BUN 64* 52*  CREATININE 1.60* 1.30*  GLUCOSE 110* 117*  CALCIUM  9.1 9.2   No results for input(s): LABPT, INR in the last 72 hours.  Exam: General - Patient is Alert and Oriented Extremity - Neurologically intact Neurovascular intact Sensation intact distally Dorsiflexion/Plantar flexion intact Dressing/Incision - well healing arthroplasty incision with staples intact. There is a small amount of blanchable erythema. No overt warmth. Mild swelling.  Motor Function - intact, moving foot and toes well on exam.   Past Medical History:  Diagnosis Date   Anginal pain    Arthritis    Atrial fibrillation (HCC)    a. post op in 2006  b. recurrence on 08/2016 admission for NSTEMI   CAD (coronary artery disease)    a. 2006: s/p CABG at [facility] with LIMA to LAD, SVG to PDA, SVG to OM1, SVG to D1  b. 08/2016: NSTEMI 09/18/16 which showed severe native 3V CAD with  patent SVG-->PLA, LIMA-->LAD and total occlusion of SVG--> OM1 with unsuccessful attempt at PCI of SVG--> OM with inability to restore flow.    Cancer (HCC)    Rt. arm,head squamus.   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   Essential hypertension    Myocardial infarction (HCC) 09/18/2016   Peripheral vascular disease    Pneumonia 2020   COVID PNA   Presence of permanent cardiac pacemaker    Tremor     Assessment/Plan:    Principal Problem:   Left leg cellulitis Active Problems:   Hypertension   CAD (coronary artery disease)   PAF (paroxysmal atrial fibrillation) (HCC)   Acute renal failure superimposed on stage 3a chronic kidney disease (HCC)  Estimated body mass index is 26.07 kg/m as calculated from the following:   Height as of this encounter: 5' 11 (1.803 m).   Weight as of this encounter: 84.8 kg.  Plan to mobilize with therapy today DVT prophylaxis switched to heparin  Weight-bearing as tolerated LLE  Left knee appears to be doing well. On exam, the small area of erythema is blanchable. This is consistent with a one week postop knee, no signs of infection.  Cardiology consulted  yesterday  Roxie Mess, PA-C Orthopedic Surgery [phone] 01/24/2024, 7:38 AM

## 2024-01-24 NOTE — Plan of Care (Signed)
  Problem: Nutrition: Goal: Adequate nutrition will be maintained Outcome: Progressing   Problem: Coping: Goal: Level of anxiety will decrease Outcome: Progressing   Problem: Elimination: Goal: Will not experience complications related to urinary retention Outcome: Progressing   Problem: Pain Managment: Goal: General experience of comfort will improve and/or be controlled Outcome: Progressing   Problem: Skin Integrity: Goal: Risk for impaired skin integrity will decrease Outcome: Progressing   Problem: Skin Integrity: Goal: Skin integrity will improve Outcome: Progressing
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
## 2024-01-24 NOTE — Progress Notes (Signed)
 PT Cancellation Note  Patient Details Name: William Hatfield MRN: [id number] DOB: Dec 15, 1945   Cancelled Treatment:     Holding off all Physical Therapy today due to medical.  Heparin  level outside our parameters as well as continued medical workup.  Rehab Team to attempt to see tomorrow pending medical.  Katheryn Leap  PTA Acute  Rehabilitation Services Office M-F          [phone]

## 2024-01-24 NOTE — Progress Notes (Signed)
 PROGRESS NOTE    William Hatfield  FMW:[id number] DOB: December 20, 1945 DOA: 01/22/2024 PCP: Henriette Anes, DO     Brief Narrative:  William Hatfield is a 78 y.o. 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   Patient began feeling generally poor with nausea, fever, chills, and fatigue 2 or 3 days ago.  His daughter noted today that the left knee appeared red and swollen.  Patient has resumed full dose Eliquis  since the procedure.   Patient was admitted for suspected left lower extremity cellulitis and started on broad-spectrum antibiotics.  New events last 24 hours / Subjective: Complains of central chest pressure as well as exertional shortness of breath.  No significant cough.  Left lower extremity has improved.  Assessment & Plan:   Principal Problem:   HCAP (healthcare-associated pneumonia) Active Problems:   Hypertension   CAD (coronary artery disease)   Hx of CABG   Dyslipidemia   PAF (paroxysmal atrial fibrillation) (HCC)   Left leg cellulitis   Acute renal failure superimposed on stage 3a chronic kidney disease (HCC)   Left leg cellulitis, ruled out - Evaluated by orthopedic surgery, they do not feel that the erythema is from an infection, but rather expected postop changes - Venous duplex lower extremity negative for DVT  Pneumonia - Continue vancomycin , cefepime .  Check MRSA PCR -if negative can discontinue vancomycin   Elevated D-dimer - Unable to obtain CTA chest due to AKI - VQ scan ordered  Chest pain, with mildly elevated troponin History of CAD status post CABG - Continue Lipitor, Imdur , metoprolol  - Echocardiogram without wall motion abnormality - Cardiology consulted - Currently on IV heparin   Complete heart block status post pacemaker in  2021  AKI on CKD 3A - Baseline creatinine 1.3 - Held losartan , Lasix , Jardiance  - Improved and now back to baseline  Paroxysmal A-fib - Eliquis  prior to admission, metoprolol     DVT prophylaxis: Eliquis  --> IV heparin    Code Status: Full code Family Communication: Daughter at bedside Disposition Plan: Possible rehab, pending PT evaluation Status is: Inpatient Remains inpatient appropriate because: IV antibiotics and further workup    Antimicrobials:  Anti-infectives (From admission, onward)    Start     Dose/Rate Route Frequency Ordered Stop   01/25/24 0600  vancomycin  (VANCOREADY) IVPB 1500 mg/300 mL        1,500 mg 150 mL/hr over 120 Minutes Intravenous Every 24 hours 01/24/24 0742     01/24/24 0600  vancomycin  (VANCOREADY) IVPB 1250 mg/250 mL  Status:  Discontinued        1,250 mg 166.7 mL/hr over 90 Minutes Intravenous Every 36 hours 01/22/24 1722 01/24/24 0741   01/23/24 1215  ceFEPIme  (MAXIPIME ) 2 g in sodium chloride  0.9 % 100 mL IVPB        2 g 200 mL/hr over 30 Minutes Intravenous Every 12 hours 01/23/24 1122 01/30/24 1214   01/22/24 2300  cefTRIAXone  (ROCEPHIN ) 1 g in sodium chloride  0.9 % 100 mL IVPB  Status:  Discontinued        1 g 200 mL/hr over 30 Minutes Intravenous Every 24 hours 01/22/24 1707 01/23/24 1122   01/22/24 1545  vancomycin  (VANCOCIN ) IVPB 1000 mg/200 mL premix        1,000 mg 200 mL/hr over 60 Minutes Intravenous  Once 01/22/24 1539 01/22/24 1840   01/22/24  1545  ceFAZolin  (ANCEF ) IVPB 1 g/50 mL premix        1 g 100 mL/hr over 30 Minutes Intravenous  Once 01/22/24 1539 01/22/24 1621        Objective: Vitals:   01/23/24 1441 01/23/24 2134 01/24/24 0535 01/24/24 0917  BP: 127/65 135/66 (!) 162/84 132/89  Pulse: 60 60 94 100  Resp: 20 19 18    Temp: 98.2 F (36.8 C) 98.6 F (37 C) 98 F (36.7 C) 98.2 F (36.8 C)  TempSrc: Oral Oral Oral Oral  SpO2: 98% 99% 100%   Weight:      Height:        Intake/Output Summary (Last 24  hours) at 01/24/2024 1215 Last data filed at 01/24/2024 1158 Gross per 24 hour  Intake 1887.83 ml  Output 1650 ml  Net 237.83 ml   Filed Weights   01/22/24 1703  Weight: 84.8 kg    Examination:  General exam: Appears calm and comfortable  Respiratory system: Clear to auscultation. Respiratory effort normal. No respiratory distress. No conversational dyspnea.  On nasal cannula O2 Cardiovascular system: S1 & S2 heard, RRR. No murmurs. No pedal edema. Gastrointestinal system: Abdomen is nondistended, soft and nontender. Normal bowel sounds heard. Central nervous system: Alert and oriented. No focal neurological deficits. Speech clear.  Extremities: Incision over the left knee is clean, staples are intact.  Mild erythema.  No drainage from the incision Psychiatry: Judgement and insight appear normal. Mood & affect appropriate.   Data Reviewed: I have personally reviewed following labs and imaging studies  CBC: Recent Labs  Lab 01/22/24 1405 01/23/24 0323 01/24/24 0618  WBC 13.2* 12.7* 16.4*  NEUTROABS 10.5* 9.5*  --   HGB 10.9* 10.2* 10.4*  HCT 33.5* 32.4* 33.0*  MCV 92.3 95.0 96.8  PLT 317 285 382   Basic Metabolic Panel: Recent Labs  Lab 01/22/24 1405 01/23/24 0323 01/24/24 0618  NA 137 137 137  K 5.1 5.3* 4.8  CL 98 102 104  CO2 24 24 22   GLUCOSE 134* 110* 117*  BUN 70* 64* 52*  CREATININE 1.71* 1.60* 1.30*  CALCIUM  9.7 9.1 9.2   GFR: Estimated Creatinine Clearance: 51.5 mL/min (A) (by C-G formula based on SCr of 1.3 mg/dL (H)). Liver Function Tests: Recent Labs  Lab 01/22/24 1405 01/23/24 0323  AST 41 45*  ALT 63* 53*  ALKPHOS 206* 206*  BILITOT 0.8 0.7  PROT 7.2 6.5  ALBUMIN 3.7 3.2*   No results for input(s): LIPASE, AMYLASE in the last 168 hours. No results for input(s): AMMONIA in the last 168 hours. Coagulation Profile: No results for input(s): INR, PROTIME in the last 168 hours. Cardiac Enzymes: No results for input(s): CKTOTAL,  CKMB, CKMBINDEX, TROPONINI in the last 168 hours. BNP (last 3 results) No results for input(s): PROBNP in the last 8760 hours. HbA1C: No results for input(s): HGBA1C in the last 72 hours. CBG: No results for input(s): GLUCAP in the last 168 hours. Lipid Profile: No results for input(s): CHOL, HDL, LDLCALC, TRIG, CHOLHDL, LDLDIRECT in the last 72 hours. Thyroid Function Tests: No results for input(s): TSH, T4TOTAL, FREET4, T3FREE, THYROIDAB in the last 72 hours. Anemia Panel: No results for input(s): VITAMINB12, FOLATE, FERRITIN, TIBC, IRON, RETICCTPCT in the last 72 hours. Sepsis Labs: Recent Labs  Lab 01/22/24 1618  LATICACIDVEN 1.7    Recent Results (from the past 240 hours)  Culture, blood (routine x 2)     Status: None (Preliminary result)   Collection Time: 01/22/24  4:10 PM   Specimen: BLOOD  Result Value Ref Range Status   Specimen Description   Final    BLOOD LEFT ANTECUBITAL Performed at [facility], [address], [place], [place] [address]    Special Requests   Final    BOTTLES DRAWN AEROBIC AND ANAEROBIC Blood Culture adequate volume Performed at [facility], [address] [place], [place] [address]    Culture   Final    NO GROWTH 2 DAYS Performed at [facility], [address] [place], [place] [address]    Report Status PENDING  Incomplete  MRSA Next Gen by PCR, Nasal     Status: None   Collection Time: 01/24/24  8:12 AM   Specimen: Nasal Mucosa; Nasal Swab  Result Value Ref Range Status   MRSA by PCR Next Gen NOT DETECTED NOT DETECTED Final    Comment: (NOTE) The GeneXpert MRSA Assay (FDA approved for NASAL specimens only), is one component of a comprehensive MRSA colonization surveillance program. It is not intended to diagnose MRSA infection nor to guide or monitor treatment for MRSA infections. Test performance is not FDA approved in patients less than 57  years old. Performed at [facility], [address] [place], [place] [address]       Radiology Studies: ECHOCARDIOGRAM COMPLETE Result Date: 01/24/2024    ECHOCARDIOGRAM REPORT   Patient Name:   William Hatfield Date of Exam: 01/24/2024 Medical Rec #:  [id number]   Height:       71.0 in Accession #:    [id number]  Weight:       186.9 lb Date of Birth:  Nov 12, 1945   BSA:          2.049 m Patient Age:    77 years    BP:           162/84 mmHg Patient Gender: M           HR:           102 bpm. Exam Location:  Inpatient Procedure: 2D Echo, Cardiac Doppler and Color Doppler (Both Spectral and Color            Flow Doppler were utilized during procedure). Indications:    Elevated Troponin  History:        Patient has prior history of Echocardiogram examinations, most                 recent 01/01/2020. CAD and Previous Myocardial Infarction; Risk                 Factors:Hypertension.  Sonographer:    [name] Gaskins Referring Phys: [id number] ABIGAIL CHAVEZ IMPRESSIONS  1. Left ventricular ejection fraction, by estimation, is 50 to 55%. The left ventricle has low normal function. The left ventricle has no regional wall motion abnormalities. There is severe concentric left ventricular hypertrophy. Left ventricular diastolic parameters are indeterminate.  2. Right ventricular systolic function is normal. The right ventricular size is normal.  3. The mitral valve is normal in structure. Mild to moderate mitral valve regurgitation. No evidence of mitral stenosis.  4. The aortic valve has an indeterminant number of cusps. Aortic valve regurgitation is not visualized. No aortic stenosis is present. FINDINGS  Left Ventricle: Left ventricular ejection fraction, by estimation, is 50 to 55%. The left ventricle has low normal function. The left ventricle has no regional wall motion abnormalities. The left ventricular internal cavity size was normal in size. There is  severe concentric left ventricular hypertrophy.  Left ventricular diastolic parameters are indeterminate. Right Ventricle: The right ventricular size is normal. No increase in right ventricular wall thickness. Right ventricular systolic function is normal. Left Atrium: Left atrial size was normal in size. Right Atrium: Right atrial size was normal in size. Pericardium: There is no evidence of pericardial effusion. Mitral Valve: The mitral valve is normal in structure. Mild mitral annular calcification. Mild to moderate mitral valve regurgitation. No evidence of mitral valve stenosis. Tricuspid Valve: The tricuspid valve is normal in structure. Tricuspid valve regurgitation is mild . No evidence of tricuspid stenosis. Aortic Valve: The aortic valve has an indeterminant number of cusps. Aortic valve regurgitation is not visualized. No aortic stenosis is present. Aortic valve mean gradient measures 4.0 mmHg. Aortic valve peak gradient measures 7.1 mmHg. Aortic valve area, by VTI measures 3.51 cm. Pulmonic Valve: The pulmonic valve was not well visualized. Pulmonic valve regurgitation is trivial. No evidence of pulmonic stenosis. Aorta: The aortic root is normal in size and structure. Venous: The inferior vena cava was not well visualized. IAS/Shunts: No atrial level shunt detected by color flow Doppler.  LEFT VENTRICLE PLAX 2D LVIDd:         3.70 cm LVIDs:         2.70 cm LV PW:         1.50 cm LV IVS:        1.70 cm LVOT diam:     2.00 cm LV SV:         78 LV SV Index:   38 LVOT Area:     3.14 cm  RIGHT VENTRICLE RV S prime:     11.00 cm/s TAPSE (M-mode): 1.8 cm LEFT ATRIUM             Index        RIGHT ATRIUM           Index LA Vol (A2C):   65.1 ml 31.77 ml/m  RA Area:     18.40 cm LA Vol (A4C):   44.8 ml 21.86 ml/m  RA Volume:   49.20 ml  24.01 ml/m LA Biplane Vol: 56.2 ml 27.43 ml/m  AORTIC VALVE AV Area (Vmax):    3.09 cm AV Area (Vmean):   3.10 cm AV Area (VTI):     3.51 cm AV Vmax:           133.00 cm/s AV Vmean:          99.800 cm/s AV VTI:             0.223 m AV Peak Grad:      7.1 mmHg AV Mean Grad:      4.0 mmHg LVOT Vmax:         131.00 cm/s LVOT Vmean:        98.500 cm/s LVOT VTI:          0.249 m LVOT/AV VTI ratio: 1.12  AORTA Ao Root diam: 3.10 cm MITRAL VALVE MV Area (PHT): 3.24 cm     SHUNTS MV E velocity: 140.00 cm/s  Systemic VTI:  0.25 m                             Systemic Diam: 2.00 cm Kardie Tobb DO Electronically signed by Dub Huntsman DO Signature Date/Time: 01/24/2024/9:35:31 AM    Final    CT CHEST WO CONTRAST Result Date: 01/23/2024 EXAM: CT CHEST WITHOUT CONTRAST 01/23/2024 09:12:00  PM TECHNIQUE: CT of the chest was performed without the administration of intravenous contrast. Multiplanar reformatted images are provided for review. Automated exposure control, iterative reconstruction, and/or weight based adjustment of the mA/kV was utilized to reduce the radiation dose to as low as reasonably achievable. COMPARISON: Comparison with chest radiograph 01/22/2024. CLINICAL HISTORY: Pneumonia, complication suspected, xray done. FINDINGS: MEDIASTINUM: Cardiac pacemaker. Postoperative changes in the mediastinum consistent with coronary bypass. Calcification in the aorta and coronary arteries. The central airways are clear. LYMPH NODES: No mediastinal, hilar or axillary lymphadenopathy. LUNGS AND PLEURA: Emphysematous changes in the lungs. Small bilateral pleural effusions with basilar atelectasis or consolidation, greater on the left. Patchy infiltrates are demonstrated focally in the upper lungs and left lingula, likely multifocal pneumonia. No pneumothorax. SOFT TISSUES/BONES: Degenerative changes in the spine. Sternotomy wires. UPPER ABDOMEN: Mild infiltration and stranding around the tail of the pancreas may indicate early changes of acute pancreatitis. No loculated collection. IMPRESSION: 1. Findings consistent with multifocal pneumonia, with patchy infiltrates in the upper lungs and left lingula, and basilar atelectasis or consolidation  greater on the left. 2. Small bilateral pleural effusions. 3. Emphysematous changes in the lungs. 4. Mild infiltration and stranding around the tail of the pancreas, possibly indicating early changes of acute pancreatitis. No loculated collection. Electronically signed by: Elsie Gravely MD 01/23/2024 09:26 PM EST RP Workstation: [id number]   VAS US  LOWER EXTREMITY VENOUS (DVT) Result Date: 01/23/2024  Lower Venous DVT Study Patient Name:  William Hatfield  Date of Exam:   01/23/2024 Medical Rec #: [id number]    Accession #:    [id number] Date of Birth: 03-24-1945    Patient Gender: M Patient Age:   71 years Exam Location:  [facility] Procedure:      VAS US  LOWER EXTREMITY VENOUS (DVT) Referring Phys: JEFFREY BEANE --------------------------------------------------------------------------------  Indications: Pain, Swelling, and Erythema. Other Indications: Cellulitis. Risk Factors: Surgery Left TKA 01/16/2024. Anticoagulation: Eliquis . Limitations: Poor ultrasound/tissue interface. Comparison Study: No previous exams Performing Technologist: Jody Hill RVT, RDMS  Examination Guidelines: A complete evaluation includes B-mode imaging, spectral Doppler, color Doppler, and power Doppler as needed of all accessible portions of each vessel. Bilateral testing is considered an integral part of a complete examination. Limited examinations for reoccurring indications may be performed as noted. The reflux portion of the exam is performed with the patient in reverse Trendelenburg.  +-----+---------------+---------+-----------+----------+--------------+ RIGHTCompressibilityPhasicitySpontaneityPropertiesThrombus Aging +-----+---------------+---------+-----------+----------+--------------+ CFV  Full           No       Yes        pulsatile                +-----+---------------+---------+-----------+----------+--------------+   +---------+---------------+---------+-----------+----------+--------------+ LEFT      CompressibilityPhasicitySpontaneityPropertiesThrombus Aging +---------+---------------+---------+-----------+----------+--------------+ CFV      Full           No       Yes        pulsatile                +---------+---------------+---------+-----------+----------+--------------+ SFJ      Full                                                        +---------+---------------+---------+-----------+----------+--------------+ FV Prox  Full           Yes      Yes                                 +---------+---------------+---------+-----------+----------+--------------+  FV Mid   Full           Yes      Yes                                 +---------+---------------+---------+-----------+----------+--------------+ FV DistalFull           Yes      Yes                                 +---------+---------------+---------+-----------+----------+--------------+ PFV      Full                                                        +---------+---------------+---------+-----------+----------+--------------+ POP      Full           Yes      Yes                                 +---------+---------------+---------+-----------+----------+--------------+ PTV      Full                                                        +---------+---------------+---------+-----------+----------+--------------+ PERO     Full                                                        +---------+---------------+---------+-----------+----------+--------------+     Summary: RIGHT: - No evidence of common femoral vein obstruction.   LEFT: - There is no evidence of deep vein thrombosis in the lower extremity.  - No cystic structure found in the popliteal fossa. Subcutaneous edema seen in area of calf and ankle.  *See table(s) above for measurements and observations. Electronically signed by Lonni Gaskins MD on 01/23/2024 at 12:08:52 PM.    Final    DG Tibia/Fibula Left Result Date:  01/22/2024 CLINICAL DATA:  poss post-op infx assessment EXAM: LEFT TIBIA AND FIBULA - 2 VIEW COMPARISON:  None Available. FINDINGS: Subcutaneous surgical clip along the mid to distal tibia medially.No acute fracture or dislocation. No radiographic findings suggestive of osteomyelitis. Soft tissues are unremarkable. IMPRESSION: No acute fracture or malalignment of the tibia and fibula. Electronically Signed   By: Rogelia Myers M.D.   On: 01/22/2024 16:26   DG Knee Complete 4 Views Left Result Date: 01/22/2024 CLINICAL DATA:  poss post-op infx assessment EXAM: LEFT KNEE - COMPLETE 4+ VIEW COMPARISON:  01/16/2024 FINDINGS: Well-aligned knee arthroplasty without acute fracture.No periprosthetic lucency to suggest loosening.No dislocation. Decreasing, but persistent subcutaneous gas with gas in the joint space. Moderate soft tissue swelling about the knee. Surgical skin staples. IMPRESSION: 1. Moderate soft tissue swelling about the knee. Decreasing, but persistent subcutaneous gas with gas noted in the joint space. These may be expected findings at this stage in the postoperative recovery. Correlation with  physical exam findings recommended. 2. Well-aligned knee arthroplasty without acute fracture or dislocation. Electronically Signed   By: Rogelia Myers M.D.   On: 01/22/2024 16:25   DG Chest 2 View Result Date: 01/22/2024 CLINICAL DATA:  weakness EXAM: CHEST - 2 VIEW COMPARISON:  February 20, 2020 FINDINGS: Low lung volumes with elevation of the right hemidiaphragm. Hazy opacities in the left perihilar mid lung. No pneumothorax or pleural effusion. Sternotomy wires and CABG markers. Mild cardiomegaly. Left chest pacemaker with leads terminating in the right atrium and right ventricle. Tortuous aorta with aortic atherosclerosis. No acute fracture or destructive lesions. Multilevel thoracic osteophytosis. IMPRESSION: Hazy opacities in the left perihilar mid lung, which may represent atelectasis or a  developing bronchopneumonia, in the correct clinical context no pleural effusion. Electronically Signed   By: Rogelia Myers M.D.   On: 01/22/2024 16:22      Scheduled Meds:  allopurinol   100 mg Oral Q0600   atorvastatin   40 mg Oral QPM   isosorbide  mononitrate  30 mg Oral Daily   lactulose   20 g Oral BID   metoprolol  tartrate  25 mg Oral BID   sodium chloride  flush  3 mL Intravenous Q12H   Continuous Infusions:  ceFEPime  (MAXIPIME ) IV 2 g (01/23/24 2328)   heparin  1,450 Units/hr (01/24/24 0754)   [START ON 01/25/2024] vancomycin        LOS: 2 days   Time spent: 40 minutes   Delon Hoe, DO [facility] 01/24/2024, 12:15 PM   Available via Epic secure chat 7am-7pm After these hours, please refer to coverage provider listed on [email or web address]

## 2024-01-24 NOTE — Progress Notes (Addendum)
 PHARMACY - ANTICOAGULATION CONSULT NOTE  Pharmacy Consult for Heparin  Indication: afib, r/o new PE  Allergies  Allergen Reactions   Niaspan [Niacin] Hives, Itching and Other (See Comments)    [facility] ONLY    Patient Measurements: Height: 5' 11 (180.3 cm) Weight: 84.8 kg (186 lb 15.2 oz) IBW/kg (Calculated) : 75.3 HEPARIN  DW (KG): 84.8  Vital Signs: Temp: 98.1 F (36.7 C) (11/27 1446) Temp Source: Oral (11/27 0917) BP: 136/71 (11/27 1446) Pulse Rate: 95 (11/27 1446)  Labs: Recent Labs    01/22/24 1405 01/23/24 0323 01/24/24 0618 01/24/24 1424  HGB 10.9* 10.2* 10.4*  --   HCT 33.5* 32.4* 33.0*  --   PLT 317 285 382  --   APTT  --   --  42* 66*  HEPARINUNFRC  --   --  >1.10*  --   CREATININE 1.71* 1.60* 1.30*  --     Estimated Creatinine Clearance: 51.5 mL/min (A) (by C-G formula based on SCr of 1.3 mg/dL (H)).  Assessment: AC/Heme: PTA Apixaban  (afib), Last dose 11/26 AM. Now on IV heparin  for ?PE. Ddimer 1.49 elevated, Increased SOB and chest discomfort with elevated troponins also. - 11/26: Dopplers negative - 11/26 PM CT w/o contrast:  PNA  -AM Labs: aPTT >1.1 as expected on Eliquis , aPTT 42. No problems with infusion noted by RN.  aPTT drawn at 1424 is therapeutic at 66 sec after 1500 unit bolus and heparin  rate increased to 1450 units/hr No bleeding reported  Goal of Therapy:  aPTT 66-102 seconds Monitor platelets by anticoagulation protocol: Yes   Plan:  Continue heparin  infusion @1450  units/hr Daily HL, aPTT, and CBC F/u VQ scan F/u ability to transition to DOAC  Rosaline IVAR Edison, Pharm.D Use secure chat for questions 01/24/2024 3:34 PM

## 2024-01-25 ENCOUNTER — Inpatient Hospital Stay (HOSPITAL_COMMUNITY)

## 2024-01-25 DIAGNOSIS — J189 Pneumonia, unspecified organism: Secondary | ICD-10-CM | POA: Diagnosis not present

## 2024-01-25 LAB — CBC
HCT: 32.4 % — ABNORMAL LOW (ref 39.0–52.0)
Hemoglobin: 10.2 g/dL — ABNORMAL LOW (ref 13.0–17.0)
MCH: 29.9 pg (ref 26.0–34.0)
MCHC: 31.5 g/dL (ref 30.0–36.0)
MCV: 95 fL (ref 80.0–100.0)
Platelets: 389 K/uL (ref 150–400)
RBC: 3.41 MIL/uL — ABNORMAL LOW (ref 4.22–5.81)
RDW: 14.8 % (ref 11.5–15.5)
WBC: 14.7 K/uL — ABNORMAL HIGH (ref 4.0–10.5)
nRBC: 0 % (ref 0.0–0.2)

## 2024-01-25 LAB — LIPID PANEL
Cholesterol: 71 mg/dL (ref 0–200)
HDL: 28 mg/dL — ABNORMAL LOW (ref >40)
LDL Cholesterol: 31 mg/dL (ref 0–99)
Total CHOL/HDL Ratio: 2.6 ratio
Triglycerides: 60 mg/dL (ref <150)
VLDL: 12 mg/dL (ref 0–40)

## 2024-01-25 LAB — BASIC METABOLIC PANEL WITH GFR
Anion gap: 8 (ref 5–15)
BUN: 41 mg/dL — ABNORMAL HIGH (ref 8–23)
CO2: 25 mmol/L (ref 22–32)
Calcium: 8.8 mg/dL — ABNORMAL LOW (ref 8.9–10.3)
Chloride: 105 mmol/L (ref 98–111)
Creatinine, Ser: 1.19 mg/dL (ref 0.61–1.24)
GFR, Estimated: >60 mL/min (ref >60)
Glucose, Bld: 116 mg/dL — ABNORMAL HIGH (ref 70–99)
Potassium: 4.8 mmol/L (ref 3.5–5.1)
Sodium: 138 mmol/L (ref 135–145)

## 2024-01-25 LAB — APTT: aPTT: 85 s — ABNORMAL HIGH (ref 24–36)

## 2024-01-25 LAB — HEPARIN LEVEL (UNFRACTIONATED): Heparin Unfractionated: 1.1 [IU]/mL — ABNORMAL HIGH (ref 0.30–0.70)

## 2024-01-25 MED ORDER — TECHNETIUM TO 99M ALBUMIN AGGREGATED
4.1500 | Freq: Once | INTRAVENOUS | Status: AC | PRN
  Administered 2024-01-25: 4.15 via INTRAVENOUS

## 2024-01-25 NOTE — TOC Initial Note (Signed)
 Transition of Care [facility]) - Initial/Assessment Note    Patient Details  Name: William Hatfield MRN: [id number] Date of Birth: [birth year]  Transition of Care [facility]) CM/SW Contact:    Heather DELENA Saltness, LCSW Phone Number: 01/25/2024, 9:39 AM  Clinical Narrative:                 Pt admitted to the hospital due to poor PO intake, weakness, LLE edema, and concern for post-op infection of the left knee. Pt recently discharged from [facility] after arthroplasty of left knee on 11/19. Per chart review, pt discharged home with RW and HH PT through [facility] [place]. Pt currently on IV heparin  with cellulitis of left knee. PT to re-evaluate pt today. TOC will continue to follow.   Expected Discharge Plan: Home w Home Health Services Barriers to Discharge: Continued Medical Work up   Patient Goals and CMS Choice Patient states their goals for this hospitalization and ongoing recovery are:: To return home        Expected Discharge Plan and Services In-house Referral: Clinical Social Work Discharge Planning Services: NA Post Acute Care Choice: Home Health Living arrangements for the past 2 months: Single Family Home                 DME Arranged: N/A DME Agency: NA       HH Arranged: PT HH Agency: [facility] Date HH Agency Contacted: 01/25/24 Time HH Agency Contacted: [phone] Representative spoke with at [facility] Agency: [place]  Prior Living Arrangements/Services Living arrangements for the past 2 months: Single Family Home Lives with:: Self Patient language and need for interpreter reviewed:: Yes Do you feel safe going back to the place where you live?: Yes      Need for Family Participation in Patient Care: Yes (Comment) Care giver support system in place?: Yes (comment) Current home services: Home PT Criminal Activity/Legal Involvement Pertinent to Current Situation/Hospitalization: No - Comment as needed  Activities of Daily Living   ADL Screening (condition at time of  admission) Independently performs ADLs?: Yes (appropriate for developmental age) Is the patient deaf or have difficulty hearing?: No Does the patient have difficulty seeing, even when wearing glasses/contacts?: No Does the patient have difficulty concentrating, remembering, or making decisions?: No  Permission Sought/Granted Permission sought to share information with : Case Manager, Family Supports Permission granted to share information with : Yes, Verbal Permission Granted  Share Information with NAME: [name] Spore  Permission granted to share info w AGENCY: [facility]  Permission granted to share info w Relationship: Daughter  Permission granted to share info w Contact Information: [phone]  Emotional Assessment Appearance:: Appears stated age Attitude/Demeanor/Rapport: Unable to Assess Affect (typically observed): Unable to Assess Orientation: : Oriented to Self, Oriented to Place, Oriented to  Time, Oriented to Situation Alcohol / Substance Use: Not Applicable Psych Involvement: No (comment)  Admission diagnosis:  Left leg cellulitis [L03.116] Patient Active Problem List   Diagnosis Date Noted   HCAP (healthcare-associated pneumonia) 01/24/2024   Left leg cellulitis 01/22/2024   Acute renal failure superimposed on stage 3a chronic kidney disease (HCC) 01/22/2024   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 [place] [place] [address] Phone: [phone] Fax: [phone]     Social Drivers of Health (SDOH) Social History: SDOH Screenings   Food Insecurity: No Food Insecurity  (01/22/2024)  Housing: Low Risk  (01/22/2024)  Transportation Needs: No Transportation Needs (01/22/2024)  Utilities: Not At Risk (01/22/2024)  Social Connections: Socially Isolated (01/22/2024)  Tobacco Use: Medium Risk (01/22/2024)   SDOH Interventions: None     Readmission Risk Interventions    01/25/2024    9:37 AM 10/03/2022    1:13 PM  Readmission Risk Prevention Plan  Post Dischage Appt Complete   Medication Screening Complete   Transportation Screening Complete Complete  Home Care Screening  Complete  Medication Review (RN CM)  Complete    Signed: Heather Saltness, MSW, LCSW Clinical Social Worker Inpatient Care Management 01/25/2024 9:47 AM

## 2024-01-25 NOTE — Progress Notes (Signed)
    Subjective:    Patient reports pain as 5 on 0-10 scale.    Voiding without difficulty. Positive flatus. Patient seen in rounds for Dr. Duwayne. Objective: Vital signs in last 24 hours: Temp:  [98.1 F (36.7 C)-98.3 F (36.8 C)] 98.1 F (36.7 C) (11/28 0457) Pulse Rate:  [94-101] 94 (11/28 0457) Resp:  [17] 17 (11/28 0457) BP: (132-145)/(71-89) 145/77 (11/28 0457) SpO2:  [100 %] 100 % (11/28 0457)  Intake/Output from previous day: 11/27 0701 - 11/28 0700 In: 1471.3 [P.O.:880; I.V.:391.3; IV Piggyback:200] Out: 1850 [Urine:1850] Intake/Output this shift: No intake/output data recorded.  Labs: Recent Labs    01/22/24 1405 01/23/24 0323 01/24/24 0618 01/25/24 0326  HGB 10.9* 10.2* 10.4* 10.2*   Recent Labs    01/24/24 0618 01/25/24 0326  WBC 16.4* 14.7*  RBC 3.41* 3.41*  HCT 33.0* 32.4*  PLT 382 389   Recent Labs    01/24/24 0618 01/25/24 0326  NA 137 138  K 4.8 4.8  CL 104 105  CO2 22 25  BUN 52* 41*  CREATININE 1.30* 1.19  GLUCOSE 117* 116*  CALCIUM  9.2 8.8*   No results for input(s): LABPT, INR in the last 72 hours.  Physical Exam: General - Patient is Alert and Oriented Extremity - Neurologically intact Neurovascular intact Sensation intact distally Dorsiflexion/Plantar flexion intact Dressing/Incision - well healing arthroplasty incision with staples intact. There is a small amount of blanchable erythema. No overt warmth. Mild swelling.  Motor Function - intact, moving foot and toes well on exam.  Body mass index is 26.07 kg/m.   Assessment/Plan:   William Hatfield is a very pleasant 78 year old male who presented with left leg cellulitis. Hx of HTN, CAD, PAF, Stage 3a CKD. Overall, from an orthopedic standpoint he is doing well. His left knee incision is healing well with some redness but no warmth or signs of infection.   Plan: Continue Care Continue pain medical management  Continue Incentive spirometry  Continue mobilization with PT--PT  did not see yesterday due to IV heparin  Continue to ice surgical site WBAT IV Herparin for DVT prophylaxis HGB today was 10.2     [employer]  [facility]  [phone] 01/25/2024, 7:56 AM

## 2024-01-25 NOTE — Progress Notes (Signed)
 When I went to round on the patient the patient was gone for his VQ scan.  I discussed the patient with Dr. Sheena and there did not appear to be any significant changes in the patient's chart or lab results.  Plan for general cardiology to follow-up with the patient tomorrow.  Signed,  Morse Clause, PA-C 01/25/2024, 5:14 PM

## 2024-01-25 NOTE — Progress Notes (Signed)
 PHARMACY - ANTICOAGULATION CONSULT NOTE  Pharmacy Consult for Heparin  Indication: afib, r/o new PE  Allergies  Allergen Reactions   Niaspan [Niacin] Hives, Itching and Other (See Comments)    [facility]AN ONLY    Patient Measurements: Height: 5' 11 (180.3 cm) Weight: 84.8 kg (186 lb 15.2 oz) IBW/kg (Calculated) : 75.3 HEPARIN  DW (KG): 84.8  Vital Signs: Temp: 98.1 F (36.7 C) (11/28 0457) BP: 145/77 (11/28 0457) Pulse Rate: 94 (11/28 0457)  Labs: Recent Labs    01/23/24 0323 01/24/24 0618 01/24/24 1424 01/25/24 0326 01/25/24 0329 01/25/24 0333  HGB 10.2* 10.4*  --  10.2*  --   --   HCT 32.4* 33.0*  --  32.4*  --   --   PLT 285 382  --  389  --   --   APTT  --  42* 66*  --  85*  --   HEPARINUNFRC  --  >1.10*  --   --   --  1.10*  CREATININE 1.60* 1.30*  --  1.19  --   --     Estimated Creatinine Clearance: 56.2 mL/min (by C-G formula based on SCr of 1.19 mg/dL).   Medical History: Past Medical History:  Diagnosis Date   Anginal pain    Arthritis    Atrial fibrillation (HCC)    a. post op in 2006  b. recurrence on 08/2016 admission for NSTEMI   CAD (coronary artery disease)    a. 2006: s/p CABG at [facility] with LIMA to LAD, SVG to PDA, SVG to OM1, SVG to D1  b. 08/2016: NSTEMI 09/18/16 which showed severe native 3V CAD with patent SVG-->PLA, LIMA-->LAD and total occlusion of SVG--> OM1 with unsuccessful attempt at PCI of SVG--> OM with inability to restore flow.    Cancer (HCC)    Rt. arm,head squamus.   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 Now on IV heparin  for ?PE. Ddimer 1.49 elevated, Increased SOB and chest discomfort with  elevated troponins also. - 11/26: Dopplers negative - 11/26 PM CT with PNA - Hep level 1.1 elevated as expected on Eliquis , aPTT 85 in goal.Hgb 10.2 stable. Plts WNL  Goal of Therapy:  aPTT 66-102 seconds Monitor platelets by anticoagulation protocol: Yes   Plan:  Con't IV heparin  1450 units/hr Daily aPTT, Heparin  level, and CBC   Ozelle Brubacher Karoline Marina, PharmD, BCPS Clinical Staff Pharmacist Marina Salines Stillinger 01/25/2024,7:14 AM

## 2024-01-25 NOTE — Progress Notes (Signed)
 PT Cancellation Note  Patient Details Name: Hero Kulish MRN: [id number] DOB: [birth year]   Cancelled Treatment:     Per chart review, Heparin  level of 1.10 and APTT of 86 are both abnormal lab values that are both outside our parameters. Pt is a HIGH risk for bleeding (Heparin )and HIGH risk inability to clot(APTT).  Secure chat sent to DO to confirm to hold of Physical Therapy and await Cardiologist workup.  Will update LPT and Rehab Team will continue to follow.  Katheryn Leap  PTA Acute  Rehabilitation Services Office M-F          [phone]

## 2024-01-25 NOTE — Plan of Care (Signed)
  Problem: Skin Integrity: Goal: Skin integrity will improve Outcome: Progressing   Problem: Clinical Measurements: Goal: Ability to avoid or minimize complications of infection will improve Outcome: Progressing   Problem: Safety: Goal: Ability to remain free from injury will improve Outcome: Progressing   Problem: Pain Managment: Goal: General experience of comfort will improve and/or be controlled Outcome: Progressing   Problem: Elimination: Goal: Will not experience complications related to urinary retention Outcome: Progressing

## 2024-01-25 NOTE — Progress Notes (Signed)
 PROGRESS NOTE    William Hatfield  FMW:[id number] DOB: 1945-10-09 DOA: 01/22/2024 PCP: Henriette Anes, DO     Brief Narrative:  William Hatfield is a 78 y.o. 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   Patient began feeling generally poor with nausea, fever, chills, and fatigue 2 or 3 days ago.  His daughter noted today that the left knee appeared red and swollen.  Patient has resumed full dose Eliquis  since the procedure.   Patient was admitted for suspected left lower extremity cellulitis and started on broad-spectrum antibiotics.  Orthopedic surgery evaluated patient, did not feel erythema was secondary to cellulitis.  Patient was ultimately diagnosed with pneumonia.  Due to complaints of chest pain and history of CAD, cardiology was consulted.  New events last 24 hours / Subjective: Continues to have some mild central chest pressure as well as continued shortness of breath.  Assessment & Plan:   Principal Problem:   HCAP (healthcare-associated pneumonia) Active Problems:   Hypertension   CAD (coronary artery disease)   Hx of CABG   Dyslipidemia   PAF (paroxysmal atrial fibrillation) (HCC)   Left leg cellulitis   Acute renal failure superimposed on stage 3a chronic kidney disease (HCC)   Left leg cellulitis, ruled out - Evaluated by orthopedic surgery, they do not feel that the erythema is from an infection, but rather expected postop changes - Venous duplex lower extremity negative for DVT  Pneumonia - Continue cefepime .  MRSA PCR negative   Elevated D-dimer - Unable to obtain CTA chest due to AKI - VQ scan ordered  Chest pain, with mildly elevated troponin History of CAD status post CABG - Continue Lipitor, Imdur , metoprolol  - Echocardiogram without wall  motion abnormality - Cardiology consulted - Currently on IV heparin   Complete heart block status post pacemaker in 2021  AKI on CKD 3A - Baseline creatinine 1.3 - Held losartan , Lasix , Jardiance  - Improved and now back to baseline  Paroxysmal A-fib - Eliquis  prior to admission, metoprolol     DVT prophylaxis: Eliquis  --> IV heparin  Code Status: Full code Family Communication: Daughter 11/27 Disposition Plan: Possible rehab, pending PT evaluation Status is: Inpatient Remains inpatient appropriate because: IV antibiotics and further workup    Antimicrobials:  Anti-infectives (From admission, onward)    Start     Dose/Rate Route Frequency Ordered Stop   01/25/24 0600  vancomycin  (VANCOREADY) IVPB 1500 mg/300 mL  Status:  Discontinued        1,500 mg 150 mL/hr over 120 Minutes Intravenous Every 24 hours 01/24/24 0742 01/24/24 1225   01/24/24 0600  vancomycin  (VANCOREADY) IVPB 1250 mg/250 mL  Status:  Discontinued        1,250 mg 166.7 mL/hr over 90 Minutes Intravenous Every 36 hours 01/22/24 1722 01/24/24 0741   01/23/24 1215  ceFEPIme  (MAXIPIME ) 2 g in sodium chloride  0.9 % 100 mL IVPB        2 g 200 mL/hr over 30 Minutes Intravenous Every 12 hours 01/23/24 1122 01/30/24 1214   01/22/24 2300  cefTRIAXone  (ROCEPHIN ) 1 g in sodium chloride  0.9 % 100 mL IVPB  Status:  Discontinued        1 g 200 mL/hr over 30 Minutes Intravenous Every 24 hours 01/22/24 1707 01/23/24 1122   01/22/24 1545  vancomycin  (VANCOCIN ) IVPB 1000 mg/200 mL premix  1,000 mg 200 mL/hr over 60 Minutes Intravenous  Once 01/22/24 1539 01/22/24 1840   01/22/24 1545  ceFAZolin  (ANCEF ) IVPB 1 g/50 mL premix        1 g 100 mL/hr over 30 Minutes Intravenous  Once 01/22/24 1539 01/22/24 1621        Objective: Vitals:   01/24/24 0917 01/24/24 1446 01/24/24 2053 01/25/24 0457  BP: 132/89 136/71 136/71 (!) 145/77  Pulse: 100 95 (!) 101 94  Resp:  17 17 17   Temp: 98.2 F (36.8 C) 98.1 F (36.7 C)  98.3 F (36.8 C) 98.1 F (36.7 C)  TempSrc: Oral     SpO2:  100% 100% 100%  Weight:      Height:        Intake/Output Summary (Last 24 hours) at 01/25/2024 1013 Last data filed at 01/25/2024 0900 Gross per 24 hour  Intake 1491.25 ml  Output 1800 ml  Net -308.75 ml   Filed Weights   01/22/24 1703  Weight: 84.8 kg    Examination:  General exam: Appears calm and comfortable  Respiratory system: Crackles left midlung field. Respiratory effort normal. No respiratory distress. No conversational dyspnea.  On nasal cannula O2 Cardiovascular system: S1 & S2 heard, RRR. No murmurs. Gastrointestinal system: Abdomen is nondistended Central nervous system: Alert and oriented. No focal neurological deficits. Speech clear.  Extremities: Incision over the left knee is clean, staples are intact.  Mild erythema.  No drainage from the incision Psychiatry: Judgement and insight appear normal. Mood & affect appropriate.   Data Reviewed: I have personally reviewed following labs and imaging studies  CBC: Recent Labs  Lab 01/22/24 1405 01/23/24 0323 01/24/24 0618 01/25/24 0326  WBC 13.2* 12.7* 16.4* 14.7*  NEUTROABS 10.5* 9.5*  --   --   HGB 10.9* 10.2* 10.4* 10.2*  HCT 33.5* 32.4* 33.0* 32.4*  MCV 92.3 95.0 96.8 95.0  PLT 317 285 382 389   Basic Metabolic Panel: Recent Labs  Lab 01/22/24 1405 01/23/24 0323 01/24/24 0618 01/25/24 0326  NA 137 137 137 138  K 5.1 5.3* 4.8 4.8  CL 98 102 104 105  CO2 24 24 22 25   GLUCOSE 134* 110* 117* 116*  BUN 70* 64* 52* 41*  CREATININE 1.71* 1.60* 1.30* 1.19  CALCIUM  9.7 9.1 9.2 8.8*   GFR: Estimated Creatinine Clearance: 56.2 mL/min (by C-G formula based on SCr of 1.19 mg/dL). Liver Function Tests: Recent Labs  Lab 01/22/24 1405 01/23/24 0323  AST 41 45*  ALT 63* 53*  ALKPHOS 206* 206*  BILITOT 0.8 0.7  PROT 7.2 6.5  ALBUMIN 3.7 3.2*   No results for input(s): LIPASE, AMYLASE in the last 168 hours. No results for input(s):  AMMONIA in the last 168 hours. Coagulation Profile: No results for input(s): INR, PROTIME in the last 168 hours. Cardiac Enzymes: No results for input(s): CKTOTAL, CKMB, CKMBINDEX, TROPONINI in the last 168 hours. BNP (last 3 results) No results for input(s): PROBNP in the last 8760 hours. HbA1C: No results for input(s): HGBA1C in the last 72 hours. CBG: No results for input(s): GLUCAP in the last 168 hours. Lipid Profile: Recent Labs    01/25/24 0326  CHOL 71  HDL 28*  LDLCALC 31  TRIG 60  CHOLHDL 2.6   Thyroid Function Tests: No results for input(s): TSH, T4TOTAL, FREET4, T3FREE, THYROIDAB in the last 72 hours. Anemia Panel: No results for input(s): VITAMINB12, FOLATE, FERRITIN, TIBC, IRON, RETICCTPCT in the last 72 hours. Sepsis Labs: Recent Labs  Lab 01/22/24 1618  LATICACIDVEN 1.7    Recent Results (from the past 240 hours)  Culture, blood (routine x 2)     Status: None (Preliminary result)   Collection Time: 01/22/24  4:10 PM   Specimen: BLOOD  Result Value Ref Range Status   Specimen Description   Final    BLOOD LEFT ANTECUBITAL Performed at [facility], [address] [place], [place] [address]    Special Requests   Final    BOTTLES DRAWN AEROBIC AND ANAEROBIC Blood Culture adequate volume Performed at [facility], [address], [place], [place] [address]    Culture   Final    NO GROWTH 2 DAYS Performed at [facility], [address], [place], [place] [address]    Report Status PENDING  Incomplete  MRSA Next Gen by PCR, Nasal     Status: None   Collection Time: 01/24/24  8:12 AM   Specimen: Nasal Mucosa; Nasal Swab  Result Value Ref Range Status   MRSA by PCR Next Gen NOT DETECTED NOT DETECTED Final    Comment: (NOTE) The GeneXpert MRSA Assay (FDA approved for NASAL specimens only), is one component of a comprehensive MRSA colonization surveillance program. It  is not intended to diagnose MRSA infection nor to guide or monitor treatment for MRSA infections. Test performance is not FDA approved in patients less than 9 years old. Performed at [facility], [address] [place], [place] [address]       Radiology Studies: DG CHEST PORT 1 VIEW Result Date: 01/25/2024 CLINICAL DATA:  Elevated D-dimer. EXAM: PORTABLE CHEST 1 VIEW COMPARISON:  01/22/2024 FINDINGS: The heart size and mediastinal contours are within normal limits. Pacemaker again seen. Prior CABG again noted. Low lung volumes are again seen. New opacity is seen in the left perihilar region and mid lung the which may be due to atelectasis or infiltrate. No significant pleural effusion. IMPRESSION: New left perihilar and midlung atelectasis or infiltrate. Electronically Signed   By: Norleen DELENA Kil M.D.   On: 01/25/2024 08:46   ECHOCARDIOGRAM COMPLETE Result Date: 01/24/2024    ECHOCARDIOGRAM REPORT   Patient Name:   William Hatfield Date of Exam: 01/24/2024 Medical Rec #:  [id number]   Height:       71.0 in Accession #:    [id number]  Weight:       186.9 lb Date of Birth:  05-Aug-1945   BSA:          2.049 m Patient Age:    77 years    BP:           162/84 mmHg Patient Gender: M           HR:           102 bpm. Exam Location:  Inpatient Procedure: 2D Echo, Cardiac Doppler and Color Doppler (Both Spectral and Color            Flow Doppler were utilized during procedure). Indications:    Elevated Troponin  History:        Patient has prior history of Echocardiogram examinations, most                 recent 01/01/2020. CAD and Previous Myocardial Infarction; Risk                 Factors:Hypertension.  Sonographer:    [name] Gaskins Referring Phys: [id number] ABIGAIL CHAVEZ IMPRESSIONS  1. Left ventricular ejection fraction, by estimation,  is 50 to 55%. The left ventricle has low normal function. The left ventricle has no regional wall motion abnormalities. There is severe concentric left  ventricular hypertrophy. Left ventricular diastolic parameters are indeterminate.  2. Right ventricular systolic function is normal. The right ventricular size is normal.  3. The mitral valve is normal in structure. Mild to moderate mitral valve regurgitation. No evidence of mitral stenosis.  4. The aortic valve has an indeterminant number of cusps. Aortic valve regurgitation is not visualized. No aortic stenosis is present. FINDINGS  Left Ventricle: Left ventricular ejection fraction, by estimation, is 50 to 55%. The left ventricle has low normal function. The left ventricle has no regional wall motion abnormalities. The left ventricular internal cavity size was normal in size. There is severe concentric left ventricular hypertrophy. Left ventricular diastolic parameters are indeterminate. Right Ventricle: The right ventricular size is normal. No increase in right ventricular wall thickness. Right ventricular systolic function is normal. Left Atrium: Left atrial size was normal in size. Right Atrium: Right atrial size was normal in size. Pericardium: There is no evidence of pericardial effusion. Mitral Valve: The mitral valve is normal in structure. Mild mitral annular calcification. Mild to moderate mitral valve regurgitation. No evidence of mitral valve stenosis. Tricuspid Valve: The tricuspid valve is normal in structure. Tricuspid valve regurgitation is mild . No evidence of tricuspid stenosis. Aortic Valve: The aortic valve has an indeterminant number of cusps. Aortic valve regurgitation is not visualized. No aortic stenosis is present. Aortic valve mean gradient measures 4.0 mmHg. Aortic valve peak gradient measures 7.1 mmHg. Aortic valve area, by VTI measures 3.51 cm. Pulmonic Valve: The pulmonic valve was not well visualized. Pulmonic valve regurgitation is trivial. No evidence of pulmonic stenosis. Aorta: The aortic root is normal in size and structure. Venous: The inferior vena cava was not well  visualized. IAS/Shunts: No atrial level shunt detected by color flow Doppler.  LEFT VENTRICLE PLAX 2D LVIDd:         3.70 cm LVIDs:         2.70 cm LV PW:         1.50 cm LV IVS:        1.70 cm LVOT diam:     2.00 cm LV SV:         78 LV SV Index:   38 LVOT Area:     3.14 cm  RIGHT VENTRICLE RV S prime:     11.00 cm/s TAPSE (M-mode): 1.8 cm LEFT ATRIUM             Index        RIGHT ATRIUM           Index LA Vol (A2C):   65.1 ml 31.77 ml/m  RA Area:     18.40 cm LA Vol (A4C):   44.8 ml 21.86 ml/m  RA Volume:   49.20 ml  24.01 ml/m LA Biplane Vol: 56.2 ml 27.43 ml/m  AORTIC VALVE AV Area (Vmax):    3.09 cm AV Area (Vmean):   3.10 cm AV Area (VTI):     3.51 cm AV Vmax:           133.00 cm/s AV Vmean:          99.800 cm/s AV VTI:            0.223 m AV Peak Grad:      7.1 mmHg AV Mean Grad:      4.0 mmHg LVOT Vmax:  131.00 cm/s LVOT Vmean:        98.500 cm/s LVOT VTI:          0.249 m LVOT/AV VTI ratio: 1.12  AORTA Ao Root diam: 3.10 cm MITRAL VALVE MV Area (PHT): 3.24 cm     SHUNTS MV E velocity: 140.00 cm/s  Systemic VTI:  0.25 m                             Systemic Diam: 2.00 cm Kardie Tobb DO Electronically signed by Dub Huntsman DO Signature Date/Time: 01/24/2024/9:35:31 AM    Final    CT CHEST WO CONTRAST Result Date: 01/23/2024 EXAM: CT CHEST WITHOUT CONTRAST 01/23/2024 09:12:00 PM TECHNIQUE: CT of the chest was performed without the administration of intravenous contrast. Multiplanar reformatted images are provided for review. Automated exposure control, iterative reconstruction, and/or weight based adjustment of the mA/kV was utilized to reduce the radiation dose to as low as reasonably achievable. COMPARISON: Comparison with chest radiograph 01/22/2024. CLINICAL HISTORY: Pneumonia, complication suspected, xray done. FINDINGS: MEDIASTINUM: Cardiac pacemaker. Postoperative changes in the mediastinum consistent with coronary bypass. Calcification in the aorta and coronary arteries. The central  airways are clear. LYMPH NODES: No mediastinal, hilar or axillary lymphadenopathy. LUNGS AND PLEURA: Emphysematous changes in the lungs. Small bilateral pleural effusions with basilar atelectasis or consolidation, greater on the left. Patchy infiltrates are demonstrated focally in the upper lungs and left lingula, likely multifocal pneumonia. No pneumothorax. SOFT TISSUES/BONES: Degenerative changes in the spine. Sternotomy wires. UPPER ABDOMEN: Mild infiltration and stranding around the tail of the pancreas may indicate early changes of acute pancreatitis. No loculated collection. IMPRESSION: 1. Findings consistent with multifocal pneumonia, with patchy infiltrates in the upper lungs and left lingula, and basilar atelectasis or consolidation greater on the left. 2. Small bilateral pleural effusions. 3. Emphysematous changes in the lungs. 4. Mild infiltration and stranding around the tail of the pancreas, possibly indicating early changes of acute pancreatitis. No loculated collection. Electronically signed by: Elsie Gravely MD 01/23/2024 09:26 PM EST RP Workstation: [id number]      Scheduled Meds:  allopurinol   100 mg Oral Q0600   atorvastatin   40 mg Oral QPM   isosorbide  mononitrate  30 mg Oral Daily   lactulose   20 g Oral BID   metoprolol  tartrate  25 mg Oral BID   sodium chloride  flush  3 mL Intravenous Q12H   Continuous Infusions:  ceFEPime  (MAXIPIME ) IV 2 g (01/24/24 2339)   heparin  1,450 Units/hr (01/24/24 1607)     LOS: 3 days   Time spent: 25 minutes   Delon Hoe, DO [facility] 01/25/2024, 10:13 AM   Available via Epic secure chat 7am-7pm After these hours, please refer to coverage provider listed on [email or web address]

## 2024-01-26 ENCOUNTER — Ambulatory Visit: Attending: Family Medicine

## 2024-01-26 DIAGNOSIS — I2510 Atherosclerotic heart disease of native coronary artery without angina pectoris: Secondary | ICD-10-CM | POA: Diagnosis not present

## 2024-01-26 DIAGNOSIS — E785 Hyperlipidemia, unspecified: Secondary | ICD-10-CM | POA: Diagnosis not present

## 2024-01-26 DIAGNOSIS — J189 Pneumonia, unspecified organism: Secondary | ICD-10-CM | POA: Diagnosis not present

## 2024-01-26 DIAGNOSIS — I10 Essential (primary) hypertension: Secondary | ICD-10-CM | POA: Diagnosis not present

## 2024-01-26 LAB — BASIC METABOLIC PANEL WITH GFR
Anion gap: 8 (ref 5–15)
BUN: 36 mg/dL — ABNORMAL HIGH (ref 8–23)
CO2: 22 mmol/L (ref 22–32)
Calcium: 8.5 mg/dL — ABNORMAL LOW (ref 8.9–10.3)
Chloride: 106 mmol/L (ref 98–111)
Creatinine, Ser: 1.09 mg/dL (ref 0.61–1.24)
GFR, Estimated: >60 mL/min (ref >60)
Glucose, Bld: 128 mg/dL — ABNORMAL HIGH (ref 70–99)
Potassium: 4.6 mmol/L (ref 3.5–5.1)
Sodium: 136 mmol/L (ref 135–145)

## 2024-01-26 LAB — CBC
HCT: 29.6 % — ABNORMAL LOW (ref 39.0–52.0)
Hemoglobin: 9.4 g/dL — ABNORMAL LOW (ref 13.0–17.0)
MCH: 29.9 pg (ref 26.0–34.0)
MCHC: 31.8 g/dL (ref 30.0–36.0)
MCV: 94.3 fL (ref 80.0–100.0)
Platelets: 371 K/uL (ref 150–400)
RBC: 3.14 MIL/uL — ABNORMAL LOW (ref 4.22–5.81)
RDW: 14.9 % (ref 11.5–15.5)
WBC: 13.8 K/uL — ABNORMAL HIGH (ref 4.0–10.5)
nRBC: 0 % (ref 0.0–0.2)

## 2024-01-26 LAB — HEPARIN LEVEL (UNFRACTIONATED): Heparin Unfractionated: 1 [IU]/mL — ABNORMAL HIGH (ref 0.30–0.70)

## 2024-01-26 LAB — APTT: aPTT: 86 s — ABNORMAL HIGH (ref 24–36)

## 2024-01-26 MED ORDER — ALUM & MAG HYDROXIDE-SIMETH 200-200-20 MG/5ML PO SUSP
30.0000 mL | Freq: Four times a day (QID) | ORAL | Status: DC | PRN
  Administered 2024-01-26 (×4): 30 mL via ORAL
  Filled 2024-01-26 (×4): qty 30

## 2024-01-26 MED ORDER — APIXABAN 5 MG PO TABS
5.0000 mg | ORAL_TABLET | Freq: Two times a day (BID) | ORAL | Status: DC
  Administered 2024-01-26 – 2024-01-30 (×8): 5 mg via ORAL
  Filled 2024-01-26 (×9): qty 1

## 2024-01-26 NOTE — Plan of Care (Signed)
  Problem: Coping: Goal: Level of anxiety will decrease Outcome: Progressing   Problem: Elimination: Goal: Will not experience complications related to bowel motility Outcome: Progressing Goal: Will not experience complications related to urinary retention Outcome: Progressing   Problem: Pain Managment: Goal: General experience of comfort will improve and/or be controlled Outcome: Progressing

## 2024-01-26 NOTE — Plan of Care (Signed)

## 2024-01-26 NOTE — Progress Notes (Signed)
 DAILY PROGRESS NOTE   Patient Name: William Hatfield Date of Encounter: 01/26/2024 Cardiologist: [name] Sierras, MD  Chief Complaint   No chest pain overnight  Patient Profile   William Hatfield is a 77 y.o. male with a history of CAD s/p CABG x4 (LIMA-LAD, SVG-PDA, SVG-OM1, SVG-D1) in 2006, paroxysmal atrial fibrillation on Eliquis , complete heart block s/p PPM in 12/2019, carotid artery disease s/p CEA of left ICA in 04/2022 and TCAR of right ICA in 09/2022, hypertension, hyperlipidemia, CKD stage IIIa,  who is being seen 01/24/2024 for the evaluation of elevated troponin at the request of Dr. Rojelio.   Subjective   V/Q scan was low probability for PE yesterday. Echo showed low normal LVEF 50-55% but severe LVH and mild to moderate MR. Trop flat elevated at 348 >> 350. LDL 31. H/H 9.4 and 29.6. CT suggested multifocal pneumonia.   Objective   Vitals:   01/25/24 2043 01/25/24 2148 01/26/24 0514 01/26/24 0820  BP: 120/61  134/78 (!) 149/77  Pulse: 60 80 92 90  Resp: 15  15 18   Temp: 97.6 F (36.4 C)  97.8 F (36.6 C) 97.8 F (36.6 C)  TempSrc: Oral  Oral Oral  SpO2: 98%  99% 98%  Weight:      Height:        Intake/Output Summary (Last 24 hours) at 01/26/2024 0836 Last data filed at 01/26/2024 9173 Gross per 24 hour  Intake 1387.14 ml  Output 1210 ml  Net 177.14 ml   Filed Weights   01/22/24 1703  Weight: 84.8 kg    Physical Exam   General appearance: alert and no distress Lungs: diminished breath sounds bibasilar Heart: regular rate and rhythm Extremities: edema trace RLE and L knee post-op Neurologic: Grossly normal  Inpatient Medications    Scheduled Meds:  allopurinol   100 mg Oral Q0600   atorvastatin   40 mg Oral QPM   isosorbide  mononitrate  30 mg Oral Daily   lactulose   20 g Oral BID   metoprolol  tartrate  25 mg Oral BID   sodium chloride  flush  3 mL Intravenous Q12H    Continuous Infusions:  ceFEPime  (MAXIPIME ) IV 2 g (01/26/24 0019)   heparin  1,450  Units/hr (01/26/24 0518)    PRN Meds: acetaminophen  **OR** acetaminophen , alum & mag hydroxide-simeth, HYDROmorphone , methocarbamol , oxyCODONE , senna, trimethobenzamide    Labs   Results for orders placed or performed during the hospital encounter of 01/22/24 (from the past 48 hours)  APTT     Status: Abnormal   Collection Time: 01/24/24  2:24 PM  Result Value Ref Range   aPTT 66 (H) 24 - 36 seconds    Comment:        IF BASELINE aPTT IS ELEVATED, SUGGEST PATIENT RISK ASSESSMENT BE USED TO DETERMINE APPROPRIATE ANTICOAGULANT THERAPY. Performed at [facility], [address], [place], [place] [address]   Lipid panel     Status: Abnormal   Collection Time: 01/25/24  3:26 AM  Result Value Ref Range   Cholesterol 71 0 - 200 mg/dL    Comment:        ATP III CLASSIFICATION:  <200     mg/dL   Desirable  799-760  mg/dL   Borderline High  >=759    mg/dL   High           Triglycerides 60 <150 mg/dL   HDL 28 (L) >59 mg/dL   Total CHOL/HDL Ratio 2.6 RATIO   VLDL 12 0 - 40 mg/dL  LDL Cholesterol 31 0 - 99 mg/dL    Comment:        Total Cholesterol/HDL:CHD Risk Coronary Heart Disease Risk Table                     Men   Women  1/2 Average Risk   3.4   3.3  Average Risk       5.0   4.4  2 X Average Risk   9.6   7.1  3 X Average Risk  23.4   11.0        Use the calculated Patient Ratio above and the CHD Risk Table to determine the patient's CHD Risk.        ATP III CLASSIFICATION (LDL):  <100     mg/dL   Optimal  899-870  mg/dL   Near or Above                    Optimal  130-159  mg/dL   Borderline  839-810  mg/dL   High  >809     mg/dL   Very High Performed at [facility], [address], [place], [place] [address]   Basic metabolic panel with GFR     Status: Abnormal   Collection Time: 01/25/24  3:26 AM  Result Value Ref Range   Sodium 138 135 - 145 mmol/L   Potassium 4.8 3.5 - 5.1 mmol/L   Chloride 105 98 - 111 mmol/L   CO2 25  22 - 32 mmol/L   Glucose, Bld 116 (H) 70 - 99 mg/dL    Comment: Glucose reference range applies only to samples taken after fasting for at least 8 hours.   BUN 41 (H) 8 - 23 mg/dL   Creatinine, Ser 8.80 0.61 - 1.24 mg/dL   Calcium  8.8 (L) 8.9 - 10.3 mg/dL   GFR, Estimated >39 >39 mL/min    Comment: (NOTE) Calculated using the CKD-EPI Creatinine Equation (2021)    Anion gap 8 5 - 15    Comment: Performed at [facility], [address], [place], [place] [address]  CBC     Status: Abnormal   Collection Time: 01/25/24  3:26 AM  Result Value Ref Range   WBC 14.7 (H) 4.0 - 10.5 K/uL   RBC 3.41 (L) 4.22 - 5.81 MIL/uL   Hemoglobin 10.2 (L) 13.0 - 17.0 g/dL   HCT 67.5 (L) 60.9 - 47.9 %   MCV 95.0 80.0 - 100.0 fL   MCH 29.9 26.0 - 34.0 pg   MCHC 31.5 30.0 - 36.0 g/dL   RDW 85.1 88.4 - 84.4 %   Platelets 389 150 - 400 K/uL   nRBC 0.0 0.0 - 0.2 %    Comment: Performed at [facility], [address] [place], [place] [address]  APTT     Status: Abnormal   Collection Time: 01/25/24  3:29 AM  Result Value Ref Range   aPTT 85 (H) 24 - 36 seconds    Comment:        IF BASELINE aPTT IS ELEVATED, SUGGEST PATIENT RISK ASSESSMENT BE USED TO DETERMINE APPROPRIATE ANTICOAGULANT THERAPY. Performed at [facility], [address], [place], [place] [address]   Heparin  level (unfractionated)     Status: Abnormal   Collection Time: 01/25/24  3:33 AM  Result Value Ref Range   Heparin  Unfractionated 1.10 (H) 0.30 - 0.70 IU/mL    Comment: (NOTE) The clinical reportable  range upper limit is being lowered to >1.10 to align with the FDA approved guidance for the current laboratory assay.  If heparin  results are below expected values, and patient dosage has  been confirmed, suggest follow up testing of antithrombin III levels. Performed at [facility], [address], [place], [place] [address]   Basic metabolic panel      Status: Abnormal   Collection Time: 01/26/24  3:02 AM  Result Value Ref Range   Sodium 136 135 - 145 mmol/L   Potassium 4.6 3.5 - 5.1 mmol/L   Chloride 106 98 - 111 mmol/L   CO2 22 22 - 32 mmol/L   Glucose, Bld 128 (H) 70 - 99 mg/dL    Comment: Glucose reference range applies only to samples taken after fasting for at least 8 hours.   BUN 36 (H) 8 - 23 mg/dL   Creatinine, Ser 8.90 0.61 - 1.24 mg/dL   Calcium  8.5 (L) 8.9 - 10.3 mg/dL   GFR, Estimated >39 >39 mL/min    Comment: (NOTE) Calculated using the CKD-EPI Creatinine Equation (2021)    Anion gap 8 5 - 15    Comment: Performed at [facility], [address], [place], [place] [address]  CBC     Status: Abnormal   Collection Time: 01/26/24  3:02 AM  Result Value Ref Range   WBC 13.8 (H) 4.0 - 10.5 K/uL   RBC 3.14 (L) 4.22 - 5.81 MIL/uL   Hemoglobin 9.4 (L) 13.0 - 17.0 g/dL   HCT 70.3 (L) 60.9 - 47.9 %   MCV 94.3 80.0 - 100.0 fL   MCH 29.9 26.0 - 34.0 pg   MCHC 31.8 30.0 - 36.0 g/dL   RDW 85.0 88.4 - 84.4 %   Platelets 371 150 - 400 K/uL   nRBC 0.0 0.0 - 0.2 %    Comment: Performed at [facility], [address], [place], [place] [address]  Heparin  level (unfractionated)     Status: Abnormal   Collection Time: 01/26/24  3:02 AM  Result Value Ref Range   Heparin  Unfractionated 1.00 (H) 0.30 - 0.70 IU/mL    Comment: (NOTE) The clinical reportable range upper limit is being lowered to >1.10 to align with the FDA approved guidance for the current laboratory assay.  If heparin  results are below expected values, and patient dosage has  been confirmed, suggest follow up testing of antithrombin III levels. Performed at [facility], [address], [place], [place] [address]   APTT     Status: Abnormal   Collection Time: 01/26/24  3:02 AM  Result Value Ref Range   aPTT 86 (H) 24 - 36 seconds    Comment:        IF BASELINE aPTT IS ELEVATED, SUGGEST PATIENT RISK  ASSESSMENT BE USED TO DETERMINE APPROPRIATE ANTICOAGULANT THERAPY. Performed at [facility], [address], [place], [place] [address]     ECG   N/A  Telemetry   N/A  Radiology    NM Pulmonary Perfusion Result Date: 01/25/2024 EXAM: NM Lung Perfusion Scan. CLINICAL HISTORY: Pulmonary embolism (PE) suspected, low to intermediate prob, positive D-dimer. TECHNIQUE: Radiolabeled MAA was administered intravenously and planar images of the lungs were obtained in multiple projections. RADIOPHARMACEUTICAL: 4.15 millicurie TECHNETIUM TO 48M ALBUMIN  AGGREGATED. COMPARISON: CT chest radiograph same day. FINDINGS: PERFUSION: No wedge-shaped peripheral perfusion defect within left or right lung to suggest acute pulmonary embolism. Normal perfusion pattern. IMPRESSION: 1. No  perfusion defects to indicate pulmonary embolism. Electronically signed by: Norleen Boxer MD 01/25/2024 12:01 PM EST RP Workstation: [id number]   DG CHEST PORT 1 VIEW Result Date: 01/25/2024 CLINICAL DATA:  Elevated D-dimer. EXAM: PORTABLE CHEST 1 VIEW COMPARISON:  01/22/2024 FINDINGS: The heart size and mediastinal contours are within normal limits. Pacemaker again seen. Prior CABG again noted. Low lung volumes are again seen. New opacity is seen in the left perihilar region and mid lung the which may be due to atelectasis or infiltrate. No significant pleural effusion. IMPRESSION: New left perihilar and midlung atelectasis or infiltrate. Electronically Signed   By: Norleen DELENA Kil M.D.   On: 01/25/2024 08:46    Cardiac Studies   ECHOCARDIOGRAM REPORT       Patient Name:   William Hatfield Date of Exam: 01/24/2024  Medical Rec #:  [id number]   Height:       71.0 in  Accession #:    [id number]  Weight:       186.9 lb  Date of Birth:  1945/06/26   BSA:          2.049 m  Patient Age:    77 years    BP:           162/84 mmHg  Patient Gender: M           HR:           102 bpm.  Exam Location:  Inpatient    Procedure: 2D Echo, Cardiac Doppler and Color Doppler (Both Spectral and  Color            Flow Doppler were utilized during procedure).   Indications:    Elevated Troponin    History:        Patient has prior history of Echocardiogram examinations,  most                 recent 01/01/2020. CAD and Previous Myocardial Infarction;  Risk                 Factors:Hypertension.    Sonographer:    [name] Gaskins  Referring Phys: [id number] ABIGAIL CHAVEZ   IMPRESSIONS     1. Left ventricular ejection fraction, by estimation, is 50 to 55%. The  left ventricle has low normal function. The left ventricle has no regional  wall motion abnormalities. There is severe concentric left ventricular  hypertrophy. Left ventricular  diastolic parameters are indeterminate.   2. Right ventricular systolic function is normal. The right ventricular  size is normal.   3. The mitral valve is normal in structure. Mild to moderate mitral valve  regurgitation. No evidence of mitral stenosis.   4. The aortic valve has an indeterminant number of cusps. Aortic valve  regurgitation is not visualized. No aortic stenosis is present.   Assessment   Principal Problem:   HCAP (healthcare-associated pneumonia) Active Problems:   Hypertension   CAD (coronary artery disease)   Hx of CABG   Dyslipidemia   PAF (paroxysmal atrial fibrillation) (HCC)   Left leg cellulitis   Acute renal failure superimposed on stage 3a chronic kidney disease (HCC)   Plan   Elevated Troponin CAD s/p CABG Patient has a history of CAD s/p remote CABG x4 in 2006. LHC showed occluded SVG to OM1 but all other grafts were patent. He was admitted on 01/22/2024 for left lower extremity cellulitis and pneumonia after recent knee surgery. On 11/26, he reported worsening shortness of breath followed by chest discomfort. High-sensitivity troponin  elevated but flat at 348 >> 350. D-dimer elevated at 1.49.  - V/Q negative, can d/c IV heparin   today. - Low suspicion for ischemic chest pain, suspect troponin elevation is demand ischemia related to multifocal pneumonia   Paroxsymal Atrial Fibrillation - He is in sinus rhythm, continue Lopressor  25mg  twice daily.  - d/c heparin , transition to Eliquis    Complete Heart Block s/p PPM S/p PPM in 2021.  - Followed by Dr. Almetta as an outpatient.  - Spoke with daughter by phone today, she is concerned that he exhibits orthostatic symptoms as an outpatient - BP drops when standing, has been dizzy- may need to decrease BB   Hypertension BP mildly elevated at times but mostly well controlled.  - Continue Lopressor  25mg  twice daily and Imdur  30mg  daily. - Check orthostatics when he is able to mobilize, may need to decrease BB   Hyperlipidemia - Continue Lipitor 40mg  daily. - LDL 31   AKI on CKD Stage IIIa Creatinine 1.71 on admission. Recent baseline around 1.3.  - Creatinine improved to 1.09 today.   Otherwise, per primary team: - Left leg cellulitis - Pneumonia  Time Spent Directly with Patient:  I have spent a total of 35 minutes with the patient reviewing hospital notes, telemetry, EKGs, labs and examining the patient as well as establishing an assessment and plan that was discussed personally with the patient.  > 50% of time was spent in direct patient care.  Length of Stay:  LOS: 4 days   Vinie KYM Maxcy, MD, [facility], [place], FNLA, FACP  [place]  [facility]  Medical Director of the [facility] Lipid Disorders &  Cardiovascular Risk Reduction Clinic Diplomate of the American Board of Clinical Lipidology Attending Cardiologist  Direct Dial: [phone]  Fax: [phone]  Website:  [email or web address] Vinie [name] Maxcy 01/26/2024, 8:36 AM

## 2024-01-26 NOTE — Progress Notes (Signed)
 Shaan Rhoads  MRN: [id number] DOB/Age: [birth year] 78 y.o. [facility] Procedure:      Subjective: Patient reports improvements in his left lower extremity cellulitis.  He was just seen by cardiology.  Continues to complain of orthostasis when trying to mobilize.  Vital Signs Temp:  [97.6 F (36.4 C)-97.8 F (36.6 C)] 97.8 F (36.6 C) (11/29 0820) Pulse Rate:  [60-99] 90 (11/29 0820) Resp:  [15-18] 18 (11/29 0820) BP: (120-149)/(61-78) 149/77 (11/29 0820) SpO2:  [98 %-100 %] 98 % (11/29 0820)  Lab Results Recent Labs    01/25/24 0326 01/26/24 0302  WBC 14.7* 13.8*  HGB 10.2* 9.4*  HCT 32.4* 29.6*  PLT 389 371   BMET Recent Labs    01/25/24 0326 01/26/24 0302  NA 138 136  K 4.8 4.6  CL 105 106  CO2 25 22  GLUCOSE 116* 128*  BUN 41* 36*  CREATININE 1.19 1.09  CALCIUM  8.8* 8.5*   INR  Date Value Ref Range Status  10/02/2022 1.1 0.8 - 1.2 Final    Comment:    (NOTE) INR goal varies based on device and disease states. Performed at [facility], [address], [place], [place] [address]      Exam Staples are intact to the incision.  Some mild bruising is noted.  Areas of the erythema continued to resolve and he is neurovascularly intact distally.  Much less swelling overall.        Plan Continue care per hospitalist team and cardiology. Continue attempts at mobilization according to his tolerance.  Deloris Mittag PA-C  01/26/2024, 10:00 AM Contact # [phone]

## 2024-01-26 NOTE — Progress Notes (Signed)
 PROGRESS NOTE    Colston Pyle  FMW:[id number] DOB: Feb 07, 1946 DOA: 01/22/2024 PCP: Henriette Anes, DO     Brief Narrative:  Fuquan Wilson is a 78 y.o. 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   Patient began feeling generally poor with nausea, fever, chills, and fatigue 2 or 3 days ago.  His daughter noted today that the left knee appeared red and swollen.  Patient has resumed full dose Eliquis  since the procedure.   Patient was admitted for suspected left lower extremity cellulitis and started on broad-spectrum antibiotics.  Orthopedic surgery evaluated patient, did not feel erythema was secondary to cellulitis.  Patient was ultimately diagnosed with pneumonia.  Due to complaints of chest pain and history of CAD, cardiology was consulted.  New events last 24 hours / Subjective: Working with PT on my examination.  Admits to shortness of breath, was able to stand up with great effort.  Chest pressure seems to have improved.  Assessment & Plan:   Principal Problem:   HCAP (healthcare-associated pneumonia) Active Problems:   Hypertension   CAD (coronary artery disease)   Hx of CABG   Dyslipidemia   PAF (paroxysmal atrial fibrillation) (HCC)   Left leg cellulitis   Acute renal failure superimposed on stage 3a chronic kidney disease (HCC)   Left leg cellulitis, ruled out - Evaluated by orthopedic surgery, they do not feel that the erythema is from an infection, but rather expected postop changes - Venous duplex lower extremity negative for DVT  HCAP  - Continue cefepime .  MRSA PCR negative - WBC continues to improve  Elevated D-dimer - Unable to obtain CTA chest due to AKI - VQ scan negative  Chest pain, with mildly elevated troponin History of CAD status  post CABG - Continue Lipitor, Imdur , metoprolol  - Echocardiogram without wall motion abnormality - Cardiology following  Complete heart block status post pacemaker in 2021  AKI on CKD 3A - Baseline creatinine 1.3 - Held losartan , Lasix , Jardiance  - Improved and now back to baseline  Paroxysmal A-fib - Eliquis     DVT prophylaxis: Eliquis   Code Status: Full code Family Communication: None at bedside today Disposition Plan: Possible rehab, pending PT evaluation Status is: Inpatient Remains inpatient appropriate because: IV antibiotics, possible rehab  Antimicrobials:  Anti-infectives (From admission, onward)    Start     Dose/Rate Route Frequency Ordered Stop   01/25/24 0600  vancomycin  (VANCOREADY) IVPB 1500 mg/300 mL  Status:  Discontinued        1,500 mg 150 mL/hr over 120 Minutes Intravenous Every 24 hours 01/24/24 0742 01/24/24 1225   01/24/24 0600  vancomycin  (VANCOREADY) IVPB 1250 mg/250 mL  Status:  Discontinued        1,250 mg 166.7 mL/hr over 90 Minutes Intravenous Every 36 hours 01/22/24 1722 01/24/24 0741   01/23/24 1215  ceFEPIme  (MAXIPIME ) 2 g in sodium chloride  0.9 % 100 mL IVPB        2 g 200 mL/hr over 30 Minutes Intravenous Every 12 hours 01/23/24 1122 01/30/24 1214   01/22/24 2300  cefTRIAXone  (ROCEPHIN ) 1 g in sodium chloride  0.9 % 100 mL IVPB  Status:  Discontinued        1 g 200 mL/hr over 30 Minutes Intravenous Every 24 hours 01/22/24 1707 01/23/24 1122   01/22/24 1545  vancomycin  (VANCOCIN ) IVPB  1000 mg/200 mL premix        1,000 mg 200 mL/hr over 60 Minutes Intravenous  Once 01/22/24 1539 01/22/24 1840   01/22/24 1545  ceFAZolin  (ANCEF ) IVPB 1 g/50 mL premix        1 g 100 mL/hr over 30 Minutes Intravenous  Once 01/22/24 1539 01/22/24 1621        Objective: Vitals:   01/25/24 2043 01/25/24 2148 01/26/24 0514 01/26/24 0820  BP: 120/61  134/78 (!) 149/77  Pulse: 60 80 92 90  Resp: 15  15 18   Temp: 97.6 F (36.4 C)  97.8 F (36.6 C) 97.8 F  (36.6 C)  TempSrc: Oral  Oral Oral  SpO2: 98%  99% 98%  Weight:      Height:        Intake/Output Summary (Last 24 hours) at 01/26/2024 1129 Last data filed at 01/26/2024 9173 Gross per 24 hour  Intake 1267.14 ml  Output 1010 ml  Net 257.14 ml   Filed Weights   01/22/24 1703  Weight: 84.8 kg    Examination:  General exam: Appears calm, with dyspnea on exertion Extremities: Incision over the left knee is clean, staples are intact.  Mild erythema.  No drainage from the incision Psychiatry: Judgement and insight appear normal. Mood & affect appropriate.   Data Reviewed: I have personally reviewed following labs and imaging studies  CBC: Recent Labs  Lab 01/22/24 1405 01/23/24 0323 01/24/24 0618 01/25/24 0326 01/26/24 0302  WBC 13.2* 12.7* 16.4* 14.7* 13.8*  NEUTROABS 10.5* 9.5*  --   --   --   HGB 10.9* 10.2* 10.4* 10.2* 9.4*  HCT 33.5* 32.4* 33.0* 32.4* 29.6*  MCV 92.3 95.0 96.8 95.0 94.3  PLT 317 285 382 389 371   Basic Metabolic Panel: Recent Labs  Lab 01/22/24 1405 01/23/24 0323 01/24/24 0618 01/25/24 0326 01/26/24 0302  NA 137 137 137 138 136  K 5.1 5.3* 4.8 4.8 4.6  CL 98 102 104 105 106  CO2 24 24 22 25 22   GLUCOSE 134* 110* 117* 116* 128*  BUN 70* 64* 52* 41* 36*  CREATININE 1.71* 1.60* 1.30* 1.19 1.09  CALCIUM  9.7 9.1 9.2 8.8* 8.5*   GFR: Estimated Creatinine Clearance: 61.4 mL/min (by C-G formula based on SCr of 1.09 mg/dL). Liver Function Tests: Recent Labs  Lab 01/22/24 1405 01/23/24 0323  AST 41 45*  ALT 63* 53*  ALKPHOS 206* 206*  BILITOT 0.8 0.7  PROT 7.2 6.5  ALBUMIN 3.7 3.2*   No results for input(s): LIPASE, AMYLASE in the last 168 hours. No results for input(s): AMMONIA in the last 168 hours. Coagulation Profile: No results for input(s): INR, PROTIME in the last 168 hours. Cardiac Enzymes: No results for input(s): CKTOTAL, CKMB, CKMBINDEX, TROPONINI in the last 168 hours. BNP (last 3 results) No results  for input(s): PROBNP in the last 8760 hours. HbA1C: No results for input(s): HGBA1C in the last 72 hours. CBG: No results for input(s): GLUCAP in the last 168 hours. Lipid Profile: Recent Labs    01/25/24 0326  CHOL 71  HDL 28*  LDLCALC 31  TRIG 60  CHOLHDL 2.6   Thyroid Function Tests: No results for input(s): TSH, T4TOTAL, FREET4, T3FREE, THYROIDAB in the last 72 hours. Anemia Panel: No results for input(s): VITAMINB12, FOLATE, FERRITIN, TIBC, IRON, RETICCTPCT in the last 72 hours. Sepsis Labs: Recent Labs  Lab 01/22/24 1618  LATICACIDVEN 1.7    Recent Results (from the past 240 hours)  Culture, blood (  routine x 2)     Status: None (Preliminary result)   Collection Time: 01/22/24  4:10 PM   Specimen: BLOOD  Result Value Ref Range Status   Specimen Description   Final    BLOOD LEFT ANTECUBITAL Performed at [facility], [address] [place], [place] [address]    Special Requests   Final    BOTTLES DRAWN AEROBIC AND ANAEROBIC Blood Culture adequate volume Performed at [facility], [address], [place], [place] [address]    Culture   Final    NO GROWTH 4 DAYS Performed at [facility], [address] [place], [place] [address]    Report Status PENDING  Incomplete  MRSA Next Gen by PCR, Nasal     Status: None   Collection Time: 01/24/24  8:12 AM   Specimen: Nasal Mucosa; Nasal Swab  Result Value Ref Range Status   MRSA by PCR Next Gen NOT DETECTED NOT DETECTED Final    Comment: (NOTE) The GeneXpert MRSA Assay (FDA approved for NASAL specimens only), is one component of a comprehensive MRSA colonization surveillance program. It is not intended to diagnose MRSA infection nor to guide or monitor treatment for MRSA infections. Test performance is not FDA approved in patients less than 55 years old. Performed at [facility], [address] [place], [place] [address]        Radiology Studies: NM Pulmonary Perfusion Result Date: 01/25/2024 EXAM: NM Lung Perfusion Scan. CLINICAL HISTORY: Pulmonary embolism (PE) suspected, low to intermediate prob, positive D-dimer. TECHNIQUE: Radiolabeled MAA was administered intravenously and planar images of the lungs were obtained in multiple projections. RADIOPHARMACEUTICAL: 4.15 millicurie TECHNETIUM TO 35M ALBUMIN  AGGREGATED. COMPARISON: CT chest radiograph same day. FINDINGS: PERFUSION: No wedge-shaped peripheral perfusion defect within left or right lung to suggest acute pulmonary embolism. Normal perfusion pattern. IMPRESSION: 1. No perfusion defects to indicate pulmonary embolism. Electronically signed by: Norleen Boxer MD 01/25/2024 12:01 PM EST RP Workstation: [id number]   DG CHEST PORT 1 VIEW Result Date: 01/25/2024 CLINICAL DATA:  Elevated D-dimer. EXAM: PORTABLE CHEST 1 VIEW COMPARISON:  01/22/2024 FINDINGS: The heart size and mediastinal contours are within normal limits. Pacemaker again seen. Prior CABG again noted. Low lung volumes are again seen. New opacity is seen in the left perihilar region and mid lung the which may be due to atelectasis or infiltrate. No significant pleural effusion. IMPRESSION: New left perihilar and midlung atelectasis or infiltrate. Electronically Signed   By: Norleen DELENA Kil M.D.   On: 01/25/2024 08:46      Scheduled Meds:  allopurinol   100 mg Oral Q0600   apixaban   5 mg Oral BID   atorvastatin   40 mg Oral QPM   isosorbide  mononitrate  30 mg Oral Daily   lactulose   20 g Oral BID   metoprolol  tartrate  25 mg Oral BID   sodium chloride  flush  3 mL Intravenous Q12H   Continuous Infusions:  ceFEPime  (MAXIPIME ) IV 2 g (01/26/24 0019)     LOS: 4 days   Time spent: 25 minutes   Delon Hoe, DO [facility] 01/26/2024, 11:29 AM   Available via Epic secure chat 7am-7pm After these hours, please refer to coverage provider listed on [email or web address]

## 2024-01-26 NOTE — Progress Notes (Signed)
 Physical Therapy Treatment Patient Details Name: William Hatfield MRN: [id number] DOB: Jan 11, 1946 Today's Date: 01/26/2024   History of Present Illness 78 y.o. male presenting with fever, nausea, fatigue, and left leg redness and swelling. dopplers negative. CXR findings raise question of bronchopneumonia vs atelectasis.  PMH:  hypertension, CAD status post CABG, atrial fibrillation on Eliquis , CKD 3A, osteoarthritis status post L TKA on 01/16/2024, Parkinson's (per pt)    PT Comments  Pt agreeable to working with therapy. During session: BP 148/74, HR 100 bpm, O2 93% on RA. Pt reports significant pain with activity. He has dyspnea with minimal activity. Cues for pursed lip breathing throughout session. Currently, he requires increased time and Min A for bed mobility and transfers. He will likely need +2 for ambulation safety. Encouraged pt to perform knee ROM exercises and spirometer breathing exercises as tolerated. Will continue to follow. MD arrived during session and reports they are pursuing rehab consult at daughter's request. Discussed with pt briefly-he is agreeable. Patient will benefit from intensive inpatient follow-up therapy, >3 hours/day.     If plan is discharge home, recommend the following: A lot of help with walking and/or transfers;A lot of help with bathing/dressing/bathroom;Assistance with cooking/housework;Assist for transportation;Help with stairs or ramp for entrance   Can travel by private vehicle        Equipment Recommendations  None recommended by PT    Recommendations for Other Services       Precautions / Restrictions Precautions Precautions: Fall;Knee Restrictions Weight Bearing Restrictions Per Provider Order: No LLE Weight Bearing Per Provider Order: Weight bearing as tolerated     Mobility  Bed Mobility Overal bed mobility: Needs Assistance Bed Mobility: Supine to Sit     Supine to sit: Min assist, HOB elevated, Used rails     General bed mobility  comments: Increased time and effort. Pt practiced used gait belt as leg lifter to assist L LE off bed. Cues provided as needed. Mild lightheadedness, moderate dyspnea.    Transfers Overall transfer level: Needs assistance Equipment used: Rolling walker (2 wheels) Transfers: Sit to/from Stand, Bed to chair/wheelchair/BSC Sit to Stand: Min assist, From elevated surface Stand pivot transfers: Min assist         General transfer comment: x 2. Cues for safety, technique, hand/LE placement, and for pt to try to get L heel down on floor. Assist needed to power up, stabilize, posiiton L LE, control descent. Increased time and effort for pt. Pt performed stand pivot with RW utilizing TDWB pattern. L knee in flexed position. Fall risk. Moderate dyspnea and significant pain reported by pt.    Ambulation/Gait                   Stairs             Wheelchair Mobility     Tilt Bed    Modified Rankin (Stroke Patients Only)       Balance Overall balance assessment: Needs assistance         Standing balance support: Bilateral upper extremity supported, During functional activity, Reliant on assistive device for balance Standing balance-Leahy Scale: Poor                              Communication Communication Communication: No apparent difficulties  Cognition Arousal: Alert Behavior During Therapy: WFL for tasks assessed/performed   PT - Cognitive impairments: No apparent impairments  Following commands: Intact      Cueing Cueing Techniques: Verbal cues  Exercises Total Joint Exercises Ankle Circles/Pumps: AROM, Both, 10 reps Quad Sets: AROM, Both, 10 reps Heel Slides: AAROM, Left, 10 reps Straight Leg Raises: AAROM, 10 reps, Left Goniometric ROM: ~15-65 degrees    General Comments        Pertinent Vitals/Pain Pain Assessment Pain Assessment: Faces Faces Pain Scale: Hurts whole lot Pain Location: L knee  with mobility Pain Descriptors / Indicators: Grimacing, Operative site guarding, Aching, Throbbing Pain Intervention(s): Limited activity within patient's tolerance, Monitored during session, Ice applied, Repositioned    Home Living                          Prior Function            PT Goals (current goals can now be found in the care plan section) Progress towards PT goals: Progressing toward goals (but limited by pain, dyspnea, general weakness)    Frequency    Min 4X/week      PT Plan      Co-evaluation              AM-PAC PT 6 Clicks Mobility   Outcome Measure  Help needed turning from your back to your side while in a flat bed without using bedrails?: A Little Help needed moving from lying on your back to sitting on the side of a flat bed without using bedrails?: A Little Help needed moving to and from a bed to a chair (including a wheelchair)?: A Lot Help needed standing up from a chair using your arms (e.g., wheelchair or bedside chair)?: A Lot Help needed to walk in hospital room?: A Lot Help needed climbing 3-5 steps with a railing? : Total 6 Click Score: 13    End of Session Equipment Utilized During Treatment: Gait belt Activity Tolerance: Patient limited by fatigue;Patient limited by pain Patient left: in chair;with call bell/phone within reach   PT Visit Diagnosis: Pain;Difficulty in walking, not elsewhere classified (R26.2);Muscle weakness (generalized) (M62.81) Pain - Right/Left: Left Pain - part of body: Knee;Leg     Time: 8984-8953 PT Time Calculation (min) (ACUTE ONLY): 31 min  Charges:    $Gait Training: 8-22 mins $Therapeutic Exercise: 8-22 mins PT General Charges $$ ACUTE PT VISIT: 1 Visit                        Dannial SQUIBB, PT Acute Rehabilitation  Office: [phone]

## 2024-01-26 NOTE — Plan of Care (Signed)
  Problem: Education: Goal: Knowledge of General Education information will improve Description: Including pain rating scale, medication(s)/side effects and non-pharmacologic comfort measures Outcome: Progressing   Problem: Health Behavior/Discharge Planning: Goal: Ability to manage health-related needs will improve Outcome: Progressing   Problem: Clinical Measurements: Goal: Ability to maintain clinical measurements within normal limits will improve Outcome: Progressing   Problem: Activity: Goal: Risk for activity intolerance will decrease Outcome: Progressing   Problem: Nutrition: Goal: Adequate nutrition will be maintained Outcome: Progressing   Problem: Coping: Goal: Level of anxiety will decrease Outcome: Progressing   Problem: Elimination: Goal: Will not experience complications related to bowel motility Outcome: Progressing   Problem: Pain Managment: Goal: General experience of comfort will improve and/or be controlled Outcome: Progressing

## 2024-01-26 NOTE — Progress Notes (Signed)
 Physical Therapy Treatment Patient Details Name: William Hatfield MRN: [id number] DOB: 18-Jul-1945 Today's Date: 01/26/2024   History of Present Illness 78 y.o. male presenting with fever, nausea, fatigue, and left leg redness and swelling. dopplers negative. CXR findings raise question of bronchopneumonia vs atelectasis.  PMH:  hypertension, CAD status post CABG, atrial fibrillation on Eliquis , CKD 3A, osteoarthritis status post L TKA on 01/16/2024, Parkinson's (per pt)    PT Comments  2nd session to continue mobility and knee exercises. Min A to rise from recliner. Pt continues to use TDWB gait pattern with L knee maintained in flexion. Assisted pt back to bed. Reviewed quad sets and encouraged him to perform 10 reps every hour, as tolerated. Elevated L LE with pillows underneath calf/heel to passively encourage extension as well (instructed pt to remove pillow/lower leg if gets to painful and to keep knee as straight as possible when at rest). Will continue to follow pt during this hospital stay. Patient will benefit from intensive inpatient follow-up therapy, >3 hours/day     If plan is discharge home, recommend the following: A lot of help with walking and/or transfers;A lot of help with bathing/dressing/bathroom;Assistance with cooking/housework;Assist for transportation;Help with stairs or ramp for entrance   Can travel by private vehicle        Equipment Recommendations  None recommended by PT    Recommendations for Other Services       Precautions / Restrictions Precautions Precautions: Fall;Knee Recall of Precautions/Restrictions: Intact Precaution/Restrictions Comments: Reviewed importance of terminal knee extension, elevation in extension Restrictions Weight Bearing Restrictions Per Provider Order: No LLE Weight Bearing Per Provider Order: Weight bearing as tolerated     Mobility  Bed Mobility Overal bed mobility: Needs Assistance Bed Mobility: Sit to Supine      Sit to  supine: Contact guard assist   General bed mobility comments: Increased time and effort. Pt practiced using gait belt as leg lifter to assist L LE. Cues provided as needed.    Transfers Overall transfer level: Needs assistance Equipment used: Rolling walker (2 wheels) Transfers: Sit to/from Stand Sit to Stand: Min assist  Step pivot transfers: Min assist       General transfer comment: Cues for safety, technique, hand/LE placement, and for pt to try to get L heel down on floor. Assist needed to power up, stabilize, position L LE, control descent. Increased time and effort for pt. Pt performed  pivot with RW utilizing TDWB pattern, L knee in flexed position. Fall risk. Moderate dyspnea and pain reported by pt.    Ambulation/Gait                   Stairs             Wheelchair Mobility     Tilt Bed    Modified Rankin (Stroke Patients Only)       Balance Overall balance assessment: Needs assistance         Standing balance support: Bilateral upper extremity supported, During functional activity, Reliant on assistive device for balance Standing balance-Leahy Scale: Poor                              Communication Communication Communication: No apparent difficulties  Cognition Arousal: Alert Behavior During Therapy: WFL for tasks assessed/performed   PT - Cognitive impairments: No apparent impairments  Following commands: Intact      Cueing Cueing Techniques: Verbal cues  Exercises     General Comments        Pertinent Vitals/Pain Pain Assessment Pain Assessment: 0-10 Pain Score: 7  Faces Pain Scale: Hurts whole lot Pain Location: L knee with mobility Pain Descriptors / Indicators: Grimacing, Operative site guarding, Aching, Throbbing Pain Intervention(s): Limited activity within patient's tolerance, Monitored during session, Ice applied, Repositioned    Home Living                           Prior Function            PT Goals (current goals can now be found in the care plan section) Progress towards PT goals: Progressing toward goals    Frequency    Min 4X/week      PT Plan      Co-evaluation              AM-PAC PT 6 Clicks Mobility   Outcome Measure  Help needed turning from your back to your side while in a flat bed without using bedrails?: A Little Help needed moving from lying on your back to sitting on the side of a flat bed without using bedrails?: A Little Help needed moving to and from a bed to a chair (including a wheelchair)?: A Lot Help needed standing up from a chair using your arms (e.g., wheelchair or bedside chair)?: A Lot Help needed to walk in hospital room?: A Lot Help needed climbing 3-5 steps with a railing? : Total 6 Click Score: 13    End of Session Equipment Utilized During Treatment: Gait belt Activity Tolerance: Patient limited by fatigue;Patient limited by pain Patient left: in bed;with call bell/phone within reach;with bed alarm set   PT Visit Diagnosis: Pain;Difficulty in walking, not elsewhere classified (R26.2);Muscle weakness (generalized) (M62.81) Pain - Right/Left: Left Pain - part of body: Knee     Time: 8484-8471 PT Time Calculation (min) (ACUTE ONLY): 13 min  Charges:    $Therapeutic Activity: 8-22 mins PT General Charges $$ ACUTE PT VISIT: 1 Visit                        Dannial SQUIBB, PT Acute Rehabilitation  Office: [phone]

## 2024-01-26 NOTE — Progress Notes (Signed)
 PHARMACY - ANTICOAGULATION CONSULT NOTE  Pharmacy Consult for Heparin  Indication: afib, r/o new PE  Allergies  Allergen Reactions   Niaspan [Niacin] Hives, Itching and Other (See Comments)    [facility] ONLY    Patient Measurements: Height: 5' 11 (180.3 cm) Weight: 84.8 kg (186 lb 15.2 oz) IBW/kg (Calculated) : 75.3 HEPARIN  DW (KG): 84.8  Vital Signs: Temp: 97.8 F (36.6 C) (11/29 0820) Temp Source: Oral (11/29 0820) BP: 149/77 (11/29 0820) Pulse Rate: 90 (11/29 0820)  Labs: Recent Labs    01/24/24 0618 01/24/24 1424 01/25/24 0326 01/25/24 0329 01/25/24 0333 01/26/24 0302  HGB 10.4*  --  10.2*  --   --  9.4*  HCT 33.0*  --  32.4*  --   --  29.6*  PLT 382  --  389  --   --  371  APTT 42* 66*  --  85*  --  86*  HEPARINUNFRC >1.10*  --   --   --  1.10* 1.00*  CREATININE 1.30*  --  1.19  --   --  1.09    Estimated Creatinine Clearance: 61.4 mL/min (by C-G formula based on SCr of 1.09 mg/dL).   Medical History: Past Medical History:  Diagnosis Date   Anginal pain    Arthritis    Atrial fibrillation (HCC)    a. post op in 2006  b. recurrence on 08/2016 admission for NSTEMI   CAD (coronary artery disease)    a. 2006: s/p CABG at [facility] with LIMA to LAD, SVG to PDA, SVG to OM1, SVG to D1  b. 08/2016: NSTEMI 09/18/16 which showed severe native 3V CAD with patent SVG-->PLA, LIMA-->LAD and total occlusion of SVG--> OM1 with unsuccessful attempt at PCI of SVG--> OM with inability to restore flow.    Cancer (HCC)    Rt. arm,head squamus.   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 Now on IV heparin  for ?PE. Ddimer 1.49 elevated, Increased  SOB and chest discomfort with elevated troponins also. - 11/26: Dopplers negative - 11/26 PM CT with PNA - 11/29: VQ negative for PE - Hep level 1 elevated as expected on Eliquis , aPTT 86 in goal. Hgb 9.4 down. Plts WNL. Awaiting Cardiology plan  Goal of Therapy:  aPTT 66-102 seconds Monitor platelets by anticoagulation protocol: Yes   Plan:  Con't IV heparin  1450 units/hr Daily aPTT, Heparin  level, and CBC   Graclynn Vanantwerp Karoline Marina, PharmD, BCPS Clinical Staff Pharmacist Marina Salines Stillinger 01/26/2024,8:33 AM

## 2024-01-26 NOTE — Plan of Care (Signed)
  Problem: Coping: Goal: Level of anxiety will decrease 01/26/2024 0342 by Trudy Maus, RN Outcome: Progressing 01/26/2024 0225 by Trudy Maus, RN Outcome: Progressing   Problem: Pain Managment: Goal: General experience of comfort will improve and/or be controlled 01/26/2024 0342 by Trudy Maus, RN Outcome: Progressing 01/26/2024 0225 by Trudy Maus, RN Outcome: Progressing   Problem: Safety: Goal: Ability to remain free from injury will improve 01/26/2024 0342 by Trudy Maus, RN Outcome: Progressing 01/26/2024 0225 by Trudy Maus, RN Outcome: Progressing

## 2024-01-27 ENCOUNTER — Inpatient Hospital Stay (HOSPITAL_COMMUNITY)

## 2024-01-27 DIAGNOSIS — J189 Pneumonia, unspecified organism: Secondary | ICD-10-CM | POA: Diagnosis not present

## 2024-01-27 DIAGNOSIS — R42 Dizziness and giddiness: Secondary | ICD-10-CM | POA: Diagnosis not present

## 2024-01-27 DIAGNOSIS — Z951 Presence of aortocoronary bypass graft: Secondary | ICD-10-CM | POA: Diagnosis not present

## 2024-01-27 LAB — BASIC METABOLIC PANEL WITH GFR
Anion gap: 12 (ref 5–15)
Anion gap: 11 (ref 5–15)
BUN: 33 mg/dL — ABNORMAL HIGH (ref 8–23)
BUN: 33 mg/dL — ABNORMAL HIGH (ref 8–23)
CO2: 21 mmol/L — ABNORMAL LOW (ref 22–32)
CO2: 21 mmol/L — ABNORMAL LOW (ref 22–32)
Calcium: 9.3 mg/dL (ref 8.9–10.3)
Calcium: 9.4 mg/dL (ref 8.9–10.3)
Chloride: 105 mmol/L (ref 98–111)
Chloride: 106 mmol/L (ref 98–111)
Creatinine, Ser: 1.07 mg/dL (ref 0.61–1.24)
Creatinine, Ser: 1.07 mg/dL (ref 0.61–1.24)
GFR, Estimated: >60 mL/min (ref >60)
GFR, Estimated: >60 mL/min (ref >60)
Glucose, Bld: 142 mg/dL — ABNORMAL HIGH (ref 70–99)
Glucose, Bld: 125 mg/dL — ABNORMAL HIGH (ref 70–99)
Potassium: 5.3 mmol/L — ABNORMAL HIGH (ref 3.5–5.1)
Potassium: 5 mmol/L (ref 3.5–5.1)
Sodium: 137 mmol/L (ref 135–145)
Sodium: 138 mmol/L (ref 135–145)

## 2024-01-27 LAB — CBC
HCT: 34.2 % — ABNORMAL LOW (ref 39.0–52.0)
Hemoglobin: 10.7 g/dL — ABNORMAL LOW (ref 13.0–17.0)
MCH: 29.8 pg (ref 26.0–34.0)
MCHC: 31.3 g/dL (ref 30.0–36.0)
MCV: 95.3 fL (ref 80.0–100.0)
Platelets: 452 K/uL — ABNORMAL HIGH (ref 150–400)
RBC: 3.59 MIL/uL — ABNORMAL LOW (ref 4.22–5.81)
RDW: 14.8 % (ref 11.5–15.5)
WBC: 15.7 K/uL — ABNORMAL HIGH (ref 4.0–10.5)
nRBC: 0 % (ref 0.0–0.2)

## 2024-01-27 LAB — CULTURE, BLOOD (ROUTINE X 2)
Culture: NO GROWTH
Special Requests: ADEQUATE

## 2024-01-27 MED ORDER — FUROSEMIDE 40 MG PO TABS
40.0000 mg | ORAL_TABLET | Freq: Every day | ORAL | Status: DC
  Filled 2024-01-27: qty 1

## 2024-01-27 MED ORDER — ISOSORBIDE MONONITRATE ER 30 MG PO TB24: 15.0000 mg | ORAL_TABLET | Freq: Every day | ORAL | Status: DC

## 2024-01-27 MED ORDER — HYDROMORPHONE HCL 1 MG/ML IJ SOLN
1.0000 mg | INTRAMUSCULAR | Status: DC | PRN
  Administered 2024-01-27: 1 mg via INTRAVENOUS
  Filled 2024-01-27: qty 1

## 2024-01-27 MED ORDER — BISACODYL 10 MG RE SUPP
10.0000 mg | Freq: Every day | RECTAL | Status: DC | PRN
  Administered 2024-01-27: 10 mg via RECTAL
  Filled 2024-01-27: qty 1

## 2024-01-27 MED ORDER — POLYETHYLENE GLYCOL 3350 17 G PO PACK
17.0000 g | PACK | Freq: Every day | ORAL | Status: DC
  Administered 2024-01-27 – 2024-01-30 (×4): 17 g via ORAL
  Filled 2024-01-27 (×5): qty 1

## 2024-01-27 MED ORDER — ONDANSETRON HCL 4 MG/2ML IJ SOLN
4.0000 mg | Freq: Four times a day (QID) | INTRAMUSCULAR | Status: DC | PRN
  Administered 2024-01-27 – 2024-01-30 (×3): 4 mg via INTRAVENOUS
  Filled 2024-01-27 (×3): qty 2

## 2024-01-27 NOTE — Evaluation (Signed)
 Occupational Therapy Evaluation Patient Details Name: William Hatfield MRN: [id number] DOB: 1945/05/21 Today's Date: 01/27/2024   History of Present Illness   78 y.o. male presenting with fever, nausea, fatigue, and left leg redness and swelling. dopplers negative. CXR findings raise question of bronchopneumonia vs atelectasis.  PMH:  hypertension, CAD status post CABG, atrial fibrillation on Eliquis , CKD 3A, osteoarthritis status post L TKA on 01/16/2024, Parkinson's (per pt)     Clinical Impressions Pt admitted with the above. Pt currently with functional limitations due to the deficits listed below (see OT Problem List).  Pt will benefit from acute skilled OT to increase their safety and independence with ADL and functional mobility for ADL to facilitate discharge.   Pt very motivated to get well and get stronger      If plan is discharge home, recommend the following:   A lot of help with bathing/dressing/bathroom     Functional Status Assessment   Patient has had a recent decline in their functional status and demonstrates the ability to make significant improvements in function in a reasonable and predictable amount of time.     Equipment Recommendations   BSC/3in1      Precautions/Restrictions   Precautions Precautions: Fall;Knee Precaution/Restrictions Comments: Reviewed importance of terminal knee extension, elevation in extension  no pillow behind knee Restrictions Weight Bearing Restrictions Per Provider Order: No LLE Weight Bearing Per Provider Order: Weight bearing as tolerated     Mobility Bed Mobility Overal bed mobility: Needs Assistance Bed Mobility: Supine to Sit     Supine to sit: Min assist, HOB elevated, Used rails          Transfers Overall transfer level: Needs assistance Equipment used: Rolling walker (2 wheels) Transfers: Sit to/from Stand Sit to Stand: Min assist Stand pivot transfers: Min assist   Step pivot transfers: Min  assist            Balance Overall balance assessment: Needs assistance         Standing balance support: Bilateral upper extremity supported, During functional activity, Reliant on assistive device for balance Standing balance-Leahy Scale: Poor                             ADL either performed or assessed with clinical judgement   ADL Overall ADL's : Needs assistance/impaired Eating/Feeding: Set up;Sitting   Grooming: Set up;Sitting   Upper Body Bathing: Minimal assistance;Sitting   Lower Body Bathing: Sit to/from stand;Maximal assistance   Upper Body Dressing : Minimal assistance;Sitting   Lower Body Dressing: Sit to/from stand;Cueing for sequencing;Cueing for safety;Maximal assistance   Toilet Transfer: Moderate assistance Toilet Transfer Details (indicate cue type and reason): bed to chair Toileting- Clothing Manipulation and Hygiene: Moderate assistance;Sit to/from stand;Sitting/lateral lean               Vision Patient Visual Report: No change from baseline              Pertinent Vitals/Pain Pain Assessment Pain Assessment: Faces Pain Score: 4  Pain Location: L knee with mobility Pain Descriptors / Indicators: Grimacing, Operative site guarding, Aching, Throbbing     Extremity/Trunk Assessment Upper Extremity Assessment Upper Extremity Assessment: Overall WFL for tasks assessed           Communication Communication Communication: No apparent difficulties   Cognition Arousal: Alert Behavior During Therapy: [facility] for tasks assessed/performed  Following commands: Intact       Cueing  General Comments   Cueing Techniques: Verbal cues   Reminded pt of not putting pillow under knee           Home Living Family/patient expects to be discharged to:: Private residence Living Arrangements: Alone Available Help at Discharge: Family;Available 24 hours/day Type of Home: House Home  Access: Level entry     Home Layout: One level               Home Equipment: Rollator (4 wheels);Cane - single Librarian, Academic (2 wheels)   Additional Comments: 2 daughters to assist, one is [employer]      Prior Functioning/Environment Prior Level of Function : Independent/Modified Independent;Driving             Mobility Comments: walked without AD, no falls in 6 months--prior to TKA, has been amb with RW since knee surgery ADLs Comments: ind prior to TKA    OT Problem List: Decreased strength;Decreased activity tolerance;Impaired balance (sitting and/or standing)   OT Treatment/Interventions: Self-care/ADL training;Patient/family education;DME and/or AE instruction      OT Goals(Current goals can be found in the care plan section)   Acute Rehab OT Goals Patient Stated Goal: get stronger with rehab OT Goal Formulation: With patient Time For Goal Achievement: 02/04/24 Potential to Achieve Goals: Good ADL Goals Pt Will Perform Grooming: with supervision;standing Pt Will Perform Lower Body Dressing: with supervision;sit to/from stand Pt Will Transfer to Toilet: with supervision;regular height toilet Pt Will Perform Toileting - Clothing Manipulation and hygiene: with supervision;sit to/from stand   OT Frequency:  Min 2X/week       AM-PAC OT 6 Clicks Daily Activity     Outcome Measure Help from another person eating meals?: A Little Help from another person taking care of personal grooming?: A Little Help from another person toileting, which includes using toliet, bedpan, or urinal?: A Lot Help from another person bathing (including washing, rinsing, drying)?: A Little Help from another person to put on and taking off regular upper body clothing?: A Lot Help from another person to put on and taking off regular lower body clothing?: A Little 6 Click Score: 16   End of Session Equipment Utilized During Treatment: Rolling walker (2 wheels) Nurse Communication:  Mobility status  Activity Tolerance: Patient tolerated treatment well Patient left: in chair;with call bell/phone within reach;with chair alarm set  OT Visit Diagnosis: Unsteadiness on feet (R26.81);Other abnormalities of gait and mobility (R26.89);Muscle weakness (generalized) (M62.81)                Time: 1029-1050 OT Time Calculation (min): 21 min Charges:  OT General Charges $OT Visit: 1 Visit OT Evaluation $OT Eval Low Complexity: 1 Low    Purvis Sidle, Norvel BIRCH 01/27/2024, 3:24 PM

## 2024-01-27 NOTE — Progress Notes (Signed)
 Reported by dayshift [place], RN) that a new wound was found on patients buttocks. Removed sacral foam dressing to assess and measure. Found a 1cm x 0.5cm small round pressure wound where skin had sloughed off on the upper right buttock. Wound bed is shallow, dry, and pink/red. Surrounding skin has erythema but is blanchable. Patient has been repositioning self in bed, encouraged him to do it more often, offered to place pillow support under both hips but patient declined. Foam dressing reapplied. Patient denied feeling any discomfort in that area. Added appropriate LDA for wound documentation.

## 2024-01-27 NOTE — Plan of Care (Signed)
   Problem: Coping: Goal: Level of anxiety will decrease Outcome: Progressing   Problem: Pain Managment: Goal: General experience of comfort will improve and/or be controlled Outcome: Progressing   Problem: Safety: Goal: Ability to remain free from injury will improve Outcome: Progressing

## 2024-01-27 NOTE — Progress Notes (Signed)
 Subjective: Pt reports pain in left knee. Main issue is related to breathing secondary to the pneumonia    Objective: Vital signs in last 24 hours: Temp:  [97.8 F (36.6 C)-98.8 F (37.1 C)] 98.2 F (36.8 C) (11/30 0551) Pulse Rate:  [87-108] 102 (11/30 0551) Resp:  [15-18] 16 (11/30 0551) BP: (121-157)/(73-89) 138/89 (11/30 0551) SpO2:  [98 %-100 %] 100 % (11/30 0551)  Intake/Output from previous day: 11/29 0701 - 11/30 0700 In: 1270.3 [P.O.:1010; I.V.:60.4; IV Piggyback:199.9] Out: 1460 [Urine:1360; Emesis/NG output:100] Intake/Output this shift: No intake/output data recorded.  Recent Labs    01/25/24 0326 01/26/24 0302 01/27/24 0439  HGB 10.2* 9.4* 10.7*   Recent Labs    01/26/24 0302 01/27/24 0439  WBC 13.8* 15.7*  RBC 3.14* 3.59*  HCT 29.6* 34.2*  PLT 371 452*   Recent Labs    01/26/24 0302 01/27/24 0439  NA 136 137  K 4.6 5.3*  CL 106 105  CO2 22 21*  BUN 36* 33*  CREATININE 1.09 1.07  GLUCOSE 128* 142*  CALCIUM  8.5* 9.3   No results for input(s): LABPT, INR in the last 72 hours.  No cellulitis present Compartment soft Mild swelling left knee. Minimal erythema more likely to post-op bruising rather than any cellulitis    Assessment/Plan: S/p left TKA- Knee appears as expected for this phase post-op. Main issue is the pneumonia which is being treated. Continue PT as tolerated    Dempsey Moan 01/27/2024, 7:25 AM

## 2024-01-27 NOTE — Progress Notes (Signed)
 Physical Therapy Treatment Patient Details Name: William Hatfield MRN: [id number] DOB: [birth year] Today's Date: 01/27/2024   History of Present Illness 78 y.o. male presenting with fever, nausea, fatigue, and left leg redness and swelling. dopplers negative. CXR findings raise question of bronchopneumonia vs atelectasis.  PMH:  hypertension, CAD status post CABG, atrial fibrillation on Eliquis , CKD 3A, osteoarthritis status post L TKA on 01/16/2024, Parkinson's (per pt)    PT Comments  Pt agreeable to working with therapy. Continues to report significant pain with activity. He was able to ambulate  ~10 feet with RW this session. Distance limited by pain, fatigue, dyspnea. HR, O2 ok. He continues to have some dizziness when mobilizing. Reviewed  no pillow under knee education. Encouraged pt to continue performing quad sets as tolerated throughout the day.     If plan is discharge home, recommend the following: A lot of help with walking and/or transfers;A lot of help with bathing/dressing/bathroom;Assistance with cooking/housework;Assist for transportation;Help with stairs or ramp for entrance   Can travel by private vehicle        Equipment Recommendations  None recommended by PT    Recommendations for Other Services       Precautions / Restrictions Precautions Precautions: Fall;Knee Precaution/Restrictions Comments: Reviewed importance of terminal knee extension, elevation in extension  no pillow behind knee Restrictions Weight Bearing Restrictions Per Provider Order: No LLE Weight Bearing Per Provider Order: Weight bearing as tolerated     Mobility  Bed Mobility Overal bed mobility: Needs Assistance Bed Mobility: Supine to Sit, Sit to Supine     Supine to sit: Min assist, HOB elevated, Used rails Sit to supine: Contact guard assist, HOB elevated, Used rails   General bed mobility comments: Increased time and effort. Pt practiced using gait belt as leg lifter to assist L LE.  Cues provided as needed.    Transfers Overall transfer level: Needs assistance Equipment used: Rolling walker (2 wheels) Transfers: Sit to/from Stand Sit to Stand: Min assist           General transfer comment: Cues for safety, technique, hand/LE placement, and for pt to try to get L heel down on floor. Assist needed to power up, stabilize, posiiton L LE, control descent. Increased time and effort for pt. L knee in flexed position. Fall risk. Moderate dyspnea and significant pain reported by pt.    Ambulation/Gait Ambulation/Gait assistance: Min assist, +2 safety/equipment Gait Distance (Feet): 7 Feet Assistive device: Rolling walker (2 wheels) Gait Pattern/deviations: Step-to pattern, Antalgic, Decreased stance time - left, Decreased weight shift to left, Knee flexed in stance - left       General Gait Details: Cues for safety, technique, sequencing, heel to floor on L. Assist to stabilize pt throughout short distance. Followed closely with recliner. Multiple standing rest breaks taken 2* fatigue, dyspnea. O2 90% on RA, HR 107 bpm.   Stairs             Wheelchair Mobility     Tilt Bed    Modified Rankin (Stroke Patients Only)       Balance Overall balance assessment: Needs assistance         Standing balance support: Bilateral upper extremity supported, During functional activity, Reliant on assistive device for balance Standing balance-Leahy Scale: Poor                              Communication Communication Communication: No apparent difficulties  Cognition Arousal: Alert  Behavior During Therapy: WFL for tasks assessed/performed   PT - Cognitive impairments: No apparent impairments                         Following commands: Intact      Cueing Cueing Techniques: Verbal cues  Exercises Total Joint Exercises Quad Sets: AROM, Left, 10 reps Straight Leg Raises: AAROM, Left, 5 reps Knee Flexion: AROM, Left, 10 reps,  Seated Goniometric ROM: ~15-70 degrees    General Comments        Pertinent Vitals/Pain Pain Assessment Pain Assessment: Faces Faces Pain Scale: Hurts whole lot Pain Location: L knee with mobility Pain Descriptors / Indicators: Grimacing, Operative site guarding, Aching, Throbbing Pain Intervention(s): Limited activity within patient's tolerance, Monitored during session, Repositioned, Ice applied    Home Living                          Prior Function            PT Goals (current goals can now be found in the care plan section) Progress towards PT goals: Progressing toward goals    Frequency    Min 4X/week      PT Plan      Co-evaluation              AM-PAC PT 6 Clicks Mobility   Outcome Measure  Help needed turning from your back to your side while in a flat bed without using bedrails?: A Little Help needed moving from lying on your back to sitting on the side of a flat bed without using bedrails?: A Little Help needed moving to and from a bed to a chair (including a wheelchair)?: A Little Help needed standing up from a chair using your arms (e.g., wheelchair or bedside chair)?: A Little Help needed to walk in hospital room?: A Lot Help needed climbing 3-5 steps with a railing? : Total 6 Click Score: 15    End of Session Equipment Utilized During Treatment: Gait belt Activity Tolerance: Patient limited by fatigue;Patient limited by pain Patient left: in bed;with call bell/phone within reach;with bed alarm set   PT Visit Diagnosis: Pain;Difficulty in walking, not elsewhere classified (R26.2);Muscle weakness (generalized) (M62.81) Pain - Right/Left: Left Pain - part of body: Knee     Time: 8575-8541 PT Time Calculation (min) (ACUTE ONLY): 34 min  Charges:    $Gait Training: 8-22 mins $Therapeutic Exercise: 8-22 mins PT General Charges $$ ACUTE PT VISIT: 1 Visit                        Dannial SQUIBB, PT Acute Rehabilitation  Office:  [phone]

## 2024-01-27 NOTE — Consult Note (Addendum)
 WOC Nurse Consult Note: Reason for Consult: pressure injury to buttocks  Wound type: Stage 3 Pressure Injury coccyx/B buttocks 80% red 20% tan  Pressure Injury POA: no, admitted 11/25 Measurement: see nursing flowsheet  Wound bed: as above  Drainage (amount, consistency, odor) see nursing flowsheet  Periwound: deep erythema, peeling epithelium  Dressing procedure/placement/frequency: Cleanse coccyx/B buttocks wound with Vashe, do not rinse. Apply Xeroform gauze ([facility] [phone]) to wound bed daily.  Secure with silicone foam or ABD pad and clothe tape whichever is preferred.   POC discussed with bedside nurse. WOC team will follow every 7 to 10 days to evaluate area and change POC as needed   Thank you,    Alyne Martinson MSN, RN-BC, [employer]

## 2024-01-27 NOTE — Progress Notes (Addendum)
 PROGRESS NOTE    William Hatfield  FMW:[id number] DOB: Jan 15, 1946 DOA: 01/22/2024 PCP: Henriette Anes, DO     Brief Narrative:  William Hatfield is a 78 y.o. 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   Patient began feeling generally poor with nausea, fever, chills, and fatigue 2 or 3 days ago.  His daughter noted today that the left knee appeared red and swollen.  Patient has resumed full dose Eliquis  since the procedure.   Patient was admitted for suspected left lower extremity cellulitis and started on broad-spectrum antibiotics.  Orthopedic surgery evaluated patient, did not feel erythema was secondary to cellulitis.  Patient was ultimately diagnosed with pneumonia.  Due to complaints of chest pain and history of CAD, cardiology was consulted.  VQ scan was negative for PE.  New events last 24 hours / Subjective: Had nausea and vomiting after taking morning medications this morning.  States that similar episode happened last night.  No nausea prior to medication administration, but as soon as he swallowed pills and drink water , he had a wave of nausea with vomiting.  No further chest pressure.  Still has exertional shortness of breath.  Assessment & Plan:   Principal Problem:   HCAP (healthcare-associated pneumonia) Active Problems:   Hypertension   CAD (coronary artery disease)   Hx of CABG   Dyslipidemia   PAF (paroxysmal atrial fibrillation) (HCC)   Left leg cellulitis   Acute renal failure superimposed on stage 3a chronic kidney disease (HCC)   Left leg cellulitis, ruled out - Evaluated by orthopedic surgery, they do not feel that the erythema is from an infection, but rather expected postop changes - Venous duplex lower extremity negative for DVT  HCAP  -  Continue cefepime  today is day #6 of 7.  MRSA PCR negative  Elevated D-dimer - Unable to obtain CTA chest due to AKI - VQ scan negative  Chest pain, with mildly elevated troponin History of CAD status post CABG - Continue Lipitor, Imdur , metoprolol  - Echocardiogram without wall motion abnormality - Cardiology following.  Imdur  now on hold - Check orthostatic vital sign  Complete heart block status post pacemaker in 2021  AKI on CKD 3A - Baseline creatinine 1.3 - Held losartan , Lasix , Jardiance  - Improved and now back to baseline  Paroxysmal A-fib - Eliquis    Mild hyperkalemia - Repeat lab this afternoon  Constipation - Bowel regimen - KUB ordered today   DVT prophylaxis: Eliquis   Code Status: Full code Family Communication: Daughter at bedside today Disposition Plan: Possible rehab Status is: Inpatient Remains inpatient appropriate because: IV antibiotics, possible rehab  Antimicrobials:  Anti-infectives (From admission, onward)    Start     Dose/Rate Route Frequency Ordered Stop   01/25/24 0600  vancomycin  (VANCOREADY) IVPB 1500 mg/300 mL  Status:  Discontinued        1,500 mg 150 mL/hr over 120 Minutes Intravenous Every 24 hours 01/24/24 0742 01/24/24 1225   01/24/24 0600  vancomycin  (VANCOREADY) IVPB 1250 mg/250 mL  Status:  Discontinued        1,250 mg 166.7 mL/hr over 90 Minutes Intravenous Every 36 hours 01/22/24 1722 01/24/24 0741   01/23/24 1215  ceFEPIme  (MAXIPIME ) 2 g in sodium chloride  0.9 % 100 mL IVPB        2 g 200 mL/hr over 30  Minutes Intravenous Every 12 hours 01/23/24 1122 01/30/24 1214   01/22/24 2300  cefTRIAXone  (ROCEPHIN ) 1 g in sodium chloride  0.9 % 100 mL IVPB  Status:  Discontinued        1 g 200 mL/hr over 30 Minutes Intravenous Every 24 hours 01/22/24 1707 01/23/24 1122   01/22/24 1545  vancomycin  (VANCOCIN ) IVPB 1000 mg/200 mL premix        1,000 mg 200 mL/hr over 60 Minutes Intravenous  Once 01/22/24 1539 01/22/24 1840   01/22/24  1545  ceFAZolin  (ANCEF ) IVPB 1 g/50 mL premix        1 g 100 mL/hr over 30 Minutes Intravenous  Once 01/22/24 1539 01/22/24 1621        Objective: Vitals:   01/26/24 1950 01/26/24 2003 01/27/24 0551 01/27/24 0906  BP: (!) 157/87  138/89 (!) 154/83  Pulse: (!) 106 (!) 108 (!) 102 99  Resp: 16  16   Temp: 98.8 F (37.1 C)  98.2 F (36.8 C)   TempSrc: Oral     SpO2: 100%  100% 96%  Weight:      Height:        Intake/Output Summary (Last 24 hours) at 01/27/2024 1029 Last data filed at 01/27/2024 0800 Gross per 24 hour  Intake 1030.25 ml  Output 1550 ml  Net -519.75 ml   Filed Weights   01/22/24 1703  Weight: 84.8 kg   Examination: General exam: Appears calm, mild conversational dyspnea Respiratory system: Clear to auscultation anteriorly.  On room air Cardiovascular system: S1 & S2 heard, RRR. Gastrointestinal system: Abdomen is nondistended Central nervous system: Alert and oriented. Non focal exam. Speech clear  Skin: Incision over knee with staples in place, clean and dry, evolving erythema Psychiatry: Judgement and insight appear stable. Mood & affect appropriate.    Data Reviewed: I have personally reviewed following labs and imaging studies  CBC: Recent Labs  Lab 01/22/24 1405 01/23/24 0323 01/24/24 0618 01/25/24 0326 01/26/24 0302 01/27/24 0439  WBC 13.2* 12.7* 16.4* 14.7* 13.8* 15.7*  NEUTROABS 10.5* 9.5*  --   --   --   --   HGB 10.9* 10.2* 10.4* 10.2* 9.4* 10.7*  HCT 33.5* 32.4* 33.0* 32.4* 29.6* 34.2*  MCV 92.3 95.0 96.8 95.0 94.3 95.3  PLT 317 285 382 389 371 452*   Basic Metabolic Panel: Recent Labs  Lab 01/23/24 0323 01/24/24 0618 01/25/24 0326 01/26/24 0302 01/27/24 0439  NA 137 137 138 136 137  K 5.3* 4.8 4.8 4.6 5.3*  CL 102 104 105 106 105  CO2 24 22 25 22  21*  GLUCOSE 110* 117* 116* 128* 142*  BUN 64* 52* 41* 36* 33*  CREATININE 1.60* 1.30* 1.19 1.09 1.07  CALCIUM  9.1 9.2 8.8* 8.5* 9.3   GFR: Estimated Creatinine  Clearance: 62.6 mL/min (by C-G formula based on SCr of 1.07 mg/dL). Liver Function Tests: Recent Labs  Lab 01/22/24 1405 01/23/24 0323  AST 41 45*  ALT 63* 53*  ALKPHOS 206* 206*  BILITOT 0.8 0.7  PROT 7.2 6.5  ALBUMIN 3.7 3.2*   No results for input(s): LIPASE, AMYLASE in the last 168 hours. No results for input(s): AMMONIA in the last 168 hours. Coagulation Profile: No results for input(s): INR, PROTIME in the last 168 hours. Cardiac Enzymes: No results for input(s): CKTOTAL, CKMB, CKMBINDEX, TROPONINI in the last 168 hours. BNP (last 3 results) No results for input(s): PROBNP in the last 8760 hours. HbA1C: No results for input(s): HGBA1C in the last 72  hours. CBG: No results for input(s): GLUCAP in the last 168 hours. Lipid Profile: Recent Labs    01/25/24 0326  CHOL 71  HDL 28*  LDLCALC 31  TRIG 60  CHOLHDL 2.6   Thyroid Function Tests: No results for input(s): TSH, T4TOTAL, FREET4, T3FREE, THYROIDAB in the last 72 hours. Anemia Panel: No results for input(s): VITAMINB12, FOLATE, FERRITIN, TIBC, IRON, RETICCTPCT in the last 72 hours. Sepsis Labs: Recent Labs  Lab 01/22/24 1618  LATICACIDVEN 1.7    Recent Results (from the past 240 hours)  Culture, blood (routine x 2)     Status: None   Collection Time: 01/22/24  4:10 PM   Specimen: BLOOD  Result Value Ref Range Status   Specimen Description   Final    BLOOD LEFT ANTECUBITAL Performed at [facility], [address] [place], [place] [address]    Special Requests   Final    BOTTLES DRAWN AEROBIC AND ANAEROBIC Blood Culture adequate volume Performed at [facility], [address] [place], [place] [address]    Culture   Final    NO GROWTH 5 DAYS Performed at [facility], [address], [place], [place] [address]    Report Status 01/27/2024 FINAL  Final  MRSA Next Gen by PCR, Nasal     Status: None    Collection Time: 01/24/24  8:12 AM   Specimen: Nasal Mucosa; Nasal Swab  Result Value Ref Range Status   MRSA by PCR Next Gen NOT DETECTED NOT DETECTED Final    Comment: (NOTE) The GeneXpert MRSA Assay (FDA approved for NASAL specimens only), is one component of a comprehensive MRSA colonization surveillance program. It is not intended to diagnose MRSA infection nor to guide or monitor treatment for MRSA infections. Test performance is not FDA approved in patients less than 43 years old. Performed at [facility], [address], [place], [place] [address]       Radiology Studies: NM Pulmonary Perfusion Result Date: 01/25/2024 EXAM: NM Lung Perfusion Scan. CLINICAL HISTORY: Pulmonary embolism (PE) suspected, low to intermediate prob, positive D-dimer. TECHNIQUE: Radiolabeled MAA was administered intravenously and planar images of the lungs were obtained in multiple projections. RADIOPHARMACEUTICAL: 4.15 millicurie TECHNETIUM TO 54M ALBUMIN  AGGREGATED. COMPARISON: CT chest radiograph same day. FINDINGS: PERFUSION: No wedge-shaped peripheral perfusion defect within left or right lung to suggest acute pulmonary embolism. Normal perfusion pattern. IMPRESSION: 1. No perfusion defects to indicate pulmonary embolism. Electronically signed by: Norleen Boxer MD 01/25/2024 12:01 PM EST RP Workstation: [id number]      Scheduled Meds:  allopurinol   100 mg Oral Q0600   apixaban   5 mg Oral BID   atorvastatin   40 mg Oral QPM   lactulose   20 g Oral BID   metoprolol  tartrate  25 mg Oral BID   polyethylene glycol  17 g Oral Daily   sodium chloride  flush  3 mL Intravenous Q12H   Continuous Infusions:  ceFEPime  (MAXIPIME ) IV 2 g (01/26/24 2334)     LOS: 5 days   Time spent: 25 minutes   Delon Hoe, DO [facility] 01/27/2024, 10:29 AM   Available via Epic secure chat 7am-7pm After these hours, please refer to coverage provider listed on [email or web address]

## 2024-01-27 NOTE — Progress Notes (Signed)
 Orthostatic VS. Pt lying; BP: 126/81, HR 99, O2 97. Pt sitting; 128/82, HR 92, O2 98. Pt standing; 126/76, HR 97, O2-96.

## 2024-01-27 NOTE — Progress Notes (Signed)
 DAILY PROGRESS NOTE   Patient Name: William Hatfield Date of Encounter: 01/27/2024 Cardiologist: [name] Sierras, MD  Chief Complaint   No chest pain overnight  Patient Profile   William Hatfield is a 78 y.o. male with a history of CAD s/p CABG x4 (LIMA-LAD, SVG-PDA, SVG-OM1, SVG-D1) in 2006, paroxysmal atrial fibrillation on Eliquis , complete heart block s/p PPM in 12/2019, carotid artery disease s/p CEA of left ICA in 04/2022 and TCAR of right ICA in 09/2022, hypertension, hyperlipidemia, CKD stage IIIa,  who is being seen 01/24/2024 for the evaluation of elevated troponin at the request of Dr. Rojelio.   Subjective   No chest pain, but had indigestion. Given acid reducers and stool regimen but vomited. Noted to have brief runs of NSVT, asymptomatic. Mildly hyperkalemic at 5.3 today. Still dizzy and short of breath when getting up.  Objective   Vitals:   01/26/24 1412 01/26/24 1950 01/26/24 2003 01/27/24 0551  BP: 121/73 (!) 157/87  138/89  Pulse: 87 (!) 106 (!) 108 (!) 102  Resp: 15 16  16   Temp: 98.2 F (36.8 C) 98.8 F (37.1 C)  98.2 F (36.8 C)  TempSrc: Oral Oral    SpO2: 100% 100%  100%  Weight:      Height:        Intake/Output Summary (Last 24 hours) at 01/27/2024 0837 Last data filed at 01/27/2024 0600 Gross per 24 hour  Intake 1030.25 ml  Output 1350 ml  Net -319.75 ml   Filed Weights   01/22/24 1703  Weight: 84.8 kg    Physical Exam   General appearance: alert and no distress Lungs: diminished breath sounds bibasilar Heart: regular rate and rhythm Extremities: edema trace RLE and L knee post-op Neurologic: Grossly normal  Inpatient Medications    Scheduled Meds:  allopurinol   100 mg Oral Q0600   apixaban   5 mg Oral BID   atorvastatin   40 mg Oral QPM   furosemide   40 mg Oral Daily   isosorbide  mononitrate  30 mg Oral Daily   lactulose   20 g Oral BID   metoprolol  tartrate  25 mg Oral BID   polyethylene glycol  17 g Oral Daily   sodium chloride  flush   3 mL Intravenous Q12H    Continuous Infusions:  ceFEPime  (MAXIPIME ) IV 2 g (01/26/24 2334)    PRN Meds: acetaminophen  **OR** acetaminophen , alum & mag hydroxide-simeth, bisacodyl , HYDROmorphone , methocarbamol , oxyCODONE , senna, trimethobenzamide    Labs   Results for orders placed or performed during the hospital encounter of 01/22/24 (from the past 48 hours)  Basic metabolic panel     Status: Abnormal   Collection Time: 01/26/24  3:02 AM  Result Value Ref Range   Sodium 136 135 - 145 mmol/L   Potassium 4.6 3.5 - 5.1 mmol/L   Chloride 106 98 - 111 mmol/L   CO2 22 22 - 32 mmol/L   Glucose, Bld 128 (H) 70 - 99 mg/dL    Comment: Glucose reference range applies only to samples taken after fasting for at least 8 hours.   BUN 36 (H) 8 - 23 mg/dL   Creatinine, Ser 8.90 0.61 - 1.24 mg/dL   Calcium  8.5 (L) 8.9 - 10.3 mg/dL   GFR, Estimated >39 >39 mL/min    Comment: (NOTE) Calculated using the CKD-EPI Creatinine Equation (2021)    Anion gap 8 5 - 15    Comment: Performed at [facility], [address] [place], [place] [address]  CBC  Status: Abnormal   Collection Time: 01/26/24  3:02 AM  Result Value Ref Range   WBC 13.8 (H) 4.0 - 10.5 K/uL   RBC 3.14 (L) 4.22 - 5.81 MIL/uL   Hemoglobin 9.4 (L) 13.0 - 17.0 g/dL   HCT 70.3 (L) 60.9 - 47.9 %   MCV 94.3 80.0 - 100.0 fL   MCH 29.9 26.0 - 34.0 pg   MCHC 31.8 30.0 - 36.0 g/dL   RDW 85.0 88.4 - 84.4 %   Platelets 371 150 - 400 K/uL   nRBC 0.0 0.0 - 0.2 %    Comment: Performed at [facility], [address] [place], [place] [address]  Heparin  level (unfractionated)     Status: Abnormal   Collection Time: 01/26/24  3:02 AM  Result Value Ref Range   Heparin  Unfractionated 1.00 (H) 0.30 - 0.70 IU/mL    Comment: (NOTE) The clinical reportable range upper limit is being lowered to >1.10 to align with the FDA approved guidance for the current laboratory assay.  If heparin  results are below  expected values, and patient dosage has  been confirmed, suggest follow up testing of antithrombin III levels. Performed at [facility], [address], [place], [place] [address]   APTT     Status: Abnormal   Collection Time: 01/26/24  3:02 AM  Result Value Ref Range   aPTT 86 (H) 24 - 36 seconds    Comment:        IF BASELINE aPTT IS ELEVATED, SUGGEST PATIENT RISK ASSESSMENT BE USED TO DETERMINE APPROPRIATE ANTICOAGULANT THERAPY. Performed at [facility], [address], [place], [place] [address]   CBC     Status: Abnormal   Collection Time: 01/27/24  4:39 AM  Result Value Ref Range   WBC 15.7 (H) 4.0 - 10.5 K/uL   RBC 3.59 (L) 4.22 - 5.81 MIL/uL   Hemoglobin 10.7 (L) 13.0 - 17.0 g/dL   HCT 65.7 (L) 60.9 - 47.9 %   MCV 95.3 80.0 - 100.0 fL   MCH 29.8 26.0 - 34.0 pg   MCHC 31.3 30.0 - 36.0 g/dL   RDW 85.1 88.4 - 84.4 %   Platelets 452 (H) 150 - 400 K/uL   nRBC 0.0 0.0 - 0.2 %    Comment: Performed at [facility], [address], [place], [place] [address]  Basic metabolic panel with GFR     Status: Abnormal   Collection Time: 01/27/24  4:39 AM  Result Value Ref Range   Sodium 137 135 - 145 mmol/L    Comment: Electrolytes repeated to verify    Potassium 5.3 (H) 3.5 - 5.1 mmol/L   Chloride 105 98 - 111 mmol/L   CO2 21 (L) 22 - 32 mmol/L   Glucose, Bld 142 (H) 70 - 99 mg/dL    Comment: Glucose reference range applies only to samples taken after fasting for at least 8 hours.   BUN 33 (H) 8 - 23 mg/dL   Creatinine, Ser 8.92 0.61 - 1.24 mg/dL   Calcium  9.3 8.9 - 10.3 mg/dL   GFR, Estimated >39 >39 mL/min    Comment: (NOTE) Calculated using the CKD-EPI Creatinine Equation (2021)    Anion gap 12 5 - 15    Comment: Performed at [facility], [address] [place], [place] [address]    ECG   N/A  Telemetry   N/A  Radiology    NM Pulmonary Perfusion Result Date: 01/25/2024  EXAM: NM  Lung Perfusion Scan. CLINICAL HISTORY: Pulmonary embolism (PE) suspected, low to intermediate prob, positive D-dimer. TECHNIQUE: Radiolabeled MAA was administered intravenously and planar images of the lungs were obtained in multiple projections. RADIOPHARMACEUTICAL: 4.15 millicurie TECHNETIUM TO 92M ALBUMIN  AGGREGATED. COMPARISON: CT chest radiograph same day. FINDINGS: PERFUSION: No wedge-shaped peripheral perfusion defect within left or right lung to suggest acute pulmonary embolism. Normal perfusion pattern. IMPRESSION: 1. No perfusion defects to indicate pulmonary embolism. Electronically signed by: Norleen Boxer MD 01/25/2024 12:01 PM EST RP Workstation: [id number]    Cardiac Studies   ECHOCARDIOGRAM REPORT       Patient Name:   FERNANDO STOIBER Date of Exam: 01/24/2024  Medical Rec #:  [id number]   Height:       71.0 in  Accession #:    [id number]  Weight:       186.9 lb  Date of Birth:  1945-07-20   BSA:          2.049 m  Patient Age:    77 years    BP:           162/84 mmHg  Patient Gender: M           HR:           102 bpm.  Exam Location:  Inpatient   Procedure: 2D Echo, Cardiac Doppler and Color Doppler (Both Spectral and  Color            Flow Doppler were utilized during procedure).   Indications:    Elevated Troponin    History:        Patient has prior history of Echocardiogram examinations,  most                 recent 01/01/2020. CAD and Previous Myocardial Infarction;  Risk                 Factors:Hypertension.    Sonographer:    [name] Gaskins  Referring Phys: [id number] ABIGAIL CHAVEZ   IMPRESSIONS     1. Left ventricular ejection fraction, by estimation, is 50 to 55%. The  left ventricle has low normal function. The left ventricle has no regional  wall motion abnormalities. There is severe concentric left ventricular  hypertrophy. Left ventricular  diastolic parameters are indeterminate.   2. Right ventricular systolic function is normal. The right ventricular  size  is normal.   3. The mitral valve is normal in structure. Mild to moderate mitral valve  regurgitation. No evidence of mitral stenosis.   4. The aortic valve has an indeterminant number of cusps. Aortic valve  regurgitation is not visualized. No aortic stenosis is present.   Assessment   Principal Problem:   HCAP (healthcare-associated pneumonia) Active Problems:   Hypertension   CAD (coronary artery disease)   Hx of CABG   Dyslipidemia   PAF (paroxysmal atrial fibrillation) (HCC)   Left leg cellulitis   Acute renal failure superimposed on stage 3a chronic kidney disease (HCC)   Plan   Elevated Troponin CAD s/p CABG Patient has a history of CAD s/p remote CABG x4 in 2006. LHC showed occluded SVG to OM1 but all other grafts were patent. He was admitted on 01/22/2024 for left lower extremity cellulitis and pneumonia after recent knee surgery. On 11/26, he reported worsening shortness of breath followed by chest discomfort. High-sensitivity troponin elevated but flat at 348 >> 350. D-dimer elevated at 1.49.  - V/Q negative, can d/c IV heparin  today. - Low suspicion  for ischemic chest pain, suspect troponin elevation is demand ischemia related to multifocal pneumonia - Stop imdur  given dizziness and concern for orthostasis   Paroxsymal Atrial Fibrillation - He is in sinus rhythm, continue Lopressor  25mg  twice daily.  - Continue Eliquis  5 mg BID - Short bursts of NSVT noted overnight   Complete Heart Block s/p PPM S/p PPM in 2021.  - Followed by Dr. Almetta as an outpatient.  - Spoke with daughter by phone today, she is concerned that he exhibits orthostatic symptoms as an outpatient - BP drops when standing, has been dizzy- may need to decrease BB   Hypertension BP mildly elevated at times but mostly well controlled.  - Continue Lopressor  25mg  twice daily and Imdur  30mg  daily. -Stop imdur  today - Check orthostatics when he is able to mobilize, may need to decrease BB - please  record values in the progress notes   Hyperlipidemia - Continue Lipitor 40mg  daily. - LDL 31   AKI on CKD Stage IIIa Creatinine 1.71 on admission. Recent baseline around 1.3.  - Creatinine improved to 1.07 today.   Otherwise, per primary team: - Left leg cellulitis - Pneumonia  Time Spent Directly with Patient:  I have spent a total of 35 minutes with the patient reviewing hospital notes, telemetry, EKGs, labs and examining the patient as well as establishing an assessment and plan that was discussed personally with the patient.  > 50% of time was spent in direct patient care.  Length of Stay:  LOS: 5 days   Vinie KYM Maxcy, MD, [facility], FNLA, FACP  [place]  [facility]  Medical Director of the [facility] Lipid Disorders &  Cardiovascular Risk Reduction Clinic Diplomate of the American Board of Clinical Lipidology Attending Cardiologist  Direct Dial: [phone]  Fax: [phone]  Website:  [email or web address] Vinie [name] Maxcy 01/27/2024, 8:37 AM

## 2024-01-28 DIAGNOSIS — I480 Paroxysmal atrial fibrillation: Secondary | ICD-10-CM | POA: Diagnosis not present

## 2024-01-28 DIAGNOSIS — J189 Pneumonia, unspecified organism: Secondary | ICD-10-CM | POA: Diagnosis not present

## 2024-01-28 DIAGNOSIS — I5032 Chronic diastolic (congestive) heart failure: Secondary | ICD-10-CM

## 2024-01-28 DIAGNOSIS — R42 Dizziness and giddiness: Secondary | ICD-10-CM | POA: Diagnosis not present

## 2024-01-28 LAB — CBC
HCT: 33.1 % — ABNORMAL LOW (ref 39.0–52.0)
Hemoglobin: 10.4 g/dL — ABNORMAL LOW (ref 13.0–17.0)
MCH: 29.4 pg (ref 26.0–34.0)
MCHC: 31.4 g/dL (ref 30.0–36.0)
MCV: 93.5 fL (ref 80.0–100.0)
Platelets: 451 K/uL — ABNORMAL HIGH (ref 150–400)
RBC: 3.54 MIL/uL — ABNORMAL LOW (ref 4.22–5.81)
RDW: 14.9 % (ref 11.5–15.5)
WBC: 15.4 K/uL — ABNORMAL HIGH (ref 4.0–10.5)
nRBC: 0 % (ref 0.0–0.2)

## 2024-01-28 LAB — BASIC METABOLIC PANEL WITH GFR
Anion gap: 8 (ref 5–15)
BUN: 33 mg/dL — ABNORMAL HIGH (ref 8–23)
CO2: 24 mmol/L (ref 22–32)
Calcium: 9 mg/dL (ref 8.9–10.3)
Chloride: 106 mmol/L (ref 98–111)
Creatinine, Ser: 1.12 mg/dL (ref 0.61–1.24)
GFR, Estimated: >60 mL/min (ref >60)
Glucose, Bld: 106 mg/dL — ABNORMAL HIGH (ref 70–99)
Potassium: 4.9 mmol/L (ref 3.5–5.1)
Sodium: 138 mmol/L (ref 135–145)

## 2024-01-28 MED ORDER — DOCUSATE SODIUM 100 MG PO CAPS
100.0000 mg | ORAL_CAPSULE | Freq: Two times a day (BID) | ORAL | Status: DC
  Administered 2024-01-28 – 2024-01-30 (×5): 100 mg via ORAL
  Filled 2024-01-28 (×5): qty 1

## 2024-01-28 MED ORDER — FUROSEMIDE 10 MG/ML IJ SOLN
40.0000 mg | Freq: Once | INTRAMUSCULAR | Status: AC
  Administered 2024-01-28: 40 mg via INTRAVENOUS
  Filled 2024-01-28: qty 4

## 2024-01-28 MED ORDER — SENNA 8.6 MG PO TABS
1.0000 | ORAL_TABLET | Freq: Every day | ORAL | Status: DC
  Administered 2024-01-28 – 2024-01-29 (×2): 8.6 mg via ORAL
  Filled 2024-01-28 (×2): qty 1

## 2024-01-28 NOTE — PMR Pre-admission (Shared)
 PMR Admission Coordinator Pre-Admission Assessment  Patient: William Hatfield is an 78 y.o., male MRN: [id number] DOB: 12-28-1945 Height: 5' 11 (180.3 cm) Weight: 84.8 kg  Insurance Information HMO: ***    PPO: ***     PCP: ***     IPA: ***     80/20: ***     OTHER: *** PRIMARY: ***      Policy#: ***      Subscriber: *** CM Name: ***      Phone#: ***     Fax#: *** Pre-Cert#: ***      Employer: *** Benefits:  Phone #: ***     Name: *** Eff. Date: ***     Deduct: ***      Out of Pocket Max: ***      Life Max: *** CIR: ***      SNF: *** Outpatient: ***     Co-Pay: *** Home Health: ***      Co-Pay: *** DME: ***     Co-Pay: *** Providers: *** SECONDARY: ***      Policy#: ***     Phone#: ***  Financial Counselor: ***      Phone#: ***  The "Data Collection Information Summary" for patients in Inpatient Rehabilitation Facilities with attached "Privacy Act Statement-Health Care Records" was provided and verbally reviewed with: {CHL IP Patient Family WJ:695449998}  Emergency Contact Information Contact Information     Name Relation Home Work Mobile   [place] Daughter   [phone]   [employer] Daughter   [phone]      Other Contacts   None on File     Current Medical History  Patient Admitting Diagnosis: *** History of Present Illness: ***    Patient's medical record from *** has been reviewed by the rehabilitation admission coordinator and physician.  Past Medical History  Past Medical History:  Diagnosis Date   Anginal pain    Arthritis    Atrial fibrillation (HCC)    a. post op in 2006  b. recurrence on 08/2016 admission for NSTEMI   CAD (coronary artery disease)    a. 2006: s/p CABG at [facility] with LIMA to LAD, SVG to PDA, SVG to OM1, SVG to D1  b. 08/2016: NSTEMI 09/18/16 which showed severe native 3V CAD with patent SVG-->PLA, LIMA-->LAD and total occlusion of SVG--> OM1 with unsuccessful attempt at PCI of SVG--> OM with inability to restore flow.    Cancer (HCC)     Rt. arm,head squamus.   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   Essential hypertension    Myocardial infarction (HCC) 09/18/2016   Peripheral vascular disease    Pneumonia 2020   COVID PNA   Presence of permanent cardiac pacemaker    Tremor     Has the patient had major surgery during 100 days prior to admission? {Yes/No/Unknown:304600602}  Family History   family history includes Heart attack in his father and paternal grandfather.  Current Medications  Current Facility-Administered Medications:    acetaminophen  (TYLENOL ) tablet 650 mg, 650 mg, Oral, Q6H PRN, 650 mg at 01/28/24 1107 **OR** acetaminophen  (TYLENOL ) suppository 650 mg, 650 mg, Rectal, Q6H PRN, Opyd, Evalene RAMAN, MD   allopurinol  (ZYLOPRIM ) tablet 100 mg, 100 mg, Oral, Q0600, Opyd, Timothy S, MD, 100 mg at 01/28/24 0543   alum & mag hydroxide-simeth (MAALOX/MYLANTA) 200-200-20 MG/5ML suspension 30 mL, 30 mL, Oral,  Q6H PRN, Rojelio Nest, DO, 30 mL at 01/26/24 2337   apixaban  (ELIQUIS ) tablet 5 mg, 5 mg, Oral, BID, Hilty, Vinie BROCKS, MD, 5 mg at 01/28/24 9093   atorvastatin  (LIPITOR) tablet 40 mg, 40 mg, Oral, QPM, Opyd, Timothy S, MD, 40 mg at 01/27/24 1810   bisacodyl  (DULCOLAX) suppository 10 mg, 10 mg, Rectal, Daily PRN, Rojelio Nest, DO, 10 mg at 01/27/24 1327   ceFEPIme  (MAXIPIME ) 2 g in sodium chloride  0.9 % 100 mL IVPB, 2 g, Intravenous, Q12H, [place], Ellouise SQUIBB, MD, Last Rate: 200 mL/hr at 01/28/24 1226, 2 g at 01/28/24 1226   docusate sodium  (COLACE) capsule 100 mg, 100 mg, Oral, BID, Rojelio Nest, DO, 100 mg at 01/28/24 1226   HYDROmorphone  (DILAUDID ) injection 1 mg, 1 mg, Intravenous, Q4H PRN, Rojelio Nest, DO, 1 mg at 01/27/24 1216   methocarbamol  (ROBAXIN ) tablet 500 mg, 500 mg, Oral, Q6H PRN, Opyd, Timothy S, MD, 500 mg at 01/28/24 0544   metoprolol  tartrate (LOPRESSOR ) tablet 25 mg, 25 mg, Oral, BID, Opyd,  Timothy S, MD, 25 mg at 01/28/24 9093   ondansetron  (ZOFRAN ) injection 4 mg, 4 mg, Intravenous, Q6H PRN, Rojelio Nest, DO, 4 mg at 01/27/24 1521   oxyCODONE  (Oxy IR/ROXICODONE ) immediate release tablet 5 mg, 5 mg, Oral, Q4H PRN, Opyd, Timothy S, MD, 5 mg at 01/28/24 9093   polyethylene glycol (MIRALAX  / GLYCOLAX ) packet 17 g, 17 g, Oral, Daily, Rojelio Nest, DO, 17 g at 01/28/24 9094   senna (SENOKOT) tablet 8.6 mg, 1 tablet, Oral, QHS, Rojelio Nest, DO   sodium chloride  flush (NS) 0.9 % injection 3 mL, 3 mL, Intravenous, Q12H, Opyd, Timothy S, MD, 3 mL at 01/28/24 0907   trimethobenzamide  (TIGAN ) injection 200 mg, 200 mg, Intramuscular, Q6H PRN, Opyd, Timothy S, MD, 200 mg at 01/27/24 9048  Patients Current Diet:  Diet Order             Diet regular Room service appropriate? Yes; Fluid consistency: Thin; Fluid restriction: 2000 mL Fluid  Diet effective now                   Precautions / Restrictions Precautions Precautions: Fall, Knee Precaution/Restrictions Comments: Reviewed importance of terminal knee extension, elevation in extension  no pillow behind knee Restrictions Weight Bearing Restrictions Per Provider Order: No LLE Weight Bearing Per Provider Order: Weight bearing as tolerated   Has the patient had 2 or more falls or a fall with injury in the past year? {Yes/No/Unknown:304600602}  Prior Activity Level Community (5-7x/wk): Active in the community  Prior Functional Level Self Care: Did the patient need help bathing, dressing, using the toilet or eating? {Prior Functional Ozczo:695399399}  Indoor Mobility: Did the patient need assistance with walking from room to room (with or without device)? {Prior Functional Ozczo:695399399}  Stairs: Did the patient need assistance with internal or external stairs (with or without device)? {Prior Functional Ozczo:695399399}  Functional Cognition: Did the patient need help planning regular tasks such as shopping or  remembering to take medications? {Prior Functional Ozczo:695399399}  Patient Information Are you of Hispanic, Latino/a,or Spanish origin?: A. No, not of Hispanic, Latino/a, or Spanish origin What is your race?: A. White Do you need or want an interpreter to communicate with a doctor or health care staff?: 0. No  Patient's Response To:  Health Literacy and Transportation Is the patient able to respond to health literacy and transportation needs?: Yes Health Literacy - How often do you need to have someone help you  when you read instructions, pamphlets, or other written material from your doctor or pharmacy?: Never In the past 12 months, has lack of transportation kept you from medical appointments or from getting medications?: No In the past 12 months, has lack of transportation kept you from meetings, work, or from getting things needed for daily living?: No  Home Assistive Devices / Equipment Home Equipment: Rollator (4 wheels), Cane - single point, Agricultural Consultant (2 wheels)  Prior Device Use: Indicate devices/aids used by the patient prior to current illness, exacerbation or injury? {Prior Device Ldzi:695399398}  Current Functional Level Cognition  Orientation Level: Oriented X4    Extremity Assessment (includes Sensation/Coordination)  Upper Extremity Assessment: Overall WFL for tasks assessed  LLE Deficits / Details: ankle WFL, knee extension 2+/5,  hip flexion 2+/5, ~10 degree quad lag. MMT limited by pain    ADLs  Overall ADL's : Needs assistance/impaired Eating/Feeding: Set up, Sitting Grooming: Set up, Sitting Upper Body Bathing: Minimal assistance, Sitting Lower Body Bathing: Sit to/from stand, Maximal assistance Upper Body Dressing : Minimal assistance, Sitting Lower Body Dressing: Sit to/from stand, Cueing for sequencing, Cueing for safety, Maximal assistance Toilet Transfer: Moderate assistance Toilet Transfer Details (indicate cue type and reason): bed to  chair Toileting- Clothing Manipulation and Hygiene: Moderate assistance, Sit to/from stand, Sitting/lateral lean    Mobility  Overal bed mobility: Needs Assistance Bed Mobility: Supine to Sit Supine to sit: Contact guard, HOB elevated, Used rails Sit to supine: Contact guard assist, HOB elevated, Used rails General bed mobility comments: Increased time and effort. Pt practiced using gait belt as leg lifter to assist L LE. Cues provided as needed.    Transfers  Overall transfer level: Needs assistance Equipment used: Rolling walker (2 wheels) Transfers: Sit to/from Stand Sit to Stand: Min assist Bed to/from chair/wheelchair/BSC transfer type:: Stand pivot Stand pivot transfers: Min assist Step pivot transfers: Min assist General transfer comment: Cues for safety, technique, hand/LE placement, and for pt to try to get L heel down on floor. Assist needed to power up, stabilize, posiiton L LE, control descent. Increased time and effort for pt. L knee in flexed position. Fall risk. Moderate dyspnea and significant pain reported by pt.    Ambulation / Gait / Stairs / Wheelchair Mobility  Ambulation/Gait Ambulation/Gait assistance: Min assist, +2 safety/equipment Gait Distance (Feet): 10 Feet (10'x1; 5'x1) Assistive device: Rolling walker (2 wheels) Gait Pattern/deviations: Step-to pattern General Gait Details: Cues for safety, technique, sequencing, heel to floor on L. Assist to stabilize pt throughout short distance. Followed closely with recliner. Multiple seated rest breaks taken 2* fatigue, dyspnea, dizziness, fatigue, pain.  O2 96% on RA, HR 54 bpm, BP 99/54    Posture / Balance Balance Overall balance assessment: Needs assistance Sitting-balance support: No upper extremity supported, Feet supported Sitting balance-Leahy Scale: Fair Standing balance support: Bilateral upper extremity supported, During functional activity, Reliant on assistive device for balance Standing balance-Leahy  Scale: Poor Standing balance comment: unable    Special considerations/life events  {Special Care Needs/Care Considerations:304600603}   Previous Home Environment (from acute therapy documentation) Living Arrangements: Alone Available Help at Discharge: Family, Available 24 hours/day Type of Home: House Home Layout: One level Home Access: Level entry Bathroom Shower/Tub: Engineer, Manufacturing Systems: Handicapped height Bathroom Accessibility: No Home Care Services: Yes Type of Home Care Services: Home OT, Home PT Additional Comments: 2 daughters to assist, one is [employer]  Discharge Living Setting Plans for Discharge Living Setting: House Type of Home at Discharge: House  Discharge Home Layout: One level Discharge Home Access: Level entry Discharge Bathroom Shower/Tub: Tub/shower unit Discharge Bathroom Toilet: Handicapped height Discharge Bathroom Accessibility: Yes How Accessible: Accessible via walker Does the patient have any problems obtaining your medications?: No  Social/Family/Support Systems Contact Information: [phone] Anticipated Caregiver: [name] (daughter) Caregiver Availability: 24/7 Discharge Plan Discussed with Primary Caregiver: Yes Is Caregiver In Agreement with Plan?: Yes  Goals Patient/Family Goal for Rehab: PT/OT Supervisoin Expected length of stay: 10-12 days Pt/Family Agrees to Admission and willing to participate: Yes Program Orientation Provided & Reviewed with Pt/Caregiver Including Roles  & Responsibilities: Yes  Decrease burden of Care through IP rehab admission: {Inpatient Rehab Care:20780}  Possible need for SNF placement upon discharge: ***  Patient Condition: {PATIENT'S CONDITION:22832}  Preadmission Screen Completed By:  Leita KATHEE Kleine, 01/28/2024 2:21 PM ______________________________________________________________________   Discussed status with Dr. PIERRETTE on *** at *** and received approval for admission today.  Admission  Coordinator:  Leita KATHEE Kleine, CCC-SLP, time PIERRETTEPattricia ***   Assessment/Plan: Diagnosis: *** Does the need for close, 24 hr/day Medical supervision in concert with the patient's rehab needs make it unreasonable for this patient to be served in a less intensive setting? {yes_no_potentially:3041433} Co-Morbidities requiring supervision/potential complications: *** Due to {due un:6958565}, does the patient require 24 hr/day rehab nursing? {yes_no_potentially:3041433} Does the patient require coordinated care of a physician, rehab nurse, PT, OT, and SLP to address physical and functional deficits in the context of the above medical diagnosis(es)? {yes_no_potentially:3041433} Addressing deficits in the following areas: {deficits:3041436} Can the patient actively participate in an intensive therapy program of at least 3 hrs of therapy 5 days a week? {yes_no_potentially:3041433} The potential for patient to make measurable gains while on inpatient rehab is {potential:3041437} Anticipated functional outcomes upon discharge from inpatient rehab: {functional outcomes:304600100} PT, {functional outcomes:304600100} OT, {functional outcomes:304600100} SLP Estimated rehab length of stay to reach the above functional goals is: *** Anticipated discharge destination: {anticipated dc setting:21604} 10. Overall Rehab/Functional Prognosis: {potential:3041437}   MD Signature: ***
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
## 2024-01-28 NOTE — Plan of Care (Signed)

## 2024-01-28 NOTE — Progress Notes (Signed)
 Physical Therapy Treatment Patient Details Name: William Hatfield MRN: [id number] DOB: Nov 13, 1945 Today's Date: 01/28/2024   History of Present Illness 78 y.o. male presenting with fever, nausea, fatigue, and left leg redness and swelling. dopplers negative. CXR findings raise question of bronchopneumonia vs atelectasis.  PMH:  hypertension, CAD status post CABG, atrial fibrillation on Eliquis , CKD 3A, osteoarthritis status post L TKA on 01/16/2024, Parkinson's (per pt)    PT Comments  Pt agreeable to working with therapy. He continues to report significant knee pain with activity. He c/o headache and posterior neck tightness this morning. He is also still having issues with dizziness. During session: BP 99/54, HR 54 bpm, O2 95% on RA. Pt continues to have dyspnea on exertion with minimal activity. Currently, he is only tolerating ambulating ~10 feet. Min A for mobility on today with chair follow during gait training. Continued education regarding proper positioning of L knee at rest. Patient will benefit from intensive inpatient follow-up therapy, >3 hours/day    If plan is discharge home, recommend the following: A lot of help with walking and/or transfers;A lot of help with bathing/dressing/bathroom;Assistance with cooking/housework;Assist for transportation;Help with stairs or ramp for entrance   Can travel by private vehicle        Equipment Recommendations  None recommended by PT    Recommendations for Other Services       Precautions / Restrictions Precautions Precautions: Fall;Knee Precaution/Restrictions Comments: Reviewed importance of terminal knee extension, elevation in extension  no pillow behind knee Restrictions Weight Bearing Restrictions Per Provider Order: No LLE Weight Bearing Per Provider Order: Weight bearing as tolerated     Mobility  Bed Mobility Overal bed mobility: Needs Assistance Bed Mobility: Supine to Sit     Supine to sit: Contact guard, HOB elevated,  Used rails     General bed mobility comments: Increased time and effort. Pt practiced using gait belt as leg lifter to assist L LE. Cues provided as needed.    Transfers Overall transfer level: Needs assistance Equipment used: Rolling walker (2 wheels) Transfers: Sit to/from Stand Sit to Stand: Min assist           General transfer comment: Cues for safety, technique, hand/LE placement, and for pt to try to get L heel down on floor. Assist needed to power up, stabilize, posiiton L LE, control descent. Increased time and effort for pt. L knee in flexed position. Fall risk. Moderate dyspnea and significant pain reported by pt.    Ambulation/Gait Ambulation/Gait assistance: Min assist, +2 safety/equipment Gait Distance (Feet): 10 Feet (10'x1; 5'x1) Assistive device: Rolling walker (2 wheels) Gait Pattern/deviations: Step-to pattern       General Gait Details: Cues for safety, technique, sequencing, heel to floor on L. Assist to stabilize pt throughout short distance. Followed closely with recliner. Multiple seated rest breaks taken 2* fatigue, dyspnea, dizziness, fatigue, pain.  O2 96% on RA, HR 54 bpm, BP 99/54   Stairs             Wheelchair Mobility     Tilt Bed    Modified Rankin (Stroke Patients Only)       Balance Overall balance assessment: Needs assistance         Standing balance support: Bilateral upper extremity supported, During functional activity, Reliant on assistive device for balance Standing balance-Leahy Scale: Poor  Communication Communication Communication: No apparent difficulties  Cognition Arousal: Alert Behavior During Therapy: WFL for tasks assessed/performed   PT - Cognitive impairments: No apparent impairments                         Following commands: Intact      Cueing Cueing Techniques: Verbal cues  Exercises Total Joint Exercises Ankle Circles/Pumps: AROM, Both, 10  reps Quad Sets: AROM, Left, 10 reps Short Arc Quad: AAROM, Left, 10 reps Heel Slides: AAROM, Left, 10 reps Straight Leg Raises: AAROM, Left, 10 reps Long Arc Quad: AROM, Left, 10 reps, Seated    General Comments        Pertinent Vitals/Pain Pain Assessment Pain Assessment: 0-10 Pain Score: 8  Pain Location: L knee with mobility, headache Pain Descriptors / Indicators: Grimacing, Operative site guarding, Aching, Throbbing Pain Intervention(s): Monitored during session, Ice applied, Repositioned    Home Living                          Prior Function            PT Goals (current goals can now be found in the care plan section) Progress towards PT goals: Progressing toward goals    Frequency    Min 4X/week      PT Plan      Co-evaluation              AM-PAC PT 6 Clicks Mobility   Outcome Measure  Help needed turning from your back to your side while in a flat bed without using bedrails?: A Little Help needed moving from lying on your back to sitting on the side of a flat bed without using bedrails?: A Little Help needed moving to and from a bed to a chair (including a wheelchair)?: A Little Help needed standing up from a chair using your arms (e.g., wheelchair or bedside chair)?: A Little Help needed to walk in hospital room?: A Lot Help needed climbing 3-5 steps with a railing? : Total 6 Click Score: 15    End of Session Equipment Utilized During Treatment: Gait belt Activity Tolerance: Patient limited by fatigue;Patient limited by pain (limited by dizziness) Patient left: in chair;with call bell/phone within reach   PT Visit Diagnosis: Pain;Difficulty in walking, not elsewhere classified (R26.2);Muscle weakness (generalized) (M62.81) Pain - Right/Left: Left Pain - part of body: Knee     Time: 1009-1050 PT Time Calculation (min) (ACUTE ONLY): 41 min  Charges:    $Gait Training: 23-37 mins $Therapeutic Exercise: 8-22 mins PT General  Charges $$ ACUTE PT VISIT: 1 Visit                         Dannial SQUIBB, PT Acute Rehabilitation  Office: [phone]

## 2024-01-28 NOTE — Progress Notes (Signed)
 Subjective:    Patient reports pain as 4 on 0-10 scale.   Denies CP or SOB.  Voiding without difficulty. Positive flatus. Objective: Vital signs in last 24 hours: Temp:  [97.6 F (36.4 C)-98.6 F (37 C)] 97.6 F (36.4 C) (12/01 1346) Pulse Rate:  [60-108] 60 (12/01 1346) Resp:  [16-17] 16 (12/01 1346) BP: (126-154)/(61-95) 126/61 (12/01 1346) SpO2:  [95 %-99 %] 95 % (12/01 1346)  Intake/Output from previous day: 11/30 0701 - 12/01 0700 In: 849.9 [P.O.:750; IV Piggyback:99.9] Out: 1225 [Urine:1225] Intake/Output this shift: Total I/O In: 560 [P.O.:460; IV Piggyback:100] Out: 625 [Urine:625]  Recent Labs    01/26/24 0302 01/27/24 0439 01/28/24 0506  HGB 9.4* 10.7* 10.4*   Recent Labs    01/27/24 0439 01/28/24 0506  WBC 15.7* 15.4*  RBC 3.59* 3.54*  HCT 34.2* 33.1*  PLT 452* 451*   Recent Labs    01/27/24 1702 01/28/24 0506  NA 138 138  K 5.0 4.9  CL 106 106  CO2 21* 24  BUN 33* 33*  CREATININE 1.07 1.12  GLUCOSE 125* 106*  CALCIUM  9.4 9.0   No results for input(s): LABPT, INR in the last 72 hours.  Neurologically intact Neurovascular intact Sensation intact distally Dorsiflexion/Plantar flexion intact Incision: no drainage Compartment soft No evidence of DVT.  His range of motion is 90 degrees to -10.  With some encouragement from -15 to -10.  Assessment/Plan:     Advance diet Up with therapy Plan for discharge tomorrow  Emphasize extension when sitting as well as in physical therapy.  Avoid prolonged flexion of the knee.  Patient will be 2 weeks on Wednesday.  He is apparently being discharged to: Rehab tomorrow.  Staples can come out and be Steri-Stripped on Friday of this week.  When he has been off the heparin  then he can be converted back to Eliquis  for continued DVT prophylaxis.  He is continuing on antibiotic for his pneumonia.   Principal Problem:   HCAP (healthcare-associated pneumonia) Active Problems:   Hypertension   CAD  (coronary artery disease)   Hx of CABG   Dyslipidemia   PAF (paroxysmal atrial fibrillation) (HCC)   Left leg cellulitis   Acute renal failure superimposed on stage 3a chronic kidney disease (HCC)      William Hatfield Billing 01/28/2024, @NOW 

## 2024-01-28 NOTE — Progress Notes (Signed)
 PROGRESS NOTE    Facundo Allemand  FMW:[id number] DOB: February 27, 1946 DOA: 01/22/2024 PCP: Henriette Anes, DO     Brief Narrative:  William Hatfield is a 78 y.o. 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   Patient began feeling generally poor with nausea, fever, chills, and fatigue 2 or 3 days ago.  His daughter noted today that the left knee appeared red and swollen.  Patient has resumed full dose Eliquis  since the procedure.   Patient was admitted for suspected left lower extremity cellulitis and started on broad-spectrum antibiotics.  Orthopedic surgery evaluated patient, did not feel erythema was secondary to cellulitis.  Patient was ultimately diagnosed with pneumonia.  Due to complaints of chest pain and history of CAD, cardiology was consulted.  VQ scan was negative for PE.  New events last 24 hours / Subjective: Had BM yesterday after suppository. Still feeling a little woozy and nauseous but no further vomiting.   Assessment & Plan:   Principal Problem:   HCAP (healthcare-associated pneumonia) Active Problems:   Hypertension   CAD (coronary artery disease)   Hx of CABG   Dyslipidemia   PAF (paroxysmal atrial fibrillation) (HCC)   Left leg cellulitis   Acute renal failure superimposed on stage 3a chronic kidney disease (HCC)   Left leg cellulitis, ruled out - Evaluated by orthopedic surgery, they do not feel that the erythema is from an infection, but rather expected postop changes - Venous duplex lower extremity negative for DVT  HCAP  - Continue cefepime  today is day #7 of 7.  MRSA PCR negative  Elevated D-dimer - Unable to obtain CTA chest due to AKI - VQ scan negative  Chest pain, with mildly elevated troponin History of CAD status post CABG History  dCHF - Continue Lipitor, Imdur , metoprolol  - Echocardiogram without wall motion abnormality - Cardiology following.  Imdur  now on hold - Orthostatic was negative - Giving 1 IV lasix  today, monitor   Complete heart block status post pacemaker in 2021  AKI on CKD 3A - Baseline creatinine 1.3 - Held losartan , Jardiance  - Improved and now back to baseline  Paroxysmal A-fib - Eliquis    Constipation - Bowel regimen   DVT prophylaxis: Eliquis   Code Status: Full code Family Communication: Daughter updated by phone today   Disposition Plan: CIR Status is: Inpatient Remains inpatient appropriate because: IV antibiotics, possible rehab  Antimicrobials:  Anti-infectives (From admission, onward)    Start     Dose/Rate Route Frequency Ordered Stop   01/25/24 0600  vancomycin  (VANCOREADY) IVPB 1500 mg/300 mL  Status:  Discontinued        1,500 mg 150 mL/hr over 120 Minutes Intravenous Every 24 hours 01/24/24 0742 01/24/24 1225   01/24/24 0600  vancomycin  (VANCOREADY) IVPB 1250 mg/250 mL  Status:  Discontinued        1,250 mg 166.7 mL/hr over 90 Minutes Intravenous Every 36 hours 01/22/24 1722 01/24/24 0741   01/23/24 1215  ceFEPIme  (MAXIPIME ) 2 g in sodium chloride  0.9 % 100 mL IVPB        2 g 200 mL/hr over 30 Minutes Intravenous Every 12 hours 01/23/24 1122 01/30/24 1214   01/22/24 2300  cefTRIAXone  (ROCEPHIN ) 1 g in sodium chloride  0.9 % 100 mL IVPB  Status:  Discontinued        1 g 200  mL/hr over 30 Minutes Intravenous Every 24 hours 01/22/24 1707 01/23/24 1122   01/22/24 1545  vancomycin  (VANCOCIN ) IVPB 1000 mg/200 mL premix        1,000 mg 200 mL/hr over 60 Minutes Intravenous  Once 01/22/24 1539 01/22/24 1840   01/22/24 1545  ceFAZolin  (ANCEF ) IVPB 1 g/50 mL premix        1 g 100 mL/hr over 30 Minutes Intravenous  Once 01/22/24 1539 01/22/24 1621        Objective: Vitals:   01/27/24 1226 01/27/24 1351 01/27/24 2126 01/28/24 0637  BP: 137/72 126/81 (!) 154/95 (!)  144/68  Pulse: 91 99 (!) 108 85  Resp: 16 16 17 16   Temp:  98.3 F (36.8 C) 97.9 F (36.6 C) 98.6 F (37 C)  TempSrc:  Oral Oral Oral  SpO2: 95%  97% 99%  Weight:      Height:        Intake/Output Summary (Last 24 hours) at 01/28/2024 1104 Last data filed at 01/28/2024 0900 Gross per 24 hour  Intake 1189.87 ml  Output 1225 ml  Net -35.13 ml   Filed Weights   01/22/24 1703  Weight: 84.8 kg   Examination: General exam: Appears calm and comfortable  Respiratory system: Clear to auscultation. Respiratory effort normal. Cardiovascular system: S1 & S2 heard, RRR. No pedal edema. Gastrointestinal system: Abdomen is nondistended, soft and nontender. Normal bowel sounds heard. Central nervous system: Alert and oriented. Non focal exam. Speech clear  Extremities: Left knee with incision, staples in tact  Psychiatry: Judgement and insight appear stable. Mood & affect appropriate.    Data Reviewed: I have personally reviewed following labs and imaging studies  CBC: Recent Labs  Lab 01/22/24 1405 01/23/24 0323 01/24/24 0618 01/25/24 0326 01/26/24 0302 01/27/24 0439 01/28/24 0506  WBC 13.2* 12.7* 16.4* 14.7* 13.8* 15.7* 15.4*  NEUTROABS 10.5* 9.5*  --   --   --   --   --   HGB 10.9* 10.2* 10.4* 10.2* 9.4* 10.7* 10.4*  HCT 33.5* 32.4* 33.0* 32.4* 29.6* 34.2* 33.1*  MCV 92.3 95.0 96.8 95.0 94.3 95.3 93.5  PLT 317 285 382 389 371 452* 451*   Basic Metabolic Panel: Recent Labs  Lab 01/25/24 0326 01/26/24 0302 01/27/24 0439 01/27/24 1702 01/28/24 0506  NA 138 136 137 138 138  K 4.8 4.6 5.3* 5.0 4.9  CL 105 106 105 106 106  CO2 25 22 21* 21* 24  GLUCOSE 116* 128* 142* 125* 106*  BUN 41* 36* 33* 33* 33*  CREATININE 1.19 1.09 1.07 1.07 1.12  CALCIUM  8.8* 8.5* 9.3 9.4 9.0   GFR: Estimated Creatinine Clearance: 59.8 mL/min (by C-G formula based on SCr of 1.12 mg/dL). Liver Function Tests: Recent Labs  Lab 01/22/24 1405 01/23/24 0323  AST 41 45*  ALT 63* 53*   ALKPHOS 206* 206*  BILITOT 0.8 0.7  PROT 7.2 6.5  ALBUMIN 3.7 3.2*   No results for input(s): LIPASE, AMYLASE in the last 168 hours. No results for input(s): AMMONIA in the last 168 hours. Coagulation Profile: No results for input(s): INR, PROTIME in the last 168 hours. Cardiac Enzymes: No results for input(s): CKTOTAL, CKMB, CKMBINDEX, TROPONINI in the last 168 hours. BNP (last 3 results) No results for input(s): PROBNP in the last 8760 hours. HbA1C: No results for input(s): HGBA1C in the last 72 hours. CBG: No results for input(s): GLUCAP in the last 168 hours. Lipid Profile: No results for input(s): CHOL, HDL, LDLCALC, TRIG, CHOLHDL, LDLDIRECT in  the last 72 hours.  Thyroid Function Tests: No results for input(s): TSH, T4TOTAL, FREET4, T3FREE, THYROIDAB in the last 72 hours. Anemia Panel: No results for input(s): VITAMINB12, FOLATE, FERRITIN, TIBC, IRON, RETICCTPCT in the last 72 hours. Sepsis Labs: Recent Labs  Lab 01/22/24 1618  LATICACIDVEN 1.7    Recent Results (from the past 240 hours)  Culture, blood (routine x 2)     Status: None   Collection Time: 01/22/24  4:10 PM   Specimen: BLOOD  Result Value Ref Range Status   Specimen Description   Final    BLOOD LEFT ANTECUBITAL Performed at [facility], [address], [place], [place] [address]    Special Requests   Final    BOTTLES DRAWN AEROBIC AND ANAEROBIC Blood Culture adequate volume Performed at [facility], [address] [place], [place] [address]    Culture   Final    NO GROWTH 5 DAYS Performed at [facility], [address], [place], [place] [address]    Report Status 01/27/2024 FINAL  Final  MRSA Next Gen by PCR, Nasal     Status: None   Collection Time: 01/24/24  8:12 AM   Specimen: Nasal Mucosa; Nasal Swab  Result Value Ref Range Status   MRSA by PCR Next Gen NOT DETECTED NOT DETECTED Final     Comment: (NOTE) The GeneXpert MRSA Assay (FDA approved for NASAL specimens only), is one component of a comprehensive MRSA colonization surveillance program. It is not intended to diagnose MRSA infection nor to guide or monitor treatment for MRSA infections. Test performance is not FDA approved in patients less than 80 years old. Performed at [facility], [address], [place], [place] [address]       Radiology Studies: DG Abd 1 View Result Date: 01/27/2024 CLINICAL DATA:  Nausea and vomiting. EXAM: ABDOMEN - 1 VIEW COMPARISON:  None Available. FINDINGS: Bowel gas pattern is nonobstructive. Mild to moderate fecal retention over the rectum. No free peritoneal air. Degenerative change of the spine and hips. IMPRESSION: Nonobstructive bowel gas pattern with mild to moderate fecal retention over the rectum. Electronically Signed   By: Toribio Agreste M.D.   On: 01/27/2024 12:51      Scheduled Meds:  allopurinol   100 mg Oral Q0600   apixaban   5 mg Oral BID   atorvastatin   40 mg Oral QPM   lactulose   20 g Oral BID   metoprolol  tartrate  25 mg Oral BID   polyethylene glycol  17 g Oral Daily   sodium chloride  flush  3 mL Intravenous Q12H   Continuous Infusions:  ceFEPime  (MAXIPIME ) IV 2 g (01/28/24 0017)     LOS: 6 days   Time spent: 25 minutes   Delon Hoe, DO [facility] 01/28/2024, 11:04 AM   Available via Epic secure chat 7am-7pm After these hours, please refer to coverage provider listed on [email or web address]

## 2024-01-28 NOTE — Progress Notes (Signed)
 Inpatient Rehab Admissions Coordinator:   I met with Pt. To discuss potential CIR admit. He is interested and states that his daughter angel can [facility] ane be with him at d/c. I will call her to confirm. I will follow for potential admit.  Leita Kleine, MS, CCC-SLP Rehab Admissions Coordinator  [phone] (celll) [phone] (office)

## 2024-01-28 NOTE — Progress Notes (Addendum)
 Progress Note  Patient Name: William Hatfield Date of Encounter: 01/28/2024  Primary Cardiologist: [name] Sierras, MD  Subjective   Still notes SOB with any slight exertion, no overt chest pain. Has a sore on his buttocks identified by nursing.  Inpatient Medications    Scheduled Meds:  allopurinol   100 mg Oral Q0600   apixaban   5 mg Oral BID   atorvastatin   40 mg Oral QPM   lactulose   20 g Oral BID   metoprolol  tartrate  25 mg Oral BID   polyethylene glycol  17 g Oral Daily   sodium chloride  flush  3 mL Intravenous Q12H   Continuous Infusions:  ceFEPime  (MAXIPIME ) IV 2 g (01/28/24 0017)   PRN Meds: acetaminophen  **OR** acetaminophen , alum & mag hydroxide-simeth, bisacodyl , HYDROmorphone  (DILAUDID ) injection, methocarbamol , ondansetron  (ZOFRAN ) IV, oxyCODONE , senna, trimethobenzamide    Vital Signs    Vitals:   01/27/24 1226 01/27/24 1351 01/27/24 2126 01/28/24 0637  BP: 137/72 126/81 (!) 154/95 (!) 144/68  Pulse: 91 99 (!) 108 85  Resp: 16 16 17 16   Temp:  98.3 F (36.8 C) 97.9 F (36.6 C) 98.6 F (37 C)  TempSrc:  Oral Oral Oral  SpO2: 95%  97% 99%  Weight:      Height:        Intake/Output Summary (Last 24 hours) at 01/28/2024 1006 Last data filed at 01/28/2024 0900 Gross per 24 hour  Intake 1189.87 ml  Output 1225 ml  Net -35.13 ml      01/22/2024    5:03 PM 01/16/2024    6:36 AM 01/08/2024    1:00 PM  Last 3 Weights  Weight (lbs) 186 lb 15.2 oz 187 lb 187 lb  Weight (kg) 84.8 kg 84.823 kg 84.823 kg     Telemetry    A sensed V paced, as well as occasional AV pacing, rare PVC - Personally Reviewed  Physical Exam   GEN: No acute distress.  HEENT: Normocephalic, atraumatic, sclera non-icteric. Neck: No JVD or bruits. Cardiac: RRR no murmurs, rubs, or gallops.  Respiratory: Diffusely diminished bilaterally. Breathing is unlabored. GI: Soft, nontender, non-distended, BS +x 4. MS: no deformity. Extremities: No clubbing or cyanosis. Trace BLE edema.  Distal pedal pulses are 2+ and equal bilaterally. Neuro:  AAOx3. Follows commands. Psych:  Responds to questions appropriately with a normal affect.  Labs    High Sensitivity Troponin:  No results for input(s): TROPONINIHS in the last 720 hours.    Cardiac EnzymesNo results for input(s): TROPONINI in the last 168 hours. No results for input(s): TROPIPOC in the last 168 hours.   Chemistry Recent Labs  Lab 01/22/24 1405 01/23/24 0323 01/24/24 0618 01/27/24 0439 01/27/24 1702 01/28/24 0506  NA 137 137   < > 137 138 138  K 5.1 5.3*   < > 5.3* 5.0 4.9  CL 98 102   < > 105 106 106  CO2 24 24   < > 21* 21* 24  GLUCOSE 134* 110*   < > 142* 125* 106*  BUN 70* 64*   < > 33* 33* 33*  CREATININE 1.71* 1.60*   < > 1.07 1.07 1.12  CALCIUM  9.7 9.1   < > 9.3 9.4 9.0  PROT 7.2 6.5  --   --   --   --   ALBUMIN 3.7 3.2*  --   --   --   --   AST 41 45*  --   --   --   --   ALT  63* 53*  --   --   --   --   ALKPHOS 206* 206*  --   --   --   --   BILITOT 0.8 0.7  --   --   --   --   GFRNONAA 41* 44*   < > >60 >60 >60  ANIONGAP 15 11   < > 12 11 8    < > = values in this interval not displayed.     Hematology Recent Labs  Lab 01/26/24 0302 01/27/24 0439 01/28/24 0506  WBC 13.8* 15.7* 15.4*  RBC 3.14* 3.59* 3.54*  HGB 9.4* 10.7* 10.4*  HCT 29.6* 34.2* 33.1*  MCV 94.3 95.3 93.5  MCH 29.9 29.8 29.4  MCHC 31.8 31.3 31.4  RDW 14.9 14.8 14.9  PLT 371 452* 451*    BNPNo results for input(s): BNP, PROBNP in the last 168 hours.   DDimer  Recent Labs  Lab 01/23/24 1835  DDIMER 1.49*     Radiology    DG Abd 1 View Result Date: 01/27/2024 CLINICAL DATA:  Nausea and vomiting. EXAM: ABDOMEN - 1 VIEW COMPARISON:  None Available. FINDINGS: Bowel gas pattern is nonobstructive. Mild to moderate fecal retention over the rectum. No free peritoneal air. Degenerative change of the spine and hips. IMPRESSION: Nonobstructive bowel gas pattern with mild to moderate fecal retention over  the rectum. Electronically Signed   By: Toribio Agreste M.D.   On: 01/27/2024 12:51    Cardiac Studies   2d echo 01/24/24    1. Left ventricular ejection fraction, by estimation, is 50 to 55%. The  left ventricle has low normal function. The left ventricle has no regional  wall motion abnormalities. There is severe concentric left ventricular  hypertrophy. Left ventricular  diastolic parameters are indeterminate.   2. Right ventricular systolic function is normal. The right ventricular  size is normal.   3. The mitral valve is normal in structure. Mild to moderate mitral valve  regurgitation. No evidence of mitral stenosis.   4. The aortic valve has an indeterminant number of cusps. Aortic valve  regurgitation is not visualized. No aortic stenosis is present.   Patient Profile     78 y.o. male with CAD s/p CABG x4 (LIMA-LAD, SVG-PDA, SVG-OM1, SVG-D1) in 2006, NSTEMI 2018 with occluded SVG-OM1 with unsuccessful PCI managed medically, paroxysmal atrial fibrillation on Eliquis , complete heart block s/p PPM in 12/2019, carotid artery disease s/p CEA of left ICA in 04/2022 and TCAR of right ICA in 09/2022, hypertension, hyperlipidemia, CKD stage IIIa. Recently underwent L TKA 01/16/24. Readmitted 01/22/24 with poor PO intake, weakness, LLE edema, with concern for post-op infection. Orthopedics felt leg appearance looked c/w expected healing. Patient complained of increased SOB/chest discomfort on 01/23/24 with hsTroponin 348-350. CT scan was concerning for HCAP and small pleural effusions. VQ negative for PE, LE venous duplex neg for DVT. Hospital course also notable for AKI/hyperkalemia, possible orthostatic-type dizziness necessitating discontinuation of Imdur .   Assessment & Plan    1. Elevated Troponin, hx CAD s/p CABG Patient has a history of CAD s/p remote CABG x4 in 2006. LHC showed occluded SVG to OM1 but all other grafts were patent. He was admitted on 01/22/2024 for left lower extremity  cellulitis and pneumonia after recent knee surgery. On 11/26, he reported worsening shortness of breath followed by chest discomfort. High-sensitivity troponin elevated but flat at 348 >> 350. D-dimer elevated at 1.49 with negative VQ and LE venous duplex, changed back to Eliquis  - Per  notes, has been low suspicion for ischemic chest pain, suspect troponin elevation is demand ischemia related to multifocal pneumonia - Imdur  stopped this admission due to dizziness/concern for orthostasis - Still continues to note dyspnea with exertion - 2D echo shows reduction in LVEF to 50-55% from 60-65%, with new severe LVH (previously mild) - > will review further cardiac w/u with MD and med mgmt - was on Lasix  40mg  daily and Jardiance  10mg  daily PTA, not getting here. Will order I/O's and daily weights.   2. Paroxsymal Atrial Fibrillation, short NSVT this admission - He is in sinus rhythm, continue Lopressor  25mg  twice daily.  - Continue Eliquis  5 mg BID - No further NSVT seen, only occasional PVCs  3. Complete Heart Block s/p PPM S/p PPM in 2021.  - Followed by Dr. Almetta as an outpatient.  - Await input from MD re: BB - per nurse note regarding orthostatics yesterday - Orthostatic VS. Pt lying; BP: 126/81, HR 99, O2 97. Pt sitting; 128/82, HR 92, O2 98. Pt standing; 126/76, HR 97, O2-96.  4. Hypertension - mildly elevated, managing in context above  5. Hyperlipidemia - Continue Lipitor 40mg  daily. - LDL 31   6. AKI on CKD Stage IIIa Creatinine 1.71 on admission. Recent baseline around 1.3.  - Cr stable   Otherwise, per primary team: - Left leg cellulitis - Pneumonia - Question of early acute pancreatitis on CT  For questions or updates, please contact [facility] Please consult [email or web address] for contact info under Cardiology/STEMI.  Signed, Suprena Travaglini N Alphonzo Devera, PA-C 01/28/2024, 10:06 AM

## 2024-01-29 ENCOUNTER — Inpatient Hospital Stay (HOSPITAL_COMMUNITY)

## 2024-01-29 DIAGNOSIS — J189 Pneumonia, unspecified organism: Secondary | ICD-10-CM | POA: Diagnosis not present

## 2024-01-29 DIAGNOSIS — I480 Paroxysmal atrial fibrillation: Secondary | ICD-10-CM | POA: Diagnosis not present

## 2024-01-29 DIAGNOSIS — Z951 Presence of aortocoronary bypass graft: Secondary | ICD-10-CM | POA: Diagnosis not present

## 2024-01-29 DIAGNOSIS — I2510 Atherosclerotic heart disease of native coronary artery without angina pectoris: Secondary | ICD-10-CM | POA: Diagnosis not present

## 2024-01-29 LAB — BASIC METABOLIC PANEL WITH GFR
Anion gap: 12 (ref 5–15)
BUN: 45 mg/dL — ABNORMAL HIGH (ref 8–23)
CO2: 21 mmol/L — ABNORMAL LOW (ref 22–32)
Calcium: 9.6 mg/dL (ref 8.9–10.3)
Chloride: 105 mmol/L (ref 98–111)
Creatinine, Ser: 1.31 mg/dL — ABNORMAL HIGH (ref 0.61–1.24)
GFR, Estimated: 56 mL/min — ABNORMAL LOW (ref >60)
Glucose, Bld: 114 mg/dL — ABNORMAL HIGH (ref 70–99)
Potassium: 4.6 mmol/L (ref 3.5–5.1)
Sodium: 139 mmol/L (ref 135–145)

## 2024-01-29 LAB — URINALYSIS, ROUTINE W REFLEX MICROSCOPIC
Bilirubin Urine: NEGATIVE
Glucose, UA: NEGATIVE mg/dL
Ketones, ur: 5 mg/dL — AB
Leukocytes,Ua: NEGATIVE
Nitrite: NEGATIVE
Protein, ur: 100 mg/dL — AB
Specific Gravity, Urine: 1.024 (ref 1.005–1.030)
pH: 5 (ref 5.0–8.0)

## 2024-01-29 LAB — CBC
HCT: 38 % — ABNORMAL LOW (ref 39.0–52.0)
Hemoglobin: 11.7 g/dL — ABNORMAL LOW (ref 13.0–17.0)
MCH: 29.6 pg (ref 26.0–34.0)
MCHC: 30.8 g/dL (ref 30.0–36.0)
MCV: 96.2 fL (ref 80.0–100.0)
Platelets: 470 K/uL — ABNORMAL HIGH (ref 150–400)
RBC: 3.95 MIL/uL — ABNORMAL LOW (ref 4.22–5.81)
RDW: 15 % (ref 11.5–15.5)
WBC: 21.1 K/uL — ABNORMAL HIGH (ref 4.0–10.5)
nRBC: 0 % (ref 0.0–0.2)

## 2024-01-29 LAB — LIPASE, BLOOD: Lipase: 45 U/L (ref 11–51)

## 2024-01-29 LAB — PRO BRAIN NATRIURETIC PEPTIDE: Pro Brain Natriuretic Peptide: 16192 pg/mL — ABNORMAL HIGH (ref <300.0)

## 2024-01-29 MED ORDER — DOXYCYCLINE HYCLATE 100 MG PO TABS
100.0000 mg | ORAL_TABLET | Freq: Two times a day (BID) | ORAL | Status: DC
  Administered 2024-01-29 (×2): 100 mg via ORAL
  Filled 2024-01-29 (×2): qty 1

## 2024-01-29 MED ORDER — FUROSEMIDE 40 MG PO TABS
40.0000 mg | ORAL_TABLET | Freq: Every day | ORAL | Status: DC
  Administered 2024-01-30: 40 mg via ORAL
  Filled 2024-01-29: qty 1

## 2024-01-29 NOTE — PMR Pre-admission (Signed)
 PMR Admission Coordinator Pre-Admission Assessment  Patient: William Hatfield is an 78 y.o., male MRN: [id number] DOB: 1945/09/18 Height: 5' 11 (180.3 cm) Weight: 84.9 kg  Insurance Information HMO:     PPO:      PCP:      IPA:      80/20: yes     OTHER:  PRIMARY:  Medicare Part A and B      Policy#:  [id number]             Subscriber: Pt CM Name:       Phone#:      Fax#:  Pre-Cert#:       Employer:  Benefits:  Phone #:      Name:  Eff. Date: A and B effective 04/27/12     Deduct: $1,676   Pocket Max: NA      Life Max: Na CIR: 100% coverage      SNF: 100% coverage for days 1-20, 80% coverage for days 21-100 Outpatient: 80% coverage     Co-Pay: 20% Home Health: 100% coverage      Co-Pay:  DME: 80% coverage     Co-Pay: 20% Providers: pt's choice  SECONDARY: AARP      Policy#: [id number]      Phone#: ***  Financial Counselor:       Phone#:   The "Data Collection Information Summary" for patients in Inpatient Rehabilitation Facilities with attached "Privacy Act Statement-Health Care Records" was provided and verbally reviewed with:   Emergency Contact Information Contact Information     Name Relation Home Work Mobile   [place] Daughter   [phone]   [facility] Daughter   [phone]      Other Contacts   None on File     Current Medical History  Patient Admitting Diagnosis: PNA, debility History of Present Illness: William Hatfield is a 78 year old right-handed male with history significant for hypertension, CHB status post PPM 2021, CAD status post CABG at [facility] 2006, atrial fibrillation maintained on Eliquis , CKD stage III, nonessential tremor question preparkinsonian, back surgery x 2 with fusion, left carotid enterectomy 05/22/2022 per Dr. Fonda Simpers and TCAR of the right ICA 8/24, as well as recent left knee total arthroplasty 01/16/2024 per Dr. Reyes Billing.  Per chart review patient lives alone.  1 level home with level entry.  Ambulates without  assistive device and still drives.  Patient has been using a rolling walker since recent knee surgery.  His daughter plans to assist as needed.  Presented to [facility]/22/2024 with fever, nausea, fatigue and left leg redness with swelling over 48 hours.  In the ED patient afebrile saturating well on room air labs most notable for creatinine 1.71 WBC 13,200, ALT 63, alkaline phosphatase 206, normal lactic acid and hemoglobin 10.9, urinalysis negative, troponin 348-350, D-dimer 1.49.  Chest x-ray opacity at the left midlung which could reflect atelectasis and/or developing bronchial pneumonia.  Plain radiographs of the left leg revealed soft tissue swelling and well aligned knee arthroplasty without acute fracture or dislocation, blood cultures no growth to date.  Patient was initially placed on empiric antibiotic coverage and currently completing a 7-day course of Maxipime  for suspect left lower extremity cellulitis/HCAP.  He continued to have some leukocytosis 15,400.  Venous Doppler studies negative for DVT at the left lower extremity.  CT of the chest findings consistent with multifocal pneumonia with patchy infiltrates in the upper lungs and left lingula and basilar atelectasis or consolidation greater on the left.  Small bilateral pleural effusions..  Nuclear medicine pulmonary perfusion scan showed no pulmonary emboli.  He remains on chronic Eliquis  therapy.  Cardiology service follow-up for elevated troponin felt to be related to demand ischemia versus related to pneumonia.  Echocardiogram showed ejection fraction of 50 to 55% no wall motion abnormalities.  Patient did have some persistent dyspnea with minimal exertion possibly related to pneumonia and/or deconditioning with infrequent PVCs on beta-blocker he did receive Lasix  40 mg IV x 1 01/28/2024.  BP remains soft in the 90s with ambulation.  Renal function remained stable to latest creatinine 1.12 with Jardiance  and losartan  currently on  hold.  Therapies have been initiated patient is weightbearing as tolerated left lower extremity.  Patient was admitted for a comprehensive rehab program.     Patient's medical record from [facility]  has been reviewed by the rehabilitation admission coordinator and physician.  Past Medical History  Past Medical History:  Diagnosis Date   Anginal pain    Arthritis    Atrial fibrillation (HCC)    a. post op in 2006  b. recurrence on 08/2016 admission for NSTEMI   CAD (coronary artery disease)    a. 2006: s/p CABG at [facility] with LIMA to LAD, SVG to PDA, SVG to OM1, SVG to D1  b. 08/2016: NSTEMI 09/18/16 which showed severe native 3V CAD with patent SVG-->PLA, LIMA-->LAD and total occlusion of SVG--> OM1 with unsuccessful attempt at PCI of SVG--> OM with inability to restore flow.    Cancer (HCC)    Rt. arm,head squamus.   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   Essential hypertension    Myocardial infarction (HCC) 09/18/2016   Peripheral vascular disease    Pneumonia 2020   COVID PNA   Presence of permanent cardiac pacemaker    Tremor     Has the patient had major surgery during 100 days prior to admission? No  Family History   family history includes Heart attack in his father and paternal grandfather.  Current Medications  Current Facility-Administered Medications:    acetaminophen  (TYLENOL ) tablet 650 mg, 650 mg, Oral, Q6H PRN, 650 mg at 01/28/24 1107 **OR** acetaminophen  (TYLENOL ) suppository 650 mg, 650 mg, Rectal, Q6H PRN, Opyd, Evalene RAMAN, MD   allopurinol  (ZYLOPRIM ) tablet 100 mg, 100 mg, Oral, Q0600, Opyd, Timothy S, MD, 100 mg at 01/29/24 0503   alum & mag hydroxide-simeth (MAALOX/MYLANTA) 200-200-20 MG/5ML suspension 30 mL, 30 mL, Oral, Q6H PRN, Rojelio Nest, DO, 30 mL at 01/26/24 2337   apixaban  (ELIQUIS ) tablet 5 mg, 5 mg, Oral, BID, Hilty, Vinie BROCKS, MD, 5 mg at  01/29/24 9191   atorvastatin  (LIPITOR) tablet 40 mg, 40 mg, Oral, QPM, Opyd, Timothy S, MD, 40 mg at 01/28/24 1732   bisacodyl  (DULCOLAX) suppository 10 mg, 10 mg, Rectal, Daily PRN, Rojelio Nest, DO, 10 mg at 01/27/24 1327   ceFEPIme  (MAXIPIME ) 2 g in sodium chloride  0.9 % 100 mL IVPB, 2 g, Intravenous, Q12H, [place], Ellouise SQUIBB, MD, Stopping previously hung infusion at 01/29/24 0809   docusate sodium  (COLACE) capsule 100 mg, 100 mg, Oral, BID, Rojelio Nest, DO, 100 mg at 01/29/24 9191   HYDROmorphone  (DILAUDID ) injection 1 mg, 1 mg, Intravenous, Q4H PRN, Rojelio Nest, DO, 1 mg at 01/27/24 1216   methocarbamol  (ROBAXIN ) tablet 500 mg, 500 mg, Oral, Q6H PRN, Opyd, Evalene RAMAN, MD,  500 mg at 01/28/24 2357   metoprolol  tartrate (LOPRESSOR ) tablet 25 mg, 25 mg, Oral, BID, Opyd, Timothy S, MD, 25 mg at 01/29/24 9191   ondansetron  (ZOFRAN ) injection 4 mg, 4 mg, Intravenous, Q6H PRN, Rojelio Nest, DO, 4 mg at 01/27/24 1521   oxyCODONE  (Oxy IR/ROXICODONE ) immediate release tablet 5 mg, 5 mg, Oral, Q4H PRN, Opyd, Timothy S, MD, 5 mg at 01/29/24 0505   polyethylene glycol (MIRALAX  / GLYCOLAX ) packet 17 g, 17 g, Oral, Daily, Rojelio Nest, DO, 17 g at 01/29/24 9191   senna (SENOKOT) tablet 8.6 mg, 1 tablet, Oral, QHS, Rojelio Nest, DO, 8.6 mg at 01/28/24 2021   sodium chloride  flush (NS) 0.9 % injection 3 mL, 3 mL, Intravenous, Q12H, Opyd, Timothy S, MD, 3 mL at 01/29/24 0809   trimethobenzamide  (TIGAN ) injection 200 mg, 200 mg, Intramuscular, Q6H PRN, Opyd, Timothy S, MD, 200 mg at 01/27/24 9048  Patients Current Diet:  Diet Order             Diet regular Room service appropriate? Yes; Fluid consistency: Thin; Fluid restriction: 2000 mL Fluid  Diet effective now                   Precautions / Restrictions Precautions Precautions: Fall, Knee Precaution/Restrictions Comments: Reviewed importance of terminal knee extension, elevation in extension  no pillow behind  knee Restrictions Weight Bearing Restrictions Per Provider Order: No LLE Weight Bearing Per Provider Order: Weight bearing as tolerated   Has the patient had 2 or more falls or a fall with injury in the past year? No  Prior Activity Level Community (5-7x/wk): Active in the community  Prior Functional Level Self Care: Did the patient need help bathing, dressing, using the toilet or eating? Independent  Indoor Mobility: Did the patient need assistance with walking from room to room (with or without device)? Independent  Stairs: Did the patient need assistance with internal or external stairs (with or without device)? Independent  Functional Cognition: Did the patient need help planning regular tasks such as shopping or remembering to take medications? Independent  Patient Information Are you of Hispanic, Latino/a,or Spanish origin?: A. No, not of Hispanic, Latino/a, or Spanish origin What is your race?: A. White Do you need or want an interpreter to communicate with a doctor or health care staff?: 0. No  Patient's Response To:  Health Literacy and Transportation Is the patient able to respond to health literacy and transportation needs?: Yes Health Literacy - How often do you need to have someone help you when you read instructions, pamphlets, or other written material from your doctor or pharmacy?: Never In the past 12 months, has lack of transportation kept you from medical appointments or from getting medications?: No In the past 12 months, has lack of transportation kept you from meetings, work, or from getting things needed for daily living?: No  Home Assistive Devices / Equipment Home Equipment: Rollator (4 wheels), Cane - single point, Agricultural Consultant (2 wheels)  Prior Device Use: Indicate devices/aids used by the patient prior to current illness, exacerbation or injury? None of the above  Current Functional Level Cognition  Orientation Level: Oriented X4    Extremity  Assessment (includes Sensation/Coordination)  Upper Extremity Assessment: Overall WFL for tasks assessed  LLE Deficits / Details: ankle WFL, knee extension 2+/5,  hip flexion 2+/5, ~10 degree quad lag. MMT limited by pain    ADLs  Overall ADL's : Needs assistance/impaired Eating/Feeding: Set up, Sitting Grooming: Set up, Sitting  Upper Body Bathing: Minimal assistance, Sitting Lower Body Bathing: Sit to/from stand, Maximal assistance Upper Body Dressing : Minimal assistance, Sitting Lower Body Dressing: Sit to/from stand, Cueing for sequencing, Cueing for safety, Maximal assistance Toilet Transfer: Moderate assistance Toilet Transfer Details (indicate cue type and reason): bed to chair Toileting- Clothing Manipulation and Hygiene: Moderate assistance, Sit to/from stand, Sitting/lateral lean    Mobility  Overal bed mobility: Needs Assistance Bed Mobility: Supine to Sit Supine to sit: Contact guard, HOB elevated, Used rails Sit to supine: Contact guard assist, HOB elevated, Used rails General bed mobility comments: Increased time and effort. Pt practiced using gait belt as leg lifter to assist L LE. Cues provided as needed.    Transfers  Overall transfer level: Needs assistance Equipment used: Rolling walker (2 wheels) Transfers: Sit to/from Stand Sit to Stand: Min assist Bed to/from chair/wheelchair/BSC transfer type:: Stand pivot Stand pivot transfers: Min assist Step pivot transfers: Min assist General transfer comment: Cues for safety, technique, hand/LE placement, and for pt to try to get L heel down on floor. Assist needed to power up, stabilize, posiiton L LE, control descent. Increased time and effort for pt. L knee in flexed position. Fall risk. Moderate dyspnea and significant pain reported by pt.    Ambulation / Gait / Stairs / Wheelchair Mobility  Ambulation/Gait Ambulation/Gait assistance: Min assist, +2 safety/equipment Gait Distance (Feet): 10 Feet (10'x1;  5'x1) Assistive device: Rolling walker (2 wheels) Gait Pattern/deviations: Step-to pattern General Gait Details: Cues for safety, technique, sequencing, heel to floor on L. Assist to stabilize pt throughout short distance. Followed closely with recliner. Multiple seated rest breaks taken 2* fatigue, dyspnea, dizziness, fatigue, pain.  O2 96% on RA, HR 54 bpm, BP 99/54    Posture / Balance Balance Overall balance assessment: Needs assistance Sitting-balance support: No upper extremity supported, Feet supported Sitting balance-Leahy Scale: Fair Standing balance support: Bilateral upper extremity supported, During functional activity, Reliant on assistive device for balance Standing balance-Leahy Scale: Poor Standing balance comment: unable    Special considerations/life events  Special service needs IV ABX   Previous Home Environment (from acute therapy documentation) Living Arrangements: Alone Available Help at Discharge: Family, Available 24 hours/day Type of Home: House Home Layout: One level Home Access: Level entry Bathroom Shower/Tub: Engineer, Manufacturing Systems: Handicapped height Bathroom Accessibility: No Home Care Services: Yes Type of Home Care Services: Home OT, Home PT Additional Comments: 2 daughters to assist, one is [employer]IS[employer]  Discharge Living Setting Plans for Discharge Living Setting: House Type of Home at Discharge: House Discharge Home Layout: One level Discharge Home Access: Level entry Discharge Bathroom Shower/Tub: Tub/shower unit Discharge Bathroom Toilet: Handicapped height Discharge Bathroom Accessibility: Yes How Accessible: Accessible via walker Does the patient have any problems obtaining your medications?: No  Social/Family/Support Systems Patient Roles: Other (Comment) Contact Information: [phone] Anticipated Caregiver: [name] (daughter) Caregiver Availability: 24/7 Discharge Plan Discussed with Primary Caregiver: Yes Is Caregiver In  Agreement with Plan?: Yes  Goals Patient/Family Goal for Rehab: PT/OT Supervisoin Expected length of stay: 10-12 days Pt/Family Agrees to Admission and willing to participate: Yes Program Orientation Provided & Reviewed with Pt/Caregiver Including Roles  & Responsibilities: Yes  Decrease burden of Care through IP rehab admission: Not anticipated  Possible need for SNF placement upon discharge: Not anticipated  Patient Condition: I have reviewed medical records from [facility], spoken with CM, and patient and daughter. I discussed via phone for inpatient rehabilitation assessment.  Patient will benefit from ongoing PT  and OT, can actively participate in 3 hours of therapy a day 5 days of the week, and can make measurable gains during the admission.  Patient will also benefit from the coordinated team approach during an Inpatient Acute Rehabilitation admission.  The patient will receive intensive therapy as well as Rehabilitation physician, nursing, social worker, and care management interventions.  Due to safety, skin/wound care, disease management, medication administration, pain management, and patient education the patient requires 24 hour a day rehabilitation nursing.  The patient is currently min A with mobility and basic ADLs.  Discharge setting and therapy post discharge at home with home health is anticipated.  Patient has agreed to participate in the Acute Inpatient Rehabilitation Program and will admit {Time; today/tomorrow:10263}.  Preadmission Screen Completed By:  Leita KATHEE Kleine, 01/29/2024 9:12 AM ______________________________________________________________________   Discussed status with Dr. PIERRETTE on *** at *** and received approval for admission today.  Admission Coordinator:  Leita KATHEE Kleine, CCC-SLP, time PIERRETTEPattricia ***   Assessment/Plan: Diagnosis: *** Does the need for close, 24 hr/day Medical supervision in concert with the patient's rehab needs make it unreasonable  for this patient to be served in a less intensive setting? {yes_no_potentially:3041433} Co-Morbidities requiring supervision/potential complications: *** Due to {due un:6958565}, does the patient require 24 hr/day rehab nursing? {yes_no_potentially:3041433} Does the patient require coordinated care of a physician, rehab nurse, PT, OT, and SLP to address physical and functional deficits in the context of the above medical diagnosis(es)? {yes_no_potentially:3041433} Addressing deficits in the following areas: {deficits:3041436} Can the patient actively participate in an intensive therapy program of at least 3 hrs of therapy 5 days a week? {yes_no_potentially:3041433} The potential for patient to make measurable gains while on inpatient rehab is {potential:3041437} Anticipated functional outcomes upon discharge from inpatient rehab: {functional outcomes:304600100} PT, {functional outcomes:304600100} OT, {functional outcomes:304600100} SLP Estimated rehab length of stay to reach the above functional goals is: *** Anticipated discharge destination: {anticipated dc setting:21604} 10. Overall Rehab/Functional Prognosis: {potential:3041437}   MD Signature: ***

## 2024-01-29 NOTE — Progress Notes (Signed)
 Progress Note  Patient Name: William Hatfield Date of Encounter: 01/29/2024  Primary Cardiologist: [name] Sierras, MD  Subjective   Nausea is better- creatinine up to 1.31 today - given lasix  yesterday, but only negative 394 mL. proBNP 16,192, suggests that he remains volume overloaded.  Inpatient Medications    Scheduled Meds:  allopurinol   100 mg Oral Q0600   apixaban   5 mg Oral BID   atorvastatin   40 mg Oral QPM   docusate sodium   100 mg Oral BID   doxycycline  100 mg Oral Q12H   metoprolol  tartrate  25 mg Oral BID   polyethylene glycol  17 g Oral Daily   senna  1 tablet Oral QHS   sodium chloride  flush  3 mL Intravenous Q12H   Continuous Infusions:  ceFEPime  (MAXIPIME ) IV 2 g (01/29/24 1023)   PRN Meds: acetaminophen  **OR** acetaminophen , alum & mag hydroxide-simeth, bisacodyl , HYDROmorphone  (DILAUDID ) injection, methocarbamol , ondansetron  (ZOFRAN ) IV, oxyCODONE , trimethobenzamide    Vital Signs    Vitals:   01/28/24 2106 01/29/24 0456 01/29/24 0700 01/29/24 1200  BP: 138/63 (!) 144/72    Pulse: 62 60    Resp: 14 15    Temp: 97.9 F (36.6 C) 98.3 F (36.8 C)    TempSrc: Oral Oral    SpO2: 100% 100%  100%  Weight:   84.9 kg   Height:        Intake/Output Summary (Last 24 hours) at 01/29/2024 1250 Last data filed at 01/29/2024 0842 Gross per 24 hour  Intake 783.17 ml  Output 1275 ml  Net -491.83 ml      01/29/2024    7:00 AM 01/28/2024   10:22 AM 01/22/2024    5:03 PM  Last 3 Weights  Weight (lbs) 187 lb 2 oz 187 lb 4.5 oz 186 lb 15.2 oz  Weight (kg) 84.88 kg 84.95 kg 84.8 kg     Telemetry    A sensed V paced, as well as occasional AV pacing, rare PVC - Personally Reviewed  Physical Exam   GEN: No acute distress.  HEENT: Normocephalic, atraumatic, sclera non-icteric. Neck: No JVD or bruits. Cardiac: RRR no murmurs, rubs, or gallops.  Respiratory: Diffusely diminished bilaterally. Breathing is unlabored. GI: Soft, nontender, non-distended, BS +x  4. MS: no deformity. Extremities: No clubbing or cyanosis. Trace BLE edema. Distal pedal pulses are 2+ and equal bilaterally. Neuro:  AAOx3. Follows commands. Psych:  Responds to questions appropriately with a normal affect.  Labs    High Sensitivity Troponin:  No results for input(s): TROPONINIHS in the last 720 hours.    Cardiac EnzymesNo results for input(s): TROPONINI in the last 168 hours. No results for input(s): TROPIPOC in the last 168 hours.   Chemistry Recent Labs  Lab 01/22/24 1405 01/23/24 0323 01/24/24 0618 01/27/24 1702 01/28/24 0506 01/29/24 0443  NA 137 137   < > 138 138 139  K 5.1 5.3*   < > 5.0 4.9 4.6  CL 98 102   < > 106 106 105  CO2 24 24   < > 21* 24 21*  GLUCOSE 134* 110*   < > 125* 106* 114*  BUN 70* 64*   < > 33* 33* 45*  CREATININE 1.71* 1.60*   < > 1.07 1.12 1.31*  CALCIUM  9.7 9.1   < > 9.4 9.0 9.6  PROT 7.2 6.5  --   --   --   --   ALBUMIN 3.7 3.2*  --   --   --   --  AST 41 45*  --   --   --   --   ALT 63* 53*  --   --   --   --   ALKPHOS 206* 206*  --   --   --   --   BILITOT 0.8 0.7  --   --   --   --   GFRNONAA 41* 44*   < > >60 >60 56*  ANIONGAP 15 11   < > 11 8 12    < > = values in this interval not displayed.     Hematology Recent Labs  Lab 01/27/24 0439 01/28/24 0506 01/29/24 0443  WBC 15.7* 15.4* 21.1*  RBC 3.59* 3.54* 3.95*  HGB 10.7* 10.4* 11.7*  HCT 34.2* 33.1* 38.0*  MCV 95.3 93.5 96.2  MCH 29.8 29.4 29.6  MCHC 31.3 31.4 30.8  RDW 14.8 14.9 15.0  PLT 452* 451* 470*    BNP Recent Labs  Lab 01/29/24 0443  PROBNP 16,192.0*     DDimer  Recent Labs  Lab 01/23/24 1835  DDIMER 1.49*     Radiology    DG CHEST PORT 1 VIEW Result Date: 01/29/2024 EXAM: 1 VIEW(S) XRAY OF THE CHEST 01/29/2024 08:01:00 AM COMPARISON: 01/25/2024 CLINICAL HISTORY: Pneumonia FINDINGS: LINES, TUBES AND DEVICES: Left chest cardiac pacing device. LUNGS AND PLEURA: Low lung volumes. Trace bilateral pleural effusions. Mild pulmonary  edema with similar to mildly decreased patchy opacities in left mid to lower lung. No pneumothorax. HEART AND MEDIASTINUM: Post median sternotomy and CABG. Atherosclerotic calcifications. No acute abnormality of the cardiac and mediastinal silhouettes. BONES AND SOFT TISSUES: Post median sternotomy. No acute osseous abnormality. IMPRESSION: 1. Mild pulmonary edema with similar to mildly decreased patchy opacities in the left mid to lower lung, consistent with pneumonia. 2. Trace bilateral pleural effusions. Electronically signed by: Waddell Calk MD 01/29/2024 08:14 AM EST RP Workstation: [id number]    Cardiac Studies   2d echo 01/24/24    1. Left ventricular ejection fraction, by estimation, is 50 to 55%. The  left ventricle has low normal function. The left ventricle has no regional  wall motion abnormalities. There is severe concentric left ventricular  hypertrophy. Left ventricular  diastolic parameters are indeterminate.   2. Right ventricular systolic function is normal. The right ventricular  size is normal.   3. The mitral valve is normal in structure. Mild to moderate mitral valve  regurgitation. No evidence of mitral stenosis.   4. The aortic valve has an indeterminant number of cusps. Aortic valve  regurgitation is not visualized. No aortic stenosis is present.   Patient Profile     78 y.o. male with CAD s/p CABG x4 (LIMA-LAD, SVG-PDA, SVG-OM1, SVG-D1) in 2006, NSTEMI 2018 with occluded SVG-OM1 with unsuccessful PCI managed medically, paroxysmal atrial fibrillation on Eliquis , complete heart block s/p PPM in 12/2019, carotid artery disease s/p CEA of left ICA in 04/2022 and TCAR of right ICA in 09/2022, hypertension, hyperlipidemia, CKD stage IIIa. Recently underwent L TKA 01/16/24. Readmitted 01/22/24 with poor PO intake, weakness, LLE edema, with concern for post-op infection. Orthopedics felt leg appearance looked c/w expected healing. Patient complained of increased SOB/chest  discomfort on 01/23/24 with hsTroponin 348-350. CT scan was concerning for HCAP and small pleural effusions. VQ negative for PE, LE venous duplex neg for DVT. Hospital course also notable for AKI/hyperkalemia, possible orthostatic-type dizziness necessitating discontinuation of Imdur .   Assessment & Plan    1. Elevated Troponin, hx CAD s/p CABG Patient has a history  of CAD s/p remote CABG x4 in 2006. LHC showed occluded SVG to OM1 but all other grafts were patent. He was admitted on 01/22/2024 for left lower extremity cellulitis and pneumonia after recent knee surgery. On 11/26, he reported worsening shortness of breath followed by chest discomfort. High-sensitivity troponin elevated but flat at 348 >> 350. D-dimer elevated at 1.49 with negative VQ and LE venous duplex, changed back to Eliquis  - Per notes, has been low suspicion for ischemic chest pain, suspect troponin elevation is demand ischemia related to multifocal pneumonia - Imdur  stopped this admission due to dizziness/concern for orthostasis - Still continues to note dyspnea with exertion - Was negative 394 mL overnight.  - Resume home oral lasix  40 mg daily.  2. Paroxsymal Atrial Fibrillation, short NSVT this admission - He is in sinus rhythm, continue Lopressor  25mg  twice daily.  - Continue Eliquis  5 mg BID - No further NSVT seen, only occasional PVCs - ok to d/c telemetry  3. Complete Heart Block s/p PPM S/p PPM in 2021.  - Followed by Dr. Almetta as an outpatient.  - Await input from MD re: BB - per nurse note regarding orthostatics yesterday - Orthostatic VS. Pt lying; BP: 126/81, HR 99, O2 97. Pt sitting; 128/82, HR 92, O2 98. Pt standing; 126/76, HR 97, O2-96. - telemetry stable, paced, will d/c  4. Hypertension - mildly elevated, managing in context above  5. Hyperlipidemia - Continue Lipitor 40mg  daily. - LDL 31   6. AKI on CKD Stage IIIa Creatinine 1.71 on admission. Recent baseline around 1.3.  - Cr stable    Otherwise, per primary team: - Left leg cellulitis - Pneumonia - Question of early acute pancreatitis on CT  Ok from cardiac standpoint to transition to rehab once medical issues have resolved. Will arrange earlier follow-up with Dr. Debera or APP after discharge.  [facility] will sign off.   Medication Recommendations:  as above Other recommendations (labs, testing, etc):  none Follow up as an outpatient:  Dr. Debera or APP   For questions or updates, please contact [facility] Please consult [email or web address] for contact info under Cardiology/STEMI.  Vinie KYM Maxcy, MD, [facility], FNLA, FACP  [place]  [facility]  Medical Director of the [facility]  [facility] Diplomate of the American Board of Clinical Lipidology Attending Cardiologist  Direct Dial: [phone]  Fax: [phone]  Website:  [email or web address]  Vinie [name] Maxcy, MD 01/29/2024, 12:50 PM

## 2024-01-29 NOTE — Progress Notes (Addendum)
 PROGRESS NOTE    William Hatfield  FMW:[id number] DOB: Oct 03, 1945 DOA: 01/22/2024 PCP: Henriette Anes, DO     Brief Narrative:  William Hatfield is a 78 y.o. 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   Patient began feeling generally poor with nausea, fever, chills, and fatigue 2 or 3 days ago.  His daughter noted today that the left knee appeared red and swollen.  Patient has resumed full dose Eliquis  since the procedure.   Patient was admitted for suspected left lower extremity cellulitis and started on broad-spectrum antibiotics.  Orthopedic surgery evaluated patient, did not feel erythema was secondary to cellulitis.  Patient was ultimately diagnosed with pneumonia.  Due to complaints of chest pain and history of CAD, cardiology was consulted.  VQ scan was negative for PE.  New events last 24 hours / Subjective: No longer nauseous. Was able to eat half of his breakfast tray this morning. WBC up today   Assessment & Plan:   Principal Problem:   HCAP (healthcare-associated pneumonia) Active Problems:   Hypertension   CAD (coronary artery disease)   Hx of CABG   Dyslipidemia   PAF (paroxysmal atrial fibrillation) (HCC)   Left leg cellulitis   Acute renal failure superimposed on stage 3a chronic kidney disease (HCC)   HCAP  - Completed 7 days of cefepime .  MRSA PCR negative, vanco discontinued   Leukocytosis - Initial improvement after cefepime  for HCAP. However, worsened overnight. Blood culture, CXR, UA ordered. Added doxycycline to cover his pressure wound   ? Pancreatitis - On 11/26, CT chest had shown possible early pancreatitis. At that time, patient had no symptoms such as abdominal pain to indicate pancreatitis. Now with elevated WBC and nausea the other day,  will check lipase. If elevated, may proceed with CT abdomen   Left leg cellulitis, ruled out - Evaluated by orthopedic surgery, they do not feel that the erythema is from an infection, but rather expected postop changes - Venous duplex lower extremity negative for DVT  Elevated D-dimer - Unable to obtain CTA chest due to AKI - VQ scan negative  Chest pain, with mildly elevated troponin History of CAD status post CABG History dCHF Complete heart block status post pacemaker in 2021 - PTA: Lipitor, Imdur , metoprolol  - Echocardiogram without wall motion abnormality - Cardiology following.  Imdur  now on hold - Orthostatic was negative - Given 1 IV lasix  12/1   AKI on CKD 3A - Baseline creatinine 1.3 - Held losartan , Jardiance  - Improved and now back to baseline  Paroxysmal A-fib - Eliquis    Constipation - Bowel regimen  Stage 3 pressure injury coccyx/buttocks, not POA  - Wound RN consulted for wound care    DVT prophylaxis: Eliquis   Code Status: Full code Family Communication: Daughter updated by phone today   Disposition Plan: CIR Status is: Inpatient Remains inpatient appropriate because: CIR when improved  Antimicrobials:  Anti-infectives (From admission, onward)    Start     Dose/Rate Route Frequency Ordered Stop   01/29/24 1215  doxycycline (VIBRA-TABS) tablet 100 mg        100 mg Oral Every 12 hours 01/29/24 1120     01/25/24 0600  vancomycin  (VANCOREADY) IVPB 1500 mg/300 mL  Status:  Discontinued        1,500 mg 150 mL/hr over 120 Minutes Intravenous  Every 24 hours 01/24/24 0742 01/24/24 1225   01/24/24 0600  vancomycin  (VANCOREADY) IVPB 1250 mg/250 mL  Status:  Discontinued        1,250 mg 166.7 mL/hr over 90 Minutes Intravenous Every 36 hours 01/22/24 1722 01/24/24 0741   01/23/24 1215  ceFEPIme  (MAXIPIME ) 2 g in sodium chloride  0.9 % 100 mL IVPB        2 g 200 mL/hr over 30 Minutes Intravenous Every 12 hours 01/23/24 1122 01/30/24 1214   01/22/24 2300   cefTRIAXone  (ROCEPHIN ) 1 g in sodium chloride  0.9 % 100 mL IVPB  Status:  Discontinued        1 g 200 mL/hr over 30 Minutes Intravenous Every 24 hours 01/22/24 1707 01/23/24 1122   01/22/24 1545  vancomycin  (VANCOCIN ) IVPB 1000 mg/200 mL premix        1,000 mg 200 mL/hr over 60 Minutes Intravenous  Once 01/22/24 1539 01/22/24 1840   01/22/24 1545  ceFAZolin  (ANCEF ) IVPB 1 g/50 mL premix        1 g 100 mL/hr over 30 Minutes Intravenous  Once 01/22/24 1539 01/22/24 1621        Objective: Vitals:   01/28/24 1346 01/28/24 2106 01/29/24 0456 01/29/24 0700  BP: 126/61 138/63 (!) 144/72   Pulse: 60 62 60   Resp: 16 14 15    Temp: 97.6 F (36.4 C) 97.9 F (36.6 C) 98.3 F (36.8 C)   TempSrc: Oral Oral Oral   SpO2: 95% 100% 100%   Weight:    84.9 kg  Height:        Intake/Output Summary (Last 24 hours) at 01/29/2024 1157 Last data filed at 01/29/2024 0842 Gross per 24 hour  Intake 783.17 ml  Output 1275 ml  Net -491.83 ml   Filed Weights   01/22/24 1703 01/28/24 1022 01/29/24 0700  Weight: 84.8 kg 84.9 kg 84.9 kg   Examination: General exam: Appears calm and comfortable  Respiratory system: Clear to auscultation. Respiratory effort normal. Cardiovascular system: S1 & S2 heard, RRR. +nonpitting pedal edema. Gastrointestinal system: Abdomen is nondistended, soft and nontender. Normal bowel sounds heard. Central nervous system: Alert and oriented. Non focal exam. Speech clear  Extremities: Left knee with incision, staples in tact  Psychiatry: Judgement and insight appear stable. Mood & affect appropriate.      Data Reviewed: I have personally reviewed following labs and imaging studies  CBC: Recent Labs  Lab 01/22/24 1405 01/23/24 0323 01/24/24 0618 01/25/24 0326 01/26/24 0302 01/27/24 0439 01/28/24 0506 01/29/24 0443  WBC 13.2* 12.7*   < > 14.7* 13.8* 15.7* 15.4* 21.1*  NEUTROABS 10.5* 9.5*  --   --   --   --   --   --   HGB 10.9* 10.2*   < > 10.2* 9.4* 10.7*  10.4* 11.7*  HCT 33.5* 32.4*   < > 32.4* 29.6* 34.2* 33.1* 38.0*  MCV 92.3 95.0   < > 95.0 94.3 95.3 93.5 96.2  PLT 317 285   < > 389 371 452* 451* 470*   < > = values in this interval not displayed.   Basic Metabolic Panel: Recent Labs  Lab 01/26/24 0302 01/27/24 0439 01/27/24 1702 01/28/24 0506 01/29/24 0443  NA 136 137 138 138 139  K 4.6 5.3* 5.0 4.9 4.6  CL 106 105 106 106 105  CO2 22 21* 21* 24 21*  GLUCOSE 128* 142* 125* 106* 114*  BUN 36* 33* 33* 33* 45*  CREATININE 1.09 1.07 1.07 1.12 1.31*  CALCIUM  8.5* 9.3 9.4 9.0 9.6   GFR: Estimated Creatinine Clearance: 51.1 mL/min (A) (by C-G formula based on SCr of 1.31 mg/dL (H)). Liver Function Tests: Recent Labs  Lab 01/22/24 1405 01/23/24 0323  AST 41 45*  ALT 63* 53*  ALKPHOS 206* 206*  BILITOT 0.8 0.7  PROT 7.2 6.5  ALBUMIN 3.7 3.2*   No results for input(s): LIPASE, AMYLASE in the last 168 hours. No results for input(s): AMMONIA in the last 168 hours. Coagulation Profile: No results for input(s): INR, PROTIME in the last 168 hours. Cardiac Enzymes: No results for input(s): CKTOTAL, CKMB, CKMBINDEX, TROPONINI in the last 168 hours. BNP (last 3 results) Recent Labs    01/29/24 0443  PROBNP 16,192.0*   HbA1C: No results for input(s): HGBA1C in the last 72 hours. CBG: No results for input(s): GLUCAP in the last 168 hours. Lipid Profile: No results for input(s): CHOL, HDL, LDLCALC, TRIG, CHOLHDL, LDLDIRECT in the last 72 hours.  Thyroid Function Tests: No results for input(s): TSH, T4TOTAL, FREET4, T3FREE, THYROIDAB in the last 72 hours. Anemia Panel: No results for input(s): VITAMINB12, FOLATE, FERRITIN, TIBC, IRON, RETICCTPCT in the last 72 hours. Sepsis Labs: Recent Labs  Lab 01/22/24 1618  LATICACIDVEN 1.7    Recent Results (from the past 240 hours)  Culture, blood (routine x 2)     Status: None   Collection Time: 01/22/24  4:10 PM    Specimen: BLOOD  Result Value Ref Range Status   Specimen Description   Final    BLOOD LEFT ANTECUBITAL Performed at [facility], [address], [place], [place] [address]    Special Requests   Final    BOTTLES DRAWN AEROBIC AND ANAEROBIC Blood Culture adequate volume Performed at [facility], [address], [place], [place] [address]    Culture   Final    NO GROWTH 5 DAYS Performed at [facility], [address], [place], [place] [address]    Report Status 01/27/2024 FINAL  Final  MRSA Next Gen by PCR, Nasal     Status: None   Collection Time: 01/24/24  8:12 AM   Specimen: Nasal Mucosa; Nasal Swab  Result Value Ref Range Status   MRSA by PCR Next Gen NOT DETECTED NOT DETECTED Final    Comment: (NOTE) The GeneXpert MRSA Assay (FDA approved for NASAL specimens only), is one component of a comprehensive MRSA colonization surveillance program. It is not intended to diagnose MRSA infection nor to guide or monitor treatment for MRSA infections. Test performance is not FDA approved in patients less than 66 years old. Performed at [facility], [address], [place], [place] [address]       Radiology Studies: DG CHEST PORT 1 VIEW Result Date: 01/29/2024 EXAM: 1 VIEW(S) XRAY OF THE CHEST 01/29/2024 08:01:00 AM COMPARISON: 01/25/2024 CLINICAL HISTORY: Pneumonia FINDINGS: LINES, TUBES AND DEVICES: Left chest cardiac pacing device. LUNGS AND PLEURA: Low lung volumes. Trace bilateral pleural effusions. Mild pulmonary edema with similar to mildly decreased patchy opacities in left mid to lower lung. No pneumothorax. HEART AND MEDIASTINUM: Post median sternotomy and CABG. Atherosclerotic calcifications. No acute abnormality of the cardiac and mediastinal silhouettes. BONES AND SOFT TISSUES: Post median sternotomy. No acute osseous abnormality. IMPRESSION: 1. Mild pulmonary edema with similar to mildly decreased patchy opacities  in the left mid to lower lung, consistent with pneumonia. 2. Trace bilateral pleural effusions. Electronically signed by: Waddell Calk MD 01/29/2024 08:14 AM EST RP  Workstation: [id number]      Scheduled Meds:  allopurinol   100 mg Oral Q0600   apixaban   5 mg Oral BID   atorvastatin   40 mg Oral QPM   docusate sodium   100 mg Oral BID   doxycycline  100 mg Oral Q12H   metoprolol  tartrate  25 mg Oral BID   polyethylene glycol  17 g Oral Daily   senna  1 tablet Oral QHS   sodium chloride  flush  3 mL Intravenous Q12H   Continuous Infusions:  ceFEPime  (MAXIPIME ) IV 2 g (01/29/24 1023)     LOS: 7 days   Time spent: 35 minutes   Delon Hoe, DO [facility] 01/29/2024, 11:57 AM   Available via Epic secure chat 7am-7pm After these hours, please refer to coverage provider listed on [email or web address]

## 2024-01-29 NOTE — Progress Notes (Signed)
 Inpatient Rehab Admissions Coordinator:    CIR following. Pt. Is not yet medically ready for CIR at this time as WBCs are elevated this morning.   Leita Kleine, MS, CCC-SLP Rehab Admissions Coordinator  [phone] (celll) [phone] (office)

## 2024-01-29 NOTE — Progress Notes (Signed)
 Physical Therapy Treatment Patient Details Name: William Hatfield MRN: [id number] DOB: 04-18-1945 Today's Date: 01/29/2024   History of Present Illness 78 y.o. male presenting with fever, nausea, fatigue, and left leg redness and swelling. dopplers negative. CXR findings raise question of bronchopneumonia vs atelectasis.  PMH:  hypertension, CAD status post CABG, atrial fibrillation on Eliquis , CKD 3A, osteoarthritis status post L TKA on 01/16/2024, Parkinson's (per pt)    PT Comments  Pt agreeable to therapy session. He tolerated activity much better on today. Vitals: 106/50, 111 bpm, 100% RA after ambulating. Min A for mobility on today. Seated rest break taken/needed after ~35 feet x 2. Moderate pain level reported during session. Pt is progressing well. Continued education provided with focus on regaining knee extension ROM and proper knee positioning at rest  Patient will benefit from intensive inpatient follow-up therapy, >3 hours/day to improve strength, ROM and regain functional mobility independence prior to returning home.     If plan is discharge home, recommend the following: Assistance with cooking/housework;Assist for transportation;Help with stairs or ramp for entrance;A little help with walking and/or transfers;A little help with bathing/dressing/bathroom   Can travel by private vehicle        Equipment Recommendations  None recommended by PT    Recommendations for Other Services       Precautions / Restrictions Precautions Precautions: Fall;Knee Precaution/Restrictions Comments: Reviewed importance of terminal knee extension, elevation in extension  no pillow behind knee. Restrictions Weight Bearing Restrictions Per Provider Order: No LLE Weight Bearing Per Provider Order: Weight bearing as tolerated     Mobility  Bed Mobility               General bed mobility comments: oob in recliner    Transfers Overall transfer level: Needs assistance Equipment used:  Rolling walker (2 wheels) Transfers: Sit to/from Stand Sit to Stand: Min assist           General transfer comment: Cues for safety, technique, hand/LE placement. Assist needed to power up, stabilize, posiiton L LE, control descent    Ambulation/Gait Ambulation/Gait assistance: Min assist Gait Distance (Feet): 35 Feet (x2) Assistive device: Rolling walker (2 wheels) Gait Pattern/deviations: Step-to pattern       General Gait Details: Cues for safety, technique, sequencing, heel to floor on L. Assist to stabilize pt throughout short distance. Followed closely with recliner. Seated rest breaks taken 2* fatigue, dyspnea, pain.  O2 100% on RA, HR 111 bpm, BP 106/50   Stairs             Wheelchair Mobility     Tilt Bed    Modified Rankin (Stroke Patients Only)       Balance Overall balance assessment: Needs assistance         Standing balance support: Bilateral upper extremity supported, During functional activity, Reliant on assistive device for balance Standing balance-Leahy Scale: Poor                              Communication Communication Communication: No apparent difficulties  Cognition Arousal: Alert Behavior During Therapy: WFL for tasks assessed/performed   PT - Cognitive impairments: No apparent impairments                         Following commands: Intact      Cueing Cueing Techniques: Verbal cues  Exercises Total Joint Exercises Ankle Circles/Pumps: AROM, Both, 15 reps Quad Sets: AROM,  Left, 15 reps Short Arc Quad: AAROM, Left, 15 reps Straight Leg Raises: AAROM, Left, 5 reps (limited by hip pain) Long Arc Quad: AROM, Left, 15 reps, Seated (lacking full extension) Goniometric ROM: ~10-90 degrees    General Comments        Pertinent Vitals/Pain Pain Assessment Pain Assessment: 0-10 Pain Score: 7  Pain Location: L knee with mobility Pain Descriptors / Indicators: Grimacing, Operative site guarding, Aching,  Throbbing Pain Intervention(s): Monitored during session, Ice applied, Repositioned    Home Living                          Prior Function            PT Goals (current goals can now be found in the care plan section) Progress towards PT goals: Progressing toward goals    Frequency    Min 4X/week      PT Plan      Co-evaluation              AM-PAC PT 6 Clicks Mobility   Outcome Measure  Help needed turning from your back to your side while in a flat bed without using bedrails?: A Little Help needed moving from lying on your back to sitting on the side of a flat bed without using bedrails?: A Little Help needed moving to and from a bed to a chair (including a wheelchair)?: A Little Help needed standing up from a chair using your arms (e.g., wheelchair or bedside chair)?: A Little Help needed to walk in hospital room?: A Little Help needed climbing 3-5 steps with a railing? : A Lot 6 Click Score: 17    End of Session Equipment Utilized During Treatment: Gait belt Activity Tolerance: Patient tolerated treatment well;Patient limited by fatigue;Patient limited by pain Patient left: in chair;with call bell/phone within reach   PT Visit Diagnosis: Pain;Difficulty in walking, not elsewhere classified (R26.2);Muscle weakness (generalized) (M62.81) Pain - Right/Left: Left Pain - part of body: Knee     Time: 1137-1212 PT Time Calculation (min) (ACUTE ONLY): 35 min  Charges:    $Gait Training: 8-22 mins $Therapeutic Exercise: 8-22 mins PT General Charges $$ ACUTE PT VISIT: 1 Visit                       Dannial SQUIBB, PT Acute Rehabilitation  Office: [phone]

## 2024-01-29 NOTE — TOC Progression Note (Signed)
 Transition of Care [facility]) - Progression Note   Patient Details  Name: William Hatfield MRN: [id number] Date of Birth: May 29, 1945  [facility] CM/SW Contact  Duwaine GORMAN Aran, LCSW Phone Number: 01/29/2024, 12:20 PM  Clinical Narrative: Patient to be screened for CIR. Care management to follow.  Expected Discharge Plan: IP Rehab Facility Barriers to Discharge: Continued Medical Work up  Expected Discharge Plan and Services In-house Referral: Clinical Social Work Discharge Planning Services: NA Post Acute Care Choice: IP Rehab Living arrangements for the past 2 months: Single Family Home           DME Arranged: N/A DME Agency: NA HH Arranged: PT HH Agency: [facility] Date HH Agency Contacted: 01/25/24 Time HH Agency Contacted: [phone] Representative spoke with at [facility] Agency: [place]  Social Drivers of Health (SDOH) Interventions SDOH Screenings   Food Insecurity: No Food Insecurity (01/22/2024)  Housing: Low Risk  (01/22/2024)  Transportation Needs: No Transportation Needs (01/22/2024)  Utilities: Not At Risk (01/22/2024)  Social Connections: Socially Isolated (01/22/2024)  Tobacco Use: Medium Risk (01/22/2024)   Readmission Risk Interventions    01/25/2024    9:37 AM 10/03/2022    1:13 PM  Readmission Risk Prevention Plan  Post Dischage Appt Complete   Medication Screening Complete   Transportation Screening Complete Complete  Home Care Screening  Complete  Medication Review (RN CM)  Complete

## 2024-01-30 ENCOUNTER — Inpatient Hospital Stay (HOSPITAL_COMMUNITY)
Admission: AD | Admit: 2024-01-30 | Discharge: 2024-02-09 | DRG: 945 | Disposition: A | Source: Other Acute Inpatient Hospital | Attending: Physical Medicine & Rehabilitation | Admitting: Physical Medicine & Rehabilitation

## 2024-01-30 ENCOUNTER — Other Ambulatory Visit: Payer: Self-pay

## 2024-01-30 ENCOUNTER — Encounter (HOSPITAL_COMMUNITY): Payer: Self-pay | Admitting: Physical Medicine & Rehabilitation

## 2024-01-30 DIAGNOSIS — J189 Pneumonia, unspecified organism: Principal | ICD-10-CM | POA: Diagnosis present

## 2024-01-30 DIAGNOSIS — N179 Acute kidney failure, unspecified: Secondary | ICD-10-CM | POA: Diagnosis not present

## 2024-01-30 DIAGNOSIS — Z96652 Presence of left artificial knee joint: Secondary | ICD-10-CM | POA: Diagnosis not present

## 2024-01-30 DIAGNOSIS — E785 Hyperlipidemia, unspecified: Secondary | ICD-10-CM | POA: Diagnosis not present

## 2024-01-30 DIAGNOSIS — N183 Chronic kidney disease, stage 3 unspecified: Secondary | ICD-10-CM | POA: Diagnosis not present

## 2024-01-30 DIAGNOSIS — R5381 Other malaise: Secondary | ICD-10-CM | POA: Diagnosis not present

## 2024-01-30 DIAGNOSIS — N1831 Chronic kidney disease, stage 3a: Secondary | ICD-10-CM | POA: Diagnosis not present

## 2024-01-30 LAB — CBC
HCT: 32.6 % — ABNORMAL LOW (ref 39.0–52.0)
Hemoglobin: 10.5 g/dL — ABNORMAL LOW (ref 13.0–17.0)
MCH: 29.8 pg (ref 26.0–34.0)
MCHC: 32.2 g/dL (ref 30.0–36.0)
MCV: 92.6 fL (ref 80.0–100.0)
Platelets: 432 K/uL — ABNORMAL HIGH (ref 150–400)
RBC: 3.52 MIL/uL — ABNORMAL LOW (ref 4.22–5.81)
RDW: 15.1 % (ref 11.5–15.5)
WBC: 17.6 K/uL — ABNORMAL HIGH (ref 4.0–10.5)
nRBC: 0 % (ref 0.0–0.2)

## 2024-01-30 LAB — BASIC METABOLIC PANEL WITH GFR
Anion gap: 10 (ref 5–15)
BUN: 46 mg/dL — ABNORMAL HIGH (ref 8–23)
CO2: 21 mmol/L — ABNORMAL LOW (ref 22–32)
Calcium: 9.3 mg/dL (ref 8.9–10.3)
Chloride: 106 mmol/L (ref 98–111)
Creatinine, Ser: 1.2 mg/dL (ref 0.61–1.24)
GFR, Estimated: >60 mL/min (ref >60)
Glucose, Bld: 118 mg/dL — ABNORMAL HIGH (ref 70–99)
Potassium: 4.7 mmol/L (ref 3.5–5.1)
Sodium: 136 mmol/L (ref 135–145)

## 2024-01-30 MED ORDER — SULFAMETHOXAZOLE-TRIMETHOPRIM 800-160 MG PO TABS
1.0000 | ORAL_TABLET | Freq: Two times a day (BID) | ORAL | Status: DC
  Administered 2024-01-30: 1 via ORAL
  Filled 2024-01-30: qty 1

## 2024-01-30 MED ORDER — AMOXICILLIN-POT CLAVULANATE 875-125 MG PO TABS: 1.0000 | ORAL_TABLET | Freq: Two times a day (BID) | ORAL | Status: DC

## 2024-01-30 MED ORDER — ATORVASTATIN CALCIUM 40 MG PO TABS
40.0000 mg | ORAL_TABLET | Freq: Every evening | ORAL | Status: DC
  Administered 2024-01-30 – 2024-02-08 (×10): 40 mg via ORAL
  Filled 2024-01-30 (×12): qty 1

## 2024-01-30 MED ORDER — ACETAMINOPHEN 650 MG RE SUPP: 650.0000 mg | Freq: Four times a day (QID) | RECTAL | Status: DC | PRN

## 2024-01-30 MED ORDER — DOXYCYCLINE HYCLATE 100 MG PO TABS: 100.0000 mg | ORAL_TABLET | Freq: Two times a day (BID) | ORAL | Status: DC

## 2024-01-30 MED ORDER — ALUM & MAG HYDROXIDE-SIMETH 200-200-20 MG/5ML PO SUSP: 30.0000 mL | Freq: Four times a day (QID) | ORAL | Status: DC | PRN

## 2024-01-30 MED ORDER — TRIMETHOBENZAMIDE HCL 100 MG/ML IM SOLN
200.0000 mg | Freq: Four times a day (QID) | INTRAMUSCULAR | Status: DC | PRN
  Administered 2024-01-30: 200 mg via INTRAMUSCULAR
  Filled 2024-01-30 (×2): qty 2

## 2024-01-30 MED ORDER — SENNA 8.6 MG PO TABS
1.0000 | ORAL_TABLET | Freq: Every day | ORAL | Status: DC
  Administered 2024-01-30 – 2024-01-31 (×2): 8.6 mg via ORAL
  Filled 2024-01-30 (×2): qty 1

## 2024-01-30 MED ORDER — DOCUSATE SODIUM 100 MG PO CAPS
100.0000 mg | ORAL_CAPSULE | Freq: Two times a day (BID) | ORAL | Status: DC
  Administered 2024-01-30 – 2024-01-31 (×3): 100 mg via ORAL
  Filled 2024-01-30 (×4): qty 1

## 2024-01-30 MED ORDER — PANTOPRAZOLE SODIUM 40 MG PO TBEC: 40.0000 mg | DELAYED_RELEASE_TABLET | Freq: Two times a day (BID) | ORAL | Status: DC

## 2024-01-30 MED ORDER — SULFAMETHOXAZOLE-TRIMETHOPRIM 800-160 MG PO TABS
1.0000 | ORAL_TABLET | Freq: Two times a day (BID) | ORAL | Status: AC
  Administered 2024-01-30 – 2024-02-04 (×11): 1 via ORAL
  Filled 2024-01-30 (×13): qty 1

## 2024-01-30 MED ORDER — OXYCODONE HCL 5 MG PO TABS: 5.0000 mg | ORAL_TABLET | ORAL | Status: DC | PRN

## 2024-01-30 MED ORDER — ONDANSETRON 4 MG PO TBDP: 4.0000 mg | ORAL_TABLET | Freq: Three times a day (TID) | ORAL | 0 refills | Status: DC | PRN

## 2024-01-30 MED ORDER — PANTOPRAZOLE SODIUM 40 MG PO TBEC
40.0000 mg | DELAYED_RELEASE_TABLET | Freq: Two times a day (BID) | ORAL | Status: DC
  Administered 2024-01-30 – 2024-02-09 (×20): 40 mg via ORAL
  Filled 2024-01-30 (×20): qty 1

## 2024-01-30 MED ORDER — POLYETHYLENE GLYCOL 3350 17 G PO PACK
17.0000 g | PACK | Freq: Every day | ORAL | Status: DC
  Administered 2024-01-31 – 2024-02-02 (×2): 17 g via ORAL
  Filled 2024-01-30 (×8): qty 1

## 2024-01-30 MED ORDER — FUROSEMIDE 40 MG PO TABS
40.0000 mg | ORAL_TABLET | Freq: Every day | ORAL | Status: DC
  Administered 2024-01-31 – 2024-02-01 (×2): 40 mg via ORAL
  Filled 2024-01-30 (×2): qty 1

## 2024-01-30 MED ORDER — APIXABAN 5 MG PO TABS
5.0000 mg | ORAL_TABLET | Freq: Two times a day (BID) | ORAL | Status: DC
  Administered 2024-01-30 – 2024-02-09 (×20): 5 mg via ORAL
  Filled 2024-01-30 (×20): qty 1

## 2024-01-30 MED ORDER — SULFAMETHOXAZOLE-TRIMETHOPRIM 800-160 MG PO TABS: 1.0000 | ORAL_TABLET | Freq: Two times a day (BID) | ORAL | 0 refills | Status: DC

## 2024-01-30 MED ORDER — ACETAMINOPHEN 325 MG PO TABS
650.0000 mg | ORAL_TABLET | Freq: Four times a day (QID) | ORAL | Status: DC | PRN
  Administered 2024-01-31 – 2024-02-08 (×5): 650 mg via ORAL
  Filled 2024-01-30 (×5): qty 2

## 2024-01-30 MED ORDER — METOPROLOL TARTRATE 12.5 MG HALF TABLET
25.0000 mg | ORAL_TABLET | Freq: Two times a day (BID) | ORAL | Status: DC
  Administered 2024-01-30 – 2024-02-09 (×18): 25 mg via ORAL
  Filled 2024-01-30 (×20): qty 2

## 2024-01-30 MED ORDER — SODIUM CHLORIDE 0.9 % IV SOLN
2.0000 g | Freq: Two times a day (BID) | INTRAVENOUS | Status: AC
  Administered 2024-01-30: 2 g via INTRAVENOUS
  Filled 2024-01-30: qty 12.5

## 2024-01-30 MED ORDER — PANTOPRAZOLE SODIUM 40 MG PO TBEC
40.0000 mg | DELAYED_RELEASE_TABLET | Freq: Two times a day (BID) | ORAL | Status: DC
  Administered 2024-01-30: 40 mg via ORAL
  Filled 2024-01-30: qty 1

## 2024-01-30 MED ORDER — BISACODYL 10 MG RE SUPP
10.0000 mg | Freq: Every day | RECTAL | Status: DC | PRN
  Administered 2024-01-31: 10 mg via RECTAL
  Filled 2024-01-30: qty 1

## 2024-01-30 MED ORDER — METHOCARBAMOL 500 MG PO TABS: 500.0000 mg | ORAL_TABLET | Freq: Four times a day (QID) | ORAL | Status: DC | PRN

## 2024-01-30 MED ORDER — ALLOPURINOL 100 MG PO TABS
100.0000 mg | ORAL_TABLET | Freq: Every day | ORAL | Status: DC
  Administered 2024-01-31 – 2024-02-09 (×10): 100 mg via ORAL
  Filled 2024-01-30 (×10): qty 1

## 2024-01-30 NOTE — Progress Notes (Addendum)
 Patient was picked up by [facility] to transfer to [facility] CIR. I tried to call report twice. The second time someone answered and put me on hold with no return. I never spoke to anyone. Patient was stable for transfer.  Report given to Darrel RN at 1257.

## 2024-01-30 NOTE — Discharge Instructions (Addendum)
 Inpatient Rehab Discharge Instructions  William Hatfield Discharge date and time: No discharge date for patient encounter.   Activities/Precautions/ Functional Status: Activity: activity as tolerated Diet: regular diet Wound Care: Routine skin checks Functional status:  ___ No restrictions     ___ Walk up steps independently ___ 24/7 supervision/assistance   ___ Walk up steps with assistance ___ Intermittent supervision/assistance  ___ Bathe/dress independently ___ Walk with walker     _x__ Bathe/dress with assistance ___ Walk Independently    ___ Shower independently ___ Walk with assistance    ___ Shower with assistance ___ No alcohol     ___ Return to work/school ________  Special Instructions: No driving smoking or alcohol  COMMUNITY REFERRALS UPON DISCHARGE:    Home Health:   PT     OT                      Agency: [facility]  Phone: [phone] *Please expect follow-up within 2-3 business days for discharge to schedule your home visit. If you have not received follow-up, be sure to contact the site directly.*    My questions have been answered and I understand these instructions. I will adhere to these goals and the provided educational materials after my discharge from the hospital.  Patient/Caregiver Signature _______________________________ Date __________  Clinician Signature _______________________________________ Date __________  Please bring this form and your medication list with you to all your follow-up doctor's appointments.   Information on my medicine - ELIQUIS  (apixaban )  This medication education was reviewed with me or my healthcare representative as part of my discharge preparation.    Why was Eliquis  prescribed for you? Eliquis  was prescribed for you to reduce the risk of a blood clot forming that can cause a stroke if you have a medical condition called atrial fibrillation (a type of irregular heartbeat).  What do You need to know about Eliquis   ? Take your Eliquis  TWICE DAILY - one tablet in the morning and one tablet in the evening with or without food. If you have difficulty swallowing the tablet whole please discuss with your pharmacist how to take the medication safely.  Take Eliquis  exactly as prescribed by your doctor and DO NOT stop taking Eliquis  without talking to the doctor who prescribed the medication.  Stopping may increase your risk of developing a stroke.  Refill your prescription before you run out.  After discharge, you should have regular check-up appointments with your healthcare provider that is prescribing your Eliquis .  In the future your dose may need to be changed if your kidney function or weight changes by a significant amount or as you get older.  What do you do if you miss a dose? If you miss a dose, take it as soon as you remember on the same day and resume taking twice daily.  Do not take more than one dose of ELIQUIS  at the same time to make up a missed dose.  Important Safety Information A possible side effect of Eliquis  is bleeding. You should call your healthcare provider right away if you experience any of the following: Bleeding from an injury or your nose that does not stop. Unusual colored urine (red or dark brown) or unusual colored stools (red or black). Unusual bruising for unknown reasons. A serious fall or if you hit your head (even if there is no bleeding).  Some medicines may interact with Eliquis  and might increase your risk of bleeding or clotting while on Eliquis . To  help avoid this, consult your healthcare provider or pharmacist prior to using any new prescription or non-prescription medications, including herbals, vitamins, non-steroidal anti-inflammatory drugs (NSAIDs) and supplements.  This website has more information on Eliquis  (apixaban ): http://[email or web address] .com/eliquis dena

## 2024-01-30 NOTE — TOC Transition Note (Signed)
 Transition of Care [facility]) - Discharge Note   Patient Details  Name: William Hatfield MRN: [id number] Date of Birth: [birth year]  [facility]) CM/SW Contact:  Alfonse JONELLE Rex, RN Phone Number: 01/30/2024, 10:31 AM   Clinical Narrative:   Per CIR, bed available today, [facility] for transportation. No further INPT CM needs identified at this time.     Final next level of care: IP Rehab Facility Barriers to Discharge: Barriers Resolved   Patient Goals and CMS Choice Patient states their goals for this hospitalization and ongoing recovery are:: To return home CMS Medicare.gov Compare Post Acute Care list provided to:: Patient Choice offered to / list presented to : Patient      Discharge Placement                       Discharge Plan and Services Additional resources added to the After Visit Summary for   In-house Referral: Clinical Social Work Discharge Planning Services: NA Post Acute Care Choice: IP Rehab          DME Arranged: N/A DME Agency: NA       HH Arranged: PT HH Agency: [facility] Date HH Agency Contacted: 01/25/24 Time HH Agency Contacted: [phone] Representative spoke with at [facility] Agency: [place]  Social Drivers of Health (SDOH) Interventions SDOH Screenings   Food Insecurity: No Food Insecurity (01/22/2024)  Housing: Low Risk  (01/22/2024)  Transportation Needs: No Transportation Needs (01/22/2024)  Utilities: Not At Risk (01/22/2024)  Social Connections: Socially Isolated (01/22/2024)  Tobacco Use: Medium Risk (01/22/2024)     Readmission Risk Interventions    01/30/2024   10:30 AM 01/25/2024    9:37 AM 10/03/2022    1:13 PM  Readmission Risk Prevention Plan  Post Dischage Appt Complete Complete   Medication Screening Complete Complete   Transportation Screening Complete Complete Complete  Home Care Screening   Complete  Medication Review (RN CM)   Complete

## 2024-01-30 NOTE — Consult Note (Addendum)
 WOC Nurse Consult Note: Consult requested for buttocks. This was already performed earlier today; refer to consult note for assessment and plan of care, and topical treatment orders have been provided for bedside nurses to perform.  Please re-consult if further assistance is needed.  Thank-you,  Stephane Fought MSN, RN, CWOCN, CWCN-AP, CNS Contact Mon-Fri 0700-1500: [phone]

## 2024-01-30 NOTE — Discharge Summary (Signed)
 Physician Discharge Summary  Patient ID: William Hatfield MRN: [id number] DOB/AGE: January 29, 1946 78 y.o.  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 LUNGS AND PLEURA: Low lung volumes. Trace bilateral pleural effusions. Mild pulmonary edema with similar to mildly decreased patchy opacities in left mid to lower lung. No pneumothorax. HEART AND MEDIASTINUM: Post median sternotomy and CABG. Atherosclerotic calcifications. No acute abnormality of the cardiac and mediastinal silhouettes. BONES AND SOFT TISSUES: Post median sternotomy. No acute osseous abnormality. IMPRESSION: 1. Mild pulmonary edema with similar to mildly decreased patchy opacities in the left mid to lower lung, consistent with pneumonia. 2. Trace bilateral pleural effusions. Electronically signed by: Waddell Calk MD 01/29/2024 08:14 AM EST RP Workstation: [id number]   DG Abd 1 View Result Date: 01/27/2024 CLINICAL DATA:  Nausea and vomiting. EXAM: ABDOMEN - 1 VIEW COMPARISON:  None Available. FINDINGS: Bowel gas pattern is nonobstructive. Mild to moderate fecal retention over the rectum. No free peritoneal air. Degenerative change of the spine and hips. IMPRESSION: Nonobstructive bowel gas pattern with mild to moderate fecal retention over the rectum. Electronically Signed   By: Toribio Agreste M.D.   On: 01/27/2024 12:51   NM Pulmonary  Perfusion Result Date: 01/25/2024 EXAM: NM Lung Perfusion Scan. CLINICAL HISTORY: Pulmonary embolism (PE) suspected, low to intermediate prob, positive D-dimer. TECHNIQUE: Radiolabeled MAA was administered intravenously and planar images of the lungs were obtained in multiple projections. RADIOPHARMACEUTICAL: 4.15 millicurie TECHNETIUM TO 45M ALBUMIN  AGGREGATED. COMPARISON: CT chest radiograph same day. FINDINGS: PERFUSION: No wedge-shaped peripheral perfusion defect within left or right lung to suggest acute pulmonary embolism. Normal perfusion pattern. IMPRESSION: 1. No perfusion defects to indicate pulmonary embolism. Electronically signed by: Norleen Boxer MD 01/25/2024 12:01 PM EST RP Workstation: [id number]   DG CHEST PORT 1 VIEW Result Date: 01/25/2024 CLINICAL DATA:  Elevated D-dimer. EXAM: PORTABLE CHEST 1 VIEW COMPARISON:  01/22/2024 FINDINGS: The heart size and mediastinal contours are within normal limits. Pacemaker again seen. Prior CABG again noted. Low lung volumes are again seen. New opacity is seen in the left perihilar region and mid lung the which may be due to atelectasis or infiltrate. No significant pleural effusion. IMPRESSION: New left perihilar and midlung atelectasis or infiltrate. Electronically Signed   By: Norleen DELENA Kil M.D.   On: 01/25/2024 08:46   ECHOCARDIOGRAM COMPLETE Result Date: 01/24/2024    ECHOCARDIOGRAM REPORT   Patient Name:   William Hatfield Date of Exam: 01/24/2024 Medical Rec #:  [id number]   Height:       71.0 in Accession #:    [id number]  Weight:       186.9 lb Date of Birth:  1945-04-22   BSA:          2.049 m Patient Age:    77 years    BP:           162/84 mmHg Patient Gender: M           HR:           102 bpm. Exam Location:  Inpatient Procedure:  2D Echo, Cardiac Doppler and Color Doppler (Both Spectral and Color            Flow Doppler were utilized during procedure). Indications:    Elevated Troponin  History:        Patient has prior history of Echocardiogram  examinations, most                 recent 01/01/2020. CAD and Previous Myocardial Infarction; Risk                 Factors:Hypertension.  Sonographer:    [name] Gaskins Referring Phys: [id number] ABIGAIL CHAVEZ IMPRESSIONS  1. Left ventricular ejection fraction, by estimation, is 50 to 55%. The left ventricle has low normal function. The left ventricle has no regional wall motion abnormalities. There is severe concentric left ventricular hypertrophy. Left ventricular diastolic parameters are indeterminate.  2. Right ventricular systolic function is normal. The right ventricular size is normal.  3. The mitral valve is normal in structure. Mild to moderate mitral valve regurgitation. No evidence of mitral stenosis.  4. The aortic valve has an indeterminant number of cusps. Aortic valve regurgitation is not visualized. No aortic stenosis is present. FINDINGS  Left Ventricle: Left ventricular ejection fraction, by estimation, is 50 to 55%. The left ventricle has low normal function. The left ventricle has no regional wall motion abnormalities. The left ventricular internal cavity size was normal in size. There is severe concentric left ventricular hypertrophy. Left ventricular diastolic parameters are indeterminate. Right Ventricle: The right ventricular size is normal. No increase in right ventricular wall thickness. Right ventricular systolic function is normal. Left Atrium: Left atrial size was normal in size. Right Atrium: Right atrial size was normal in size. Pericardium: There is no evidence of pericardial effusion. Mitral Valve: The mitral valve is normal in structure. Mild mitral annular calcification. Mild to moderate mitral valve regurgitation. No evidence of mitral valve stenosis. Tricuspid Valve: The tricuspid valve is normal in structure. Tricuspid valve regurgitation is mild . No evidence of tricuspid stenosis. Aortic Valve: The aortic valve has an indeterminant number of cusps. Aortic valve regurgitation is not  visualized. No aortic stenosis is present. Aortic valve mean gradient measures 4.0 mmHg. Aortic valve peak gradient measures 7.1 mmHg. Aortic valve area, by VTI measures 3.51 cm. Pulmonic Valve: The pulmonic valve was not well visualized. Pulmonic valve regurgitation is trivial. No evidence of pulmonic stenosis. Aorta: The aortic root is normal in size and structure. Venous: The inferior vena cava was not well visualized. IAS/Shunts: No atrial level shunt detected by color flow Doppler.  LEFT VENTRICLE PLAX 2D LVIDd:         3.70 cm LVIDs:         2.70 cm LV PW:         1.50 cm LV IVS:        1.70 cm LVOT diam:     2.00 cm LV SV:         78 LV SV Index:   38 LVOT Area:     3.14 cm  RIGHT VENTRICLE RV S prime:     11.00 cm/s TAPSE (M-mode): 1.8 cm LEFT ATRIUM             Index        RIGHT ATRIUM           Index LA Vol (A2C):   65.1 ml 31.77 ml/m  RA Area:     18.40 cm LA Vol (A4C):   44.8 ml  21.86 ml/m  RA Volume:   49.20 ml  24.01 ml/m LA Biplane Vol: 56.2 ml 27.43 ml/m  AORTIC VALVE AV Area (Vmax):    3.09 cm AV Area (Vmean):   3.10 cm AV Area (VTI):     3.51 cm AV Vmax:           133.00 cm/s AV Vmean:          99.800 cm/s AV VTI:            0.223 m AV Peak Grad:      7.1 mmHg AV Mean Grad:      4.0 mmHg LVOT Vmax:         131.00 cm/s LVOT Vmean:        98.500 cm/s LVOT VTI:          0.249 m LVOT/AV VTI ratio: 1.12  AORTA Ao Root diam: 3.10 cm MITRAL VALVE MV Area (PHT): 3.24 cm     SHUNTS MV E velocity: 140.00 cm/s  Systemic VTI:  0.25 m                             Systemic Diam: 2.00 cm Kardie Tobb DO Electronically signed by Dub Huntsman DO Signature Date/Time: 01/24/2024/9:35:31 AM    Final    CT CHEST WO CONTRAST Result Date: 01/23/2024 EXAM: CT CHEST WITHOUT CONTRAST 01/23/2024 09:12:00 PM TECHNIQUE: CT of the chest was performed without the administration of intravenous contrast. Multiplanar reformatted images are provided for review. Automated exposure control, iterative reconstruction,  and/or weight based adjustment of the mA/kV was utilized to reduce the radiation dose to as low as reasonably achievable. COMPARISON: Comparison with chest radiograph 01/22/2024. CLINICAL HISTORY: Pneumonia, complication suspected, xray done. FINDINGS: MEDIASTINUM: Cardiac pacemaker. Postoperative changes in the mediastinum consistent with coronary bypass. Calcification in the aorta and coronary arteries. The central airways are clear. LYMPH NODES: No mediastinal, hilar or axillary lymphadenopathy. LUNGS AND PLEURA: Emphysematous changes in the lungs. Small bilateral pleural effusions with basilar atelectasis or consolidation, greater on the left. Patchy infiltrates are demonstrated focally in the upper lungs and left lingula, likely multifocal pneumonia. No pneumothorax. SOFT TISSUES/BONES: Degenerative changes in the spine. Sternotomy wires. UPPER ABDOMEN: Mild infiltration and stranding around the tail of the pancreas may indicate early changes of acute pancreatitis. No loculated collection. IMPRESSION: 1. Findings consistent with multifocal pneumonia, with patchy infiltrates in the upper lungs and left lingula, and basilar atelectasis or consolidation greater on the left. 2. Small bilateral pleural effusions. 3. Emphysematous changes in the lungs. 4. Mild infiltration and stranding around the tail of the pancreas, possibly indicating early changes of acute pancreatitis. No loculated collection. Electronically signed by: Elsie Gravely MD 01/23/2024 09:26 PM EST RP Workstation: [id number]   VAS US  LOWER EXTREMITY VENOUS (DVT) Result Date: 01/23/2024  Lower Venous DVT Study Patient Name:  William Hatfield  Date of Exam:   01/23/2024 Medical Rec #: [id number]    Accession #:    [id number] Date of Birth: 1945-05-01    Patient Gender: M Patient Age:   89 years Exam Location:  [facility] Procedure:      VAS US  LOWER EXTREMITY VENOUS (DVT) Referring Phys: JEFFREY BEANE  --------------------------------------------------------------------------------  Indications: Pain, Swelling, and Erythema. Other Indications: Cellulitis. Risk Factors: Surgery Left TKA 01/16/2024. Anticoagulation: Eliquis . Limitations: Poor ultrasound/tissue interface. Comparison Study: No previous exams Performing Technologist: Jody Hill RVT, RDMS  Examination Guidelines: A complete evaluation includes B-mode imaging, spectral  Doppler, color Doppler, and power Doppler as needed of all accessible portions of each vessel. Bilateral testing is considered an integral part of a complete examination. Limited examinations for reoccurring indications may be performed as noted. The reflux portion of the exam is performed with the patient in reverse Trendelenburg.  +-----+---------------+---------+-----------+----------+--------------+ RIGHTCompressibilityPhasicitySpontaneityPropertiesThrombus Aging +-----+---------------+---------+-----------+----------+--------------+ CFV  Full           No       Yes        pulsatile                +-----+---------------+---------+-----------+----------+--------------+   +---------+---------------+---------+-----------+----------+--------------+ LEFT     CompressibilityPhasicitySpontaneityPropertiesThrombus Aging +---------+---------------+---------+-----------+----------+--------------+ CFV      Full           No       Yes        pulsatile                +---------+---------------+---------+-----------+----------+--------------+ SFJ      Full                                                        +---------+---------------+---------+-----------+----------+--------------+ FV Prox  Full           Yes      Yes                                 +---------+---------------+---------+-----------+----------+--------------+ FV Mid   Full           Yes      Yes                                  +---------+---------------+---------+-----------+----------+--------------+ FV DistalFull           Yes      Yes                                 +---------+---------------+---------+-----------+----------+--------------+ PFV      Full                                                        +---------+---------------+---------+-----------+----------+--------------+ POP      Full           Yes      Yes                                 +---------+---------------+---------+-----------+----------+--------------+ PTV      Full                                                        +---------+---------------+---------+-----------+----------+--------------+ PERO     Full                                                        +---------+---------------+---------+-----------+----------+--------------+  Summary: RIGHT: - No evidence of common femoral vein obstruction.   LEFT: - There is no evidence of deep vein thrombosis in the lower extremity.  - No cystic structure found in the popliteal fossa. Subcutaneous edema seen in area of calf and ankle.  *See table(s) above for measurements and observations. Electronically signed by Lonni Gaskins MD on 01/23/2024 at 12:08:52 PM.    Final    DG Tibia/Fibula Left Result Date: 01/22/2024 CLINICAL DATA:  poss post-op infx assessment EXAM: LEFT TIBIA AND FIBULA - 2 VIEW COMPARISON:  None Available. FINDINGS: Subcutaneous surgical clip along the mid to distal tibia medially.No acute fracture or dislocation. No radiographic findings suggestive of osteomyelitis. Soft tissues are unremarkable. IMPRESSION: No acute fracture or malalignment of the tibia and fibula. Electronically Signed   By: Rogelia Myers M.D.   On: 01/22/2024 16:26   DG Knee Complete 4 Views Left Result Date: 01/22/2024 CLINICAL DATA:  poss post-op infx assessment EXAM: LEFT KNEE - COMPLETE 4+ VIEW COMPARISON:  01/16/2024 FINDINGS: Well-aligned knee arthroplasty without acute  fracture.No periprosthetic lucency to suggest loosening.No dislocation. Decreasing, but persistent subcutaneous gas with gas in the joint space. Moderate soft tissue swelling about the knee. Surgical skin staples. IMPRESSION: 1. Moderate soft tissue swelling about the knee. Decreasing, but persistent subcutaneous gas with gas noted in the joint space. These may be expected findings at this stage in the postoperative recovery. Correlation with physical exam findings recommended. 2. Well-aligned knee arthroplasty without acute fracture or dislocation. Electronically Signed   By: Rogelia Myers M.D.   On: 01/22/2024 16:25   DG Chest 2 View Result Date: 01/22/2024 CLINICAL DATA:  weakness EXAM: CHEST - 2 VIEW COMPARISON:  February 20, 2020 FINDINGS: Low lung volumes with elevation of the right hemidiaphragm. Hazy opacities in the left perihilar mid lung. No pneumothorax or pleural effusion. Sternotomy wires and CABG markers. Mild cardiomegaly. Left chest pacemaker with leads terminating in the right atrium and right ventricle. Tortuous aorta with aortic atherosclerosis. No acute fracture or destructive lesions. Multilevel thoracic osteophytosis. IMPRESSION: Hazy opacities in the left perihilar mid lung, which may represent atelectasis or a developing bronchopneumonia, in the correct clinical context no pleural effusion. Electronically Signed   By: Rogelia Myers M.D.   On: 01/22/2024 16:22   DG Knee 1-2 Views Left Result Date: 01/16/2024 CLINICAL DATA:  Post total knee replacement. EXAM: LEFT KNEE - 1-2 VIEW COMPARISON:  09/22/2019 FINDINGS: Evidence of patient's recent left total knee arthroplasty with prosthetic components intact and normally located. Skin staples over the anterior soft tissues. Fluid within the knee joint compatible recent surgery. Remainder the exam is unremarkable. IMPRESSION: Expected changes post recent left total knee arthroplasty. Electronically Signed   By: Toribio Agreste M.D.   On:  01/16/2024 11:42    Labs:  Basic Metabolic Panel: Recent Labs  Lab 02/02/24 0610 02/03/24 0714 02/04/24 0621 02/07/24 0554  NA 134* 135 135 133*  K 4.5 4.2 4.7 4.1  CL 102 107 105 102  CO2 23 22 20* 22  GLUCOSE 102* 110* 100* 99  BUN 37* 31* 30* 34*  CREATININE 1.71* 1.61* 1.59* 1.34*  CALCIUM  8.8* 8.7* 8.9 8.9    CBC: Recent Labs  Lab 02/04/24 0621 02/07/24 0554  WBC 12.5* 10.5  HGB 10.6* 10.5*  HCT 32.5* 32.8*  MCV 90.8 93.7  PLT 368 344    CBG: No results for input(s): GLUCAP in the last 168 hours.  Brief HPI:   William Hatfield is a 78  y.o. right-handed male with history significant for hypertension, complete heart block with pacemaker 2021, CAD with CABG at [facility] 2006, atrial fibrillation maintained on Eliquis , CKD stage III, nonessential tremor, back surgery x 2 with fusion, left carotid enterectomy 05/02/2022 per Dr. Fonda Simpers with TCAR of the right ICA 8/24 as well as recent left total knee arthroplasty 01/16/2024 per Dr. Reyes Billing.  Per chart review patient lives alone 1 level home.  Ambulates without assistive device and still drives.  Patient has been using a rolling walker since recent knee surgery.  His daughter plans to assist on discharge.  Presented to [facility]/22/2024 with fever nausea fatigue and left side redness of the leg with swelling over 48 hours.  In the ED patient afebrile saturating well on room air most notable creatinine 1.71 WBC 13,200-21,000, ALT 63 alkaline phosphatase 206 normal lactic acid and hemoglobin 10.9, urinalysis negative troponin 348-350, D-dimer 1.49.  Chest x-ray opacity at the left midlung possibly reflecting atelectasis and/or developing bronchial pneumonia.  Plain radiographs of the left leg revealed soft tissue swelling and well aligned knee arthroplasty without acute fracture or dislocation.  Blood cultures no growth to date.  Patient initially placed on empiric antibiotic therapy maintain on 7-day  course of Maxipime  for suspect left lower extremity cellulitis/HCAP however increasing leukocytosis to 21,100 and BNP 16,192 with the addition of doxycycline  which was changed to Bactrim  due to bouts of nausea related to doxycycline .  Venous Doppler studies negative for DVT.  CT of the chest findings consistent with multifocal pneumonia with patchy infiltrates in the upper lungs and left lingula and basilar atelectasis and/or consolidation greater on the left.  Small bilateral pleural effusions and follow-up chest x-ray mild pulmonary edema similar to mild decreased patchy opacities in the left mid to lower lung consistent with pneumonia, urinalysis negative blood cultures continue to show no growth.  Nuclear medicine pulmonary perfusion scan showed no pulmonary emboli.  Patient remained on chronic Eliquis  therapy.  Cardiology service follow-up for elevated troponin felt to be related to demand ischemia versus related to pneumonia.  Echocardiogram showed ejection fraction of 50 to 55% no wall motion abnormalities.  Patient did have some initial persistent dyspnea with minimal exertion possibly related to pneumonia and/or deconditioning with infrequent PVCs on beta-blocker did receive Lasix  therapy.  Blood pressure remains soft in the 90s with ambulation.  Renal function remained stable latest creatinine 1.12 with Jardiance  and losartan  on hold.  Wound care nurse consulted for pressure injury to the buttocks with skin care as directed.  Therapy evaluations completed due to patient's decreased functional mobility was admitted for a comprehensive rehab program.      Hospital Course: William Hatfield was admitted to rehab 01/30/2024 for inpatient therapies to consist of PT, ST and OT at least three hours five days a week. Past admission physiatrist, therapy team and rehab RN have worked together to provide customized collaborative inpatient rehab.  Pertaining to patient's debility related to HCAP question cellulitis  after recent left total knee arthroplasty 03/18/2023.  Plan was to remove staples from left knee 02/01/2024 apply Steri-Strips for orthopedic services.  Patient weightbearing as tolerated.  He was completing a course of Bactrim  and cefepime  for HCAP and monitoring of oxygen saturations with every shift.  History of CAD with CABG/PPM/PAF cardiac rate controlled followed by cardiology services elevated troponin felt to be related to demand ischemia and Imdur  currently on hold due to soft blood pressure and Jardiance  had been resumed.  Patient remained on chronic Eliquis  therapy as well as beta-blocker for PAF.  Pain managed with use of Robaxin  as well as oxycodone  as needed.  Acute renal failure superimposed on CKD stage III follow-up chemistries were monitored and latest creatinine 1.34 with losartan /Jardiance  on hold as well as Lasix  had initially been held and Lasix  resumed at 40 mg daily and would address resuming Jardiance  and losartan  as an outpatient.  Lipitor ongoing for hyperlipidemia.  Pressure injury to the buttocks wound care nurse to follow-up with skin care recommendations as directed.   Blood pressures were monitored on TID basis and remained soft and monitored     Media Information   Document Information  Photos  Right buttocks  01/30/2024 14:25  Attached To:  Hospital Encounter on 01/30/24  Source Information  Sula Eulalio MATSU, RN  [facility]   Rehab course: During patient's stay in rehab weekly team conferences were held to monitor patient's progress, set goals and discuss barriers to discharge. At admission, patient required minimal assist 35 feet rolling walker minimal assist stand pivot transfers minimal assist step pivot transfers  He/She  has had improvement in activity tolerance, balance, postural control as well as ability to compensate for deficits. He/She has had improvement in functional use RUE/LUE  and RLE/LLE as well as improvement in awareness.  Working  with energy conservation techniques.  Ambulates 155 feet using rolling walker standby assist.  Navigates four 6 inch hurdles x 3 trials using rolling walker leading with right lower extremity for improved stance/time and tolerance.  Patient ascends and descends four 6 inch steps using bilateral handrails and contact-guard.  Patient completed functional mobility to the bathroom supervision.  Completed clothing doffing with supervision and seated and standing.  Demonstrated good safety awareness with dressing and doffing.  Completed showering with supervision and set up.  Patient able to wash all body parts with supervision.  Completed dressing upper body modified independent lower body supervision.  Completed dressing set at on bedside commode and bathroom edge of bed.  Full family teaching completed plan discharge to home       Disposition:  Discharge disposition: 06-Home-Health Care Svc        Diet: Regular  Special Instructions: No driving smoking or alcohol  Weightbearing as tolerated left lower extremity  Medications at discharge. 1.  Tylenol  as needed 2.  Zyloprim  100 mg p.o. daily 3.  Eliquis  5 mg p.o. twice daily 4.  Lipitor 40 mg p.o. daily 5.  Colace 100 mg p.o. twice daily 6.  Lasix  40 mg p.o. daily 7.  Robaxin  500 mg every 6 hours as needed muscle spasms 8.  Lopressor  25 mg p.o. twice daily 9.  Oxycodone  5 mg every 4 hours as needed pain 10.  Protonix  40 mg p.o. twice daily 11.  MiraLAX  daily hold for loose stools 12.  Senokot 1 tablet  nightly 13.  Gerhardt Butt cream twice daily 14.  Melatonin 3 mg nightly 15.  Nitroglycerin  as needed    30-35 minutes were spent completing discharge summary and discharge planning     Follow-up Information     Urbano Albright, MD Follow up.   Specialty: Physical Medicine and Rehabilitation Why: No formal follow-up needed Contact information: [address] [place] [address] [phone]          Henriette Anes, DO Follow up.   Specialty: Family Medicine Why: Call for appointment Contact information: [address] [place] [address] [phone]  Duwayne Purchase, MD Follow up.   Specialty: Orthopedic Surgery Why: Call for appointment Contact information: [address] [place] [address] [phone]         Debera [name] MATSU, MD Follow up.   Specialty: Cardiology Why: Call for appointment Contact information: [address] [place] [address] [phone]                 Signed: Toribio PARAS Tersea Aulds 02/08/2024, 7:06 AM

## 2024-01-30 NOTE — Progress Notes (Signed)
 Inpatient Rehab Admissions Coordinator:    I have a CIR bed with Pt. RN may call report to [phone]  Pt. Will admit to CIR for an estimated 10-12 days with the goal of reaching supervision goals and returning home with his daughter [name].   Leita Kleine, MS, CCC-SLP Rehab Admissions Coordinator  [phone] (celll) [phone] (office)

## 2024-01-30 NOTE — Progress Notes (Signed)
 Patient expressed concern that his scheduled doxycycline pill is upsetting his stomach, causing nausea and vomiting, every time he takes it.

## 2024-01-30 NOTE — Progress Notes (Signed)
 Mobility Specialist - Progress Note   01/30/24 0900  Mobility  Activity Ambulated with assistance  Level of Assistance Contact guard assist, steadying assist  Assistive Device Front wheel walker  Distance Ambulated (ft) 35 ft  Range of Motion/Exercises Active  LLE Weight Bearing Per Provider Order WBAT  Activity Response Tolerated well  Mobility Referral Yes  Mobility visit 1 Mobility  Mobility Specialist Start Time (ACUTE ONLY) 0847  Mobility Specialist Stop Time (ACUTE ONLY) 0900  Mobility Specialist Time Calculation (min) (ACUTE ONLY) 13 min   Received in bed with knees bent in bed resting. Advised to refrain from that. Agreed to mobility, some c/o dizziness upon ambulation. Returned to chair with all needs met, elevated feet with pillow under feet to insure extension.   Cyndee Ada Mobility Specialist

## 2024-01-30 NOTE — Discharge Summary (Signed)
 Physician Discharge Summary  William Hatfield FMW:[id number] DOB: 1945-09-08 DOA: 01/22/2024  PCP: Henriette Anes, DO  Admit date: 01/22/2024 Discharge date: 01/30/2024  Admitted From: Home Disposition: Inpatient rehab   Recommendations for Outpatient Follow-up:  Follow up with PCP in 1-2 weeks resume and titrate antihypertensive medication as tolerated. Please obtain BMP/CBC in one week  Home Health:No Equipment/Devices:None  Discharge Condition:Stable CODE STATUS:Full Diet recommendation: Heart Healthy  Brief/Interim Summary: 78 y.o. male past medical history of essential hypertension, CAD status post CABG, paroxysmal atrial fibrillation on Eliquis , chronic kidney disease stage III AA status post total left knee arthroplasty on 01/16/2024, now presenting with fever nausea fatigue left leg redness and swelling, admitted for suspected left lower extremity cellulitis started on broad-spectrum antibiotics, ultimately was diagnosed with pneumonia VQ scan was negative for PE due to complaints of chest pain cardiology was consulted in the setting of CAD.   Discharge Diagnoses:  Principal Problem:   HCAP (healthcare-associated pneumonia) Active Problems:   Hypertension   CAD (coronary artery disease)   Hx of CABG   Dyslipidemia   PAF (paroxysmal atrial fibrillation) (HCC)   Left leg cellulitis   Acute renal failure superimposed on stage 3a chronic kidney disease (HCC)  Healthcare associated pneumonia Started empirically on cefepime  he completed 7-day course in house. Cultures remain negative till date.  Leukocytosis possibly due to infected sacral decubitus gluteal stage III, present on admission, He was started on IV cefepime  de-escalated to doxycycline and Augmentin but he started getting nauseated with doxycycline. He remained febrile and leukocytosis. Transition to oral Bactrim which will continue as an outpatient for 7 days.  Left lower extremity cellulitis has been ruled out:   Exam lower extremity Doppler was negative for DVT. Orthopedic surgery was consulted they felt that the erythema was likely not from infection or postop changes. He will physical therapy will order inpatient rehab.  Elevated D-dimer: VQ scan negative.  Chest pain with a history of CABG/HFpEF/complete heart block with pacemaker placement in 2021: Prior to admission he was on Lipitor, ARB, Imdur  and metoprolol .  ARB and Imdur  were held he was continue metoprolol . 2D echo showed preserved EF Imdur  was held he was orthostatics. Cardiology restarted back on his Lasix . There were low suspicion for ischemic chest pain elevation in troponin likely due to demand ischemia in the setting of pneumonia. He resume his home dose of Lasix .  Paroxysmal atrial fibrillation: No change made to his medication continue metoprolol  and Eliquis .  History of complete heart block status post pacemaker placement 2021: Noted.  Acute kidney injury on chronic kidney disease stage IIIa: Losartan  and Jardiance  were held. His creatinine returned to baseline with IV fluid resuscitation. As his blood pressure rises he could be restarted on losartan  as an outpatient.  Constipation continue Continue bowel regimen    Discharge Instructions  Discharge Instructions     Diet - low sodium heart healthy   Complete by: As directed    Discharge wound care:   Complete by: As directed    Per wound care instructions   Increase activity slowly   Complete by: As directed       Allergies as of 01/30/2024       Reactions   Niaspan [niacin] Hives, Itching, Other (See Comments)   NIASPAN ONLY        Medication List     PAUSE taking these medications    isosorbide  mononitrate 30 MG 24 hr tablet Wait to take this until your doctor or other care provider  tells you to start again. Commonly known as: IMDUR  Take 1 tablet (30 mg total) by mouth daily.   losartan  50 MG tablet Wait to take this until your doctor  or other care provider tells you to start again. Commonly known as: COZAAR  TAKE 1 TABLET EVERY DAY (DOSE INCREASED) What changed: See the new instructions.       STOP taking these medications    CoQ-10 100 MG Caps   cyanocobalamin  500 MCG tablet Commonly known as: VITAMIN B12   multivitamin tablet       TAKE these medications    acetaminophen  500 MG tablet Commonly known as: TYLENOL  Take 500-1,000 mg by mouth every 6 (six) hours as needed for fever or mild pain (pain score 1-3) (headaches, or discomfort).   allopurinol  100 MG tablet Commonly known as: ZYLOPRIM  Take 100 mg by mouth in the morning.   apixaban  5 MG Tabs tablet Commonly known as: Eliquis  Take 1 tablet (5 mg total) by mouth 2 (two) times daily.   atorvastatin  40 MG tablet Commonly known as: LIPITOR TAKE 1 TABLET EVERY EVENING What changed: when to take this   docusate sodium  100 MG capsule Commonly known as: COLACE Take 1 capsule (100 mg total) by mouth 2 (two) times daily. What changed: when to take this   furosemide  40 MG tablet Commonly known as: LASIX  TAKE 1 TABLET EVERY DAY What changed: when to take this   Jardiance  10 MG Tabs tablet Generic drug: empagliflozin  Take 10 mg by mouth in the morning.   methocarbamol  500 MG tablet Commonly known as: ROBAXIN  Take 1 tablet (500 mg total) by mouth every 6 (six) hours as needed for muscle spasms. What changed: when to take this   metoprolol  tartrate 25 MG tablet Commonly known as: LOPRESSOR  TAKE 1 TABLET TWICE DAILY   nitroGLYCERIN  0.4 MG SL tablet Commonly known as: NITROSTAT  DISSOLVE 1 TABLET UNDER THE TONGUE EVERY 5 MINUTES FOR 3 DOSES AS NEEDED FOR CHEST PAIN AS DIRECTED. What changed: See the new instructions.   ondansetron  4 MG disintegrating tablet Commonly known as: ZOFRAN -ODT Take 1 tablet (4 mg total) by mouth every 8 (eight) hours as needed for nausea or vomiting.   oxyCODONE  5 MG immediate release tablet Commonly known as:  Oxy IR/ROXICODONE  Take 1-2 tablets (5-10 mg total) by mouth every 4 (four) hours as needed for moderate pain (pain score 4-6) (pain score 4-6). What changed:  when to take this additional instructions   pantoprazole  40 MG tablet Commonly known as: PROTONIX  Take 1 tablet (40 mg total) by mouth 2 (two) times daily.   polyethylene glycol 17 g packet Commonly known as: MIRALAX  / GLYCOLAX  Take 17 g by mouth daily.   sulfamethoxazole -trimethoprim  800-160 MG tablet Commonly known as: BACTRIM  DS Take 1 tablet by mouth every 12 (twelve) hours for 7 days.               Discharge Care Instructions  (From admission, onward)           Start     Ordered   01/30/24 0000  Discharge wound care:       Comments: Per wound care instructions   01/30/24 1039            Allergies  Allergen Reactions   Niaspan [Niacin] Hives, Itching and Other (See Comments)    NIASPAN ONLY    Consultations: Cardiology Orthopedic surgery   Procedures/Studies: DG CHEST PORT 1 VIEW Result Date: 01/29/2024 EXAM: 1 VIEW(S) XRAY OF THE  CHEST 01/29/2024 08:01:00 AM COMPARISON: 01/25/2024 CLINICAL HISTORY: Pneumonia FINDINGS: LINES, TUBES AND DEVICES: Left chest cardiac pacing device. LUNGS AND PLEURA: Low lung volumes. Trace bilateral pleural effusions. Mild pulmonary edema with similar to mildly decreased patchy opacities in left mid to lower lung. No pneumothorax. HEART AND MEDIASTINUM: Post median sternotomy and CABG. Atherosclerotic calcifications. No acute abnormality of the cardiac and mediastinal silhouettes. BONES AND SOFT TISSUES: Post median sternotomy. No acute osseous abnormality. IMPRESSION: 1. Mild pulmonary edema with similar to mildly decreased patchy opacities in the left mid to lower lung, consistent with pneumonia. 2. Trace bilateral pleural effusions. Electronically signed by: Waddell Calk MD 01/29/2024 08:14 AM EST RP Workstation: [id number]   DG Abd 1 View Result Date:  01/27/2024 CLINICAL DATA:  Nausea and vomiting. EXAM: ABDOMEN - 1 VIEW COMPARISON:  None Available. FINDINGS: Bowel gas pattern is nonobstructive. Mild to moderate fecal retention over the rectum. No free peritoneal air. Degenerative change of the spine and hips. IMPRESSION: Nonobstructive bowel gas pattern with mild to moderate fecal retention over the rectum. Electronically Signed   By: Toribio Agreste M.D.   On: 01/27/2024 12:51   NM Pulmonary Perfusion Result Date: 01/25/2024 EXAM: NM Lung Perfusion Scan. CLINICAL HISTORY: Pulmonary embolism (PE) suspected, low to intermediate prob, positive D-dimer. TECHNIQUE: Radiolabeled MAA was administered intravenously and planar images of the lungs were obtained in multiple projections. RADIOPHARMACEUTICAL: 4.15 millicurie TECHNETIUM TO 22M ALBUMIN AGGREGATED. COMPARISON: CT chest radiograph same day. FINDINGS: PERFUSION: No wedge-shaped peripheral perfusion defect within left or right lung to suggest acute pulmonary embolism. Normal perfusion pattern. IMPRESSION: 1. No perfusion defects to indicate pulmonary embolism. Electronically signed by: Norleen Boxer MD 01/25/2024 12:01 PM EST RP Workstation: [id number]   DG CHEST PORT 1 VIEW Result Date: 01/25/2024 CLINICAL DATA:  Elevated D-dimer. EXAM: PORTABLE CHEST 1 VIEW COMPARISON:  01/22/2024 FINDINGS: The heart size and mediastinal contours are within normal limits. Pacemaker again seen. Prior CABG again noted. Low lung volumes are again seen. New opacity is seen in the left perihilar region and mid lung the which may be due to atelectasis or infiltrate. No significant pleural effusion. IMPRESSION: New left perihilar and midlung atelectasis or infiltrate. Electronically Signed   By: Norleen DELENA Kil M.D.   On: 01/25/2024 08:46   ECHOCARDIOGRAM COMPLETE Result Date: 01/24/2024    ECHOCARDIOGRAM REPORT   Patient Name:   William Hatfield Date of Exam: 01/24/2024 Medical Rec #:  [id number]   Height:       71.0 in Accession #:     [id number]  Weight:       186.9 lb Date of Birth:  [birth year]   BSA:          2.049 m Patient Age:    77 years    BP:           162/84 mmHg Patient Gender: M           HR:           102 bpm. Exam Location:  Inpatient Procedure: 2D Echo, Cardiac Doppler and Color Doppler (Both Spectral and Color            Flow Doppler were utilized during procedure). Indications:    Elevated Troponin  History:        Patient has prior history of Echocardiogram examinations, most                 recent 01/01/2020. CAD and Previous Myocardial Infarction; Risk  Factors:Hypertension.  Sonographer:    [name] Gaskins Referring Phys: [id number] ABIGAIL CHAVEZ IMPRESSIONS  1. Left ventricular ejection fraction, by estimation, is 50 to 55%. The left ventricle has low normal function. The left ventricle has no regional wall motion abnormalities. There is severe concentric left ventricular hypertrophy. Left ventricular diastolic parameters are indeterminate.  2. Right ventricular systolic function is normal. The right ventricular size is normal.  3. The mitral valve is normal in structure. Mild to moderate mitral valve regurgitation. No evidence of mitral stenosis.  4. The aortic valve has an indeterminant number of cusps. Aortic valve regurgitation is not visualized. No aortic stenosis is present. FINDINGS  Left Ventricle: Left ventricular ejection fraction, by estimation, is 50 to 55%. The left ventricle has low normal function. The left ventricle has no regional wall motion abnormalities. The left ventricular internal cavity size was normal in size. There is severe concentric left ventricular hypertrophy. Left ventricular diastolic parameters are indeterminate. Right Ventricle: The right ventricular size is normal. No increase in right ventricular wall thickness. Right ventricular systolic function is normal. Left Atrium: Left atrial size was normal in size. Right Atrium: Right atrial size was normal in size. Pericardium: There  is no evidence of pericardial effusion. Mitral Valve: The mitral valve is normal in structure. Mild mitral annular calcification. Mild to moderate mitral valve regurgitation. No evidence of mitral valve stenosis. Tricuspid Valve: The tricuspid valve is normal in structure. Tricuspid valve regurgitation is mild . No evidence of tricuspid stenosis. Aortic Valve: The aortic valve has an indeterminant number of cusps. Aortic valve regurgitation is not visualized. No aortic stenosis is present. Aortic valve mean gradient measures 4.0 mmHg. Aortic valve peak gradient measures 7.1 mmHg. Aortic valve area, by VTI measures 3.51 cm. Pulmonic Valve: The pulmonic valve was not well visualized. Pulmonic valve regurgitation is trivial. No evidence of pulmonic stenosis. Aorta: The aortic root is normal in size and structure. Venous: The inferior vena cava was not well visualized. IAS/Shunts: No atrial level shunt detected by color flow Doppler.  LEFT VENTRICLE PLAX 2D LVIDd:         3.70 cm LVIDs:         2.70 cm LV PW:         1.50 cm LV IVS:        1.70 cm LVOT diam:     2.00 cm LV SV:         78 LV SV Index:   38 LVOT Area:     3.14 cm  RIGHT VENTRICLE RV S prime:     11.00 cm/s TAPSE (M-mode): 1.8 cm LEFT ATRIUM             Index        RIGHT ATRIUM           Index LA Vol (A2C):   65.1 ml 31.77 ml/m  RA Area:     18.40 cm LA Vol (A4C):   44.8 ml 21.86 ml/m  RA Volume:   49.20 ml  24.01 ml/m LA Biplane Vol: 56.2 ml 27.43 ml/m  AORTIC VALVE AV Area (Vmax):    3.09 cm AV Area (Vmean):   3.10 cm AV Area (VTI):     3.51 cm AV Vmax:           133.00 cm/s AV Vmean:          99.800 cm/s AV VTI:            0.223 m AV Peak Grad:  7.1 mmHg AV Mean Grad:      4.0 mmHg LVOT Vmax:         131.00 cm/s LVOT Vmean:        98.500 cm/s LVOT VTI:          0.249 m LVOT/AV VTI ratio: 1.12  AORTA Ao Root diam: 3.10 cm MITRAL VALVE MV Area (PHT): 3.24 cm     SHUNTS MV E velocity: 140.00 cm/s  Systemic VTI:  0.25 m                              Systemic Diam: 2.00 cm Kardie Tobb DO Electronically signed by Dub Huntsman DO Signature Date/Time: 01/24/2024/9:35:31 AM    Final    CT CHEST WO CONTRAST Result Date: 01/23/2024 EXAM: CT CHEST WITHOUT CONTRAST 01/23/2024 09:12:00 PM TECHNIQUE: CT of the chest was performed without the administration of intravenous contrast. Multiplanar reformatted images are provided for review. Automated exposure control, iterative reconstruction, and/or weight based adjustment of the mA/kV was utilized to reduce the radiation dose to as low as reasonably achievable. COMPARISON: Comparison with chest radiograph 01/22/2024. CLINICAL HISTORY: Pneumonia, complication suspected, xray done. FINDINGS: MEDIASTINUM: Cardiac pacemaker. Postoperative changes in the mediastinum consistent with coronary bypass. Calcification in the aorta and coronary arteries. The central airways are clear. LYMPH NODES: No mediastinal, hilar or axillary lymphadenopathy. LUNGS AND PLEURA: Emphysematous changes in the lungs. Small bilateral pleural effusions with basilar atelectasis or consolidation, greater on the left. Patchy infiltrates are demonstrated focally in the upper lungs and left lingula, likely multifocal pneumonia. No pneumothorax. SOFT TISSUES/BONES: Degenerative changes in the spine. Sternotomy wires. UPPER ABDOMEN: Mild infiltration and stranding around the tail of the pancreas may indicate early changes of acute pancreatitis. No loculated collection. IMPRESSION: 1. Findings consistent with multifocal pneumonia, with patchy infiltrates in the upper lungs and left lingula, and basilar atelectasis or consolidation greater on the left. 2. Small bilateral pleural effusions. 3. Emphysematous changes in the lungs. 4. Mild infiltration and stranding around the tail of the pancreas, possibly indicating early changes of acute pancreatitis. No loculated collection. Electronically signed by: Elsie Gravely MD 01/23/2024 09:26 PM EST RP  Workstation: [id number]   VAS US  LOWER EXTREMITY VENOUS (DVT) Result Date: 01/23/2024  Lower Venous DVT Study Patient Name:  William Hatfield  Date of Exam:   01/23/2024 Medical Rec #: [id number]    Accession #:    [id number] Date of Birth: 1945/08/10    Patient Gender: M Patient Age:   10 years Exam Location:  [facility] Procedure:      VAS US  LOWER EXTREMITY VENOUS (DVT) Referring Phys: JEFFREY BEANE --------------------------------------------------------------------------------  Indications: Pain, Swelling, and Erythema. Other Indications: Cellulitis. Risk Factors: Surgery Left TKA 01/16/2024. Anticoagulation: Eliquis . Limitations: Poor ultrasound/tissue interface. Comparison Study: No previous exams Performing Technologist: Jody Hill RVT, RDMS  Examination Guidelines: A complete evaluation includes B-mode imaging, spectral Doppler, color Doppler, and power Doppler as needed of all accessible portions of each vessel. Bilateral testing is considered an integral part of a complete examination. Limited examinations for reoccurring indications may be performed as noted. The reflux portion of the exam is performed with the patient in reverse Trendelenburg.  +-----+---------------+---------+-----------+----------+--------------+ RIGHTCompressibilityPhasicitySpontaneityPropertiesThrombus Aging +-----+---------------+---------+-----------+----------+--------------+ CFV  Full           No       Yes        pulsatile                +-----+---------------+---------+-----------+----------+--------------+   +---------+---------------+---------+-----------+----------+--------------+  LEFT     CompressibilityPhasicitySpontaneityPropertiesThrombus Aging +---------+---------------+---------+-----------+----------+--------------+ CFV      Full           No       Yes        pulsatile                +---------+---------------+---------+-----------+----------+--------------+ SFJ      Full                                                         +---------+---------------+---------+-----------+----------+--------------+ FV Prox  Full           Yes      Yes                                 +---------+---------------+---------+-----------+----------+--------------+ FV Mid   Full           Yes      Yes                                 +---------+---------------+---------+-----------+----------+--------------+ FV DistalFull           Yes      Yes                                 +---------+---------------+---------+-----------+----------+--------------+ PFV      Full                                                        +---------+---------------+---------+-----------+----------+--------------+ POP      Full           Yes      Yes                                 +---------+---------------+---------+-----------+----------+--------------+ PTV      Full                                                        +---------+---------------+---------+-----------+----------+--------------+ PERO     Full                                                        +---------+---------------+---------+-----------+----------+--------------+     Summary: RIGHT: - No evidence of common femoral vein obstruction.   LEFT: - There is no evidence of deep vein thrombosis in the lower extremity.  - No cystic structure found in the popliteal fossa. Subcutaneous edema seen in area of calf and ankle.  *See table(s) above for measurements and observations. Electronically signed by Lonni Gaskins MD on 01/23/2024 at 12:08:52 PM.    Final  DG Tibia/Fibula Left Result Date: 01/22/2024 CLINICAL DATA:  poss post-op infx assessment EXAM: LEFT TIBIA AND FIBULA - 2 VIEW COMPARISON:  None Available. FINDINGS: Subcutaneous surgical clip along the mid to distal tibia medially.No acute fracture or dislocation. No radiographic findings suggestive of osteomyelitis. Soft tissues are unremarkable.  IMPRESSION: No acute fracture or malalignment of the tibia and fibula. Electronically Signed   By: Rogelia Myers M.D.   On: 01/22/2024 16:26   DG Knee Complete 4 Views Left Result Date: 01/22/2024 CLINICAL DATA:  poss post-op infx assessment EXAM: LEFT KNEE - COMPLETE 4+ VIEW COMPARISON:  01/16/2024 FINDINGS: Well-aligned knee arthroplasty without acute fracture.No periprosthetic lucency to suggest loosening.No dislocation. Decreasing, but persistent subcutaneous gas with gas in the joint space. Moderate soft tissue swelling about the knee. Surgical skin staples. IMPRESSION: 1. Moderate soft tissue swelling about the knee. Decreasing, but persistent subcutaneous gas with gas noted in the joint space. These may be expected findings at this stage in the postoperative recovery. Correlation with physical exam findings recommended. 2. Well-aligned knee arthroplasty without acute fracture or dislocation. Electronically Signed   By: Rogelia Myers M.D.   On: 01/22/2024 16:25   DG Chest 2 View Result Date: 01/22/2024 CLINICAL DATA:  weakness EXAM: CHEST - 2 VIEW COMPARISON:  February 20, 2020 FINDINGS: Low lung volumes with elevation of the right hemidiaphragm. Hazy opacities in the left perihilar mid lung. No pneumothorax or pleural effusion. Sternotomy wires and CABG markers. Mild cardiomegaly. Left chest pacemaker with leads terminating in the right atrium and right ventricle. Tortuous aorta with aortic atherosclerosis. No acute fracture or destructive lesions. Multilevel thoracic osteophytosis. IMPRESSION: Hazy opacities in the left perihilar mid lung, which may represent atelectasis or a developing bronchopneumonia, in the correct clinical context no pleural effusion. Electronically Signed   By: Rogelia Myers M.D.   On: 01/22/2024 16:22   DG Knee 1-2 Views Left Result Date: 01/16/2024 CLINICAL DATA:  Post total knee replacement. EXAM: LEFT KNEE - 1-2 VIEW COMPARISON:  09/22/2019 FINDINGS: Evidence of  patient's recent left total knee arthroplasty with prosthetic components intact and normally located. Skin staples over the anterior soft tissues. Fluid within the knee joint compatible recent surgery. Remainder the exam is unremarkable. IMPRESSION: Expected changes post recent left total knee arthroplasty. Electronically Signed   By: Toribio Agreste M.D.   On: 01/16/2024 11:42   CUP PACEART REMOTE DEVICE CHECK Result Date: 01/08/2024 PPM Scheduled remote reviewed. Normal device function.  Presenting rhythm: AS-VP.  2 VHR detections, EGMs consistent with VT, longest 25 beats. Routing to triage for VT > 20 beats per protocol. Next remote transmission per protocol. - CS, CVRS  (Echo, Carotid, EGD, Colonoscopy, ERCP)    Subjective: No complaints feels great today.  Discharge Exam: Vitals:   01/29/24 2231 01/30/24 0517  BP: (!) 133/55 139/69  Pulse: (!) 59 60  Resp:  17  Temp:  98 F (36.7 C)  SpO2:  97%   Vitals:   01/29/24 2156 01/29/24 2231 01/30/24 0500 01/30/24 0517  BP: (!) 128/113 (!) 133/55  139/69  Pulse: (!) 59 (!) 59  60  Resp: 17   17  Temp: 98.1 F (36.7 C)   98 F (36.7 C)  TempSrc:    Oral  SpO2: 100%   97%  Weight:   85.8 kg   Height:        General: Pt is alert, awake, not in acute distress Cardiovascular: RRR, S1/S2 +, no rubs, no gallops Respiratory: CTA bilaterally,  no wheezing, no rhonchi Abdominal: Soft, NT, ND, bowel sounds + Extremities: no edema, no cyanosis    The results of significant diagnostics from this hospitalization (including imaging, microbiology, ancillary and laboratory) are listed below for reference.     Microbiology: Recent Results (from the past 240 hours)  Culture, blood (routine x 2)     Status: None   Collection Time: 01/22/24  4:10 PM   Specimen: BLOOD  Result Value Ref Range Status   Specimen Description   Final    BLOOD LEFT ANTECUBITAL Performed at [facility], [address], [place], [place]  [address]    Special Requests   Final    BOTTLES DRAWN AEROBIC AND ANAEROBIC Blood Culture adequate volume Performed at [facility], [address] [place], [place] [address]    Culture   Final    NO GROWTH 5 DAYS Performed at [facility], [address] [place], [place] [address]    Report Status 01/27/2024 FINAL  Final  MRSA Next Gen by PCR, Nasal     Status: None   Collection Time: 01/24/24  8:12 AM   Specimen: Nasal Mucosa; Nasal Swab  Result Value Ref Range Status   MRSA by PCR Next Gen NOT DETECTED NOT DETECTED Final    Comment: (NOTE) The GeneXpert MRSA Assay (FDA approved for NASAL specimens only), is one component of a comprehensive MRSA colonization surveillance program. It is not intended to diagnose MRSA infection nor to guide or monitor treatment for MRSA infections. Test performance is not FDA approved in patients less than 70 years old. Performed at [facility], [address], [place], [place] [address]   Culture, blood (Routine X 2) w Reflex to ID Panel     Status: None (Preliminary result)   Collection Time: 01/29/24  8:47 AM   Specimen: BLOOD LEFT ARM  Result Value Ref Range Status   Specimen Description   Final    BLOOD LEFT ARM Performed at [facility], [address] [place], [place] [address]    Special Requests   Final    BOTTLES DRAWN AEROBIC AND ANAEROBIC Blood Culture adequate volume Performed at [facility], [address], [place], [place] [address]    Culture   Final    NO GROWTH < 24 HOURS Performed at [facility], [address] [place], [place] [address]    Report Status PENDING  Incomplete  Culture, blood (Routine X 2) w Reflex to ID Panel     Status: None (Preliminary result)   Collection Time: 01/29/24  8:47 AM   Specimen: BLOOD RIGHT ARM  Result Value Ref Range Status   Specimen Description   Final    BLOOD RIGHT ARM Performed at [facility], [address] [place], [place] [address]    Special Requests   Final    BOTTLES DRAWN AEROBIC AND ANAEROBIC Blood Culture adequate volume Performed at [facility], [address], [place], [place] [address]    Culture   Final    NO GROWTH < 24 HOURS Performed at [facility], [address], [place], [place] [address]    Report Status PENDING  Incomplete     Labs: BNP (last 3 results) No results for input(s): BNP in the last 8760 hours. Basic Metabolic Panel: Recent Labs  Lab 01/27/24 0439 01/27/24 1702 01/28/24 0506 01/29/24 0443 01/30/24 0416  NA 137 138  138 139 136  K 5.3* 5.0 4.9 4.6 4.7  CL 105 106 106 105 106  CO2 21* 21* 24 21* 21*  GLUCOSE 142* 125* 106* 114* 118*  BUN 33* 33* 33* 45* 46*  CREATININE 1.07 1.07 1.12 1.31* 1.20  CALCIUM  9.3 9.4 9.0 9.6 9.3   Liver Function Tests: No results for input(s): AST, ALT, ALKPHOS, BILITOT, PROT, ALBUMIN in the last 168 hours. Recent Labs  Lab 01/29/24 1338  LIPASE 45   No results for input(s): AMMONIA in the last 168 hours. CBC: Recent Labs  Lab 01/26/24 0302 01/27/24 0439 01/28/24 0506 01/29/24 0443 01/30/24 0416  WBC 13.8* 15.7* 15.4* 21.1* 17.6*  HGB 9.4* 10.7* 10.4* 11.7* 10.5*  HCT 29.6* 34.2* 33.1* 38.0* 32.6*  MCV 94.3 95.3 93.5 96.2 92.6  PLT 371 452* 451* 470* 432*   Cardiac Enzymes: No results for input(s): CKTOTAL, CKMB, CKMBINDEX, TROPONINI in the last 168 hours. BNP: Invalid input(s): POCBNP CBG: No results for input(s): GLUCAP in the last 168 hours. D-Dimer No results for input(s): DDIMER in the last 72 hours. Hgb A1c No results for input(s): HGBA1C in the last 72 hours. Lipid Profile No results for input(s): CHOL, HDL, LDLCALC, TRIG, CHOLHDL, LDLDIRECT in the last 72 hours. Thyroid function studies No results for input(s): TSH, T4TOTAL, T3FREE, THYROIDAB in the last 72 hours.  Invalid input(s):  FREET3 Anemia work up No results for input(s): VITAMINB12, FOLATE, FERRITIN, TIBC, IRON, RETICCTPCT in the last 72 hours. Urinalysis    Component Value Date/Time   COLORURINE YELLOW 01/29/2024 1022   APPEARANCEUR HAZY (A) 01/29/2024 1022   LABSPEC 1.024 01/29/2024 1022   PHURINE 5.0 01/29/2024 1022   GLUCOSEU NEGATIVE 01/29/2024 1022   HGBUR MODERATE (A) 01/29/2024 1022   BILIRUBINUR NEGATIVE 01/29/2024 1022   KETONESUR 5 (A) 01/29/2024 1022   PROTEINUR 100 (A) 01/29/2024 1022   NITRITE NEGATIVE 01/29/2024 1022   LEUKOCYTESUR NEGATIVE 01/29/2024 1022   Sepsis Labs Recent Labs  Lab 01/27/24 0439 01/28/24 0506 01/29/24 0443 01/30/24 0416  WBC 15.7* 15.4* 21.1* 17.6*   Microbiology Recent Results (from the past 240 hours)  Culture, blood (routine x 2)     Status: None   Collection Time: 01/22/24  4:10 PM   Specimen: BLOOD  Result Value Ref Range Status   Specimen Description   Final    BLOOD LEFT ANTECUBITAL Performed at [facility], [address] [place], [place] [address]    Special Requests   Final    BOTTLES DRAWN AEROBIC AND ANAEROBIC Blood Culture adequate volume Performed at [facility], [address], [place], [place] [address]    Culture   Final    NO GROWTH 5 DAYS Performed at [facility], [address] [place], [place] [address]    Report Status 01/27/2024 FINAL  Final  MRSA Next Gen by PCR, Nasal     Status: None   Collection Time: 01/24/24  8:12 AM   Specimen: Nasal Mucosa; Nasal Swab  Result Value Ref Range Status   MRSA by PCR Next Gen NOT DETECTED NOT DETECTED Final    Comment: (NOTE) The GeneXpert MRSA Assay (FDA approved for NASAL specimens only), is one component of a comprehensive MRSA colonization surveillance program. It is not intended to diagnose MRSA infection nor to guide or monitor treatment for MRSA infections. Test performance is not FDA approved in patients less than 50  years old. Performed at [facility], [address]  [place], [place] [address]   Culture, blood (Routine X 2) w Reflex to ID Panel     Status: None (Preliminary result)   Collection Time: 01/29/24  8:47 AM   Specimen: BLOOD LEFT ARM  Result Value Ref Range Status   Specimen Description   Final    BLOOD LEFT ARM Performed at [facility], [address] [place], [place] [address]    Special Requests   Final    BOTTLES DRAWN AEROBIC AND ANAEROBIC Blood Culture adequate volume Performed at [facility], [address], [place], [place] [address]    Culture   Final    NO GROWTH < 24 HOURS Performed at [facility], [address], [place], [place] [address]    Report Status PENDING  Incomplete  Culture, blood (Routine X 2) w Reflex to ID Panel     Status: None (Preliminary result)   Collection Time: 01/29/24  8:47 AM   Specimen: BLOOD RIGHT ARM  Result Value Ref Range Status   Specimen Description   Final    BLOOD RIGHT ARM Performed at [facility], [address] [place], [place] [address]    Special Requests   Final    BOTTLES DRAWN AEROBIC AND ANAEROBIC Blood Culture adequate volume Performed at [facility], [address], [place], [place] [address]    Culture   Final    NO GROWTH < 24 HOURS Performed at [facility], [address] [place], [place] [address]    Report Status PENDING  Incomplete     Time coordinating discharge: Over 30 minutes  SIGNED:   Erle Odell Castor, MD  [facility] 01/30/2024, 10:39 AM Pager   If 7PM-7AM, please contact night-coverage [email or web address] Password [email or web address]
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
## 2024-01-30 NOTE — Care Management Important Message (Signed)
 Important Message  Patient Details IM Letter given. Name: William Hatfield MRN: [id number] Date of Birth: 11-02-1945   Important Message Given:  Yes - Medicare IM     Melba Ates 01/30/2024, 11:53 AM

## 2024-01-30 NOTE — Consult Note (Signed)
 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 Apply Xeroform gauze to wound bed daily.  Secure with silicone foam or ABD pad and clothe tape whichever is preferred.     WOC Nurse team will follow with you and see patient within 10 days for wound assessments.  Please notify WOC nurses of any acute changes in the wounds or any new areas of concern   Thank you,  Doyal Polite, MSN, RN, [facility] WOC Team [phone] (Available Mon-Fri 0700-1500)

## 2024-01-30 NOTE — Progress Notes (Signed)
 [facility] PROGRESS NOTE    Progress Note  William Hatfield  FMW:[id number] DOB: February 24, 1946 DOA: 01/22/2024 PCP: Henriette Anes, DO     Brief Narrative:   William Hatfield is an 78 y.o. male past medical history of essential hypertension, CAD status post CABG, paroxysmal atrial fibrillation on Eliquis , chronic kidney disease stage III AA status post total left knee arthroplasty on 01/16/2024, now presenting with fever nausea fatigue left leg redness and swelling, admitted for suspected left lower extremity cellulitis started on broad-spectrum antibiotics, ultimately was diagnosed with pneumonia VQ scan was negative for PE due to complaints of chest pain cardiology was consulted in the setting of CAD.  Assessment/Plan:   HCAP (healthcare-associated pneumonia) Started empirically on cefepime  and has completed 7-day course. Initial blood culture on 01/22/2024 has been negative to date.  Leukocytosis possibly due to infected stage III sacral decubitus ulcer not present on admission : On IV cefepime  and doxycycline .  De-escalate to Augmentin  and doxycycline . Has remained afebrile leukocytosis improving. Out of bed to chair. Blood cultures on 01/29/2024 has remained negative till date. Continue to work with physical therapy. Inpatient rehab has been consulted.  Left lower extremity cellulitis has been ruled out:  lower extremity Doppler was negative for DVT Orthopedic surgery was consulted they feel that the erythema is not likely from infection, rather more postop changes.  Elevated D-dimer: Likely due to infectious etiology VQ scan was negative.  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 2D echo was done that showed preserved EF no wall motion abnormality. Imdur  was held, orthostatics were negative. Cardiology on board was given a dose of IV Lasix . Low suspicious for ischemic chest  pain, elevation in troponin was likely due to demand ischemia in the setting of pneumonia. Due to his orthostasis Imdur  was held. Resume home dose of Lasix .  Paroxysmal atrial fibrillation: Rate controlled continue metoprolol  and Eliquis .  History of complete heart block history of pacemaker placement in 2021: Telemetry stable can be discontinued.  Acute kidney injury on chronic kidney disease stage IIIa: Holding losartan  and Jardiance . His creatinine has returned to baseline. Can resume antihypertensive medications as tolerated as blood pressure starts rising.  Constipation: Continue bowel regimen.   DVT prophylaxis: lovenox Family Communication:Daughter Status is: Inpatient Remains inpatient appropriate because: Community-acquired pneumonia    Code Status:     Code Status Orders  (From admission, onward)           Start     Ordered   01/22/24 1704  Full code  Continuous       Question:  By:  Answer:  Consent: discussion documented in EHR   01/22/24 1707           Code Status History     Date Active Date Inactive Code Status Order ID Comments User Context   01/16/2024 1203 01/18/2024 1732 Full Code [id number]  Duwayne Purchase, MD Inpatient   10/02/2022 1516 10/03/2022 1846 Full Code [id number]  Bethanie Cough, PA-C Inpatient   05/26/2022 1507 05/29/2022 1550 Full Code [id number]  Elna Ahmed SQUIBB, PA-C Inpatient   05/22/2022 1210 05/23/2022 1415 Full Code [id number]  Bethanie Cough, PA-C Inpatient   01/12/2020 1434 01/12/2020 2329 Full Code [id number]  Waddell Danelle ORN, MD Inpatient   09/18/2016 0759 09/20/2016 2104 Full Code [id number]  Johnson, Laymon HERO, PA-C Inpatient         IV Access:   Peripheral IV  Procedures and diagnostic studies:   DG CHEST PORT 1 VIEW Result Date: 01/29/2024 EXAM: 1 VIEW(S) XRAY OF THE CHEST 01/29/2024 08:01:00 AM COMPARISON: 01/25/2024 CLINICAL HISTORY: Pneumonia FINDINGS: LINES, TUBES AND DEVICES: Left chest cardiac pacing  device. LUNGS AND PLEURA: Low lung volumes. Trace bilateral pleural effusions. Mild pulmonary edema with similar to mildly decreased patchy opacities in left mid to lower lung. No pneumothorax. HEART AND MEDIASTINUM: Post median sternotomy and CABG. Atherosclerotic calcifications. No acute abnormality of the cardiac and mediastinal silhouettes. BONES AND SOFT TISSUES: Post median sternotomy. No acute osseous abnormality. IMPRESSION: 1. Mild pulmonary edema with similar to mildly decreased patchy opacities in the left mid to lower lung, consistent with pneumonia. 2. Trace bilateral pleural effusions. Electronically signed by: Waddell Calk MD 01/29/2024 08:14 AM EST RP Workstation: [id number]     Medical Consultants:   None.   Subjective:    William Hatfield has no new complaints feels better than yesterday.  Objective:    Vitals:   01/29/24 2156 01/29/24 2231 01/30/24 0500 01/30/24 0517  BP: (!) 128/113 (!) 133/55  139/69  Pulse: (!) 59 (!) 59  60  Resp: 17   17  Temp: 98.1 F (36.7 C)   98 F (36.7 C)  TempSrc:    Oral  SpO2: 100%   97%  Weight:   85.8 kg   Height:       SpO2: 97 % O2 Flow Rate (L/min): 2 L/min   Intake/Output Summary (Last 24 hours) at 01/30/2024 0856 Last data filed at 01/30/2024 0748 Gross per 24 hour  Intake 1040.09 ml  Output 650 ml  Net 390.09 ml   Filed Weights   01/28/24 1022 01/29/24 0700 01/30/24 0500  Weight: 84.9 kg 84.9 kg 85.8 kg    Exam: General exam: In no acute distress. Respiratory system: Good air movement and clear to auscultation. Cardiovascular system: S1 & S2 heard, RRR. No JVD, murmurs, rubs, gallops or clicks.  Gastrointestinal system: Abdomen is nondistended, soft and nontender.  Extremities: No pedal edema. Skin: Psychiatry: Judgement and insight appear normal. Mood & affect appropriate.    Data Reviewed:    Labs: Basic Metabolic Panel: Recent Labs  Lab 01/27/24 0439 01/27/24 1702 01/28/24 0506 01/29/24 0443  01/30/24 0416  NA 137 138 138 139 136  K 5.3* 5.0 4.9 4.6 4.7  CL 105 106 106 105 106  CO2 21* 21* 24 21* 21*  GLUCOSE 142* 125* 106* 114* 118*  BUN 33* 33* 33* 45* 46*  CREATININE 1.07 1.07 1.12 1.31* 1.20  CALCIUM  9.3 9.4 9.0 9.6 9.3   GFR Estimated Creatinine Clearance: 55.8 mL/min (by C-G formula based on SCr of 1.2 mg/dL). Liver Function Tests: No results for input(s): AST, ALT, ALKPHOS, BILITOT, PROT, ALBUMIN in the last 168 hours. Recent Labs  Lab 01/29/24 1338  LIPASE 45   No results for input(s): AMMONIA in the last 168 hours. Coagulation profile No results for input(s): INR, PROTIME in the last 168 hours. COVID-19 Labs  No results for input(s): DDIMER, FERRITIN, LDH, CRP in the last 72 hours.  Lab Results  Component Value Date   SARSCOV2NAA POSITIVE (A) 02/20/2020   SARSCOV2NAA NEGATIVE 01/09/2020    CBC: Recent Labs  Lab 01/26/24 0302 01/27/24 0439 01/28/24 0506 01/29/24 0443 01/30/24 0416  WBC 13.8* 15.7* 15.4* 21.1* 17.6*  HGB 9.4* 10.7* 10.4* 11.7* 10.5*  HCT 29.6* 34.2* 33.1* 38.0* 32.6*  MCV 94.3 95.3 93.5 96.2 92.6  PLT 371 452* 451* 470* 432*  Cardiac Enzymes: No results for input(s): CKTOTAL, CKMB, CKMBINDEX, TROPONINI in the last 168 hours. BNP (last 3 results) Recent Labs    01/29/24 0443  PROBNP 16,192.0*   CBG: No results for input(s): GLUCAP in the last 168 hours. D-Dimer: No results for input(s): DDIMER in the last 72 hours. Hgb A1c: No results for input(s): HGBA1C in the last 72 hours. Lipid Profile: No results for input(s): CHOL, HDL, LDLCALC, TRIG, CHOLHDL, LDLDIRECT in the last 72 hours. Thyroid function studies: No results for input(s): TSH, T4TOTAL, T3FREE, THYROIDAB in the last 72 hours.  Invalid input(s): FREET3 Anemia work up: No results for input(s): VITAMINB12, FOLATE, FERRITIN, TIBC, IRON, RETICCTPCT in the last 72 hours. Sepsis  Labs: Recent Labs  Lab 01/27/24 0439 01/28/24 0506 01/29/24 0443 01/30/24 0416  WBC 15.7* 15.4* 21.1* 17.6*   Microbiology Recent Results (from the past 240 hours)  Culture, blood (routine x 2)     Status: None   Collection Time: 01/22/24  4:10 PM   Specimen: BLOOD  Result Value Ref Range Status   Specimen Description   Final    BLOOD LEFT ANTECUBITAL Performed at [facility], [address] [place], [place] [address]    Special Requests   Final    BOTTLES DRAWN AEROBIC AND ANAEROBIC Blood Culture adequate volume Performed at [facility], [address], [place], [place] [address]    Culture   Final    NO GROWTH 5 DAYS Performed at [facility], [address], [place], [place] [address]    Report Status 01/27/2024 FINAL  Final  MRSA Next Gen by PCR, Nasal     Status: None   Collection Time: 01/24/24  8:12 AM   Specimen: Nasal Mucosa; Nasal Swab  Result Value Ref Range Status   MRSA by PCR Next Gen NOT DETECTED NOT DETECTED Final    Comment: (NOTE) The GeneXpert MRSA Assay (FDA approved for NASAL specimens only), is one component of a comprehensive MRSA colonization surveillance program. It is not intended to diagnose MRSA infection nor to guide or monitor treatment for MRSA infections. Test performance is not FDA approved in patients less than 7 years old. Performed at [facility], [address] [place], [place] [address]   Culture, blood (Routine X 2) w Reflex to ID Panel     Status: None (Preliminary result)   Collection Time: 01/29/24  8:47 AM   Specimen: BLOOD LEFT ARM  Result Value Ref Range Status   Specimen Description   Final    BLOOD LEFT ARM Performed at [facility], [address], [place], [place] [address]    Special Requests   Final    BOTTLES DRAWN AEROBIC AND ANAEROBIC Blood Culture adequate volume Performed at [facility], [address]  [place], [place] [address]    Culture   Final    NO GROWTH < 24 HOURS Performed at [facility], [address] [place], [place] [address]    Report Status PENDING  Incomplete  Culture, blood (Routine X 2) w Reflex to ID Panel     Status: None (Preliminary result)   Collection Time: 01/29/24  8:47 AM   Specimen: BLOOD RIGHT ARM  Result Value Ref Range Status   Specimen Description   Final    BLOOD RIGHT ARM Performed at [facility], [address], [place], [place] [address]    Special Requests   Final  BOTTLES DRAWN AEROBIC AND ANAEROBIC Blood Culture adequate volume Performed at [facility], [address] [place], [place] [address]    Culture   Final    NO GROWTH < 24 HOURS Performed at [facility], [address] [place], [place] [address]    Report Status PENDING  Incomplete     Medications:    allopurinol   100 mg Oral Q0600   apixaban   5 mg Oral BID   atorvastatin   40 mg Oral QPM   docusate sodium   100 mg Oral BID   doxycycline  100 mg Oral Q12H   furosemide   40 mg Oral Daily   metoprolol  tartrate  25 mg Oral BID   polyethylene glycol  17 g Oral Daily   senna  1 tablet Oral QHS   sodium chloride  flush  3 mL Intravenous Q12H   Continuous Infusions:    LOS: 8 days   William Hatfield  [facility]  01/30/2024, 8:56 AM

## 2024-01-31 DIAGNOSIS — K5901 Slow transit constipation: Secondary | ICD-10-CM | POA: Diagnosis not present

## 2024-01-31 DIAGNOSIS — Z96652 Presence of left artificial knee joint: Secondary | ICD-10-CM | POA: Diagnosis not present

## 2024-01-31 DIAGNOSIS — J189 Pneumonia, unspecified organism: Secondary | ICD-10-CM | POA: Diagnosis not present

## 2024-01-31 DIAGNOSIS — N1831 Chronic kidney disease, stage 3a: Secondary | ICD-10-CM

## 2024-01-31 DIAGNOSIS — R5381 Other malaise: Secondary | ICD-10-CM | POA: Diagnosis not present

## 2024-01-31 DIAGNOSIS — N179 Acute kidney failure, unspecified: Secondary | ICD-10-CM

## 2024-01-31 LAB — COMPREHENSIVE METABOLIC PANEL WITH GFR
ALT: 28 U/L (ref 0–44)
AST: 21 U/L (ref 15–41)
Albumin: 2.3 g/dL — ABNORMAL LOW (ref 3.5–5.0)
Alkaline Phosphatase: 85 U/L (ref 38–126)
Anion gap: 8 (ref 5–15)
BUN: 40 mg/dL — ABNORMAL HIGH (ref 8–23)
CO2: 23 mmol/L (ref 22–32)
Calcium: 8.8 mg/dL — ABNORMAL LOW (ref 8.9–10.3)
Chloride: 105 mmol/L (ref 98–111)
Creatinine, Ser: 1.42 mg/dL — ABNORMAL HIGH (ref 0.61–1.24)
GFR, Estimated: 51 mL/min — ABNORMAL LOW (ref >60)
Glucose, Bld: 136 mg/dL — ABNORMAL HIGH (ref 70–99)
Potassium: 4.3 mmol/L (ref 3.5–5.1)
Sodium: 136 mmol/L (ref 135–145)
Total Bilirubin: 0.9 mg/dL (ref 0.0–1.2)
Total Protein: 5.3 g/dL — ABNORMAL LOW (ref 6.5–8.1)

## 2024-01-31 LAB — CBC WITH DIFFERENTIAL/PLATELET
Abs Immature Granulocytes: 0.12 K/uL — ABNORMAL HIGH (ref 0.00–0.07)
Basophils Absolute: 0.1 K/uL (ref 0.0–0.1)
Basophils Relative: 0 %
Eosinophils Absolute: 0.1 K/uL (ref 0.0–0.5)
Eosinophils Relative: 1 %
HCT: 31.9 % — ABNORMAL LOW (ref 39.0–52.0)
Hemoglobin: 10.4 g/dL — ABNORMAL LOW (ref 13.0–17.0)
Immature Granulocytes: 1 %
Lymphocytes Relative: 8 %
Lymphs Abs: 1.3 K/uL (ref 0.7–4.0)
MCH: 29.6 pg (ref 26.0–34.0)
MCHC: 32.6 g/dL (ref 30.0–36.0)
MCV: 90.9 fL (ref 80.0–100.0)
Monocytes Absolute: 1.3 K/uL — ABNORMAL HIGH (ref 0.1–1.0)
Monocytes Relative: 9 %
Neutro Abs: 12.3 K/uL — ABNORMAL HIGH (ref 1.7–7.7)
Neutrophils Relative %: 81 %
Platelets: 418 K/uL — ABNORMAL HIGH (ref 150–400)
RBC: 3.51 MIL/uL — ABNORMAL LOW (ref 4.22–5.81)
RDW: 15.1 % (ref 11.5–15.5)
WBC: 15.2 K/uL — ABNORMAL HIGH (ref 4.0–10.5)
nRBC: 0 % (ref 0.0–0.2)

## 2024-01-31 MED ORDER — GERHARDT'S BUTT CREAM
TOPICAL_CREAM | Freq: Two times a day (BID) | CUTANEOUS | Status: AC
  Filled 2024-01-31: qty 60

## 2024-01-31 MED ORDER — SORBITOL 70 % SOLN
60.0000 mL | Status: AC
  Administered 2024-01-31: 60 mL via ORAL
  Filled 2024-01-31: qty 60

## 2024-01-31 NOTE — Progress Notes (Signed)
 Inpatient Rehabilitation Center Individual Statement of Services  Patient Name:  William Hatfield  Date:  01/31/2024  Welcome to the Inpatient Rehabilitation Center.  Our goal is to provide you with an individualized program based on your diagnosis and situation, designed to meet your specific needs.  With this comprehensive rehabilitation program, you will be expected to participate in at least 3 hours of rehabilitation therapies Monday-Friday, with modified therapy programming on the weekends.  Your rehabilitation program will include the following services:  Physical Therapy (PT), Occupational Therapy (OT), Speech Therapy (ST), 24 hour per day rehabilitation nursing, Therapeutic Recreaction (TR), Psychology, Care Coordinator, Rehabilitation Medicine, Nutrition Services, and Pharmacy Services  Weekly team conferences will be held on Wednesday to discuss your progress.  Your Inpatient Rehabilitation Care Coordinator will talk with you frequently to get your input and to update you on team discussions.  Team conferences with you and your family in attendance may also be held.  Expected length of stay: 10-12 days    Overall anticipated outcome: Independent with assistive device   Depending on your progress and recovery, your program may change. Your Inpatient Rehabilitation Care Coordinator will coordinate services and will keep you informed of any changes. Your Inpatient Rehabilitation Care Coordinator's name and contact numbers are listed  below.  The following services may also be recommended but are not provided by the Inpatient Rehabilitation Center:  Driving Evaluations Home Health Rehabiltiation Services Outpatient Rehabilitation Services Vocational Rehabilitation   Arrangements will be made to provide these services after discharge if needed.  Arrangements include referral to agencies that provide these services.  Your insurance has been verified to be: MEDICARE / MEDICARE PART A AND B   Your primary doctor is:  Henriette Anes, DO  Pertinent information will be shared with your doctor and your insurance company.  Inpatient Rehabilitation Care Coordinator:  Di'Asia Loreli SIERRAS [phone] or ELIGAH BRINKS  Information discussed with and copy given to patient by: Waverly Loreli, 01/31/2024, 1:00 PM
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
## 2024-01-31 NOTE — Progress Notes (Addendum)
 PROGRESS NOTE   Subjective/Complaints: Pt couldn't get settled last night, didn't sleep all that well. Denies pain being an issue. Has ice pack on left knee this morning. Hasn't moved his bowels since 11/30. +flatus   ROS: Patient denies fever, rash, sore throat, blurred vision, dizziness, nausea, vomiting, diarrhea, cough, shortness of breath or chest pain,   headache, or mood change.    Objective:   No results found. Recent Labs    01/30/24 0416 01/31/24 0508  WBC 17.6* 15.2*  HGB 10.5* 10.4*  HCT 32.6* 31.9*  PLT 432* 418*   Recent Labs    01/30/24 0416 01/31/24 0508  NA 136 136  K 4.7 4.3  CL 106 105  CO2 21* 23  GLUCOSE 118* 136*  BUN 46* 40*  CREATININE 1.20 1.42*  CALCIUM  9.3 8.8*    Intake/Output Summary (Last 24 hours) at 01/31/2024 1001 Last data filed at 01/31/2024 9276 Gross per 24 hour  Intake 1167.66 ml  Output 1550 ml  Net -382.34 ml     Wound 01/30/24 1425 Pressure Injury Buttocks Right Stage 2 -  Partial thickness loss of dermis presenting as a shallow open injury with a red, pink wound bed without slough. (Active)    Physical Exam: Vital Signs Blood pressure 134/65, pulse 61, temperature 98.2 F (36.8 C), temperature source Oral, resp. rate 19, height 5' 11 (1.803 m), weight 84.9 kg, SpO2 97%.  General: Alert and oriented x 3, No apparent distress HEENT: Head is normocephalic, atraumatic, PERRLA, EOMI, sclera anicteric, oral mucosa pink and moist, dentition intact, ext ear canals clear,  Neck: Supple without JVD or lymphadenopathy Heart: Reg rate and rhythm. No murmurs rubs or gallops Chest: CTA bilaterally without wheezes, rales, or rhonchi; no distress Abdomen: Soft, non-tender, non-distended, bowel sounds positive. Extremities: No clubbing, cyanosis, or edema. Pulses are 2+ Psych: Pt's affect is appropriate. Pt is cooperative Skin: staples intact left knee, erythema around inferior  aspect of incision. Right buttock wound Neuro:  Alert and oriented x 3. Normal insight and awareness. Intact Memory. Normal language and speech. Cranial nerve exam unremarkable. MMT: RUE 5/5. LUEsl limited proximally by pain, 5/5 distally. RLE 4 to 5/5. LLE limited by knee pain/swelling ~2/5 to 4+/5 at ankle. Sensory exam normal for light touch and pain in all 4 limbs. No limb ataxia or cerebellar signs. No abnormal tone appreciated.  .   Musculoskeletal: left knee still swollen and tender. I was able to passively flex the knee to about 90 degrees.     Assessment/Plan: 1. Functional deficits which require 3+ hours per day of interdisciplinary therapy in a comprehensive inpatient rehab setting. Physiatrist is providing close team supervision and 24 hour management of active medical problems listed below. Physiatrist and rehab team continue to assess barriers to discharge/monitor patient progress toward functional and medical goals  Care Tool:  Bathing              Bathing assist       Upper Body Dressing/Undressing Upper body dressing        Upper body assist      Lower Body Dressing/Undressing Lower body dressing  Lower body assist       Toileting Toileting    Toileting assist       Transfers Chair/bed transfer  Transfers assist           Locomotion Ambulation   Ambulation assist              Walk 10 feet activity   Assist           Walk 50 feet activity   Assist           Walk 150 feet activity   Assist           Walk 10 feet on uneven surface  activity   Assist           Wheelchair     Assist               Wheelchair 50 feet with 2 turns activity    Assist            Wheelchair 150 feet activity     Assist          Blood pressure 134/65, pulse 61, temperature 98.2 F (36.8 C), temperature source Oral, resp. rate 19, height 5' 11 (1.803 m), weight 84.9 kg, SpO2  97%.  Medical Problem List and Plan: 1. 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   weightbearing as tolerated             -patient may shower             -ELOS/Goals: 8-12 days modI             -Patient is beginning CIR therapies today including PT and OT    -needs to work on knee flexion 2.  Antithrombotics: -DVT/anticoagulation:  Pharmaceutical: Eliquis .  Venous Doppler studies nuclear medicine perfusion scan negative             -antiplatelet therapy: N/A   3. Pain Management: continue Robaxin  and oxycodone  as needed   4. Mood/Behavior/Sleep: Provide emotional support             -antipsychotic agents: N/A 5. Neuropsych/cognition: This patient is capable of making decisions on his own behalf.   6. Skin/Wound Care/pressure injury buttocks: WOC consulted for dressing wound care routine skin checks   -remove staples tomorrow 12/5 7. Fluids/Electrolytes/Nutrition: Routine in and outs with follow-up chemistries   8.  Leukocytosis/HCAP.  Completing 7-day course cefepime  as well as Bactrim.  MRSA PCR negative   12/4 wbcs down to 15.2k today 9.  Elevated troponin/PAF/PVCs/NSVT with history of CHB status post PPM.  Continue chronic Eliquis  as well as Lopressor  25 mg twice daily.  Imdur  currently on hold.  Follow-up cardiology services.   Elevated troponin felt to be related to demand  ischemia as well as related to pneumonia.   10.  CAD/CABG 2006.  Followed by cardiology services   11.  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  Follow-up chemistries.  Losartan  and Jardiance  currently on hold   12/4 Cr with bump to 1.4 today. Will recheck tomorrow to make sure there is no progression.  12.  Hyperlipidemia: continue Lipitor   13.  Constipation: continue MiraLAX  daily, Senokot 1 tablet nightly  -sorbitol today if no bm this morning, SSE later PRN 14. Post-op anemia/?ACD  -hgb holding steady at 10.4    LOS: 1 days A FACE TO  FACE EVALUATION WAS PERFORMED  Arthea ONEIDA Gunther 01/31/2024, 10:01 AM

## 2024-01-31 NOTE — Progress Notes (Signed)
 Inpatient Rehabilitation  Patient information reviewed and entered into eRehab system by [facility]. Karen Kays., CCC/SLP, PPS Coordinator.  Information including medical coding, functional ability and quality indicators will be reviewed and updated through discharge.
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
## 2024-01-31 NOTE — Progress Notes (Addendum)
 Inpatient Rehabilitation Admission Medication Review by a Pharmacist  A complete drug regimen review was completed for this patient to identify any potential clinically significant medication issues.  High Risk Drug Classes Is patient taking? Indication by Medication  Antipsychotic No   Anticoagulant Yes Apixaban  - AF  Antibiotic Yes Septra to complete 7d - ulcers  Opioid Yes Oxycodone  - pain  Antiplatelet No   Hypoglycemics/insulin No   Vasoactive Medication Yes Furosemide  - CHF Metoprolol  - HTN  Chemotherapy No   Other Yes APAP - pain Allopurinol  - gout Lipitor - HLD Robaxin  - spasms Protonix  - pain Tigan  - N/V     Type of Medication Issue Identified Description of Issue Recommendation(s)  Drug Interaction(s) (clinically significant)     Duplicate Therapy     Allergy     No Medication Administration End Date  Septra Ok to stop at 7d per plan - Dan Angiulli  Incorrect Dose     Additional Drug Therapy Needed     Significant med changes from prior encounter (inform family/care partners about these prior to discharge).    Other  Jardiance , nitroglycerin  Resume as needed in CIR or at discharge    Clinically significant medication issues were identified that warrant physician communication and completion of prescribed/recommended actions by midnight of the next day:  No  Name of provider notified for urgent issues identified:   Provider Method of Notification:     Pharmacist comments:   Time spent performing this drug regimen review (minutes):  20   Sergio Batch, PharmD, [place], AAHIVP, CPP Infectious Disease Pharmacist 01/31/2024 7:39 AM

## 2024-01-31 NOTE — Progress Notes (Signed)
 Patient ID: William Hatfield, male   DOB: 1945/03/25, 78 y.o.   MRN: [id number]  Statement of service delivered.

## 2024-01-31 NOTE — Progress Notes (Signed)
 Met with patient and talked to daughter and reviewed current situation, team conference and plan of care. Reviewed incision care, wound care, safety, medications. Continue to follow along to provide educational needs to facilitate preparation for discharge.
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
## 2024-02-01 DIAGNOSIS — J189 Pneumonia, unspecified organism: Secondary | ICD-10-CM | POA: Diagnosis not present

## 2024-02-01 DIAGNOSIS — Z96652 Presence of left artificial knee joint: Secondary | ICD-10-CM | POA: Diagnosis not present

## 2024-02-01 DIAGNOSIS — R5381 Other malaise: Secondary | ICD-10-CM | POA: Diagnosis not present

## 2024-02-01 DIAGNOSIS — K5901 Slow transit constipation: Secondary | ICD-10-CM | POA: Diagnosis not present

## 2024-02-01 LAB — BASIC METABOLIC PANEL WITH GFR
Anion gap: 12 (ref 5–15)
BUN: 38 mg/dL — ABNORMAL HIGH (ref 8–23)
CO2: 20 mmol/L — ABNORMAL LOW (ref 22–32)
Calcium: 9 mg/dL (ref 8.9–10.3)
Chloride: 102 mmol/L (ref 98–111)
Creatinine, Ser: 1.48 mg/dL — ABNORMAL HIGH (ref 0.61–1.24)
GFR, Estimated: 49 mL/min — ABNORMAL LOW (ref >60)
Glucose, Bld: 122 mg/dL — ABNORMAL HIGH (ref 70–99)
Potassium: 4.5 mmol/L (ref 3.5–5.1)
Sodium: 134 mmol/L — ABNORMAL LOW (ref 135–145)

## 2024-02-01 MED ORDER — SENNOSIDES-DOCUSATE SODIUM 8.6-50 MG PO TABS
2.0000 | ORAL_TABLET | Freq: Every day | ORAL | Status: DC
  Administered 2024-02-01 – 2024-02-06 (×4): 2 via ORAL
  Filled 2024-02-01 (×6): qty 2

## 2024-02-01 MED ORDER — FUROSEMIDE 40 MG PO TABS
40.0000 mg | ORAL_TABLET | Freq: Every day | ORAL | Status: DC
  Administered 2024-02-03: 40 mg via ORAL
  Filled 2024-02-01: qty 1

## 2024-02-01 NOTE — Progress Notes (Signed)
 Occupational Therapy Session Note  Patient Details  Name: William Hatfield MRN: [id number] Date of Birth: 12-28-1945  Today's Date: 02/01/2024 OT Individual Time: 1100-1143 OT Individual Time Calculation (min): 43 min    Short Term Goals: Week 1:  OT Short Term Goal 1 (Week 1): STG=LTG d/t ELOS   Skilled Therapeutic Interventions/Progress Updates:    Pt up in recliner at time of session, no pain but fatigued. Pt has had very busy morning with several OT/PT sessions but agreeable to session. Initial focus on achieving L knee extension with gait belt as pt wanting to know how he can work on straightening knee in room. Ed on importance of extension and preventing leaving in flexion. Pt stating he was having trouble getting up/down from toilet and OT retrieving BSC and placing over toilet - pt wanting to wait to trial later. While discussing DC planning, supports at home, family, home layout, etc pt mentioning underlying Parkinson's - educated on weight bearing, finger flicks, positions for eating, and techniques to improve ADLs and pt grateful. Up in recliner alarm on call bell in reach.   Therapy Documentation Precautions:  Precautions Precautions: Fall, Knee Precaution/Restrictions Comments: Reviewed importance of terminal knee extension, elevation in extension  no pillow behind knee. Restrictions Weight Bearing Restrictions Per Provider Order: No LLE Weight Bearing Per Provider Order: Weight bearing as tolerated Other Position/Activity Restrictions: WBAT    Therapy/Group: Individual Therapy  Chiquita [name] Hopping 02/01/2024, 11:49 AM

## 2024-02-01 NOTE — IPOC Note (Signed)
 Overall Plan of Care [facility]) Patient Details Name: William Hatfield MRN: [id number] DOB: [birth year]  Admitting Diagnosis: HCAP (healthcare-associated pneumonia)  Hospital Problems: Principal Problem:   HCAP (healthcare-associated pneumonia)     Functional Problem List: Nursing Bladder, Bowel, Edema, Endurance, Medication Management, Pain, Safety, Skin Integrity  PT Balance, Edema, Endurance, Motor, Pain, Sensory, Skin Integrity  OT Balance, Pain, Motor, Endurance, Skin Integrity, Sensory, Safety  SLP    TR         Basic ADL's: OT Eating, Grooming, Bathing, Dressing, Toileting     Advanced  ADL's: OT None     Transfers: PT Bed Mobility, Bed to Chair, Car, [employer], Civil Service Fast Streamer, Research Scientist (life Sciences): PT Ambulation, Psychologist, [employer], Stairs     Additional Impairments: OT None  SLP        TR      Anticipated Outcomes Item Anticipated Outcome  Self Feeding mod I  Swallowing      Basic self-care  mod I  Toileting  mod I   Bathroom Transfers mod I  Bowel/Bladder  manage bowels with medications/ manage bladder with toileting assistance  Transfers  mod I; supervision for car transfer  Locomotion  mod I for home distances; supervision for community distances  Communication     Cognition     Pain  <4 w prns  Safety/Judgment  manage safety with supervision assistance   Therapy Plan: PT Intensity: Minimum of 1-2 x/day ,45 to 90 minutes PT Frequency: 5 out of 7 days PT Duration Estimated Length of Stay: 7-10 days OT Intensity: Minimum of 1-2 x/day, 45 to 90 minutes OT Frequency: 5 out of 7 days OT Duration/Estimated Length of Stay: 7-10 days     Team Interventions: Nursing Interventions Patient/Family Education, Bladder Management, Bowel Management, Disease Management/Prevention, Pain Management, Medication Management, Skin Care/Wound Management, Discharge Planning  PT interventions Ambulation/gait training, Discharge planning, Functional  mobility training, Psychosocial support, Therapeutic Activities, Visual/perceptual remediation/compensation, Wheelchair propulsion/positioning, Therapeutic Exercise, Skin care/wound management, Neuromuscular re-education, Disease management/prevention, Metallurgist training, Cognitive remediation/compensation, DME/adaptive equipment instruction, Pain management, Splinting/orthotics, UE/LE Strength taining/ROM, UE/LE Coordination activities, Stair training, Patient/family education, Functional electrical stimulation, Community reintegration  OT Interventions Warden/ranger, Discharge planning, Functional electrical stimulation, Pain management, Therapeutic Activities, Self Care/advanced ADL retraining, UE/LE Coordination activities, Disease mangement/prevention, Functional mobility training, Patient/family education, Skin care/wound managment, Therapeutic Exercise, Visual/perceptual remediation/compensation, Wheelchair propulsion/positioning, UE/LE Strength taining/ROM, Splinting/orthotics, Psychosocial support, Neuromuscular re-education, DME/adaptive equipment instruction, Community reintegration  SLP Interventions    TR Interventions    SW/CM Interventions Discharge Planning, Psychosocial Support, Patient/Family Education, Disease Management/Prevention   Barriers to Discharge MD  Medical stability  Nursing Decreased caregiver support, Home environment access/layout, Wound Care Discharge: House  Discharge Home Layout: One level  Discharge Home Access: Level entry  PT None    OT None    SLP      SW       Team Discharge Planning: Destination: PT-Home ,OT- Home , SLP-  Projected Follow-up: PT-Home health PT, OT-  Home health OT, SLP-  Projected Equipment Needs: PT-To be determined, OT- None recommended by OT, SLP-  Equipment Details: PT- , OT-has all necessary equipment Patient/family involved in discharge planning: PT- Patient,  OT-Patient, SLP-   MD ELOS: 7-10  days Medical Rehab Prognosis:  Excellent Assessment: The patient has been admitted for CIR therapies with the 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 The team will be addressing functional mobility, strength, stamina, balance, safety, adaptive techniques and equipment, self-care,  bowel and bladder mgt, patient and caregiver education, pain mgt, wound care, community reentry. Goals have been set at mod I for mobility and self-care. Anticipated discharge destination is home with family.        See Team Conference Notes for weekly updates to the plan of care

## 2024-02-01 NOTE — Progress Notes (Addendum)
 PROGRESS NOTE   Subjective/Complaints: Pt with multiple bowel movements overnight. Apparently was a little dizzy with PT after I saw him on rounds. Staples out of left knee  ROS: Patient denies fever, rash, sore throat, blurred vision,  nausea, vomiting, diarrhea, cough, shortness of breath or chest pain,   headache, or mood change.    Objective:   No results found. Recent Labs    01/30/24 0416 01/31/24 0508  WBC 17.6* 15.2*  HGB 10.5* 10.4*  HCT 32.6* 31.9*  PLT 432* 418*   Recent Labs    01/31/24 0508 02/01/24 0601  NA 136 134*  K 4.3 4.5  CL 105 102  CO2 23 20*  GLUCOSE 136* 122*  BUN 40* 38*  CREATININE 1.42* 1.48*  CALCIUM  8.8* 9.0    Intake/Output Summary (Last 24 hours) at 02/01/2024 1021 Last data filed at 02/01/2024 0515 Gross per 24 hour  Intake 360 ml  Output 430 ml  Net -70 ml     Wound 01/30/24 1425 Pressure Injury Buttocks Right Stage 2 -  Partial thickness loss of dermis presenting as a shallow open injury with a red, pink wound bed without slough. (Active)    Physical Exam: Vital Signs Blood pressure (!) 143/67, pulse 62, temperature 97.8 F (36.6 C), temperature source Oral, resp. rate 18, height 5' 11 (1.803 m), weight 84.9 kg, SpO2 100%.  Constitutional: No distress . Vital signs reviewed. HEENT: NCAT, EOMI, oral membranes moist Neck: supple Cardiovascular: RRR without murmur. No JVD    Respiratory/Chest: CTA Bilaterally without wheezes or rales. Normal effort    GI/Abdomen: BS +, non-tender, non-distended Ext: no clubbing, cyanosis, 1+ LLE edema, trace RLE Psych: pleasant and cooperative  Skin: incision CDI with steristrips.  erythema around inferior aspect of incision. Right buttock wound Neuro:  Alert and oriented x 3. Normal insight and awareness. Intact Memory. Normal language and speech. Cranial nerve exam unremarkable. MMT: RUE 5/5. LUEsl limited proximally by pain, 5/5  distally. RLE 4 to 5/5. LLE limited by knee pain/swelling ~2/5 to 4+/5 at ankle. Sensory exam normal for light touch and pain in all 4 limbs. No limb ataxia or cerebellar signs. No abnormal tone appreciated.  .   Musculoskeletal: left knee remains swollen and tender. Passive flexion to about 90 degrees.     Assessment/Plan: 1. Functional deficits which require 3+ hours per day of interdisciplinary therapy in a comprehensive inpatient rehab setting. Physiatrist is providing close team supervision and 24 hour management of active medical problems listed below. Physiatrist and rehab team continue to assess barriers to discharge/monitor patient progress toward functional and medical goals  Care Tool:  Bathing              Bathing assist Assist Level: Supervision/Verbal cueing     Upper Body Dressing/Undressing Upper body dressing        Upper body assist Assist Level: Supervision/Verbal cueing    Lower Body Dressing/Undressing Lower body dressing            Lower body assist Assist for lower body dressing: Minimal Assistance - Patient > 75%     Toileting Toileting    Toileting assist Assist for toileting: Contact Guard/Touching  assist     Transfers Chair/bed transfer  Transfers assist     Chair/bed transfer assist level: Minimal Assistance - Patient > 75%     Locomotion Ambulation   Ambulation assist      Assist level: Minimal Assistance - Patient > 75% Assistive device: Walker-rolling Max distance: 40 ft   Walk 10 feet activity   Assist     Assist level: Minimal Assistance - Patient > 75% Assistive device: Walker-rolling   Walk 50 feet activity   Assist Walk 50 feet with 2 turns activity did not occur: Safety/medical concerns (pain/weakness/fatigue)         Walk 150 feet activity   Assist Walk 150 feet activity did not occur: Safety/medical concerns (pain/weakness/fatigue)         Walk 10 feet on uneven surface   activity   Assist Walk 10 feet on uneven surfaces activity did not occur: Safety/medical concerns (pain/weakness/fatigue)         Wheelchair     Assist Is the patient using a wheelchair?: Yes Type of Wheelchair: Manual    Wheelchair assist level: Contact Guard/Touching assist Max wheelchair distance: 75 ft    Wheelchair 50 feet with 2 turns activity    Assist        Assist Level: Contact Guard/Touching assist   Wheelchair 150 feet activity     Assist      Assist Level: Moderate Assistance - Patient 50 - 74%   Blood pressure (!) 143/67, pulse 62, temperature 97.8 F (36.6 C), temperature source Oral, resp. rate 18, height 5' 11 (1.803 m), weight 84.9 kg, SpO2 100%.  Medical Problem List and Plan: 1. 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   weightbearing as tolerated             -patient may shower             -ELOS/Goals: 8-12 days modI             -12/5 Continue CIR therapies including PT, OT    -TEDS for LE edema.  2.  Antithrombotics: -DVT/anticoagulation:  Pharmaceutical: Eliquis .  Venous Doppler studies nuclear medicine perfusion scan negative             -antiplatelet therapy: N/A   3. Pain Management: continue Robaxin  and oxycodone  as needed   4. Mood/Behavior/Sleep: Provide emotional support             -antipsychotic agents: N/A 5. Neuropsych/cognition: This patient is capable of making decisions on his own behalf.   6. Skin/Wound Care/pressure injury buttocks: WOC consulted for dressing wound care routine skin checks   -staples out. Keep incision clean and dry 7. Fluids/Electrolytes/Nutrition: Routine in and outs with follow-up chemistries   8.  Leukocytosis/HCAP.  Completing 7-day course cefepime  as well as Bactrim .  MRSA PCR negative   12/4 wbcs down to 15.2k---> recheck Monday  9.  Elevated troponin/PAF/PVCs/NSVT with history of CHB status post PPM.  Continue chronic Eliquis  as well  as Lopressor  25 mg twice daily.  Imdur  currently on hold.  Follow-up cardiology services.   Elevated troponin felt to be related to demand  ischemia as well as related to pneumonia.   10.  CAD/CABG 2006.  Followed by cardiology services   11.  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  Follow-up chemistries.  Losartan  and Jardiance  currently on hold   12/4 Cr with bump to 1.4 today. Will recheck tomorrow to make sure there is no progression.  12/5 dizzy today, Cr continues to creep up, BUN still elevated.   -will hold lasix  tomorrow  -push fluids today  -repeat BMET in am.  12.  Hyperlipidemia: continue Lipitor   13.  Constipation: continue MiraLAX  daily, Senokot -S 2 tablets nightly  12/5 -had multiple BM's last night 14. Post-op anemia/?ACD  -hgb holding steady at 10.4    LOS: 2 days A FACE TO FACE EVALUATION WAS PERFORMED  Arthea ONEIDA Gunther 02/01/2024, 10:21 AM

## 2024-02-01 NOTE — Progress Notes (Signed)
 Patient ID: William Hatfield, male   DOB: [birth year], 78 y.o.   MRN: [id number]  [facility] to reinstate [facility] services upon discharge.

## 2024-02-01 NOTE — Plan of Care (Signed)
  Problem: RH Balance Goal: LTG Patient will maintain dynamic standing with ADLs (OT) Description: LTG:  Patient will maintain dynamic standing balance with assist during activities of daily living (OT)  Flowsheets (Taken 01/31/2024 1200) LTG: Pt will maintain dynamic standing balance during ADLs with: Independent with assistive device   Problem: RH Eating Goal: LTG Patient will perform eating w/assist, cues/equip (OT) Description: LTG: Patient will perform eating with assist, with/without cues using equipment (OT) Flowsheets (Taken 01/31/2024 1200) LTG: Pt will perform eating with assistance level of: Independent with assistive device    Problem: RH Grooming Goal: LTG Patient will perform grooming w/assist,cues/equip (OT) Description: LTG: Patient will perform grooming with assist, with/without cues using equipment (OT) Flowsheets (Taken 01/31/2024 1200) LTG: Pt will perform grooming with assistance level of: Independent with assistive device    Problem: RH Bathing Goal: LTG Patient will bathe all body parts with assist levels (OT) Description: LTG: Patient will bathe all body parts with assist levels (OT) Flowsheets (Taken 01/31/2024 1200) LTG: Pt will perform bathing with assistance level/cueing: Independent with assistive device    Problem: RH Dressing Goal: LTG Patient will perform upper body dressing (OT) Description: LTG Patient will perform upper body dressing with assist, with/without cues (OT). Flowsheets (Taken 01/31/2024 1200) LTG: Pt will perform upper body dressing with assistance level of: Independent with assistive device Goal: LTG Patient will perform lower body dressing w/assist (OT) Description: LTG: Patient will perform lower body dressing with assist, with/without cues in positioning using equipment (OT) Flowsheets (Taken 01/31/2024 1200) LTG: Pt will perform lower body dressing with assistance level of: Independent with assistive device   Problem: RH Toileting Goal:  LTG Patient will perform toileting task (3/3 steps) with assistance level (OT) Description: LTG: Patient will perform toileting task (3/3 steps) with assistance level (OT)  Flowsheets (Taken 01/31/2024 1200) LTG: Pt will perform toileting task (3/3 steps) with assistance level: Independent with assistive device   Problem: RH Toilet Transfers Goal: LTG Patient will perform toilet transfers w/assist (OT) Description: LTG: Patient will perform toilet transfers with assist, with/without cues using equipment (OT) Flowsheets (Taken 01/31/2024 1200) LTG: Pt will perform toilet transfers with assistance level of: Independent with assistive device   Problem: RH Tub/Shower Transfers Goal: LTG Patient will perform tub/shower transfers w/assist (OT) Description: LTG: Patient will perform tub/shower transfers with assist, with/without cues using equipment (OT) Flowsheets (Taken 01/31/2024 1200) LTG: Pt will perform tub/shower stall transfers with assistance level of: Independent with assistive device

## 2024-02-01 NOTE — Progress Notes (Signed)
 Physical Therapy Session Note  Patient Details  Name: William Hatfield MRN: [id number] Date of Birth: 05/16/1945  Today's Date: 02/01/2024 PT Individual Time: 0917-1015, 1445-1530 PT Individual Time Calculation (min): 58 min, 45 min   Short Term Goals: Week 1:  PT Short Term Goal 1 (Week 1): STG=LTG due to ELOS  Skilled Therapeutic Interventions/Progress Updates:     Treatment 1: Pt seated in WC upon arrival. Pt reports 5/10 pain in his L knee, states he does not want pain meds and agreeable to therapy. Pt reports increased fatigue today due to being in the bathroom all night. Session emphasized functional strengthening and endurance/activity tolerance with ambulation and transfers. Pt performed sit to stand transfers to RW during session with min A overall due to fatigue. Pt amb ~40 ft and ~20 ft using RW with CGA/++ time and WC in tow. Pt required seated breaks due to report of dizziness. Assessed BP seated 146/61 (HR 59) and standing dropped to 138/50 (HR 60). PT applied TEDs B LE and notified medical team. Once back in room, pt performed short distance amb transfer using RW from [facility] to recliner with CGA/++ time. Seat alarm on and all needs within reach at end of session.  Treatment 2: Pt seated in recliner upon arrival. Pt reports 5/10 pain in his L knee but agreeable to therapy. Stated he does not want to take pain meds. Rest breaks and repositioning provided. Session emphasized functional strengthening and endurance/activity tolerance with ambulation and transfers. At start of session, seated BP 121/79, standing BP 112/53 though pt less symptomatic/dizzy upon standing this afternoon compared to AM session. Pt ambulated ~40 ft and ~50 ft during session using RW with CGA and WC in tow. Pt worked on seated heel slides and supine QS for improved AROM. Current AROM 12-76 degrees. Pt returned to sitting in recliner with B LE elevated with ice pack on L knee at end of session. Pt advised to make sure TEDs  are doffed before going to bed. Chair alarm on and all needs in reach at end of session.  Therapy Documentation Precautions:  Precautions Precautions: Fall, Knee Precaution/Restrictions Comments: Reviewed importance of terminal knee extension, elevation in extension  no pillow behind knee. Restrictions Weight Bearing Restrictions Per Provider Order: No LLE Weight Bearing Per Provider Order: Weight bearing as tolerated Other Position/Activity Restrictions: WBAT  Therapy/Group: Individual Therapy  [name], PT, DPT 02/01/2024, 7:37 AM

## 2024-02-01 NOTE — Progress Notes (Signed)
 Physical Therapy Session Note  Patient Details  Name: William Hatfield MRN: [id number] Date of Birth: [birth year]  Today's Date: 02/01/2024 PT Individual Time: 1034-1101 PT Individual Time Calculation (min): 27 min   Short Term Goals: Week 1:  PT Short Term Goal 1 (Week 1): STG=LTG due to ELOS  Skilled Therapeutic Interventions/Progress Updates:    Pt presents in recliner reporting fatigue from earlier sessions and being up all night in the bathroom. Pt also with another therapy right after this one, so deferred upright mobility for energy conservation. Pt thankful and agreeable to seated therex.   Medicine ball (4.4lbs) for core strengthening to increase overall mobility and balance and functional strength and endurance:  Bicep curls x 15 reps Trunk rotation x 15 reps x 2 sets Chop diagonals x 15 reps x 2 sets both sides (limited due to ROM in UE's and shoulder (L rotator cuff))  Seated LAQ x 15 reps for BLE strengthening, ROM, and edema management.  Adjusted LLE elevating legrest to allow for more support when seated up in w/c  with LE in extension - did not trial with pt in the chair (notified primary PT and following OT for next sessions to trial).  Left up in recliner awaiting next therapist.     Therapy Documentation Precautions:  Precautions Precautions: Fall, Knee Precaution/Restrictions Comments: Reviewed importance of terminal knee extension, elevation in extension  no pillow behind knee. Restrictions Weight Bearing Restrictions Per Provider Order: No LLE Weight Bearing Per Provider Order: Weight bearing as tolerated Other Position/Activity Restrictions: WBAT  Pain: Premedicated for pain. No complaints during session.     Therapy/Group: Individual Therapy  William Hatfield, PT, DPT, CBIS  02/01/2024, 11:56 AM

## 2024-02-01 NOTE — Progress Notes (Signed)
 Occupational Therapy Session Note  Patient Details  Name: Viyan Rosamond MRN: [id number] Date of Birth: [birth year]  Today's Date: 02/01/2024 OT Individual Time: 9269-9184 OT Individual Time Calculation (min): 45 min    Short Term Goals: Week 1:  OT Short Term Goal 1 (Week 1): STG=LTG d/t ELOS  Skilled Therapeutic Interventions/Progress Updates:   Patient agreeable to participate in OT session. Reports 5/10 pain level, refused pain medication. Patient received in bed.  Patient participated in skilled OT session focusing on functional transfer training, LE strengthening and positioning. Patient completed transfer to wc with CGA to min A. Patient completed Nustep to increase LE ROM due to tightness in flexion and extension. Following 10 minutes on level 1 for increased ROM with 2 functional rest breaks due to pain, patient completed functional mobility to mat table with min A with RW to complete sit to stand training. Able to complete sit to stands with CGA x3. Patient then completed active extension exercise to increase ROM to increase safety with ADLs and funcitonal mobility. Patient returned to room via wc set up at sink for grooming alarm on, needs met.    Therapy Documentation Precautions:  Precautions Precautions: Fall, Knee Precaution/Restrictions Comments: Reviewed importance of terminal knee extension, elevation in extension  no pillow behind knee. Restrictions Weight Bearing Restrictions Per Provider Order: No LLE Weight Bearing Per Provider Order: Weight bearing as tolerated Other Position/Activity Restrictions: WBAT  Therapy/Group: Individual Therapy  D'mariea L Gurbani Figge 02/01/2024, 7:22 AM

## 2024-02-02 DIAGNOSIS — J189 Pneumonia, unspecified organism: Secondary | ICD-10-CM | POA: Diagnosis not present

## 2024-02-02 LAB — BASIC METABOLIC PANEL WITH GFR
Anion gap: 9 (ref 5–15)
BUN: 37 mg/dL — ABNORMAL HIGH (ref 8–23)
CO2: 23 mmol/L (ref 22–32)
Calcium: 8.8 mg/dL — ABNORMAL LOW (ref 8.9–10.3)
Chloride: 102 mmol/L (ref 98–111)
Creatinine, Ser: 1.71 mg/dL — ABNORMAL HIGH (ref 0.61–1.24)
GFR, Estimated: 41 mL/min — ABNORMAL LOW (ref >60)
Glucose, Bld: 102 mg/dL — ABNORMAL HIGH (ref 70–99)
Potassium: 4.5 mmol/L (ref 3.5–5.1)
Sodium: 134 mmol/L — ABNORMAL LOW (ref 135–145)

## 2024-02-02 MED ORDER — SODIUM CHLORIDE 0.9 % IV SOLN: INTRAVENOUS | Status: AC

## 2024-02-02 NOTE — Progress Notes (Signed)
 PROGRESS NOTE   Subjective/Complaints:  No events overnight.  A.m. labs significant for worsening AKI, creatinine 1.7.  Otherwise stable. Patient c/o SOB, improving, no cough. States edema is improved Blood pressure variable.  Soft overnight, 140s this a.m.  Other vitals are stable.    02/02/2024    5:50 AM 02/01/2024    9:17 PM 02/01/2024    8:19 PM  Vitals with BMI  Systolic 146 114 885  Diastolic 65 93 93  Pulse 60 60 60    No results for input(s): GLUCAP in the last 72 hours.   P.o. intakes appropriate   Continent of bladder   Last BM 12/5, hard, medium   ROS: Patient denies fever, rash, sore throat, blurred vision,  nausea, vomiting, diarrhea, cough, shortness of breath or chest pain,   headache, or mood change.  +diarrhea/constipation +SOB - stable  Objective:   No results found. Recent Labs    01/31/24 0508  WBC 15.2*  HGB 10.4*  HCT 31.9*  PLT 418*   Recent Labs    02/01/24 0601 02/02/24 0610  NA 134* 134*  K 4.5 4.5  CL 102 102  CO2 20* 23  GLUCOSE 122* 102*  BUN 38* 37*  CREATININE 1.48* 1.71*  CALCIUM  9.0 8.8*    Intake/Output Summary (Last 24 hours) at 02/02/2024 0938 Last data filed at 02/02/2024 9157 Gross per 24 hour  Intake 236 ml  Output 1050 ml  Net -814 ml     Wound 01/30/24 1425 Pressure Injury Buttocks Right Stage 2 -  Partial thickness loss of dermis presenting as a shallow open injury with a red, pink wound bed without slough. (Active)    Physical Exam: Vital Signs Blood pressure (!) 146/65, pulse 60, temperature 97.9 F (36.6 C), resp. rate 18, height 5' 11 (1.803 m), weight 84.9 kg, SpO2 99%.  Constitutional: No distress . Vital signs reviewed. HEENT: NCAT, EOMI, oral membranes moist Neck: supple Cardiovascular: RRR without murmur. No JVD    Respiratory/Chest: CTA Bilaterally without wheezes or rales. Normal effort    GI/Abdomen: BS +, non-tender,  non-distended Ext: no clubbing, cyanosis, 1+ BL LE Psych: pleasant and cooperative  Skin: incision CDI with steristrips.  erythema around inferior aspect of incision. Right buttock wound Neuro:  Alert and oriented x 3. Normal insight and awareness. Intact Memory. Normal language and speech. Cranial nerve exam unremarkable. MMT: RUE 5/5. LUEsl limited proximally by pain, 5/5 distally. RLE 4 to 5/5. LLE limited by knee pain/swelling ~2/5 to 4+/5 at ankle. Sensory exam normal for light touch and pain in all 4 limbs.  No limb ataxia or cerebellar signs. No abnormal tone appreciated.  .   + BL UE intention tremor  Musculoskeletal: left knee remains swollen and tender. Passive flexion to about 90 degrees.     Assessment/Plan: 1. Functional deficits which require 3+ hours per day of interdisciplinary therapy in a comprehensive inpatient rehab setting. Physiatrist is providing close team supervision and 24 hour management of active medical problems listed below. Physiatrist and rehab team continue to assess barriers to discharge/monitor patient progress toward functional and medical goals  Care Tool:  Bathing  Bathing assist Assist Level: Supervision/Verbal cueing     Upper Body Dressing/Undressing Upper body dressing        Upper body assist Assist Level: Supervision/Verbal cueing    Lower Body Dressing/Undressing Lower body dressing            Lower body assist Assist for lower body dressing: Minimal Assistance - Patient > 75%     Toileting Toileting    Toileting assist Assist for toileting: Contact Guard/Touching assist     Transfers Chair/bed transfer  Transfers assist     Chair/bed transfer assist level: Minimal Assistance - Patient > 75%     Locomotion Ambulation   Ambulation assist      Assist level: Minimal Assistance - Patient > 75% Assistive device: Walker-rolling Max distance: 40 ft   Walk 10 feet activity   Assist     Assist  level: Minimal Assistance - Patient > 75% Assistive device: Walker-rolling   Walk 50 feet activity   Assist Walk 50 feet with 2 turns activity did not occur: Safety/medical concerns (pain/weakness/fatigue)         Walk 150 feet activity   Assist Walk 150 feet activity did not occur: Safety/medical concerns (pain/weakness/fatigue)         Walk 10 feet on uneven surface  activity   Assist Walk 10 feet on uneven surfaces activity did not occur: Safety/medical concerns (pain/weakness/fatigue)         Wheelchair     Assist Is the patient using a wheelchair?: Yes Type of Wheelchair: Manual    Wheelchair assist level: Contact Guard/Touching assist Max wheelchair distance: 75 ft    Wheelchair 50 feet with 2 turns activity    Assist        Assist Level: Contact Guard/Touching assist   Wheelchair 150 feet activity     Assist      Assist Level: Moderate Assistance - Patient 50 - 74%   Blood pressure (!) 146/65, pulse 60, temperature 97.9 F (36.6 C), resp. rate 18, height 5' 11 (1.803 m), weight 84.9 kg, SpO2 99%.  Medical Problem List and Plan: 1. 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   weightbearing as tolerated             -patient may shower             -ELOS/Goals: 8-12 days modI             -12/5 Continue CIR therapies including PT, OT    -TEDS for LE edema.  2.  Antithrombotics: -DVT/anticoagulation:  Pharmaceutical: Eliquis .  Venous Doppler studies nuclear medicine perfusion scan negative             -antiplatelet therapy: N/A   3. Pain Management: continue Robaxin  and oxycodone  as needed   4. Mood/Behavior/Sleep: Provide emotional support             -antipsychotic agents: N/A 5. Neuropsych/cognition: This patient is capable of making decisions on his own behalf.   6. Skin/Wound Care/pressure injury buttocks: WOC consulted for dressing wound care routine skin checks   -staples  out. Keep incision clean and dry  7. Fluids/Electrolytes/Nutrition: Routine in and outs with follow-up chemistries  12/6: ?why on fluid restriction - defer to primary team   8.  Leukocytosis/HCAP.  Completing 7-day course cefepime  as well as Bactrim .  MRSA PCR negative   12/4 wbcs down to 15.2k---> recheck Monday   9.  Elevated troponin/PAF/PVCs/NSVT with history of CHB status post PPM.  Continue chronic Eliquis  as well as Lopressor  25 mg twice daily.  Imdur  currently on hold.  Follow-up cardiology services.   Elevated troponin felt to be related to demand  ischemia as well as related to pneumonia.   10.  CAD/CABG 2006.CHF.  Followed by cardiology services    - EF 50-55%  12/6: Monitor vitals with IVF, consider CXR in AM if any desats/sob  11.  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  Follow-up chemistries.  Losartan  and Jardiance  currently on hold   12/4 Cr with bump to 1.4 today. Will recheck tomorrow to make sure there is no progression. 12/5 dizzy today, Cr continues to creep up, BUN still elevated.   -will hold lasix  tomorrow  -push fluids today  -repeat BMET in am.  12/6: Creatinine up to 1.71, BUN 37; PO fludis very poor; will give IV fluids through today and 75 cc/h and repeat in AM.   12.  Hyperlipidemia: continue Lipitor   13.  Constipation: continue MiraLAX  daily, Senokot -S 2 tablets nightly  12/5 -had multiple BM's last night  LBM 12/5, hard  14. Post-op anemia/?ACD  -hgb holding steady at 10.4    LOS: 3 days A FACE TO FACE EVALUATION WAS PERFORMED  William Hatfield Likes 02/02/2024, 9:38 AM

## 2024-02-02 NOTE — Progress Notes (Signed)
 Physical Therapy Session Note  Patient Details  Name: William Hatfield MRN: [id number] Date of Birth: 05-Aug-1945  Today's Date: 02/02/2024 PT Individual Time: 0800-0855, 1415-1530 PT Individual Time Calculation (min): 55 min, 75 min   Short Term Goals: Week 1:  PT Short Term Goal 1 (Week 1): STG=LTG due to ELOS  Skilled Therapeutic Interventions/Progress Updates:     Treatment 1: Pt seated EOB upon arrival. Pt reports 3/10 pain in his L knee and agreeable to therapy. Rest breaks and repositioning provided. Nursing administered meds during session. Session emphasized functional strengthening, endurance/activity tolerance as well as improved L knee ROM for carryonver into gait and functional mobility. Pt utilized NuStep focusing on L knee ROM, strengthening, and endurance - L3 x 15 min, seat set at 11 and pt encouraged to use as much knee ROM as he can tolerate, 421 steps, and 23 avg steps per min. Pt worked on sit to stands with L LE bias - PT manually blocking R foot from sliding back. Mod A initially progressing to CGA. VC for reaching back with single UE during descent for improved control. Pt very receptive of cueing. Pt performed short distance transfers from bed/mat/Nustep using RW throughout session with CGA and ++ time. Pt requires min/mod A to control descent when sitting to WC due to lower height. Once back in room, pt remained seated in [facility] requesting to use bathroom - PT notified NT. All needs within reach at end of session.  Treatment 2: Pt seated in recliner upon arrival. Pt reports 3/10 pain in L knee. Agreeable to therapy. Rest breaks and repositioning provided. Session emphasized functional strengthening, endurance/activity tolerance as well as improved L knee ROM for carryonver into gait and functional mobility. Pt amb ~60 ft using RW with CGA and +2 for IV pole management. Pt transported remainder of distance dependent in [facility] for time/energy/pain management. Pt performed stand pivot  transfer WC <> mat using RW with CGA and VC. Pt performed 3x12 supine quad set L LE with rolled washcloth under knee for comfort. Pt performed 3x12 supine heel slides L LE. Pt performed 1x10 mini squats to tolerance with B UE support at side of  bars. PT passively stretched pt's L knee into extension x5 min for improved ROM for carryover into gait. Rest breaks required throughout due to pain/fatigue. VC required to breathe throughout session. L knee AROM 12-80 deg today. Pt performed short distance amb transfer back to recliner from [facility] using RW with CGA. Chair alarm on and all needs in reach at end of session.  Therapy Documentation Precautions:  Precautions Precautions: Fall, Knee Precaution/Restrictions Comments: Reviewed importance of terminal knee extension, elevation in extension  no pillow behind knee. Restrictions Weight Bearing Restrictions Per Provider Order: No LLE Weight Bearing Per Provider Order: Weight bearing as tolerated Other Position/Activity Restrictions: WBAT  Therapy/Group: Individual Therapy  [name], PT, DPT 02/02/2024, 7:24 AM

## 2024-02-02 NOTE — Progress Notes (Signed)
 Occupational Therapy Session Note  Patient Details  Name: William Hatfield MRN: [id number] Date of Birth: 06-28-1945  Today's Date: 02/02/2024 OT Individual Time: 1020-1145 OT Individual Time Calculation (min): 85 min    Short Term Goals: Week 1:  OT Short Term Goal 1 (Week 1): STG=LTG d/t ELOS  Skilled Therapeutic Interventions/Progress Updates:   Patient agreeable to participate in OT session. Reports 8/10 pain level. Patient participated in skilled OT session focusing on bathing, dressing, UE strengthening. Patient received in recliner ready for OT. Patient set up for gathering of clothing. CGA for functional mobility to shower. Patient able to shower with CGA to SUP A with shower chair. Completed clothing doffing with CGA to min A in standing, donning  of clothes at EOB with min A for threading LE. Patient educated on use of reacher for LB dressing. Patient then completed wound care of sacrum due to skin breakdown area. Patient able to complete UB dressing SU assist. Patient mod A for sock donning due to decreased ROM. Patient then completed transfer back to wc for UE strengthening. Patient completed UE strengthening circuit consisting of elbow flexion, elbow extension, scapular rows, horizontal abduction, IR, and ER to increase shoulder stability, UE strength related to ADL participation and functional mobility. Patient completed all strengthening with [name] and blue theraband 2x15. RUE ER completed Non resisted due to rotator cuff injury prior to admission. Patient then returned to recliner with alarm on all needs in reach, set up for lunch.      Therapy Documentation Precautions:  Precautions Precautions: Fall, Knee Precaution/Restrictions Comments: Reviewed importance of terminal knee extension, elevation in extension  no pillow behind knee. Restrictions Weight Bearing Restrictions Per Provider Order: No LLE Weight Bearing Per Provider Order: Weight bearing as tolerated Other  Position/Activity Restrictions: WBAT    Therapy/Group: Individual Therapy  D'mariea L Savilla Turbyfill 02/02/2024, 10:08 AM

## 2024-02-02 NOTE — Plan of Care (Signed)
  Problem: Consults Goal: RH GENERAL PATIENT EDUCATION Description: See Patient Education module for education specifics. Outcome: Progressing   Problem: RH SKIN INTEGRITY Goal: RH STG SKIN FREE OF INFECTION/BREAKDOWN Description: Manage skin free of infection with supervision assistance Outcome: Progressing

## 2024-02-03 DIAGNOSIS — J189 Pneumonia, unspecified organism: Secondary | ICD-10-CM | POA: Diagnosis not present

## 2024-02-03 LAB — BASIC METABOLIC PANEL WITH GFR
Anion gap: 6 (ref 5–15)
BUN: 31 mg/dL — ABNORMAL HIGH (ref 8–23)
CO2: 22 mmol/L (ref 22–32)
Calcium: 8.7 mg/dL — ABNORMAL LOW (ref 8.9–10.3)
Chloride: 107 mmol/L (ref 98–111)
Creatinine, Ser: 1.61 mg/dL — ABNORMAL HIGH (ref 0.61–1.24)
GFR, Estimated: 44 mL/min — ABNORMAL LOW (ref >60)
Glucose, Bld: 110 mg/dL — ABNORMAL HIGH (ref 70–99)
Potassium: 4.2 mmol/L (ref 3.5–5.1)
Sodium: 135 mmol/L (ref 135–145)

## 2024-02-03 LAB — CULTURE, BLOOD (ROUTINE X 2)
Culture: NO GROWTH
Culture: NO GROWTH
Special Requests: ADEQUATE
Special Requests: ADEQUATE

## 2024-02-03 MED ORDER — ORAL CARE MOUTH RINSE: 15.0000 mL | OROMUCOSAL | Status: DC | PRN

## 2024-02-03 NOTE — Progress Notes (Signed)
 PROGRESS NOTE   Subjective/Complaints:  No events overnight.  BUN/creatinine slightly better today, from 37/1.71 to 31/1.61.  Unfortunately, patient has developed significant edema in the bilateral legs with IV fluids.  Shortness of breath, no other complaints.  Family at bedside, all questions answered.  ROS: Patient denies fever, rash, sore throat, blurred vision,  nausea, vomiting, diarrhea, cough, shortness of breath or chest pain,   headache, or mood change.  +diarrhea/constipation +SOB -no complaints  Objective:   No results found. No results for input(s): WBC, HGB, HCT, PLT in the last 72 hours.  Recent Labs    02/02/24 0610 02/03/24 0714  NA 134* 135  K 4.5 4.2  CL 102 107  CO2 23 22  GLUCOSE 102* 110*  BUN 37* 31*  CREATININE 1.71* 1.61*  CALCIUM  8.8* 8.7*    Intake/Output Summary (Last 24 hours) at 02/03/2024 1357 Last data filed at 02/03/2024 1034 Gross per 24 hour  Intake 118 ml  Output 450 ml  Net -332 ml     Wound 01/30/24 1425 Pressure Injury Buttocks Right Stage 2 -  Partial thickness loss of dermis presenting as a shallow open injury with a red, pink wound bed without slough. (Active)    Physical Exam: Vital Signs Blood pressure 114/86, pulse 60, temperature 97.6 F (36.4 C), resp. rate 18, height 5' 11 (1.803 m), weight 84.9 kg, SpO2 99%.  Constitutional: No distress . Vital signs reviewed.  Laying in bed. HEENT: NCAT, EOMI, oral membranes moist Neck: supple Cardiovascular: RRR without murmur. No JVD    Respiratory/Chest: CTA Bilaterally without wheezes or rales. Normal effort    GI/Abdomen: BS +, non-tender, non-distended Ext: no clubbing, cyanosis, 2+ bilateral lower extremity edema Psych: pleasant and cooperative  Skin: Visible surfaces intact  incision CDI with steristrips.  erythema around inferior aspect of incision. Right buttock wound   Neuro:  Alert and oriented x 3.  Normal insight and awareness. Intact Memory. Normal language and speech. Cranial nerve exam unremarkable.  MMT: Bilateral upper extremities 5 out of 5. RLE 4 to 5/5. LLE 3-4/5 . Sensory exam normal for light touch and pain in all 4 limbs.  No limb ataxia or cerebellar signs. No abnormal tone appreciated.  .   + BL UE intention tremor  Musculoskeletal: Passive flexion to about 90 degrees.  Left knee swelling present.   Assessment/Plan: 1. Functional deficits which require 3+ hours per day of interdisciplinary therapy in a comprehensive inpatient rehab setting. Physiatrist is providing close team supervision and 24 hour management of active medical problems listed below. Physiatrist and rehab team continue to assess barriers to discharge/monitor patient progress toward functional and medical goals  Care Tool:  Bathing              Bathing assist Assist Level: Supervision/Verbal cueing     Upper Body Dressing/Undressing Upper body dressing        Upper body assist Assist Level: Supervision/Verbal cueing    Lower Body Dressing/Undressing Lower body dressing            Lower body assist Assist for lower body dressing: Minimal Assistance - Patient > 75%     Toileting Toileting  Toileting assist Assist for toileting: Contact Guard/Touching assist     Transfers Chair/bed transfer  Transfers assist     Chair/bed transfer assist level: Minimal Assistance - Patient > 75%     Locomotion Ambulation   Ambulation assist      Assist level: Minimal Assistance - Patient > 75% Assistive device: Walker-rolling Max distance: 40 ft   Walk 10 feet activity   Assist     Assist level: Minimal Assistance - Patient > 75% Assistive device: Walker-rolling   Walk 50 feet activity   Assist Walk 50 feet with 2 turns activity did not occur: Safety/medical concerns (pain/weakness/fatigue)         Walk 150 feet activity   Assist Walk 150 feet activity did not  occur: Safety/medical concerns (pain/weakness/fatigue)         Walk 10 feet on uneven surface  activity   Assist Walk 10 feet on uneven surfaces activity did not occur: Safety/medical concerns (pain/weakness/fatigue)         Wheelchair     Assist Is the patient using a wheelchair?: Yes Type of Wheelchair: Manual    Wheelchair assist level: Contact Guard/Touching assist Max wheelchair distance: 75 ft    Wheelchair 50 feet with 2 turns activity    Assist        Assist Level: Contact Guard/Touching assist   Wheelchair 150 feet activity     Assist      Assist Level: Moderate Assistance - Patient 50 - 74%   Blood pressure 114/86, pulse 60, temperature 97.6 F (36.4 C), resp. rate 18, height 5' 11 (1.803 m), weight 84.9 kg, SpO2 99%.  Medical Problem List and Plan: 1. 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   weightbearing as tolerated             -patient may shower             -ELOS/Goals: 8-12 days modI             -12/5 Continue CIR therapies including PT, OT   -TEDS for LE edema.   2.  Antithrombotics: -DVT/anticoagulation:  Pharmaceutical: Eliquis .  Venous Doppler studies nuclear medicine perfusion scan negative             -antiplatelet therapy: N/A   3. Pain Management: continue Robaxin  and oxycodone  as needed   4. Mood/Behavior/Sleep: Provide emotional support             -antipsychotic agents: N/A 5. Neuropsych/cognition: This patient is capable of making decisions on his own behalf.   6. Skin/Wound Care/pressure injury buttocks: WOC consulted for dressing wound care routine skin checks   -staples out. Keep incision clean and dry  7. Fluids/Electrolytes/Nutrition: Routine in and outs with follow-up chemistries  12/6: ?why on fluid restriction - defer to primary team   8.  Leukocytosis/HCAP.  Completing 7-day course cefepime  as well as Bactrim .  MRSA PCR negative   12/4 wbcs down to  15.2k---> recheck Monday   9.  Elevated troponin/PAF/PVCs/NSVT with history of CHB status post PPM.  Continue chronic Eliquis  as well as Lopressor  25 mg twice daily.  Imdur  currently on hold.  Follow-up cardiology services.   Elevated troponin felt to be related to demand  ischemia as well as related to pneumonia.   10.  CAD/CABG 2006.CHF.  Followed by cardiology services    - EF 50-55%  12/6: Monitor vitals with IVF, consider CXR in AM if any desats/sob  12-7: Fluid resuscitation limited by  increasing edema.  Appears Lasix  40 milligrams was resumed this morning, will DC for tomorrow, may just be overdiuresed and need a few days before resuming/adjusting dose.  Courage patient to wear TED hose  11.  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  Follow-up chemistries.  Losartan  and Jardiance  currently on hold   12/4 Cr with bump to 1.4 today. Will recheck tomorrow to make sure there is no progression. 12/5 dizzy today, Cr continues to creep up, BUN still elevated.   -will hold lasix  tomorrow  -push fluids today  -repeat BMET in am.  12/6: Creatinine up to 1.71, BUN 37; PO fluids very poor; will give IV fluids through today and 75 cc/h and repeat in AM.  12-7: BUN/creatinine slightly improved to 1.6, however significant peripheral edema with IV fluid.  Will likely increase again tomorrow with Lasix , hold a.m. dose Lasix , encourage p.o. fluids.  12.  Hyperlipidemia: continue Lipitor   13.  Constipation: continue MiraLAX  daily, Senokot -S 2 tablets nightly  12/5 -had multiple BM's last night  LBM 12/5, hard  14. Post-op anemia/?ACD  -hgb holding steady at 10.4    LOS: 4 days A FACE TO FACE EVALUATION WAS PERFORMED  William Hatfield Likes 02/03/2024, 1:57 PM

## 2024-02-03 NOTE — Plan of Care (Signed)
  Problem: RH BLADDER ELIMINATION Goal: RH STG MANAGE BLADDER WITH ASSISTANCE Description: STG Manage Bladder With supervision Assistance Outcome: Progressing   Problem: RH PAIN MANAGEMENT Goal: RH STG PAIN MANAGED AT OR BELOW PT'S PAIN GOAL Description: <4 w/ prns Outcome: Progressing   Problem: RH KNOWLEDGE DEFICIT GENERAL Goal: RH STG INCREASE KNOWLEDGE OF SELF CARE AFTER HOSPITALIZATION Description: Manage increase knowledge of self care after hospitalization with supervision assistance from daughter using educational materials provided Outcome: Progressing

## 2024-02-04 LAB — CBC
HCT: 32.5 % — ABNORMAL LOW (ref 39.0–52.0)
Hemoglobin: 10.6 g/dL — ABNORMAL LOW (ref 13.0–17.0)
MCH: 29.6 pg (ref 26.0–34.0)
MCHC: 32.6 g/dL (ref 30.0–36.0)
MCV: 90.8 fL (ref 80.0–100.0)
Platelets: 368 K/uL (ref 150–400)
RBC: 3.58 MIL/uL — ABNORMAL LOW (ref 4.22–5.81)
RDW: 15.5 % (ref 11.5–15.5)
WBC: 12.5 K/uL — ABNORMAL HIGH (ref 4.0–10.5)
nRBC: 0 % (ref 0.0–0.2)

## 2024-02-04 LAB — BASIC METABOLIC PANEL WITH GFR
Anion gap: 10 (ref 5–15)
BUN: 30 mg/dL — ABNORMAL HIGH (ref 8–23)
CO2: 20 mmol/L — ABNORMAL LOW (ref 22–32)
Calcium: 8.9 mg/dL (ref 8.9–10.3)
Chloride: 105 mmol/L (ref 98–111)
Creatinine, Ser: 1.59 mg/dL — ABNORMAL HIGH (ref 0.61–1.24)
GFR, Estimated: 44 mL/min — ABNORMAL LOW (ref >60)
Glucose, Bld: 100 mg/dL — ABNORMAL HIGH (ref 70–99)
Potassium: 4.7 mmol/L (ref 3.5–5.1)
Sodium: 135 mmol/L (ref 135–145)

## 2024-02-04 MED ORDER — MELATONIN 3 MG PO TABS
3.0000 mg | ORAL_TABLET | Freq: Every day | ORAL | Status: DC
  Administered 2024-02-04: 3 mg via ORAL
  Filled 2024-02-04 (×5): qty 1

## 2024-02-04 NOTE — Progress Notes (Signed)
 Physical Therapy Session Note  Patient Details  Name: William Hatfield MRN: [id number] Date of Birth: 06-29-1945  Today's Date: 02/04/2024 PT Individual Time: 1015-1126 PT Individual Time Calculation (min): 71 min   Short Term Goals: Week 1:  PT Short Term Goal 1 (Week 1): STG=LTG due to ELOS  Skilled Therapeutic Interventions/Progress Updates:    Pt presents up in w/c. Pt working on [employer] for LLE knee flexion and extension using gait belt to assist seated in w/c while discussing overall plan and progress. Functional gait training with RW with overall CGA for strengthening, endurance, and ROM x 75'. Pt transported rest of the way to gym for time management and energy conservation.Transferred to mat table with CGA overall with RW and then in supine to work on LLE ROM and strengthening (CGA to achieve position) Pt performed  active assisted heel slides with quad set at end with 5-10 second hold - performed about 10 reps before increased pain in L hip started. Attempted to modify but pt felt like it was cramping and required to return to seated position with min A for trunk.  Transitioned to NMR activities to address standing balance and tolerance activity while performing functional reaching task with RW for support - combo of 1 or no UE support to pitch horseshoes x 3 trials each with CGA for balance and cues to increased WB to LLE in stance. Dynamic gait through obstacle course for home environment and balance retraining x 30' x 2 trials with seated break between each set. Pt reports SOB, sats = 98-100% and HR = 58-63 bpm when taken after each trial.  Returned to room via w/c total A and all needs in reach.   Therapy Documentation Precautions:  Precautions Precautions: Fall, Knee Precaution/Restrictions Comments: Reviewed importance of terminal knee extension, elevation in extension  no pillow behind knee. Restrictions Weight Bearing Restrictions Per Provider Order: No LLE Weight Bearing Per  Provider Order: Weight bearing as tolerated Other Position/Activity Restrictions: WBAT    Pain:  Reports 3-4/10 pain in LLE - declines medication at this time. Also discussed use of ice for pain and edema management.    Therapy/Group: Individual Therapy  William Hatfield, PT, DPT, CBIS  02/04/2024, 12:08 PM

## 2024-02-04 NOTE — Progress Notes (Signed)
 PROGRESS NOTE   Subjective/Complaints: No acute events overnight.  Reports poor sleep at night, attributes it to being in the hospital and environmental change.  Knee pain overall under control  ROS: Patient denies fever, rash, sore throat, blurred vision,  nausea, vomiting, diarrhea, cough, shortness of breath or chest pain,   headache, or mood change.  +diarrhea/constipation +SOB -no complaints + Indigestion  Objective:   No results found. Recent Labs    02/04/24 0621  WBC 12.5*  HGB 10.6*  HCT 32.5*  PLT 368    Recent Labs    02/03/24 0714 02/04/24 0621  NA 135 135  K 4.2 4.7  CL 107 105  CO2 22 20*  GLUCOSE 110* 100*  BUN 31* 30*  CREATININE 1.61* 1.59*  CALCIUM  8.7* 8.9    Intake/Output Summary (Last 24 hours) at 02/04/2024 1113 Last data filed at 02/04/2024 0853 Gross per 24 hour  Intake 360 ml  Output 125 ml  Net 235 ml     Wound 01/30/24 1425 Pressure Injury Buttocks Right Stage 2 -  Partial thickness loss of dermis presenting as a shallow open injury with a red, pink wound bed without slough. (Active)    Physical Exam: Vital Signs Blood pressure (!) 141/64, pulse (!) 59, temperature 98.3 F (36.8 C), resp. rate 18, height 5' 11 (1.803 m), weight 84.9 kg, SpO2 97%.  Constitutional: No distress . Vital signs reviewed.  Laying in bed. HEENT: NCAT, EOMI, oral membranes moist Neck: supple Cardiovascular: RRR without murmur. No JVD    Respiratory/Chest: CTA Bilaterally without wheezes or rales. Normal effort    GI/Abdomen: BS +, non-tender, non-distended Ext: no clubbing, cyanosis, 2+ bilateral lower extremity edema Psych: pleasant and cooperative  Skin: Visible surfaces intact  incision CDI with steristrips.  erythema around inferior aspect of incision. Right buttock wound   Neuro:  Alert and oriented x 3. Normal insight and awareness. Intact Memory. Normal language and speech. Cranial nerve  exam unremarkable.  MMT: Bilateral upper extremities 5 out of 5. RLE 4 to 5/5. LLE 3-4/5 . Sensory exam normal for light touch and pain in all 4 limbs.  No limb ataxia or cerebellar signs. No abnormal tone appreciated.  .   + BL UE intention tremor  Prior neuro assessment is c/w today's exam 02/04/2024.   Musculoskeletal: Passive flexion to about 90 degrees.  Left knee swelling present.   Assessment/Plan: 1. Functional deficits which require 3+ hours per day of interdisciplinary therapy in a comprehensive inpatient rehab setting. Physiatrist is providing close team supervision and 24 hour management of active medical problems listed below. Physiatrist and rehab team continue to assess barriers to discharge/monitor patient progress toward functional and medical goals  Care Tool:  Bathing              Bathing assist Assist Level: Supervision/Verbal cueing     Upper Body Dressing/Undressing Upper body dressing        Upper body assist Assist Level: Supervision/Verbal cueing    Lower Body Dressing/Undressing Lower body dressing            Lower body assist Assist for lower body dressing: Minimal Assistance - Patient > 75%  Toileting Toileting    Toileting assist Assist for toileting: Contact Guard/Touching assist     Transfers Chair/bed transfer  Transfers assist     Chair/bed transfer assist level: Minimal Assistance - Patient > 75%     Locomotion Ambulation   Ambulation assist      Assist level: Minimal Assistance - Patient > 75% Assistive device: Walker-rolling Max distance: 40 ft   Walk 10 feet activity   Assist     Assist level: Minimal Assistance - Patient > 75% Assistive device: Walker-rolling   Walk 50 feet activity   Assist Walk 50 feet with 2 turns activity did not occur: Safety/medical concerns (pain/weakness/fatigue)         Walk 150 feet activity   Assist Walk 150 feet activity did not occur: Safety/medical  concerns (pain/weakness/fatigue)         Walk 10 feet on uneven surface  activity   Assist Walk 10 feet on uneven surfaces activity did not occur: Safety/medical concerns (pain/weakness/fatigue)         Wheelchair     Assist Is the patient using a wheelchair?: Yes Type of Wheelchair: Manual    Wheelchair assist level: Contact Guard/Touching assist Max wheelchair distance: 75 ft    Wheelchair 50 feet with 2 turns activity    Assist        Assist Level: Contact Guard/Touching assist   Wheelchair 150 feet activity     Assist      Assist Level: Moderate Assistance - Patient 50 - 74%   Blood pressure (!) 141/64, pulse (!) 59, temperature 98.3 F (36.8 C), resp. rate 18, height 5' 11 (1.803 m), weight 84.9 kg, SpO2 97%.  Medical Problem List and Plan: 1. 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   weightbearing as tolerated             -patient may shower             -ELOS/Goals: 8-12 days modI             -12/5 Continue CIR therapies including PT, OT   -TEDS for LE edema.   2.  Antithrombotics: -DVT/anticoagulation:  Pharmaceutical: Eliquis .  Venous Doppler studies nuclear medicine perfusion scan negative             -antiplatelet therapy: N/A   3. Pain Management: continue Robaxin  and oxycodone  as needed   4. Mood/Behavior/Sleep: Provide emotional support             -antipsychotic agents: N/A  - 12/8 melatonin for insomnia 5. Neuropsych/cognition: This patient is capable of making decisions on his own behalf.   6. Skin/Wound Care/pressure injury buttocks: WOC consulted for dressing wound care routine skin checks   -staples out. Keep incision clean and dry  7. Fluids/Electrolytes/Nutrition: Routine in and outs with follow-up chemistries  12/6: ?why on fluid restriction - defer to primary team   8.  Leukocytosis/HCAP.  Completing 7-day course cefepime  as well as Bactrim .  MRSA PCR negative    12/4 wbcs down to 15.2k---> recheck Monday   12/8 WBC down to 12.5 improved  9.  Elevated troponin/PAF/PVCs/NSVT with history of CHB status post PPM.  Continue chronic Eliquis  as well as Lopressor  25 mg twice daily.  Imdur  currently on hold.  Follow-up cardiology services.   Elevated troponin felt to be related to demand  ischemia as well as related to pneumonia.   10.  CAD/CABG 2006.CHF.  Followed by cardiology services    - EF  50-55%  12/6: Monitor vitals with IVF, consider CXR in AM if any desats/sob  12-7: Fluid resuscitation limited by increasing edema.  Appears Lasix  40 milligrams was resumed this morning, will DC for tomorrow, may just be overdiuresed and need a few days before resuming/adjusting dose.  Courage patient to wear TED hose  11.  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  Follow-up chemistries.  Losartan  and Jardiance  currently on hold   12/4 Cr with bump to 1.4 today. Will recheck tomorrow to make sure there is no progression. 12/5 dizzy today, Cr continues to creep up, BUN still elevated.   -will hold lasix  tomorrow  -push fluids today  -repeat BMET in am.  12/6: Creatinine up to 1.71, BUN 37; PO fluids very poor; will give IV fluids through today and 75 cc/h and repeat in AM.  12-7: BUN/creatinine slightly improved to 1.6, however significant peripheral edema with IV fluid.  Will likely increase again tomorrow with Lasix , hold a.m. dose Lasix , encourage p.o. fluids. 12-8 BUN and creatinine 30/1.59, continue current regimen for now  12.  Hyperlipidemia: continue Lipitor   13.  Constipation: continue MiraLAX  daily, Senokot -S 2 tablets nightly  12/5 -had multiple BM's last night  LBM 12/5, hard  11/8 LBM yesterday continue current regimen and monitor  14. Post-op anemia/?ACD  -12/8 hemoglobin stable at 10.6  15.  Indigestion   - Continue PPI twice daily, Maalox available as needed  LOS: 5 days A FACE TO FACE EVALUATION WAS PERFORMED  Murray Collier 02/04/2024, 11:13 AM

## 2024-02-04 NOTE — Progress Notes (Signed)
 Occupational Therapy Session Note  Patient Details  Name: William Hatfield MRN: [id number] Date of Birth: 1945-11-08  Today's Date: 02/04/2024 OT Individual Time: 9099-9041 OT Individual Time Calculation (min): 58 min    Skilled Therapeutic Interventions/Progress Updates: Patient received sitting EOB with nursing present. Patient motivated to get up with OT. Patient wanting to get in the shower this morning. Ambulated to shower SBA with LLE in flexed position using RW. Patient reports still not having full extension. See below for full ADL levels. Patient ambulated to w/c with SBA using RW. Sat at sink for grooming tasks. 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 Continue with skilled OT POC.      Therapy Documentation Precautions:  Precautions Precautions: Fall, Knee Precaution/Restrictions Comments: Reviewed importance of terminal knee extension, elevation in extension  no pillow behind knee. Restrictions Weight Bearing Restrictions Per Provider Order: No LLE Weight Bearing Per Provider Order: Weight bearing as tolerated Other Position/Activity Restrictions: WBAT General:   Vital Signs: Therapy Vitals Temp: 98.3 F (36.8 C) Pulse Rate: (!) 59 Resp: 18 BP: (!) 141/64 Patient Position (if appropriate): Lying Oxygen Therapy SpO2: 97 % O2 Device: Room Air Pain:4/10   ADL: ADL Equipment Provided: Long-handled sponge Eating: Independent Where Assessed-Eating: EOB Grooming: Setup Where Assessed-Grooming: Sitting at sink Upper Body Bathing: Supervision/safety Where Assessed-Upper Body Bathing: Shower Lower Body Bathing: Supervision/safety Where Assessed-Lower Body Bathing: Shower Upper Body Dressing: Supervision/safety Where Assessed-Upper Body Dressing: Edge of bed Lower Body Dressing: Minimal assistance with socks only SBA with shorts Where Assessed-Lower Body Dressing: EOB Toileting: SBA Where Assessed-Toileting: Public House Manager: Minimal assistance (standard low toilet, CGA BSC transfer) Toilet Transfer Method: Proofreader: Grab bars, Bedside commode Tub/Shower Transfer: Not assessed Film/video Editor: SBA Film/video Editor Method: Designer, Industrial/product: Emergency planning/management officer, Grab bars ADL Comments: increased time for ADL tasks, pt would benefit from use of reacher for LB dressing    Therapy/Group: Individual Therapy  Isaiah JONETTA Freund 02/04/2024, 12:31 PM

## 2024-02-04 NOTE — Progress Notes (Signed)
 Physical Therapy Session Note  Patient Details  Name: Nasario Czerniak MRN: [id number] Date of Birth: Oct 14, 1945  Today's Date: 02/04/2024 PT Individual Time: 1300-1415 PT Individual Time Calculation (min): 75 min   Short Term Goals: Week 1:  PT Short Term Goal 1 (Week 1): STG=LTG due to ELOS  Skilled Therapeutic Interventions/Progress Updates: Pt presents sitting in w/c and agreeable to therapy.  Pt wheeled to small gym and then transfers sit to stand w/ CGA and cues for hand placement as well as increasing forward lean to increase L knee flexion.  Pt tolerated Nu-step at Level 1 for increased ROM to L knee.  Pt initially set at seat position of 11 and then moved to 9 to increase knee flexion.  Pt cued for L LE emphasis to increase strength and ROM, performing 469 steps and averaged 35 spm, 1.4 METS for 13 minutes.  Pt requires seated rest breaks 2/2 fatigue and pain management.  Pt performed marching 2 x 10 w/ cues for WB through LLE, but knee remains flexed  w/ WB.  Pt performed TKE w/ LLE on 2 platform 2 x 10.  Pt amb x 100' and 90' w/ RW and CGA, cues for reciprocal gait and improving ER on L.  Pt states improved range L knee w/ activity.  Pt educated on mobility at home especially avoiding sitting for long periods of time and also ROM to LLE to decrease edema.  Pt amb to room and sat EOB.  Pt then states need for BR.  Pt amb w/ RW and CGA into BR, superviion for clothing management and sat on toilet w/ CGA.  Pt will pull call alram bell when finished, nursing notified of position.     Therapy Documentation Precautions:  Precautions Precautions: Fall, Knee Precaution/Restrictions Comments: Reviewed importance of terminal knee extension, elevation in extension  no pillow behind knee. Restrictions Weight Bearing Restrictions Per Provider Order: No LLE Weight Bearing Per Provider Order: Weight bearing as tolerated Other Position/Activity Restrictions: WBAT General:   Vital Signs: Therapy  Vitals Temp: 97.6 F (36.4 C) Temp Source: Oral Pulse Rate: 61 Resp: 17 BP: (!) 130/55 Patient Position (if appropriate): Sitting Oxygen Therapy SpO2: 100 % O2 Device: Room Air Pain:4/10 initially, but increased to 6/10 w/ activity.      Therapy/Group: Individual Therapy  Ahmari Duerson P Bexton Haak 02/04/2024, 2:21 PM

## 2024-02-05 MED ORDER — TRAZODONE HCL 50 MG PO TABS: 50.0000 mg | ORAL_TABLET | Freq: Every evening | ORAL | Status: DC | PRN

## 2024-02-05 NOTE — Progress Notes (Signed)
 Physical Therapy Session Note  Patient Details  Name: William Hatfield MRN: [id number] Date of Birth: 1946-02-10  Today's Date: 02/05/2024 PT Individual Time: 8954-8841, 8699-8642 PT Individual Time Calculation (min): 73 min, 57 min   Short Term Goals: Week 1:  PT Short Term Goal 1 (Week 1): STG=LTG due to ELOS  Skilled Therapeutic Interventions/Progress Updates:     Treatment 1: Pt supine in bed upon arrival. Pt reports 5/10 pain in L knee and agreeable to therapy. Rest breaks and repositioning provided. Session emphasized functional strengthening and endurance/activity tolerance with ambulation and transfers. Pt amb from room to ortho gym 155 ft using RW with SBA and WC in tow. Pt navigated 4-6 inch hurdles x3 trials using RW leading with R LE for improved stance time/tolerance as well as knee flexion in L LE when advancing. CGA provided for activity. Pt required seated rest break between trials due to pain and fatigue. Introduced pt to rollator and educated pt on geographical information systems officer and importance of backing rollator against wall prior to sitting. Pt trialed amb using rollator with CGA overall and VC for proximity and braking. Due to pt's heavy reliance on B UE for amb, RW is a safer option at this time. Pt utilized NuStep focusing on L knee ROM, strengthening, and endurance - L4 x 8 min, seat set at 9 and pt encouraged to use as much knee ROM as he can tolerate, 285 steps, and 32 avg steps per min. Pt transported dependent in [facility] back to room for time/energy/pain management. Pt remained seated in WC, L LE elevated, and all needs in reach at end of session.  Treatment 2: Pt seated in WC L LE elevated upon arrival. Pt reports 5/10 pain in L knee and agreeable to therapy. Rest breaks and repositioning provided. Session emphasized functional strengthening and endurance/activity tolerance with ambulation, transfers, and stair negotiation. Pt asc/desc 4-6 inch steps x 3 trials using B  HR with CGA during first two trials and single HR with light min A during third trial. Pt amb ~90 and ~75 using RW with SBA/supervision. In supine position pt worked on L knee ROM/strength exercises - TKE with heel propped on small wedge for extension and heel slides with OP from gait belt for flexion. PT encouraged to push into flex and ext within his tolerance for carryover into gait and transfers. Pt transported dependent in [facility] back to room for time/energy/pain management. Pt remained seated in WC, L LE elevated, and all needs in reach at end of session.  Therapy Documentation Precautions:  Precautions Precautions: Fall, Knee Precaution/Restrictions Comments: Reviewed importance of terminal knee extension, elevation in extension  no pillow behind knee. Restrictions Weight Bearing Restrictions Per Provider Order: No LLE Weight Bearing Per Provider Order: Weight bearing as tolerated Other Position/Activity Restrictions: WBAT  Therapy/Group: Individual Therapy  [name], PT, DPT 02/05/2024, 7:45 AM

## 2024-02-05 NOTE — Progress Notes (Signed)
 Occupational Therapy Session Note  Patient Details  Name: William Hatfield MRN: [id number] Date of Birth: March 21, 1945  Today's Date: 02/05/2024 OT Individual Time: 9269-9154 OT Individual Time Calculation (min): 75 min    Short Term Goals: Week 1:  OT Short Term Goal 1 (Week 1): STG=LTG d/t ELOS  Skilled Therapeutic Interventions/Progress Updates:  Patient agreeable to participate in OT session. Reports 5/10 pain level.   Patient participated in skilled OT session focusing on bathing and dressing self care/ LE ROM to increase safety with standing ADLS. Patient received at EOB finishing breakfast. Patient SU assist to gather clothing. Patient completed functional mobility to bathrooom SUP. Patient then completed clothing doffing with SUP in sitting and standing. Patient demonstrated good safety awareness with dressing doffing. Patient then completed showering with SU to SUP A. Patient able to wash all parts of body with SUP for safety. Patient then completed dressing UB mod I, LB SUP. Patient completed dressing sitting on BSC in bathroom/ EOB. Patient then completed wound care due to small wound in sacrum area with nursing. Patient reported slight increase in pain with LE ROM/ strengthening. Patient completed quad sets, knee hangs, and PROM to increase patients ROM limiting ability to completed functional mobility related to ADLS. Patient educated on need for skin checks due to increased swelling observed in b/l LE. Patient completed all sit to stands with SUP A today leading to increased safety with dressing and functional ADLS. Patient returned to bed all needs in reach alarm on.   Therapy Documentation Precautions:  Precautions Precautions: Fall, Knee Precaution/Restrictions Comments: Reviewed importance of terminal knee extension, elevation in extension  no pillow behind knee. Restrictions Weight Bearing Restrictions Per Provider Order: No LLE Weight Bearing Per Provider Order: Weight bearing  as tolerated Other Position/Activity Restrictions: WBAT  Therapy/Group: Individual Therapy  D'mariea L Dao Mearns 02/05/2024, 7:21 AM

## 2024-02-05 NOTE — Progress Notes (Signed)
 PROGRESS NOTE   Subjective/Complaints: No acute events last night.  Reports poor sleep- hard to get comfortable in hospital bed. No additional new complaints or concerns this AM.    ROS: Patient denies fever, rash, sore throat, blurred vision,  nausea, vomiting, diarrhea, cough, shortness of breath or chest pain,   headache, or mood change.  +diarrhea/constipation +SOB -no complaints + Indigestion + insomnia  HA yesterday has resolved  Objective:   No results found. Recent Labs    02/04/24 0621  WBC 12.5*  HGB 10.6*  HCT 32.5*  PLT 368    Recent Labs    02/03/24 0714 02/04/24 0621  NA 135 135  K 4.2 4.7  CL 107 105  CO2 22 20*  GLUCOSE 110* 100*  BUN 31* 30*  CREATININE 1.61* 1.59*  CALCIUM  8.7* 8.9    Intake/Output Summary (Last 24 hours) at 02/05/2024 0935 Last data filed at 02/05/2024 0827 Gross per 24 hour  Intake 720 ml  Output 800 ml  Net -80 ml     Wound 01/30/24 1425 Pressure Injury Buttocks Right Stage 2 -  Partial thickness loss of dermis presenting as a shallow open injury with a red, pink wound bed without slough. (Active)    Physical Exam: Vital Signs Blood pressure 130/80, pulse 64, temperature 97.6 F (36.4 C), temperature source Oral, resp. rate 17, height 5' 11 (1.803 m), weight 84.9 kg, SpO2 99%.  Constitutional: No distress . Vital signs reviewed.  Sitting in WC HEENT: NCAT, EOMI, oral membranes moist Neck: supple Cardiovascular: RRR without murmur. No JVD    Respiratory/Chest: CTA Bilaterally without wheezes or rales. Normal effort    GI/Abdomen: BS +, non-tender, non-distended Ext: no clubbing, cyanosis, 2+ bilateral lower extremity edema Psych: pleasant and cooperative  Skin: Visible surfaces intact  incision CDI with steristrips.  Mild erythema around inferior aspect of incision. Right buttock wound   Neuro:  Alert and oriented x 3. Normal insight and awareness. Intact  Memory. Normal language and speech. Cranial nerve exam unremarkable.  MMT: Bilateral upper extremities 5 out of 5. RLE 4 to 5/5. LLE 3-4/5 . Sensory exam normal for light touch and pain in all 4 limbs.  No limb ataxia or cerebellar signs. No abnormal tone appreciated.  .   + BL UE intention tremor  Prior neuro assessment is c/w today's exam 02/05/2024.   Musculoskeletal: Passive flexion to about 90 degrees.  Left knee swelling present.   Assessment/Plan: 1. Functional deficits which require 3+ hours per day of interdisciplinary therapy in a comprehensive inpatient rehab setting. Physiatrist is providing close team supervision and 24 hour management of active medical problems listed below. Physiatrist and rehab team continue to assess barriers to discharge/monitor patient progress toward functional and medical goals  Care Tool:  Bathing              Bathing assist Assist Level: Supervision/Verbal cueing     Upper Body Dressing/Undressing Upper body dressing        Upper body assist Assist Level: Supervision/Verbal cueing    Lower Body Dressing/Undressing Lower body dressing            Lower body assist Assist for  lower body dressing: Minimal Assistance - Patient > 75%     Toileting Toileting    Toileting assist Assist for toileting: Contact Guard/Touching assist     Transfers Chair/bed transfer  Transfers assist     Chair/bed transfer assist level: Contact Guard/Touching assist     Locomotion Ambulation   Ambulation assist      Assist level: Contact Guard/Touching assist Assistive device: Walker-rolling Max distance: 100   Walk 10 feet activity   Assist     Assist level: Contact Guard/Touching assist Assistive device: Walker-rolling   Walk 50 feet activity   Assist Walk 50 feet with 2 turns activity did not occur: Safety/medical concerns (pain/weakness/fatigue)  Assist level: Contact Guard/Touching assist Assistive device:  Walker-rolling    Walk 150 feet activity   Assist Walk 150 feet activity did not occur: Safety/medical concerns (pain/weakness/fatigue)         Walk 10 feet on uneven surface  activity   Assist Walk 10 feet on uneven surfaces activity did not occur: Safety/medical concerns (pain/weakness/fatigue)         Wheelchair     Assist Is the patient using a wheelchair?: Yes Type of Wheelchair: Manual    Wheelchair assist level: Contact Guard/Touching assist Max wheelchair distance: 75 ft    Wheelchair 50 feet with 2 turns activity    Assist        Assist Level: Contact Guard/Touching assist   Wheelchair 150 feet activity     Assist      Assist Level: Moderate Assistance - Patient 50 - 74%   Blood pressure 130/80, pulse 64, temperature 97.6 F (36.4 C), temperature source Oral, resp. rate 17, height 5' 11 (1.803 m), weight 84.9 kg, SpO2 99%.  Medical Problem List and Plan: 1. 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   weightbearing as tolerated             -patient may shower             -ELOS/Goals: 8-12 days modI             -12/5 Continue CIR therapies including PT, OT   -TEDS for LE edema.   2.  Antithrombotics: -DVT/anticoagulation:  Pharmaceutical: Eliquis .  Venous Doppler studies nuclear medicine perfusion scan negative             -antiplatelet therapy: N/A   3. Pain Management: continue Robaxin  and oxycodone  as needed  -Pain overall under control, he tries to avoid taking pain medications   4. Mood/Behavior/Sleep: Provide emotional support             -antipsychotic agents: N/A  - 12/8 melatonin for insomnia  -12/9 trazodone  PRN- pt asks for this not to be scheduled for sleep 5. Neuropsych/cognition: This patient is capable of making decisions on his own behalf.   6. Skin/Wound Care/pressure injury buttocks: WOC consulted for dressing wound care routine skin checks   -staples out. Keep  incision clean and dry  7. Fluids/Electrolytes/Nutrition: Routine in and outs with follow-up chemistries  12/6: ?why on fluid restriction - defer to primary team   8.  Leukocytosis/HCAP.  Completing 7-day course cefepime  as well as Bactrim .  MRSA PCR negative   12/4 wbcs down to 15.2k---> recheck Monday   12/8 WBC down to 12.5 improved  9.  Elevated troponin/PAF/PVCs/NSVT with history of CHB status post PPM.  Continue chronic Eliquis  as well as Lopressor  25 mg twice daily.  Imdur  currently on hold.  Follow-up cardiology services.  Elevated troponin felt to be related to demand  ischemia as well as related to pneumonia.   10.  CAD/CABG 2006.CHF.  Followed by cardiology services    - EF 50-55%  12/6: Monitor vitals with IVF, consider CXR in AM if any desats/sob  12-7: Fluid resuscitation limited by increasing edema.  Appears Lasix  40 milligrams was resumed this morning, will DC for tomorrow, may just be overdiuresed and need a few days before resuming/adjusting dose.  Courage patient to wear TED hose  -daily weight Filed Weights   01/30/24 1303 01/30/24 1739 01/30/24 1740  Weight: 84.9 kg 84.9 kg 84.9 kg      11.  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  Follow-up chemistries.  Losartan  and Jardiance  currently on hold   12/4 Cr with bump to 1.4 today. Will recheck tomorrow to make sure there is no progression. 12/5 dizzy today, Cr continues to creep up, BUN still elevated.   -will hold lasix  tomorrow  -push fluids today  -repeat BMET in am.  12/6: Creatinine up to 1.71, BUN 37; PO fluids very poor; will give IV fluids through today and 75 cc/h and repeat in AM.  12-7: BUN/creatinine slightly improved to 1.6, however significant peripheral edema with IV fluid.  Will likely increase again tomorrow with Lasix , hold a.m. dose Lasix , encourage p.o. fluids. 12-8 BUN and creatinine 30/1.59, continue current regimen for now  12.  Hyperlipidemia: continue Lipitor   13.   Constipation: continue MiraLAX  daily, Senokot -S 2 tablets nightly  12/5 -had multiple BM's last night  LBM 12/5, hard  11/9 Pt reports BM yesterday  14. Post-op anemia/?ACD  -12/8 hemoglobin stable at 10.6  15.  Indigestion   - Continue PPI twice daily, Maalox available as needed  -12/9 reports improved, discussed maalox PRN  LOS: 6 days A FACE TO FACE EVALUATION WAS PERFORMED  Murray Collier 02/05/2024, 9:35 AM

## 2024-02-06 DIAGNOSIS — G4700 Insomnia, unspecified: Secondary | ICD-10-CM | POA: Diagnosis not present

## 2024-02-06 DIAGNOSIS — M79605 Pain in left leg: Secondary | ICD-10-CM

## 2024-02-06 MED ORDER — SENNOSIDES-DOCUSATE SODIUM 8.6-50 MG PO TABS: 2.0000 | ORAL_TABLET | Freq: Every day | ORAL | Status: DC

## 2024-02-06 MED ORDER — FUROSEMIDE 40 MG PO TABS
40.0000 mg | ORAL_TABLET | Freq: Every morning | ORAL | Status: DC
  Administered 2024-02-06 – 2024-02-09 (×4): 40 mg via ORAL
  Filled 2024-02-06 (×4): qty 1

## 2024-02-06 NOTE — Progress Notes (Signed)
 PROGRESS NOTE   Subjective/Complaints: Continued left leg pain but declines pain medications.    ROS: Patient denies fever, rash, sore throat, blurred vision,  nausea, vomiting, diarrhea, cough, shortness of breath or chest pain,   headache, or mood change.  +diarrhea/constipation- improved + Indigestion + insomnia    Objective:   No results found. Recent Labs    02/04/24 0621  WBC 12.5*  HGB 10.6*  HCT 32.5*  PLT 368    Recent Labs    02/04/24 0621  NA 135  K 4.7  CL 105  CO2 20*  GLUCOSE 100*  BUN 30*  CREATININE 1.59*  CALCIUM  8.9    Intake/Output Summary (Last 24 hours) at 02/06/2024 0943 Last data filed at 02/06/2024 0800 Gross per 24 hour  Intake 598 ml  Output 700 ml  Net -102 ml     Wound 01/30/24 1425 Pressure Injury Buttocks Right Stage 2 -  Partial thickness loss of dermis presenting as a shallow open injury with a red, pink wound bed without slough. (Active)    Physical Exam: Vital Signs Blood pressure 132/67, pulse 80, temperature 98 F (36.7 C), temperature source Oral, resp. rate 17, height 5' 11 (1.803 m), weight 84.9 kg, SpO2 100%.  Constitutional: No distress . Vital signs reviewed.  Sitting in WC HEENT: NCAT, EOMI, oral membranes moist Neck: supple Cardiovascular: RRR without murmur. No JVD    Respiratory/Chest: CTA Bilaterally without wheezes or rales. Normal effort    GI/Abdomen: BS +, non-tender, non-distended Ext: no clubbing, cyanosis, 1 to 2+ bilateral lower extremity edema Psych: pleasant and cooperative  Skin: Visible surfaces intact  incision CDI with steristrips.  Mild erythema around inferior aspect of incision. Right buttock wound   Neuro:  Alert and oriented x 3. Normal insight and awareness. Intact Memory. Normal language and speech. Cranial nerve exam unremarkable.  MMT: Bilateral upper extremities 5 out of 5. RLE 4 to 5/5. LLE 3-4/5 . Sensory exam normal for  light touch and pain in all 4 limbs.  No limb ataxia or cerebellar signs. No abnormal tone appreciated.  .   + BL UE intention tremor  Prior neuro assessment is c/w today's exam 02/06/2024.   Musculoskeletal: Passive flexion to about 90 degrees.  Left knee swelling present.   Assessment/Plan: 1. Functional deficits which require 3+ hours per day of interdisciplinary therapy in a comprehensive inpatient rehab setting. Physiatrist is providing close team supervision and 24 hour management of active medical problems listed below. Physiatrist and rehab team continue to assess barriers to discharge/monitor patient progress toward functional and medical goals  Care Tool:  Bathing              Bathing assist Assist Level: Supervision/Verbal cueing     Upper Body Dressing/Undressing Upper body dressing        Upper body assist Assist Level: Supervision/Verbal cueing    Lower Body Dressing/Undressing Lower body dressing            Lower body assist Assist for lower body dressing: Minimal Assistance - Patient > 75%     Toileting Toileting    Toileting assist Assist for toileting: Contact Guard/Touching assist  Transfers Chair/bed transfer  Transfers assist     Chair/bed transfer assist level: Contact Guard/Touching assist     Locomotion Ambulation   Ambulation assist      Assist level: Contact Guard/Touching assist Assistive device: Walker-rolling Max distance: 100   Walk 10 feet activity   Assist     Assist level: Contact Guard/Touching assist Assistive device: Walker-rolling   Walk 50 feet activity   Assist Walk 50 feet with 2 turns activity did not occur: Safety/medical concerns (pain/weakness/fatigue)  Assist level: Contact Guard/Touching assist Assistive device: Walker-rolling    Walk 150 feet activity   Assist Walk 150 feet activity did not occur: Safety/medical concerns (pain/weakness/fatigue)         Walk 10 feet on  uneven surface  activity   Assist Walk 10 feet on uneven surfaces activity did not occur: Safety/medical concerns (pain/weakness/fatigue)         Wheelchair     Assist Is the patient using a wheelchair?: Yes Type of Wheelchair: Manual    Wheelchair assist level: Contact Guard/Touching assist Max wheelchair distance: 75 ft    Wheelchair 50 feet with 2 turns activity    Assist        Assist Level: Contact Guard/Touching assist   Wheelchair 150 feet activity     Assist      Assist Level: Moderate Assistance - Patient 50 - 74%   Blood pressure 132/67, pulse 80, temperature 98 F (36.7 C), temperature source Oral, resp. rate 17, height 5' 11 (1.803 m), weight 84.9 kg, SpO2 100%.  Medical Problem List and Plan: 1. 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   weightbearing as tolerated             -patient may shower             -ELOS/Goals: 8-12 days modI             -12/5 Continue CIR therapies including PT, OT   -TEDS for LE edema. Discussed use of TEDs and leg elevation  2.  Antithrombotics: -DVT/anticoagulation:  Pharmaceutical: Eliquis .  Venous Doppler studies nuclear medicine perfusion scan negative             -antiplatelet therapy: N/A   3. Pain Management: continue Robaxin  and oxycodone  as needed  -Pain overall under control, he tries to avoid taking pain medications  -Discussed Journavx- pt would like to hold      4. Mood/Behavior/Sleep: Provide emotional support             -antipsychotic agents: N/A  - 12/8 melatonin for insomnia  -12/9 trazodone  PRN- pt asks for this not to be scheduled for sleep  -12/10 Pt did not use prn trazadone last night- monitor   5. Neuropsych/cognition: This patient is capable of making decisions on his own behalf.   6. Skin/Wound Care/pressure injury buttocks: WOC consulted for dressing wound care routine skin checks   -staples out. Keep incision clean and  dry  7. Fluids/Electrolytes/Nutrition: Routine in and outs with follow-up chemistries  12/6: ?why on fluid restriction - defer to primary team   8.  Leukocytosis/HCAP.  Completing 7-day course cefepime  as well as Bactrim .  MRSA PCR negative   12/4 wbcs down to 15.2k---> recheck Monday   12/8 WBC down to 12.5 improved  9.  Elevated troponin/PAF/PVCs/NSVT with history of CHB status post PPM.  Continue chronic Eliquis  as well as Lopressor  25 mg twice daily.  Imdur  currently on hold.  Follow-up cardiology services.  Elevated troponin felt to be related to demand  ischemia as well as related to pneumonia.   10.  CAD/CABG 2006.CHF.  Followed by cardiology services    - EF 50-55%  12/6: Monitor vitals with IVF, consider CXR in AM if any desats/sob  12-7: Fluid resuscitation limited by increasing edema.  Appears Lasix  40 milligrams was resumed this morning, will DC for tomorrow, may just be overdiuresed and need a few days before resuming/adjusting dose.  Courage patient to wear TED hose  -daily weight  12/10 Rolan Pitch spoke with cardiology HF team, resume lasix  hold losartan .  Filed Weights   01/30/24 1303 01/30/24 1739 01/30/24 1740  Weight: 84.9 kg 84.9 kg 84.9 kg      11.  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  Follow-up chemistries.  Losartan  and Jardiance  currently on hold   12/4 Cr with bump to 1.4 today. Will recheck tomorrow to make sure there is no progression. 12/5 dizzy today, Cr continues to creep up, BUN still elevated.   -will hold lasix  tomorrow  -push fluids today  -repeat BMET in am.  12/6: Creatinine up to 1.71, BUN 37; PO fluids very poor; will give IV fluids through today and 75 cc/h and repeat in AM.  12-7: BUN/creatinine slightly improved to 1.6, however significant peripheral edema with IV fluid.  Will likely increase again tomorrow with Lasix , hold a.m. dose Lasix , encourage p.o. fluids. 12-8 BUN and creatinine 30/1.59, continue current regimen for  now Recheck labs tomorrow  12.  Hyperlipidemia: continue Lipitor   13.  Constipation: continue MiraLAX  daily, Senokot -S 2 tablets nightly  12/5 -had multiple BM's last night  LBM 12/5, hard  11/10 LBM today  14. Post-op anemia/?ACD  -12/8 hemoglobin stable at 10.6  Recheck tomorrow   15.  Indigestion   - Continue PPI twice daily, Maalox available as needed  -12/9 reports improved, discussed maalox PRN  LOS: 7 days A FACE TO FACE EVALUATION WAS PERFORMED  Murray Collier 02/06/2024, 9:43 AM

## 2024-02-06 NOTE — Progress Notes (Signed)
 Physical Therapist participated in the interdisciplinary team conference, providing clinical information regarding the patient's current status, treatment goals, and weekly focus, including any barriers that need to be addressed. Please see the Inpatient Rehabilitation Team Conference and Plan of Care Update for further details.     [place]er [place], DPT

## 2024-02-06 NOTE — Progress Notes (Signed)
 Occupational Therapist participated in the interdisciplinary team conference, providing clinical information regarding the patient's current status, treatment goals, and weekly focus, including any barriers that need to be addressed. Please see the Inpatient Rehabilitation Team Conference and Plan of Care Update for further details.   D'mariea Osceola Depaz OTR/L

## 2024-02-06 NOTE — Progress Notes (Signed)
 Occupational Therapy Session Note  Patient Details  Name: William Hatfield MRN: [id number] Date of Birth: 25-Oct-1945  Today's Date: 02/06/2024 OT Individual Time: 1416-1530 OT Individual Time Calculation (min): 74 min    Short Term Goals: Week 1:  OT Short Term Goal 1 (Week 1): STG=LTG d/t ELOS  Skilled Therapeutic Interventions/Progress Updates:    Patient agreeable to participate in OT session. Reports 5/10 pain level, pre medicated. Patient participated in skilled OT session focusing on showering at highest level, functional mobility, LE ROM, and sit to stands. Patient received in chair. Requested to complete functional shower. OT requested patient complete hands off including set up. Patient able to gather clothes with SUP to mod I. Patient able to complete LB/ UB doffing sitting in seat with SUP to mod I. Transfer to shower SUP. Drying completed SUP to mod I. Able to gather clothes following shower and doff pants, shirt SUP to mod I. Patient required total A to don ted hose. Patient then able to complete functional mobility to complete activity tolerance and LE ROM. Patient completed 3 sets of sit to stands x5 with no UE support. Completed knee hangs from edge of mat table 3x30 seconds to increase LE ROM related to safety with functional mobility and safety. Able to complete rolling on mat table SUP to CGA. Patient then completed functional mobility to room SUP A with RW. Left in room all needs in reach. .  Therapy Documentation Precautions:  Precautions Precautions: Fall, Knee Precaution/Restrictions Comments: Reviewed importance of terminal knee extension, elevation in extension  no pillow behind knee. Restrictions Weight Bearing Restrictions Per Provider Order: Yes LLE Weight Bearing Per Provider Order: Weight bearing as tolerated Other Position/Activity Restrictions: WBAT  Therapy/Group: Individual Therapy  D'mariea L Lemmie Vanlanen 02/06/2024, 3:23 PM

## 2024-02-06 NOTE — Plan of Care (Signed)
°  Problem: Consults Goal: RH GENERAL PATIENT EDUCATION Description: See Patient Education module for education specifics. Outcome: Progressing   Problem: RH BOWEL ELIMINATION Goal: RH STG MANAGE BOWEL WITH ASSISTANCE Description: STG Manage Bowel with supervision Assistance. Outcome: Progressing   Problem: RH BLADDER ELIMINATION Goal: RH STG MANAGE BLADDER WITH ASSISTANCE Description: STG Manage Bladder With supervision Assistance Outcome: Progressing   Problem: RH SKIN INTEGRITY Goal: RH STG SKIN FREE OF INFECTION/BREAKDOWN Description: Manage skin free of infection with supervision assistance Outcome: Progressing   Problem: RH SAFETY Goal: RH STG ADHERE TO SAFETY PRECAUTIONS W/ASSISTANCE/DEVICE Description: STG Adhere to Safety Precautions With supervision Assistance/Device. Outcome: Progressing   Problem: RH PAIN MANAGEMENT Goal: RH STG PAIN MANAGED AT OR BELOW PT'S PAIN GOAL Description: <4 w/ prns Outcome: Progressing   Problem: RH KNOWLEDGE DEFICIT GENERAL Goal: RH STG INCREASE KNOWLEDGE OF SELF CARE AFTER HOSPITALIZATION Description: Manage increase knowledge of self care after hospitalization with supervision assistance from daughter using educational materials provided Outcome: Progressing

## 2024-02-06 NOTE — Progress Notes (Signed)
 Physical Therapy Session Note  Patient Details  Name: William Hatfield MRN: [id number] Date of Birth: 1946/01/05  Today's Date: 02/06/2024 PT Individual Time: 0702-0728 and [id number] PT Individual Time Calculation (min): 26 min and 70 min  Short Term Goals: Week 1:  PT Short Term Goal 1 (Week 1): STG=LTG due to ELOS  Skilled Therapeutic Interventions/Progress Updates:   Treatment Session 1 Received pt sitting EOB, pt agreeable to PT treatment, and reported pain 3/10 in L knee (declined pain medication). Breakfast tray had not arrived - therapist located tray and encouraged getting OOB to eat - pt agreed. Transferred into WC with RW and supervision and set pt up to eat breakfast. Due to tremors from Parkinson's, therapist cut up pancakes but pt with difficulty feeding himself. Added 1.5lb wrist weights to each arm, tremors lessened, and pt able to feed himself with ++ time. Pt's daughter called and requesting cardiology consult - MD notified. Set pt up at sink to wash face/brush teeth and left in care of LPN due to time restrictions.   Treatment Session 2 Received pt sitting in WC, pt agreeable to PT treatment, and reported pain 3/10 in L knee (declined pain medication). Session with emphasis on functional mobility/transfers, generalized strengthening and endurance, dynamic standing balance/coordination, NMR, and ambulation. Pt performed WC mobility 165ft using BUE and supervision to main therapy gym with emphasis on UE strength.   Pt performed all transfers with RW and supervision throughout session. Pt ambulated 243ft with RW and CGA fading to close supervision with frequent standing rest breaks to catch break but no LOB noted. Transitioned to seated L knee extension 2x10 with 3lb ankle weight and active assisted L heel slides 2x20 with emphasis on L knee extension ROM. Stood and performed the following exercises with emphasis on L knee extension and LE strengthening: -L TKE 2x10 -mini squats 2x10   Transported to dayroom and performed BLE strengthening on Nustep at workload 4 for 8 minutes with emphasis on cardiovascular endurance and L knee ROM. Total of 332 steps, 32 SPM, 0.2 miles, and 1.5 METs. Returned to room and concluded session with pt sitting in [name] with all needs within reach and MD at bedside. Provided pt with ice pack for L knee for pain relief.   Therapy Documentation Precautions:  Precautions Precautions: Fall, Knee Precaution/Restrictions Comments: Reviewed importance of terminal knee extension, elevation in extension  no pillow behind knee. Restrictions Weight Bearing Restrictions Per Provider Order: Yes LLE Weight Bearing Per Provider Order: Weight bearing as tolerated Other Position/Activity Restrictions: WBAT  Therapy/Group: Individual Therapy William Hatfield William Hatfield PT, DPT 02/06/2024, 6:33 AM

## 2024-02-06 NOTE — Progress Notes (Signed)
 Physical Therapy Session Note  Patient Details  Name: William Hatfield MRN: [id number] Date of Birth: 09-24-1945  Today's Date: 02/06/2024 PT Individual Time: 1047-1129 PT Individual Time Calculation (min): 42 min   Short Term Goals: Week 1:  PT Short Term Goal 1 (Week 1): STG=LTG due to ELOS  Skilled Therapeutic Interventions/Progress Updates:     Pt seated in WC upon arrival. Pt reports 5/10 pain in L knee, declined pain meds. Agreeable to therapy. Session emphasized functional strengthening and endurance with ambulation as well as improved L knee ROM. Pt amb ~165 ft from room <> main gym using RW with close supervision with occasional standing rest breaks due to pain/fatigue. VCs to remember to breathe and avoid holding breath due to pain. Pt much more fatigued during walk back. PT performed passive stretching to L knee for extension ROM in seated position. Pt able to tolerate ~5 min of stretching at a time before needing to take a break. PT also performed gentle soft tissue mobilization to L distal hamstrings while stretching for reduced pain and improved ROM. Pt encouraged to place pillows or towel roll under L ankle while lying in bed to help improve knee extension ROM for 5-10 min at a time if tolerated. Pt verbalized understanding. At end of session, pt remained seated in WC, ice pack on L knee, and all needs in reach.  Therapy Documentation Precautions:  Precautions Precautions: Fall, Knee Precaution/Restrictions Comments: Reviewed importance of terminal knee extension, elevation in extension  no pillow behind knee. Restrictions Weight Bearing Restrictions Per Provider Order: Yes LLE Weight Bearing Per Provider Order: Weight bearing as tolerated Other Position/Activity Restrictions: WBAT  Therapy/Group: Individual Therapy  [name], PT, DPT 02/06/2024, 7:41 AM

## 2024-02-06 NOTE — Patient Care Conference (Signed)
 Inpatient RehabilitationTeam Conference and Plan of Care Update Date: 02/06/2024   Time: 0941am    Patient Name: William Hatfield      Medical Record Number: [id number]  Date of Birth: [birth year] Sex: Male         Room/Bed: 4M07C/4M07C-01 Payor Info: Payor: MEDICARE / Plan: MEDICARE PART A AND B / Product Type: *No Product type* /    Admit Date/Time:  01/30/2024 12:51 PM  Primary Diagnosis:  HCAP (healthcare-associated pneumonia)  Hospital Problems: Principal Problem:   HCAP (healthcare-associated pneumonia)    Expected Discharge Date: Expected Discharge Date: 02/09/24  Team Members Present: Physician leading conference: Dr. Murray Collier Social Worker Present: Waverly Gentry, LCSW-A Nurse Present: Eulalio Falls, RN PT Present: Izetta Seip, PT OT Present: Michaelyn Seip, OT PPS Coordinator present : Eleanor Colon, SLP     Current Status/Progress Goal Weekly Team Focus  Bowel/Bladder      Continent of bowel and bladder     Remain continent of bowel and bladder     Assess bowel and bladder q shift  Swallow/Nutrition/ Hydration               ADL's   SUP to CGA- decreased LE ROM and strength   mod I   ADL tolerance, ROM, strength, functional mobility. 12/13    Mobility   bed mob supervision; transfers CGA/sup; ambulation close supervision; limited by pain/fatigue/ROM   mod I  increasing tolerance with further amb distance; L knee ROM/strength; transfers; DC 12/13    Communication                Safety/Cognition/ Behavioral Observations               Pain      Left leg pain    <4 w/ prns    Assess pain q shift  Skin      Stage 2 right buttom; MASD sacral area with gerharts cream   Stage 2 and MASD will heal at discharge    Assess skin q shift    Discharge Planning:  Will discharge home alone. Daughter to provide care and support as needed. [facility] to be reinstated following discharge for PT/OT services. No DME needs.    Team  Discussion: Patient was admitted post Debility/HCAP/cellulitis after recent left TKA. Patient with insomnia/ lower extremity edema: medication and treatments adjusted by MD. Patient progress limited by decreased LE strength and ROM, fatigue,  poor endurance.  Patient on target to meet rehab goals: Currently patient needs supervision-mod I with ADLs depending on time of the day. Patient needs supervision assistance with ambulation CGA at times. Overall goals at discharge are set for supervison- mod I assistance.  *See Care Plan and progress notes for long and short-term goals.   Revisions to Treatment Plan:  WOC consult Cardiology consult Fluid restriction TED hose Daily Weight Strict I and O  K pad  Teaching Needs: Safety, medications, wound care, incision site care, transfers, toileting, etc   Current Barriers to Discharge: Decreased caregiver support, Home enviroment access/layout, and Wound care  Possible Resolutions to Barriers: Family Education Home health follow up DME: walker, shower chair     Medical Summary Current Status: Debility, L TKA, insomnia, constipation, CHF, CAD  Barriers to Discharge: Cardiac Complications;Medical stability  Barriers to Discharge Comments: Debility, L TKA, insomnia, constipation, CHF, CAD, edema Possible Resolutions to [employer] Focus: Discuss with cardiology team, monitor CBC, monitor bowel function, monitor fluid intake   Continued Need for Acute Rehabilitation Level of Care:  The patient requires daily medical management by a physician with specialized training in physical medicine and rehabilitation for the following reasons: Direction of a multidisciplinary physical rehabilitation program to maximize functional independence : Yes Medical management of patient stability for increased activity during participation in an intensive rehabilitation regime.: Yes Analysis of laboratory values and/or radiology reports with any subsequent  need for medication adjustment and/or medical intervention. : Yes   I attest that I was present, lead the team conference, and concur with the assessment and plan of the team.   Zahira Brummond Gayo 02/06/2024, 0941am

## 2024-02-06 NOTE — Progress Notes (Addendum)
 Patient ID: William Hatfield, male   DOB: 1945-07-04, 78 y.o.   MRN: [id number]  Have reviewed team conference with pt and family. Both aware and agreeable with targeted d/c date of 12/13 and goals of Independent with assistive device.  He will resume [facility] PT/OT services with [facility].  Patient's daughter inquired about making patient independent in the room if applicable.

## 2024-02-07 DIAGNOSIS — D62 Acute posthemorrhagic anemia: Secondary | ICD-10-CM

## 2024-02-07 DIAGNOSIS — N183 Chronic kidney disease, stage 3 unspecified: Secondary | ICD-10-CM | POA: Diagnosis not present

## 2024-02-07 LAB — CBC
HCT: 32.8 % — ABNORMAL LOW (ref 39.0–52.0)
Hemoglobin: 10.5 g/dL — ABNORMAL LOW (ref 13.0–17.0)
MCH: 30 pg (ref 26.0–34.0)
MCHC: 32 g/dL (ref 30.0–36.0)
MCV: 93.7 fL (ref 80.0–100.0)
Platelets: 344 K/uL (ref 150–400)
RBC: 3.5 MIL/uL — ABNORMAL LOW (ref 4.22–5.81)
RDW: 15.6 % — ABNORMAL HIGH (ref 11.5–15.5)
WBC: 10.5 K/uL (ref 4.0–10.5)
nRBC: 0 % (ref 0.0–0.2)

## 2024-02-07 LAB — BASIC METABOLIC PANEL WITH GFR
Anion gap: 9 (ref 5–15)
BUN: 34 mg/dL — ABNORMAL HIGH (ref 8–23)
CO2: 22 mmol/L (ref 22–32)
Calcium: 8.9 mg/dL (ref 8.9–10.3)
Chloride: 102 mmol/L (ref 98–111)
Creatinine, Ser: 1.34 mg/dL — ABNORMAL HIGH (ref 0.61–1.24)
GFR, Estimated: 55 mL/min — ABNORMAL LOW (ref >60)
Glucose, Bld: 99 mg/dL (ref 70–99)
Potassium: 4.1 mmol/L (ref 3.5–5.1)
Sodium: 133 mmol/L — ABNORMAL LOW (ref 135–145)

## 2024-02-07 MED ORDER — SENNOSIDES-DOCUSATE SODIUM 8.6-50 MG PO TABS
1.0000 | ORAL_TABLET | Freq: Every day | ORAL | Status: DC
  Administered 2024-02-07: 1 via ORAL
  Filled 2024-02-07 (×2): qty 1

## 2024-02-07 NOTE — Progress Notes (Signed)
 Physical Therapy Session Note  Patient Details  Name: William Hatfield MRN: [id number] Date of Birth: [birth year]  Today's Date: 02/07/2024 PT Individual Time: 9199-9156, 8699-8584 PT Individual Time Calculation (min): 43 min, 75 min  Short Term Goals: Week 1:  PT Short Term Goal 1 (Week 1): STG=LTG due to ELOS  Skilled Therapeutic Interventions/Progress Updates:     Treatment 1: Pt seated EOB upon arrival. Pt reports 3/10 pain in L knee and agreeable to therapy, declined pain meds. Rest breaks and repositioning provided. Session emphasized functional strengthening and endurance with ambulation in tighter spaced and transfers. Pt stood to RW with close supervision. Pt amb to toilet to using RW and stood to attempt voiding (unsuccessful) with close supervision. Pt amb ~130 ft room <> rehab apartment using RW with close supervision and 1 standing rest break due to pain/fatigue. Pt performed sit to stands RW <> recliner with supervision overall. Pt navigated kitchen area using RW, reaching into high cabinets, grabbing cups and bowl that he then reached down to place in dishwasher all with supervision. Pt also practiced grabbing items from refrigerator with supervision. PT passively stretched pt's L knee into extension for improved ROM. Pt remained seated in WC, L LE supported and all needs in reach at end of session.  Treatment 2: Pt seated in WC upon arrival. Pt reports 4/10 pain in L knee and agreeable to therapy, declined pain meds. Rest breaks and repositioning provided. Session emphasized functional strengthening and endurance with ambulation, transfers, balance, and overall mental/psychological wellbeing. PT took pt outside to get fresh air and sunlight for improved emotional wellbeing and stress/anxiety reduction and improved mood. While outside, PT and pt discussed pt's plans for when he goes home and process of HH PT and OP PT, as well as explaining continued improvement in the coming months while  continuing to progress with PT. Pt very appreciative of time spent outside. Upon return to floor.room, pt requested to use bathroom. Pt amb to toilet to using RW and stood to void with supervision. Continent of urine. Pt amb ~155 ft room <> main gym using RW with supervision. Pt amb in  bars, down and back, using R UE for support multiple times with CGA progressing to SBA. Pt practiced amb using hurry cane ~100 ft x2 trials with min A for stability and VC for sequencing. Once back in room, pt remained seated in WC, L LE supported and all needs in reach at end of session.  Therapy Documentation Precautions:  Precautions Precautions: Fall, Knee Precaution/Restrictions Comments: Reviewed importance of terminal knee extension, elevation in extension  no pillow behind knee. Restrictions Weight Bearing Restrictions Per Provider Order: Yes LLE Weight Bearing Per Provider Order: Weight bearing as tolerated Other Position/Activity Restrictions: WBAT  Therapy/Group: Individual Therapy  [name], PT, DPT 02/07/2024, 7:44 AM

## 2024-02-07 NOTE — Progress Notes (Shared)
 Occupational Therapy Session Note  Patient Details  Name: William Hatfield MRN: [id number] Date of Birth: [birth year]  Today's Date: 02/07/2024 OT Individual Time: 1045-1130 OT Individual Time Calculation (min): 45 min    Short Term Goals: Week 1:  OT Short Term Goal 1 (Week 1): STG=LTG d/t ELOS  Skilled Therapeutic Interventions/Progress Updates:    Skilled OT intervention with focus on functional amb with RW, standing balance, bathing at shower level, dressing with sit<>stand, and safety awareness to increase independence with BADLs. Pt amb in room with RW to gather clothing before entering bathroom. Bathing at shower level with supervision. Pt required assistance for donning socks but completed all other dressing tasks with supervision. Pt returned to w/c. All needs within reach. Ongoing home safety recommendations.   Therapy Documentation Precautions:  Precautions Precautions: Fall, Knee Precaution/Restrictions Comments: Reviewed importance of terminal knee extension, elevation in extension  no pillow behind knee. Restrictions Weight Bearing Restrictions Per Provider Order: No LLE Weight Bearing Per Provider Order: Weight bearing as tolerated Other Position/Activity Restrictions: WBAT    Pain:  Pt denies pain this morning    Therapy/Group: Individual Therapy  Maritza Debby Mare 02/07/2024, 12:42 PM

## 2024-02-07 NOTE — Plan of Care (Signed)
°  Problem: Consults Goal: RH GENERAL PATIENT EDUCATION Description: See Patient Education module for education specifics. Outcome: Progressing   Problem: RH BOWEL ELIMINATION Goal: RH STG MANAGE BOWEL WITH ASSISTANCE Description: STG Manage Bowel with supervision Assistance. Outcome: Progressing   Problem: RH BLADDER ELIMINATION Goal: RH STG MANAGE BLADDER WITH ASSISTANCE Description: STG Manage Bladder With supervision Assistance Outcome: Progressing   Problem: RH SKIN INTEGRITY Goal: RH STG SKIN FREE OF INFECTION/BREAKDOWN Description: Manage skin free of infection with supervision assistance Outcome: Progressing   Problem: RH SAFETY Goal: RH STG ADHERE TO SAFETY PRECAUTIONS W/ASSISTANCE/DEVICE Description: STG Adhere to Safety Precautions With supervision Assistance/Device. Outcome: Progressing   Problem: RH PAIN MANAGEMENT Goal: RH STG PAIN MANAGED AT OR BELOW PT'S PAIN GOAL Description: <4 w/ prns Outcome: Progressing   Problem: RH KNOWLEDGE DEFICIT GENERAL Goal: RH STG INCREASE KNOWLEDGE OF SELF CARE AFTER HOSPITALIZATION Description: Manage increase knowledge of self care after hospitalization with supervision assistance from daughter using educational materials provided Outcome: Progressing

## 2024-02-07 NOTE — Progress Notes (Signed)
 Physical Therapy Session Note  Patient Details  Name: William Hatfield MRN: [id number] Date of Birth: October 04, 1945  Today's Date: 02/07/2024 PT Individual Time: 9154-9074 PT Individual Time Calculation (min): 40 min   Short Term Goals: Week 1:  PT Short Term Goal 1 (Week 1): STG=LTG due to ELOS  Skilled Therapeutic Interventions/Progress Updates:   Received pt sitting in WC, pt agreeable to PT treatment, and reported pain 4/10 in L knee (declined pain medication but provided heat pack). Session with emphasis on functional mobility/transfers, generalized strengthening and ROM, and dynamic standing balance/coordination. Pt transported to/from room in [facility] dependently for time management purposes.   Pt performed all transfers with RW and supervision throughout session. Transitioned to semi-reclined on wedge with supervision and performed the following exercises with emphasis on L knee ROM: -L knee extension isometrics 2x10 w/ 3 second hold - towel under ankle to promote extension -active assisted L heel slides with gait belt 2x10 with 3 second hold at end range -active assisted L hamstring stretch using gait belt with support from therapist 3x30 second hold Transferred semi-reclined<>sitting EOM with supervision and returned to room. Concluded session with pt sitting in [facility] with all needs within reach.   Therapy Documentation Precautions:  Precautions Precautions: Fall, Knee Precaution/Restrictions Comments: Reviewed importance of terminal knee extension, elevation in extension  no pillow behind knee. Restrictions Weight Bearing Restrictions Per Provider Order: Yes LLE Weight Bearing Per Provider Order: Weight bearing as tolerated Other Position/Activity Restrictions: WBAT  Therapy/Group: Individual Therapy Therisa HERO Zaunegger Therisa Stains PT, DPT 02/07/2024, 6:53 AM

## 2024-02-07 NOTE — Progress Notes (Signed)
 PROGRESS NOTE   Subjective/Complaints: Continues to have some pain around his knee but also continues to decline any pain medications.  Reports had 3 bowel movements yesterday.  ROS: Patient denies fever, rash, sore throat, blurred vision,  nausea, vomiting,  cough, shortness of breath or chest pain,   headache, or mood change.  + Indigestion-improved + insomnia  + leg pain + Loose bowel movements   Objective:   No results found. Recent Labs    02/07/24 0554  WBC 10.5  HGB 10.5*  HCT 32.8*  PLT 344    Recent Labs    02/07/24 0554  NA 133*  K 4.1  CL 102  CO2 22  GLUCOSE 99  BUN 34*  CREATININE 1.34*  CALCIUM  8.9    Intake/Output Summary (Last 24 hours) at 02/07/2024 1008 Last data filed at 02/07/2024 0911 Gross per 24 hour  Intake 298 ml  Output 1150 ml  Net -852 ml     Wound 01/30/24 1425 Pressure Injury Buttocks Right Stage 2 -  Partial thickness loss of dermis presenting as a shallow open injury with a red, pink wound bed without slough. (Active)    Physical Exam: Vital Signs Blood pressure 133/61, pulse 79, temperature 98.2 F (36.8 C), temperature source Oral, resp. rate 16, height 5' 11 (1.803 m), weight 86.2 kg, SpO2 100%.  Constitutional: No distress . Vital signs reviewed.  Sitting in WC HEENT: NCAT, EOMI, oral membranes moist Neck: supple Cardiovascular: RRR without murmur. No JVD    Respiratory/Chest: CTA Bilaterally without wheezes or rales. Normal effort    GI/Abdomen: BS +, non-tender, non-distended Ext: no clubbing, cyanosis, 1 to 2+ bilateral lower extremity edema Psych: pleasant and cooperative  Skin: Visible surfaces intact  incision CDI with steristrips.  Mild erythema around inferior aspect of incision. Right buttock wound   Neuro:  Alert and oriented x 3. Normal insight and awareness. Intact Memory. Normal language and speech. Cranial nerve exam unremarkable.  MMT:  Bilateral upper extremities 5 out of 5. RLE 4 to 5/5. LLE 3-4/5 . Sensory exam normal for light touch and pain in all 4 limbs.  No limb ataxia or cerebellar signs. No abnormal tone appreciated.  .   + BL UE intention tremor  Prior neuro assessment is c/w today's exam 02/07/2024.   Musculoskeletal: Passive flexion to about 90 degrees.  Left knee swelling present.   Assessment/Plan: 1. Functional deficits which require 3+ hours per day of interdisciplinary therapy in a comprehensive inpatient rehab setting. Physiatrist is providing close team supervision and 24 hour management of active medical problems listed below. Physiatrist and rehab team continue to assess barriers to discharge/monitor patient progress toward functional and medical goals  Care Tool:  Bathing              Bathing assist Assist Level: Supervision/Verbal cueing     Upper Body Dressing/Undressing Upper body dressing        Upper body assist Assist Level: Supervision/Verbal cueing    Lower Body Dressing/Undressing Lower body dressing            Lower body assist Assist for lower body dressing: Minimal Assistance - Patient > 75%  Toileting Toileting    Toileting assist Assist for toileting: Contact Guard/Touching assist     Transfers Chair/bed transfer  Transfers assist     Chair/bed transfer assist level: Supervision/Verbal cueing     Locomotion Ambulation   Ambulation assist      Assist level: Contact Guard/Touching assist Assistive device: Walker-rolling Max distance: 249ft   Walk 10 feet activity   Assist     Assist level: Contact Guard/Touching assist Assistive device: Walker-rolling   Walk 50 feet activity   Assist Walk 50 feet with 2 turns activity did not occur: Safety/medical concerns (pain/weakness/fatigue)  Assist level: Contact Guard/Touching assist Assistive device: Walker-rolling    Walk 150 feet activity   Assist Walk 150 feet activity did not  occur: Safety/medical concerns (pain/weakness/fatigue)  Assist level: Contact Guard/Touching assist Assistive device: Walker-rolling    Walk 10 feet on uneven surface  activity   Assist Walk 10 feet on uneven surfaces activity did not occur: Safety/medical concerns (pain/weakness/fatigue)         Wheelchair     Assist Is the patient using a wheelchair?: Yes Type of Wheelchair: Manual    Wheelchair assist level: Contact Guard/Touching assist Max wheelchair distance: 75 ft    Wheelchair 50 feet with 2 turns activity    Assist        Assist Level: Contact Guard/Touching assist   Wheelchair 150 feet activity     Assist      Assist Level: Moderate Assistance - Patient 50 - 74%   Blood pressure 133/61, pulse 79, temperature 98.2 F (36.8 C), temperature source Oral, resp. rate 16, height 5' 11 (1.803 m), weight 86.2 kg, SpO2 100%.  Medical Problem List and Plan: 1. 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   weightbearing as tolerated             -patient may shower             -ELOS/Goals: 8-12 days modI             -12/5 Continue CIR therapies including PT, OT   -TEDS for LE edema. Discussed use of TEDs and leg elevation  2.  Antithrombotics: -DVT/anticoagulation:  Pharmaceutical: Eliquis .  Venous Doppler studies nuclear medicine perfusion scan negative             -antiplatelet therapy: N/A   3. Pain Management: continue Robaxin  and oxycodone  as needed  -Pain overall under control, he tries to avoid taking pain medications  -Discussed Journavx- pt would like to hold      4. Mood/Behavior/Sleep: Provide emotional support             -antipsychotic agents: N/A  - 12/8 melatonin for insomnia  -12/9 trazodone  PRN- pt asks for this not to be scheduled for sleep  -12/10 Pt did not use prn trazadone last night- monitor   5. Neuropsych/cognition: This patient is capable of making decisions on his own  behalf.   6. Skin/Wound Care/pressure injury buttocks: WOC consulted for dressing wound care routine skin checks   -staples out. Keep incision clean and dry  7. Fluids/Electrolytes/Nutrition: Routine in and outs with follow-up chemistries  12/6: ?why on fluid restriction - defer to primary team   8.  Leukocytosis/HCAP.  Completing 7-day course cefepime  as well as Bactrim .  MRSA PCR negative   12/4 wbcs down to 15.2k---> recheck Monday   12/11 WBC down to 10.5, within normal range  9.  Elevated troponin/PAF/PVCs/NSVT with history of CHB status post  PPM.  Continue chronic Eliquis  as well as Lopressor  25 mg twice daily.  Imdur  currently on hold.  Follow-up cardiology services.   Elevated troponin felt to be related to demand  ischemia as well as related to pneumonia.   10.  CAD/CABG 2006.CHF.  Followed by cardiology services    - EF 50-55%  12/6: Monitor vitals with IVF, consider CXR in AM if any desats/sob  12-7: Fluid resuscitation limited by increasing edema.  Appears Lasix  40 milligrams was resumed this morning, will DC for tomorrow, may just be overdiuresed and need a few days before resuming/adjusting dose.  Courage patient to wear TED hose  -daily weight  12/10 Rolan Pitch spoke with cardiology HF team, resume lasix  hold losartan .   12/11 leg swelling appears similar to yesterday, continue leg elevation, Lasix  and compression Filed Weights   01/30/24 1739 01/30/24 1740 02/06/24 1405  Weight: 84.9 kg 84.9 kg 86.2 kg      11.  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  Follow-up chemistries.  Losartan  and Jardiance  currently on hold   12/4 Cr with bump to 1.4 today. Will recheck tomorrow to make sure there is no progression. 12/5 dizzy today, Cr continues to creep up, BUN still elevated.   -will hold lasix  tomorrow  -push fluids today  -repeat BMET in am.  12/6: Creatinine up to 1.71, BUN 37; PO fluids very poor; will give IV fluids through today and 75 cc/h and repeat  in AM.  12-7: BUN/creatinine slightly improved to 1.6, however significant peripheral edema with IV fluid.  Will likely increase again tomorrow with Lasix , hold a.m. dose Lasix , encourage p.o. fluids. 12-8 BUN and creatinine 30/1.59, continue current regimen for now 12/11 BUN and creatinine overall stable, creatinine is better at 1.34, BUN a little higher at 34 12.  Hyperlipidemia: continue Lipitor   13.  Constipation: continue MiraLAX  daily, Senokot -S 2 tablets nightly  12/5 -had multiple BM's last night  LBM 12/5, hard  11/10 LBM today  11/11 decrease Senokot to 1 tablet nightly for loose bowel movements reported  14. Post-op anemia/?ACD  -12/8 hemoglobin stable at 10.6  -12/11 hemoglobin stable 10.5  15.  Indigestion   - Continue PPI twice daily, Maalox available as needed  -12/9 reports improved, discussed maalox PRN  LOS: 8 days A FACE TO FACE EVALUATION WAS PERFORMED  Murray Collier 02/07/2024, 10:08 AM

## 2024-02-08 ENCOUNTER — Other Ambulatory Visit (HOSPITAL_COMMUNITY): Payer: Self-pay

## 2024-02-08 DIAGNOSIS — K5900 Constipation, unspecified: Secondary | ICD-10-CM | POA: Diagnosis not present

## 2024-02-08 DIAGNOSIS — M79605 Pain in left leg: Secondary | ICD-10-CM | POA: Diagnosis not present

## 2024-02-08 DIAGNOSIS — I509 Heart failure, unspecified: Secondary | ICD-10-CM | POA: Diagnosis not present

## 2024-02-08 MED ORDER — APIXABAN 5 MG PO TABS
5.0000 mg | ORAL_TABLET | Freq: Two times a day (BID) | ORAL | 0 refills | Status: AC
  Filled 2024-02-08: qty 60, 30d supply, fill #0

## 2024-02-08 MED ORDER — GERHARDT'S BUTT CREAM
1.0000 | TOPICAL_CREAM | Freq: Two times a day (BID) | CUTANEOUS | 0 refills | Status: DC
  Filled 2024-02-08: qty 60, 30d supply, fill #0

## 2024-02-08 MED ORDER — ATORVASTATIN CALCIUM 40 MG PO TABS
40.0000 mg | ORAL_TABLET | Freq: Every evening | ORAL | 0 refills | Status: AC
  Filled 2024-02-08: qty 30, 30d supply, fill #0

## 2024-02-08 MED ORDER — OXYCODONE HCL 5 MG PO TABS
5.0000 mg | ORAL_TABLET | ORAL | 0 refills | Status: DC | PRN
  Filled 2024-02-08: qty 30, 5d supply, fill #0

## 2024-02-08 MED ORDER — METOPROLOL TARTRATE 25 MG PO TABS
25.0000 mg | ORAL_TABLET | Freq: Two times a day (BID) | ORAL | 0 refills | Status: AC
  Filled 2024-02-08: qty 60, 30d supply, fill #0

## 2024-02-08 MED ORDER — ALLOPURINOL 100 MG PO TABS
100.0000 mg | ORAL_TABLET | Freq: Every morning | ORAL | 0 refills | Status: AC
  Filled 2024-02-08: qty 30, 30d supply, fill #0

## 2024-02-08 MED ORDER — SENNOSIDES-DOCUSATE SODIUM 8.6-50 MG PO TABS: 1.0000 | ORAL_TABLET | Freq: Every day | ORAL | Status: DC

## 2024-02-08 MED ORDER — MELATONIN 3 MG PO TABS
3.0000 mg | ORAL_TABLET | Freq: Every day | ORAL | 0 refills | Status: DC
  Filled 2024-02-08: qty 30, 30d supply, fill #0

## 2024-02-08 MED ORDER — FUROSEMIDE 40 MG PO TABS
40.0000 mg | ORAL_TABLET | Freq: Every morning | ORAL | 0 refills | Status: DC
  Filled 2024-02-08: qty 30, 30d supply, fill #0

## 2024-02-08 MED ORDER — PANTOPRAZOLE SODIUM 40 MG PO TBEC
40.0000 mg | DELAYED_RELEASE_TABLET | Freq: Two times a day (BID) | ORAL | 0 refills | Status: AC
  Filled 2024-02-08: qty 60, 30d supply, fill #0

## 2024-02-08 MED ORDER — METHOCARBAMOL 500 MG PO TABS
500.0000 mg | ORAL_TABLET | Freq: Four times a day (QID) | ORAL | 0 refills | Status: DC | PRN
  Filled 2024-02-08: qty 60, 15d supply, fill #0

## 2024-02-08 NOTE — Progress Notes (Signed)
 Occupational Therapy Discharge Summary  Patient Details  Name: William Hatfield MRN: [id number] Date of Birth: Jul 27, 1945  Date of Discharge from OT service:February 08, 2024    Patient has met 9 of 9 long term goals due to improved activity tolerance, improved balance, postural control, ability to compensate for deficits, and improved coordination.  Patient to discharge at overall Modified Independent level.  Patient's care partner is independent to provide the necessary physical assistance at discharge.    Reasons goals not met: n/a  Recommendation:  Patient will benefit from ongoing skilled OT services in home health setting to continue to advance functional skills in the area of BADL, iADL, and Reduce care partner burden.  Equipment: No equipment provided  Reasons for discharge: treatment goals met and discharge from hospital  Patient/family agrees with progress made and goals achieved: Yes  OT Discharge Precautions/Restrictions  Precautions Precautions: Fall Recall of Precautions/Restrictions: Intact Restrictions Weight Bearing Restrictions Per Provider Order: No LLE Weight Bearing Per Provider Order: Weight bearing as tolerated General   Vital Signs Therapy Vitals Temp: 98.8 F (37.1 C) Pulse Rate: 80 Resp: 18 BP: (!) 125/56 Patient Position (if appropriate): Sitting Oxygen Therapy SpO2: 100 % O2 Device: Room Air Pain Pain Assessment Pain Scale: 0-10 Pain Score: 2  Pain Location: Knee Pain Descriptors / Indicators: Aching Pain Onset: Awakened from sleep ADL ADL Equipment Provided: Long-handled sponge Eating: Modified independent, Set up Where Assessed-Eating: Wheelchair, Bed level Grooming: Modified independent Where Assessed-Grooming: Sitting at sink Upper Body Bathing: Modified independent Where Assessed-Upper Body Bathing: Chair Lower Body Bathing: Modified independent Where Assessed-Lower Body Bathing: Chair Upper Body Dressing: Modified independent  (Device) Where Assessed-Upper Body Dressing: Chair Lower Body Dressing: Modified independent Where Assessed-Lower Body Dressing: Chair Toileting: Modified independent Where Assessed-Toileting: Teacher, Adult Education: Engineer, Agricultural Method: Proofreader: Engineer, Technical Sales: Modified independent Film/video Editor: Modified independent Film/video Editor Method: Designer, Industrial/product: Emergency planning/management officer, Grab bars ADL Comments: increased time for ADL tasks, pt would benefit from use of reacher for LB dressing Vision Baseline Vision/History: 1 Wears glasses Patient Visual Report: Blurring of vision Vision Assessment?: No apparent visual deficits;Wears glasses for reading;Wears glasses for driving Perception  Perception: Within Functional Limits Praxis Praxis: WFL Cognition Cognition Overall Cognitive Status: Within Functional Limits for tasks assessed Arousal/Alertness: Awake/alert Orientation Level: Person;Place;Situation Memory: Appears intact Awareness: Appears intact Problem Solving: Appears intact Safety/Judgment: Appears intact Brief Interview for Mental Status (BIMS) Repetition of Three Words (First Attempt): 3 Temporal Orientation: Year: Correct Temporal Orientation: Month: Accurate within 5 days Temporal Orientation: Day: Correct Recall: Sock: Yes, no cue required Recall: Blue: Yes, no cue required Recall: Bed: Yes, no cue required BIMS Summary Score: 15 Sensation Sensation Light Touch: Impaired by gross assessment Hot/Cold: Appears Intact Proprioception: Appears Intact Stereognosis: Not tested Coordination Gross Motor Movements are Fluid and Coordinated: No Fine Motor Movements are Fluid and Coordinated: Yes Finger Nose Finger Test: [facility] Motor  Motor Motor: Within Functional Limits Mobility  Bed Mobility Bed Mobility: Rolling Right;Rolling Left;Supine to Sit;Sit to  Supine Rolling Right: Independent with assistive device Rolling Left: Independent with assistive device Supine to Sit: Independent with assistive device Sit to Supine: Independent with assistive device Transfers Sit to Stand: Independent with assistive device Stand to Sit: Independent with assistive device  Trunk/Postural Assessment  Cervical Assessment Cervical Assessment: Within Functional Limits Thoracic Assessment Thoracic Assessment: Within Functional Limits Lumbar Assessment Lumbar Assessment: Exceptions to [facility] Postural Control Postural Control: Within Functional Limits  Balance Balance Balance Assessed: Yes Static Sitting Balance Static Sitting - Balance Support: Feet supported Static Sitting - Level of Assistance: 6: Modified independent (Device/Increase time) Dynamic Sitting Balance Dynamic Sitting - Balance Support: Feet supported Dynamic Sitting - Level of Assistance: 6: Modified independent (Device/Increase time) Static Standing Balance Static Standing - Balance Support: Bilateral upper extremity supported Static Standing - Level of Assistance: 6: Modified independent (Device/Increase time) Dynamic Standing Balance Dynamic Standing - Balance Support: Bilateral upper extremity supported Dynamic Standing - Level of Assistance: 6: Modified independent (Device/Increase time) Extremity/Trunk Assessment RUE Assessment RUE Assessment: Exceptions to [facility] Active Range of Motion (AROM) Comments: prior rotator cuff injury, ~100* shoulder flexion, WNL distal, has weighted utensils for eating d/t intention tremors General Strength Comments: 4-/5 LUE Assessment LUE Assessment: Exceptions to [facility] Active Range of Motion (AROM) Comments: WNL General Strength Comments: 4-/5 overall,has weighted utensils for eating d/t intention tremors   William Hatfield 02/08/2024, 8:04 AM

## 2024-02-08 NOTE — Progress Notes (Signed)
 Physical Therapy Discharge Summary  Patient Details  Name: William Hatfield MRN: [id number] Date of Birth: 08-24-1945  Date of Discharge from PT service:February 08, 2024   Patient has met 8 of 8 long term goals due to improved activity tolerance, improved balance, increased strength, increased range of motion, decreased pain, and ability to compensate for deficits.  Patient to discharge at an ambulatory level Modified Independent/supervision.   Patient's care partner is independent to provide the necessary physical assistance at discharge.  Recommendation:  Patient will benefit from ongoing skilled PT services in home health setting to continue to advance safe functional mobility, address ongoing impairments in L knee ROM, strength, standing balance, endurance, and minimize fall risk.  Equipment: No equipment provided  Reasons for discharge: treatment goals met and discharge from hospital  Patient/family agrees with progress made and goals achieved: Yes  PT Discharge Precautions/Restrictions Precautions Precautions: Fall Recall of Precautions/Restrictions: Intact Restrictions Weight Bearing Restrictions Per Provider Order: No LLE Weight Bearing Per Provider Order: Weight bearing as tolerated Pain Interference Pain Interference Pain Effect on Sleep: 2. Occasionally Pain Interference with Therapy Activities: 1. Rarely or not at all Pain Interference with Day-to-Day Activities: 1. Rarely or not at all Vision/Perception  Vision - History Ability to See in Adequate Light: 0 Adequate Perception Perception: Within Functional Limits Praxis Praxis: WFL  Cognition Overall Cognitive Status: Within Functional Limits for tasks assessed Arousal/Alertness: Awake/alert Memory: Appears intact Awareness: Appears intact Problem Solving: Appears intact Safety/Judgment: Appears intact Sensation Sensation Light Touch: Impaired by gross assessment Proprioception: Appears Intact Additional  Comments: N/T in med/lat L knee Coordination Gross Motor Movements are Fluid and Coordinated: No Motor  Motor Motor: Within Functional Limits  Mobility Bed Mobility Bed Mobility: Rolling Right;Rolling Left;Supine to Sit;Sit to Supine Rolling Right: Independent with assistive device Rolling Left: Independent with assistive device Supine to Sit: Independent with assistive device Sit to Supine: Independent with assistive device Transfers Transfers: Sit to Stand;Stand to Sit;Stand Pivot Transfers Sit to Stand: Independent with assistive device Stand to Sit: Independent with assistive device Stand Pivot Transfers: Independent with assistive device Transfer (Assistive device): Rolling walker Locomotion  Gait Ambulation: Yes Gait Assistance: Independent with assistive device;Supervision/Verbal cueing (Mod I for up to 50 ft; S for further dist) Gait Distance (Feet): 155 Feet Assistive device: Rolling walker Gait Gait: Yes Gait Pattern: Impaired Gait Pattern: Antalgic;Decreased step length - right;Decreased step length - left;Decreased stance time - left;Decreased hip/knee flexion - left;Decreased weight shift to left;Left flexed knee in stance;Wide base of support;Step-through pattern Gait velocity: decreased Stairs / Additional Locomotion Stairs: Yes Stairs Assistance: Contact Guard/Touching assist Stair Management Technique: Two rails Number of Stairs: 8 Height of Stairs: 6 Ramp: Supervision/Verbal cueing (RW) Curb: Contact Guard/Touching assist Pick up small object from the floor assist level: Independent with assistive device Pick up small object from the floor assistive device: reacher/RW Wheelchair Mobility Wheelchair Mobility: No  Trunk/Postural Assessment  Cervical Assessment Cervical Assessment: Within Functional Limits Thoracic Assessment Thoracic Assessment: Within Functional Limits Lumbar Assessment Lumbar Assessment: Exceptions to [employer] (posterior pelvic  tilt) Postural Control Postural Control: Within Functional Limits  Balance Balance Balance Assessed: Yes Static Sitting Balance Static Sitting - Balance Support: Feet supported Static Sitting - Level of Assistance: 6: Modified independent (Device/Increase time) Dynamic Sitting Balance Dynamic Sitting - Balance Support: Feet supported Dynamic Sitting - Level of Assistance: 6: Modified independent (Device/Increase time) Static Standing Balance Static Standing - Balance Support: Bilateral upper extremity supported Static Standing - Level of Assistance: 6: Modified independent (  Device/Increase time) Dynamic Standing Balance Dynamic Standing - Balance Support: Bilateral upper extremity supported Dynamic Standing - Level of Assistance: 6: Modified independent (Device/Increase time) Extremity Assessment  RLE Assessment RLE Assessment: Within Functional Limits General Strength Comments: 5/5 grossly knee/ankle; hip flex 4/5 LLE Assessment Active Range of Motion (AROM) Comments: liminted due to recent TKA; not formally measured General Strength Comments: DF/PF 4+/5; knee grossly 4+/5 within available range; hip flex 3+/5   [name], PT, DPT 02/08/2024, 12:21 PM

## 2024-02-08 NOTE — Progress Notes (Signed)
 Inpatient Rehabilitation Discharge Medication Review by a Pharmacist  A complete drug regimen review was completed for this patient to identify any potential clinically significant medication issues.  High Risk Drug Classes Is patient taking? Indication by Medication  Antipsychotic No   Anticoagulant Yes Apixaban  - PAF  Antibiotic No   Opioid Yes Oxycodone  - pain  Antiplatelet No   Hypoglycemics/insulin No   Vasoactive Medication Yes Metoprolol  - PAF, NSVT  Chemotherapy No   Other Yes Gerhardt's butt cream - barrier cream Melatonin - sleep maintenance Senna S - bowel regimen Atorvastatin  - HLD Docusate - bowel regimen Furosemide  - HF, fluid maintenance Methocarbamol  - muscle spasms APAP - pain Allopurinol  - gout Pantoprazole  - GERD PEG - bowel regimen     Type of Medication Issue Identified Description of Issue Recommendation(s)  Drug Interaction(s) (clinically significant)     Duplicate Therapy     Allergy     No Medication Administration End Date     Incorrect Dose     Additional Drug Therapy Needed     Significant med changes from prior encounter (inform family/care partners about these prior to discharge). STOP Imdur , Jardiance , Losartan  - BP maintained on Metop. STOP Bactrim  - completed course Follow-up outpatient and restart HF meds as able.  Other       Clinically significant medication issues were identified that warrant physician communication and completion of prescribed/recommended actions by midnight of the next day:  No  Name of provider notified for urgent issues identified:   Provider Method of Notification:     Pharmacist comments:   Time spent performing this drug regimen review (minutes):  15   Cindi Ghazarian, Suzen Acre 02/08/2024 1:19 PM

## 2024-02-08 NOTE — Progress Notes (Signed)
 Physical Therapy Session Note  Patient Details  Name: [name] MRN: [id number] Date of Birth: December 22, 1945  Today's Date: 02/08/2024 PT Individual Time: 1015-1112 PT Individual Time Calculation (min): 57 min   Short Term Goals: Week 1:  PT Short Term Goal 1 (Week 1): STG=LTG due to ELOS  Skilled Therapeutic Interventions/Progress Updates:     Pt seated in WC upon arrival. Pt reports 3-4/10 pain, declined pain meds. Agreeable to therapy. Session emphasized functional strengthening and endurance with ambulation, transfers, and stair negotiation. Pt transferred WC to EOB using RW with mod I. Pt performed supine <> sit and R/L rolling in bed with mod I. Pt amb from room to ortho gym using RW with supervision/mod I. Pt performed car transfer with supervision. Pt performed asc/desc 10 ft ramp using RW with supervision. Pt asc/desc 8-6 inch steps using B HR with CGA and VC for sequencing. Pt amb back to room from main gym using RW with supervision/mod I. Occasional seated rest breaks required throughout session due to fatigue/pain. All questions addressed/answered. Pt remained seated in WC and all needs in reach at end of session.  Therapy Documentation Precautions:  Precautions Precautions: Fall, Knee Precaution/Restrictions Comments: Reviewed importance of terminal knee extension, elevation in extension  no pillow behind knee. Restrictions Weight Bearing Restrictions Per Provider Order: No LLE Weight Bearing Per Provider Order: Weight bearing as tolerated Other Position/Activity Restrictions: WBAT  Therapy/Group: Individual Therapy  [name], PT, DPT 02/08/2024, 7:21 AM

## 2024-02-08 NOTE — Progress Notes (Signed)
 PROGRESS NOTE   Subjective/Complaints: No new complaints or concerns this morning.  Reports pain is overall under control.  Scheduled for discharge tomorrow.  LBM yesterday.  ROS: Patient denies fever, rash, sore throat, blurred vision,  nausea, vomiting,  cough, shortness of breath or chest pain,   headache, or mood change.  + Indigestion-improved + insomnia  + leg pain- controlled    Objective:   No results found. Recent Labs    02/07/24 0554  WBC 10.5  HGB 10.5*  HCT 32.8*  PLT 344    Recent Labs    02/07/24 0554  NA 133*  K 4.1  CL 102  CO2 22  GLUCOSE 99  BUN 34*  CREATININE 1.34*  CALCIUM  8.9    Intake/Output Summary (Last 24 hours) at 02/08/2024 1144 Last data filed at 02/08/2024 0856 Gross per 24 hour  Intake 840 ml  Output 1525 ml  Net -685 ml     Wound 01/30/24 1425 Pressure Injury Buttocks Right Stage 2 -  Partial thickness loss of dermis presenting as a shallow open injury with a red, pink wound bed without slough. (Active)    Physical Exam: Vital Signs Blood pressure (!) 139/52, pulse 66, temperature 98.8 F (37.1 C), resp. rate 18, height 5' 11 (1.803 m), weight 86.2 kg, SpO2 100%.  Constitutional: No distress . Vital signs reviewed.  Sitting in WC HEENT: NCAT, EOMI, oral membranes moist Neck: supple Cardiovascular: RRR without murmur. No JVD    Respiratory/Chest: CTA Bilaterally without wheezes or rales. Normal effort    GI/Abdomen: BS +, non-tender, non-distended Ext: no clubbing, cyanosis, 1 to 2+ bilateral lower extremity edema Psych: pleasant and cooperative  Skin: Visible surfaces intact  incision CDI with steristrips-looks okay. Right buttock wound       Neuro:  Alert and oriented x 3. Normal insight and awareness. Intact Memory. Normal language and speech. Cranial nerve exam unremarkable.  MMT: Bilateral upper extremities 5 out of 5. RLE 4 to 5/5. LLE 3-4/5 . Sensory  exam normal for light touch and pain in all 4 limbs.  No limb ataxia or cerebellar signs. No abnormal tone appreciated.  .   + BL UE intention tremor  Prior neuro assessment is c/w today's exam 02/08/2024.   Musculoskeletal: Passive flexion to about 90 degrees.  Left knee swelling present.   Assessment/Plan: 1. Functional deficits which require 3+ hours per day of interdisciplinary therapy in a comprehensive inpatient rehab setting. Physiatrist is providing close team supervision and 24 hour management of active medical problems listed below. Physiatrist and rehab team continue to assess barriers to discharge/monitor patient progress toward functional and medical goals  Care Tool:  Bathing    Body parts bathed by patient: Right arm, Left arm, Chest, Abdomen, Front perineal area, Buttocks, Right upper leg, Left upper leg, Right lower leg, Left lower leg, Face         Bathing assist Assist Level: Independent with assistive device     Upper Body Dressing/Undressing Upper body dressing   What is the patient wearing?: Pull over shirt    Upper body assist Assist Level: Independent with assistive device    Lower Body Dressing/Undressing Lower body  dressing      What is the patient wearing?: Underwear/pull up, Pants     Lower body assist Assist for lower body dressing: Independent with assitive device     Toileting Toileting    Toileting assist Assist for toileting: Independent with assistive device     Transfers Chair/bed transfer  Transfers assist     Chair/bed transfer assist level: Independent with assistive device     Locomotion Ambulation   Ambulation assist      Assist level: Supervision/Verbal cueing Assistive device: Walker-rolling Max distance: 155   Walk 10 feet activity   Assist     Assist level: Independent with assistive device Assistive device: Walker-rolling   Walk 50 feet activity   Assist Walk 50 feet with 2 turns activity  did not occur: Safety/medical concerns (pain/weakness/fatigue)  Assist level: Independent with assistive device Assistive device: Walker-rolling    Walk 150 feet activity   Assist Walk 150 feet activity did not occur: Safety/medical concerns (pain/weakness/fatigue)  Assist level: Supervision/Verbal cueing Assistive device: Walker-rolling    Walk 10 feet on uneven surface  activity   Assist Walk 10 feet on uneven surfaces activity did not occur: Safety/medical concerns (pain/weakness/fatigue)   Assist level: Supervision/Verbal cueing Assistive device: Walker-rolling   Wheelchair     Assist Is the patient using a wheelchair?: No Type of Wheelchair: Manual    Wheelchair assist level: Contact Guard/Touching assist Max wheelchair distance: 75 ft    Wheelchair 50 feet with 2 turns activity    Assist        Assist Level: Contact Guard/Touching assist   Wheelchair 150 feet activity     Assist      Assist Level: Moderate Assistance - Patient 50 - 74%   Blood pressure (!) 139/52, pulse 66, temperature 98.8 F (37.1 C), resp. rate 18, height 5' 11 (1.803 m), weight 86.2 kg, SpO2 100%.  Medical Problem List and Plan: 1. 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   weightbearing as tolerated             -patient may shower             -ELOS/Goals: 8-12 days modI             -12/5 Continue CIR therapies including PT, OT   -TEDS for LE edema. Discussed use of TEDs and leg elevation-appears a little improved  - Discharge home tomorrow  2.  Antithrombotics: -DVT/anticoagulation:  Pharmaceutical: Eliquis .  Venous Doppler studies nuclear medicine perfusion scan negative             -antiplatelet therapy: N/A   3. Pain Management: continue Robaxin  and oxycodone  as needed  -Pain overall under control, he tries to avoid taking pain medications  -Discussed Journavx-patient is not interested in any pain  medications     4. Mood/Behavior/Sleep: Provide emotional support             -antipsychotic agents: N/A  - 12/8 melatonin for insomnia  -12/9 trazodone  PRN- pt asks for this not to be scheduled for sleep  -12/10 Pt did not use prn trazadone last night- monitor   5. Neuropsych/cognition: This patient is capable of making decisions on his own behalf.   6. Skin/Wound Care/pressure injury buttocks:   - Continue local wound care for buttocks-appears stable   -staples out. Keep incision clean and dry  7. Fluids/Electrolytes/Nutrition: Routine in and outs with follow-up chemistries  12/6: ?why on fluid restriction - defer to primary team  8.  Leukocytosis/HCAP.  Completing 7-day course cefepime  as well as Bactrim .  MRSA PCR negative   12/4 wbcs down to 15.2k---> recheck Monday   12/11 WBC down to 10.5, within normal range  9.  Elevated troponin/PAF/PVCs/NSVT with history of CHB status post PPM.  Continue chronic Eliquis  as well as Lopressor  25 mg twice daily.  Imdur  currently on hold.  Follow-up cardiology services.   Elevated troponin felt to be related to demand  ischemia as well as related to pneumonia.   10.  CAD/CABG 2006.CHF.  Followed by cardiology services    - EF 50-55%  12/6: Monitor vitals with IVF, consider CXR in AM if any desats/sob  12-7: Fluid resuscitation limited by increasing edema.  Appears Lasix  40 milligrams was resumed this morning, will DC for tomorrow, may just be overdiuresed and need a few days before resuming/adjusting dose.  Courage patient to wear TED hose  -daily weight  12/10 Rolan Pitch spoke with cardiology HF team, resume lasix  hold losartan .   12/11 leg swelling appears similar to yesterday, continue leg elevation, Lasix  and compression  12/12 weight a little higher today but swelling appears slightly improved continue to monitor [facility] Weights   01/30/24 1739 01/30/24 1740 02/06/24 1405  Weight: 84.9 kg 84.9 kg 86.2 kg      02/08/2024   10:28 AM  02/08/2024    4:50 AM 02/07/2024    8:10 PM  Vitals with BMI  Systolic 139 125 877  Diastolic 52 56 65  Pulse 66 80 80      11.  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  Follow-up chemistries.  Losartan  and Jardiance  currently on hold   12/4 Cr with bump to 1.4 today. Will recheck tomorrow to make sure there is no progression. 12/5 dizzy today, Cr continues to creep up, BUN still elevated.   -will hold lasix  tomorrow  -push fluids today  -repeat BMET in am.  12/6: Creatinine up to 1.71, BUN 37; PO fluids very poor; will give IV fluids through today and 75 cc/h and repeat in AM.  12-7: BUN/creatinine slightly improved to 1.6, however significant peripheral edema with IV fluid.  Will likely increase again tomorrow with Lasix , hold a.m. dose Lasix , encourage p.o. fluids. 12-8 BUN and creatinine 30/1.59, continue current regimen for now 12/11 BUN and creatinine overall stable, creatinine is better at 1.34, BUN a little higher at 34 12.  Hyperlipidemia: continue Lipitor   13.  Constipation: continue MiraLAX  daily, Senokot -S 2 tablets nightly  12/5 -had multiple BM's last night  LBM 12/5, hard  11/10 LBM today  11/11 decrease Senokot to 1 tablet nightly for loose bowel movements reported  11/12 LBM yesterday continue current regimen and monitor  14. Post-op anemia/?ACD  -12/8 hemoglobin stable at 10.6  -12/11 hemoglobin stable 10.5  15.  Indigestion   - Continue PPI twice daily, Maalox available as needed  -12/9 reports improved, discussed maalox PRN  LOS: 9 days A FACE TO FACE EVALUATION WAS PERFORMED  Murray Collier 02/08/2024, 11:44 AM

## 2024-02-08 NOTE — Progress Notes (Signed)
 Inpatient Rehabilitation Care Coordinator Discharge Note   Patient Details  Name: William Hatfield MRN: [id number] Date of Birth: 09-15-1945   Discharge location: Home alone with daughter support  Length of Stay: 9 days  Discharge activity level: Independent with assistive device  Home/community participation: Limited activty in the communit  Patient response un:Yzjouy Literacy - How often do you need to have someone help you when you read instructions, pamphlets, or other written material from your doctor or pharmacy?: Never  Patient response un:Dnrpjo Isolation - How often do you feel lonely or isolated from those around you?: Never  Services provided included: MD, RD, PT, OT, SLP, RN, CM, TR, Pharmacy, SW  Financial Services:  Financial Services Utilized: Medicare (Part A and B)    Choices offered to/list presented to: Patient/Daughter  Follow-up services arranged:  Home Health Home Health Agency: [facility] for PT/OT         Patient response to transportation need: Is the patient able to respond to transportation needs?: Yes In the past 12 months, has lack of transportation kept you from medical appointments or from getting medications?: No In the past 12 months, has lack of transportation kept you from meetings, work, or from getting things needed for daily living?: No   Patient/Family verbalized understanding of follow-up arrangements:  Yes  Individual responsible for coordination of the follow-up plan: Patient/Daughter  Confirmed correct DME delivered: William Hatfield 02/08/2024    Comments (or additional information): Patient will return home alone with daughter, [name] providing support. [name] will provide transportation home. HH with [facility] reinstated for PT/OT. No DME needs because he has all recommended DME in the home.   Summary of Stay    Date/Time Discharge Planning CSW  02/06/24 1809 Will discharge home alone. Daughter to provide care and support as needed.  [facility] to be reinstated following discharge for PT/OT services. No DME needs. DS  02/06/24 0840 Will discharge home alone. Daughter to provide care and support. [facility] to be reinstated following discharge for PT/OT services. Awaiting DME recommendations. DS       William Hatfield

## 2024-02-08 NOTE — Progress Notes (Signed)
 Lorilee Sven SQUIBB, MD  Physician Physical Medicine and Rehabilitation   PMR Pre-admission    Signed   Date of Service: 01/29/2024  9:12 AM  Related encounter: ED to Hosp-Admission (Discharged) from 01/22/2024 in [facility]   Signed     Expand All Collapse All  PMR Admission Coordinator Pre-Admission Assessment   Patient: William Hatfield is an 78 y.o., male MRN: [id number] DOB: 1945/03/13 Height: 5' 11 (180.3 cm) Weight: 84.9 kg   Insurance Information HMO:     PPO:      PCP:      IPA:      80/20: yes     OTHER:  PRIMARY:  Medicare Part A and B      Policy#:  [id number]              Subscriber: Pt CM Name:       Phone#:      Fax#:  Pre-Cert#:       Employer:  Benefits:  Phone #:      Name:  Eff. Date: A and B effective 04/27/12     Deduct: $1,676   Pocket Max: NA      Life Max: Na CIR: 100% coverage      SNF: 100% coverage for days 1-20, 80% coverage for days 21-100 Outpatient: 80% coverage     Co-Pay: 20% Home Health: 100% coverage      Co-Pay:  DME: 80% coverage     Co-Pay: 20% Providers: pt's choice  SECONDARY: AARP      Policy#: [id number]      Phone#:    Financial Counselor:       Phone#:    The Data Collection Information Summary for patients in Inpatient Rehabilitation Facilities with attached Privacy Act Statement-Health Care Records was provided and verbally reviewed with:    Emergency Contact Information Contact Information       Name Relation Home Work Mobile    [place] Daughter     [phone]    [facility] Daughter     [phone]         Other Contacts   None on File        Current Medical History  Patient Admitting Diagnosis: PNA, debility History of Present Illness: Dimitrius Steedman is a 78 year old right-handed male with history significant for hypertension, CHB status post PPM 2021, CAD status post CABG at [facility] 2006, atrial fibrillation maintained on Eliquis , CKD stage III, nonessential tremor  question preparkinsonian, back surgery x 2 with fusion, left carotid enterectomy 05/22/2022 per Dr. Fonda Simpers and TCAR of the right ICA 8/24, as well as recent left knee total arthroplasty 01/16/2024 per Dr. Reyes Billing.  Per chart review patient lives alone.  1 level home with level entry.  Ambulates without assistive device and still drives.  Patient has been using a rolling walker since recent knee surgery.  His daughter plans to assist as needed.  Presented to [facility] 01/22/2024 with fever, nausea, fatigue and left leg redness with swelling over 48 hours.  In the ED patient afebrile saturating well on room air labs most notable for creatinine 1.71 WBC 13,200, ALT 63, alkaline phosphatase 206, normal lactic acid and hemoglobin 10.9, urinalysis negative, troponin 348-350, D-dimer 1.49.  Chest x-ray opacity at the left midlung which could reflect atelectasis and/or developing bronchial pneumonia.  Plain radiographs of the left leg revealed soft tissue swelling and well aligned knee arthroplasty without acute fracture or dislocation, blood cultures  no growth to date.  Patient was initially placed on empiric antibiotic coverage and currently completing a 7-day course of Maxipime  for suspect left lower extremity cellulitis/HCAP.  He continued to have some leukocytosis 15,400.  Venous Doppler studies negative for DVT at the left lower extremity.  CT of the chest findings consistent with multifocal pneumonia with patchy infiltrates in the upper lungs and left lingula and basilar atelectasis or consolidation greater on the left.  Small bilateral pleural effusions..  Nuclear medicine pulmonary perfusion scan showed no pulmonary emboli.  He remains on chronic Eliquis  therapy.  Cardiology service follow-up for elevated troponin felt to be related to demand ischemia versus related to pneumonia.  Echocardiogram showed ejection fraction of 50 to 55% no wall motion abnormalities.  Patient did have some  persistent dyspnea with minimal exertion possibly related to pneumonia and/or deconditioning with infrequent PVCs on beta-blocker he did receive Lasix  40 mg IV x 1 01/28/2024.  BP remains soft in the 90s with ambulation.  Renal function remained stable to latest creatinine 1.12 with Jardiance  and losartan  currently on hold.  Therapies have been initiated patient is weightbearing as tolerated left lower extremity.  Patient was admitted for a comprehensive rehab program.    Patient's medical record from [facility]  has been reviewed by the rehabilitation admission coordinator and physician.   Past Medical History      Past Medical History:  Diagnosis Date   Anginal pain     Arthritis     Atrial fibrillation (HCC)      a. post op in 2006  b. recurrence on 08/2016 admission for NSTEMI   CAD (coronary artery disease)      a. 2006: s/p CABG at [facility] with LIMA to LAD, SVG to PDA, SVG to OM1, SVG to D1  b. 08/2016: NSTEMI 09/18/16 which showed severe native 3V CAD with patent SVG-->PLA, LIMA-->LAD and total occlusion of SVG--> OM1 with unsuccessful attempt at PCI of SVG--> OM with inability to restore flow.    Cancer (HCC)      Rt. arm,head squamus.   Carotid artery disease     CKD (chronic kidney disease)     Dyspnea      With exertion   Dysrhythmia      3rd Degree Block - PPM placed   Essential hypertension     Myocardial infarction (HCC) 09/18/2016   Peripheral vascular disease     Pneumonia 2020    COVID PNA   Presence of permanent cardiac pacemaker     Tremor            Has the patient had major surgery during 100 days prior to admission? No   Family History   family history includes Heart attack in his father and paternal grandfather.   Current Medications Current Medications    Current Facility-Administered Medications:    acetaminophen  (TYLENOL ) tablet 650 mg, 650 mg, Oral, Q6H PRN, 650 mg at 01/28/24 1107 **OR** acetaminophen  (TYLENOL ) suppository 650 mg, 650 mg,  Rectal, Q6H PRN, Opyd, Evalene RAMAN, MD   allopurinol  (ZYLOPRIM ) tablet 100 mg, 100 mg, Oral, Q0600, Opyd, Timothy S, MD, 100 mg at 01/29/24 0503   alum & mag hydroxide-simeth (MAALOX/MYLANTA) 200-200-20 MG/5ML suspension 30 mL, 30 mL, Oral, Q6H PRN, Rojelio Nest, DO, 30 mL at 01/26/24 2337   apixaban  (ELIQUIS ) tablet 5 mg, 5 mg, Oral, BID, Hilty, Vinie BROCKS, MD, 5 mg at 01/29/24 9191   atorvastatin  (LIPITOR) tablet 40 mg, 40 mg, Oral, QPM, Opyd, Evalene  S, MD, 40 mg at 01/28/24 1732   bisacodyl  (DULCOLAX) suppository 10 mg, 10 mg, Rectal, Daily PRN, Rojelio Nest, DO, 10 mg at 01/27/24 1327   ceFEPIme  (MAXIPIME ) 2 g in sodium chloride  0.9 % 100 mL IVPB, 2 g, Intravenous, Q12H, [place], Ellouise SQUIBB, MD, Stopping previously hung infusion at 01/29/24 0809   docusate sodium  (COLACE) capsule 100 mg, 100 mg, Oral, BID, Rojelio Nest, DO, 100 mg at 01/29/24 9191   HYDROmorphone  (DILAUDID ) injection 1 mg, 1 mg, Intravenous, Q4H PRN, Rojelio Nest, DO, 1 mg at 01/27/24 1216   methocarbamol  (ROBAXIN ) tablet 500 mg, 500 mg, Oral, Q6H PRN, Opyd, Timothy S, MD, 500 mg at 01/28/24 2357   metoprolol  tartrate (LOPRESSOR ) tablet 25 mg, 25 mg, Oral, BID, Opyd, Timothy S, MD, 25 mg at 01/29/24 0808   ondansetron  (ZOFRAN ) injection 4 mg, 4 mg, Intravenous, Q6H PRN, Rojelio Nest, DO, 4 mg at 01/27/24 1521   oxyCODONE  (Oxy IR/ROXICODONE ) immediate release tablet 5 mg, 5 mg, Oral, Q4H PRN, Opyd, Timothy S, MD, 5 mg at 01/29/24 0505   polyethylene glycol (MIRALAX  / GLYCOLAX ) packet 17 g, 17 g, Oral, Daily, Rojelio Nest, DO, 17 g at 01/29/24 9191   senna (SENOKOT) tablet 8.6 mg, 1 tablet, Oral, QHS, Rojelio Nest, DO, 8.6 mg at 01/28/24 2021   sodium chloride  flush (NS) 0.9 % injection 3 mL, 3 mL, Intravenous, Q12H, Opyd, Timothy S, MD, 3 mL at 01/29/24 0809   trimethobenzamide  (TIGAN ) injection 200 mg, 200 mg, Intramuscular, Q6H PRN, Opyd, Timothy S, MD, 200 mg at 01/27/24 9048      Patients Current Diet:  Diet  Order                  Diet regular Room service appropriate? Yes; Fluid consistency: Thin; Fluid restriction: 2000 mL Fluid  Diet effective now                         Precautions / Restrictions Precautions Precautions: Fall, Knee Precaution/Restrictions Comments: Reviewed importance of terminal knee extension, elevation in extension  no pillow behind knee Restrictions Weight Bearing Restrictions Per Provider Order: No LLE Weight Bearing Per Provider Order: Weight bearing as tolerated    Has the patient had 2 or more falls or a fall with injury in the past year? No   Prior Activity Level Community (5-7x/wk): Active in the community   Prior Functional Level Self Care: Did the patient need help bathing, dressing, using the toilet or eating? Independent   Indoor Mobility: Did the patient need assistance with walking from room to room (with or without device)? Independent   Stairs: Did the patient need assistance with internal or external stairs (with or without device)? Independent   Functional Cognition: Did the patient need help planning regular tasks such as shopping or remembering to take medications? Independent   Patient Information Are you of Hispanic, Latino/a,or Spanish origin?: A. No, not of Hispanic, Latino/a, or Spanish origin What is your race?: A. White Do you need or want an interpreter to communicate with a doctor or health care staff?: 0. No   Patient's Response To:  Health Literacy and Transportation Is the patient able to respond to health literacy and transportation needs?: Yes Health Literacy - How often do you need to have someone help you when you read instructions, pamphlets, or other written material from your doctor or pharmacy?: Never In the past 12 months, has lack of transportation kept you from medical appointments  or from getting medications?: No In the past 12 months, has lack of transportation kept you from meetings, work, or from getting  things needed for daily living?: No   Journalist, Newspaper / Equipment Home Equipment: Rollator (4 wheels), Cane - single point, Agricultural Consultant (2 wheels)   Prior Device Use: Indicate devices/aids used by the patient prior to current illness, exacerbation or injury? None of the above   Current Functional Level Cognition   Orientation Level: Oriented X4    Extremity Assessment (includes Sensation/Coordination)   Upper Extremity Assessment: Overall WFL for tasks assessed  LLE Deficits / Details: ankle WFL, knee extension 2+/5,  hip flexion 2+/5, ~10 degree quad lag. MMT limited by pain     ADLs   Overall ADL's : Needs assistance/impaired Eating/Feeding: Set up, Sitting Grooming: Set up, Sitting Upper Body Bathing: Minimal assistance, Sitting Lower Body Bathing: Sit to/from stand, Maximal assistance Upper Body Dressing : Minimal assistance, Sitting Lower Body Dressing: Sit to/from stand, Cueing for sequencing, Cueing for safety, Maximal assistance Toilet Transfer: Moderate assistance Toilet Transfer Details (indicate cue type and reason): bed to chair Toileting- Clothing Manipulation and Hygiene: Moderate assistance, Sit to/from stand, Sitting/lateral lean     Mobility   Overal bed mobility: Needs Assistance Bed Mobility: Supine to Sit Supine to sit: Contact guard, HOB elevated, Used rails Sit to supine: Contact guard assist, HOB elevated, Used rails General bed mobility comments: Increased time and effort. Pt practiced using gait belt as leg lifter to assist L LE. Cues provided as needed.     Transfers   Overall transfer level: Needs assistance Equipment used: Rolling walker (2 wheels) Transfers: Sit to/from Stand Sit to Stand: Min assist Bed to/from chair/wheelchair/BSC transfer type:: Stand pivot Stand pivot transfers: Min assist Step pivot transfers: Min assist General transfer comment: Cues for safety, technique, hand/LE placement, and for pt to try to get L heel down  on floor. Assist needed to power up, stabilize, posiiton L LE, control descent. Increased time and effort for pt. L knee in flexed position. Fall risk. Moderate dyspnea and significant pain reported by pt.     Ambulation / Gait / Stairs / Wheelchair Mobility   Ambulation/Gait Ambulation/Gait assistance: Min assist, +2 safety/equipment Gait Distance (Feet): 10 Feet (10'x1; 5'x1) Assistive device: Rolling walker (2 wheels) Gait Pattern/deviations: Step-to pattern General Gait Details: Cues for safety, technique, sequencing, heel to floor on L. Assist to stabilize pt throughout short distance. Followed closely with recliner. Multiple seated rest breaks taken 2* fatigue, dyspnea, dizziness, fatigue, pain.  O2 96% on RA, HR 54 bpm, BP 99/54     Posture / Balance Balance Overall balance assessment: Needs assistance Sitting-balance support: No upper extremity supported, Feet supported Sitting balance-Leahy Scale: Fair Standing balance support: Bilateral upper extremity supported, During functional activity, Reliant on assistive device for balance Standing balance-Leahy Scale: Poor Standing balance comment: unable     Special considerations/life events  Special service needs IV ABX    Previous Home Environment (from acute therapy documentation) Living Arrangements: Alone Available Help at Discharge: Family, Available 24 hours/day Type of Home: House Home Layout: One level Home Access: Level entry Bathroom Shower/Tub: Engineer, Manufacturing Systems: Handicapped height Bathroom Accessibility: No Home Care Services: Yes Type of Home Care Services: Home OT, Home PT Additional Comments: 2 daughters to assist, one is CHARITY FUNDRAISER   Discharge Living Setting Plans for Discharge Living Setting: House Type of Home at Discharge: House Discharge Home Layout: One level Discharge Home Access: Level entry  Discharge Bathroom Shower/Tub: Tub/shower unit Discharge Bathroom Toilet: Handicapped height Discharge  Bathroom Accessibility: Yes How Accessible: Accessible via walker Does the patient have any problems obtaining your medications?: No   Social/Family/Support Systems Patient Roles: Other (Comment) Contact Information: [phone] Anticipated Caregiver: [name] (daughter) Caregiver Availability: 24/7 Discharge Plan Discussed with Primary Caregiver: Yes Is Caregiver In Agreement with Plan?: Yes   Goals Patient/Family Goal for Rehab: PT/OT Supervisoin Expected length of stay: 10-12 days Pt/Family Agrees to Admission and willing to participate: Yes Program Orientation Provided & Reviewed with Pt/Caregiver Including Roles  & Responsibilities: Yes   Decrease burden of Care through IP rehab admission: Not anticipated   Possible need for SNF placement upon discharge: Not anticipated   Patient Condition: I have reviewed medical records from [facility], spoken with CM, and patient and daughter. I discussed via phone for inpatient rehabilitation assessment.  Patient will benefit from ongoing PT and OT, can actively participate in 3 hours of therapy a day 5 days of the week, and can make measurable gains during the admission.  Patient will also benefit from the coordinated team approach during an Inpatient Acute Rehabilitation admission.  The patient will receive intensive therapy as well as Rehabilitation physician, nursing, social worker, and care management interventions.  Due to safety, skin/wound care, disease management, medication administration, pain management, and patient education the patient requires 24 hour a day rehabilitation nursing.  The patient is currently min A with mobility and basic ADLs.  Discharge setting and therapy post discharge at home with home health is anticipated.  Patient has agreed to participate in the Acute Inpatient Rehabilitation Program and will admit today.   Preadmission Screen Completed By:  Leita KATHEE Kleine, 01/29/2024 9:12  AM ______________________________________________________________________   Discussed status with Dr. Lorilee  on 01/30/24 at 1036 and received approval for admission today.   Admission Coordinator:  Leita KATHEE Kleine RAEJEAN, time  8963 Date 01/30/24    Assessment/Plan: Diagnosis: Debility 2/2 pneumonia Does the need for close, 24 hr/day Medical supervision in concert with the patient's rehab needs make it unreasonable for this patient to be served in a less intensive setting? Yes Co-Morbidities requiring supervision/potential complications: HTN, CAD, atrial fibrillation, CKD stage 3, nonessential tremor Due to bladder management, bowel management, safety, skin/wound care, disease management, medication administration, pain management, and patient education, does the patient require 24 hr/day rehab nursing? Yes Does the patient require coordinated care of a physician, rehab nurse, PT, OT to address physical and functional deficits in the context of the above medical diagnosis(es)? Yes Addressing deficits in the following areas: balance, endurance, locomotion, strength, transferring, bowel/bladder control, bathing, dressing, feeding, grooming, toileting, and psychosocial support Can the patient actively participate in an intensive therapy program of at least 3 hrs of therapy 5 days a week? Yes The potential for patient to make measurable gains while on inpatient rehab is excellent Anticipated functional outcomes upon discharge from inpatient rehab: modified independent PT, modified independent OT, independent SLP Estimated rehab length of stay to reach the above functional goals is: 8-12 days Anticipated discharge destination: Home 10. Overall Rehab/Functional Prognosis: excellent     MD Signature: Sven Lorilee, MD          Revision History  Date/Time User Provider Type Action  01/30/2024 10:57 AM Lorilee, Sven SQUIBB, MD Physician Sign  01/30/2024 10:40 AM Kleine Leita KATHEE, CCC-SLP Rehab  Admission Coordinator Share  01/30/2024 10:37 AM Kleine Leita KATHEE, CCC-SLP Rehab Admission Coordinator Share  01/29/2024  9:16 AM Staley,  Leita KATHEE OVERMAN Rehab Admission Coordinator Share   View Details Report

## 2024-02-08 NOTE — Progress Notes (Signed)
 Occupational Therapy Session Note  Patient Details  Name: Macari Zalesky MRN: [id number] Date of Birth: Jul 03, 1945  Today's Date: 02/08/2024 OT Individual Time: 8646-8496 OT Individual Time Calculation (min): 70 min    Short Term Goals: Week 1:  OT Short Term Goal 1 (Week 1): STG=LTG d/t ELOS  Skilled Therapeutic Interventions/Progress Updates:  Skilled OT intervention completed with focus on functional ambulation, endurance, sit > stands, dynamic balance. Pt received seated in w/c, agreeable to session. 3/10 pain reported in L knee with activity but improved with rest; declined pain meds. OT offered rest breaks and repositioning throughout for pain reduction.  Pt declined self-care needs and no further DC planning needs expressed. Completed all sit > stands and ambulatory transfers with close supervision and RW. Pt WBAT on LLE, tolerating full weight appropriately. Intermittent rest needed for fatigue.  Ambulated <> gym. Pt participated in the following dynamic standing balance and endurance tasks to promote independence and safety during BADLs and functional mobility: -placing and retrieving clips from long mirror. 1 sit > stand per clip x2 (20 clips) with RW on R lateral side used as stabilizer intermittently only, and CGA/supervision needed for balance  Pt completed the following intervals on nustep utilizing pace partner for sustained pace, to promote gentle L knee ROM needed for independence with BADLs and functional mobility: -10 mins, level 1 (for ROM only)  Pt remained in w/c, with ice applied to L knee per request, and with all needs in reach at end of session.   Therapy Documentation Precautions:  Precautions Precautions: Fall Recall of Precautions/Restrictions: Intact Precaution/Restrictions Comments: Reviewed importance of terminal knee extension, elevation in extension  no pillow behind knee. Restrictions Weight Bearing Restrictions Per Provider Order: No LLE Weight  Bearing Per Provider Order: Weight bearing as tolerated Other Position/Activity Restrictions: WBAT    Therapy/Group: Individual Therapy  Lorrayne FORBES Fritter, MS, OTR/L  02/08/2024, 3:40 PM

## 2024-02-08 NOTE — Progress Notes (Signed)
 Wound supplies given to patient. Nurse to educate daughter re: wound dressing changes.

## 2024-02-08 NOTE — Progress Notes (Signed)
 Occupational Therapy Session Note  Patient Details  Name: William Hatfield MRN: [id number] Date of Birth: Jun 23, 1945  Today's Date: 02/08/2024 OT Individual Time: 9269-9169 OT Individual Time Calculation (min): 60 min    Short Term Goals: Week 1:  OT Short Term Goal 1 (Week 1): STG=LTG d/t ELOS  Skilled Therapeutic Interventions/Progress Updates:  Patient agreeable to participate in OT session. Reports 2/10 pain level.   Patient participated in skilled OT session focusing on bathing and dressing, dc planning, dynamic standing balance, and functional mobility. Patient received in room finishing breakfast at EOB/ discussed d/c plans, follow up services, need for continued mobility and independence to maintain current functional levels. Patient requested to complete B & D. Patient then able to complete all aspects of B&D session with mod I. Patient demonstrated good dynamic balance with navigating items in the bathroom, LB doffing and donning in standing, and gathering of items with reaching forward and to the side. Patient then completed dressing in chair with mod I. Able to complete socks with mod I however required increased assist for ted hose due to strength and tremors. Patient then returned to chair all needs in reach.     Therapy Documentation Precautions:  Precautions Precautions: Fall, Knee Precaution/Restrictions Comments: Reviewed importance of terminal knee extension, elevation in extension  no pillow behind knee. Restrictions Weight Bearing Restrictions Per Provider Order: No LLE Weight Bearing Per Provider Order: Weight bearing as tolerated Other Position/Activity Restrictions: WBAT Therapy/Group: Individual Therapy  [name] 02/08/2024, 7:35 AM

## 2024-02-09 ENCOUNTER — Other Ambulatory Visit (HOSPITAL_COMMUNITY): Payer: Self-pay

## 2024-02-09 DIAGNOSIS — J189 Pneumonia, unspecified organism: Secondary | ICD-10-CM | POA: Diagnosis not present

## 2024-02-09 DIAGNOSIS — K5901 Slow transit constipation: Secondary | ICD-10-CM | POA: Diagnosis not present

## 2024-02-09 NOTE — Progress Notes (Signed)
 PROGRESS NOTE   Subjective/Complaints:  Pt doing well, ready to go home. Slept poorly but excited to sleep in his bed tonight. Pain doing fine. LBM last night. Urinating fine. No other complaints or concerns.   ROS: as per HPI. Denies CP, SOB, abd pain, N/V/D/C, or any other complaints at this time.   + Indigestion-improved + insomnia  + leg pain- controlled    Objective:   No results found. Recent Labs    02/07/24 0554  WBC 10.5  HGB 10.5*  HCT 32.8*  PLT 344    Recent Labs    02/07/24 0554  NA 133*  K 4.1  CL 102  CO2 22  GLUCOSE 99  BUN 34*  CREATININE 1.34*  CALCIUM  8.9    Intake/Output Summary (Last 24 hours) at 02/09/2024 1001 Last data filed at 02/09/2024 0747 Gross per 24 hour  Intake 1315 ml  Output 900 ml  Net 415 ml     Wound 01/30/24 1425 Pressure Injury Buttocks Right Stage 2 -  Partial thickness loss of dermis presenting as a shallow open injury with a red, pink wound bed without slough. (Active)    Physical Exam: Vital Signs Blood pressure (!) 114/57, pulse 71, temperature (!) 97.4 F (36.3 C), temperature source Oral, resp. rate 16, height 5' 11 (1.803 m), weight 86.2 kg, SpO2 99%.  Constitutional: No distress . Vital signs reviewed.  Sitting in WC HEENT: NCAT, EOMI, oral membranes moist Neck: supple Cardiovascular: RRR without murmur. No JVD    Respiratory/Chest: CTA Bilaterally without wheezes or rales. Normal effort    GI/Abdomen: BS +, non-tender, non-distended, soft Ext: no clubbing, cyanosis, 1+ bilateral lower extremity edema, L knee incision c/d/i Psych: pleasant and cooperative  Skin: Visible surfaces intact aside from L knee incision CDI with steristrips-looks okay. Right buttock wound not reassessed   PRIOR EXAMS:       Neuro:  Alert and oriented x 3. Normal insight and awareness. Intact Memory. Normal language and speech. Cranial nerve exam unremarkable.   MMT: Bilateral upper extremities 5 out of 5. RLE 4 to 5/5. LLE 3-4/5 . Sensory exam normal for light touch and pain in all 4 limbs.  No limb ataxia or cerebellar signs. No abnormal tone appreciated.  .   + BL UE intention tremor     Musculoskeletal: Passive flexion to about 90 degrees.  Left knee swelling present.   Assessment/Plan: 1. Functional deficits which require 3+ hours per day of interdisciplinary therapy in a comprehensive inpatient rehab setting. Physiatrist is providing close team supervision and 24 hour management of active medical problems listed below. Physiatrist and rehab team continue to assess barriers to discharge/monitor patient progress toward functional and medical goals  Care Tool:  Bathing    Body parts bathed by patient: Right arm, Left arm, Chest, Abdomen, Front perineal area, Buttocks, Right upper leg, Left upper leg, Right lower leg, Left lower leg, Face         Bathing assist Assist Level: Independent with assistive device     Upper Body Dressing/Undressing Upper body dressing   What is the patient wearing?: Pull over shirt    Upper body assist Assist Level: Independent  with assistive device    Lower Body Dressing/Undressing Lower body dressing      What is the patient wearing?: Underwear/pull up, Pants     Lower body assist Assist for lower body dressing: Independent with assitive device     Toileting Toileting    Toileting assist Assist for toileting: Independent with assistive device     Transfers Chair/bed transfer  Transfers assist     Chair/bed transfer assist level: Independent with assistive device     Locomotion Ambulation   Ambulation assist      Assist level: Supervision/Verbal cueing Assistive device: Walker-rolling Max distance: 155   Walk 10 feet activity   Assist     Assist level: Independent with assistive device Assistive device: Walker-rolling   Walk 50 feet activity   Assist Walk 50  feet with 2 turns activity did not occur: Safety/medical concerns (pain/weakness/fatigue)  Assist level: Independent with assistive device Assistive device: Walker-rolling    Walk 150 feet activity   Assist Walk 150 feet activity did not occur: Safety/medical concerns (pain/weakness/fatigue)  Assist level: Supervision/Verbal cueing Assistive device: Walker-rolling    Walk 10 feet on uneven surface  activity   Assist Walk 10 feet on uneven surfaces activity did not occur: Safety/medical concerns (pain/weakness/fatigue)   Assist level: Supervision/Verbal cueing Assistive device: Walker-rolling   Wheelchair     Assist Is the patient using a wheelchair?: No Type of Wheelchair: Manual    Wheelchair assist level: Contact Guard/Touching assist Max wheelchair distance: 75 ft    Wheelchair 50 feet with 2 turns activity    Assist        Assist Level: Contact Guard/Touching assist   Wheelchair 150 feet activity     Assist      Assist Level: Moderate Assistance - Patient 50 - 74%   Blood pressure (!) 114/57, pulse 71, temperature (!) 97.4 F (36.3 C), temperature source Oral, resp. rate 16, height 5' 11 (1.803 m), weight 86.2 kg, SpO2 99%.  Medical Problem List and Plan: 1. 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   weightbearing as tolerated             -patient may shower             -ELOS/Goals: 8-12 days modI             -12/5 Continue CIR therapies including PT, OT   -TEDS for LE edema. Discussed use of TEDs and leg elevation-appears a little improved  -02/09/24 d/c today, paperwork reviewed.   2.  Antithrombotics: -DVT/anticoagulation:  Pharmaceutical: Eliquis .  Venous Doppler studies nuclear medicine perfusion scan negative             -antiplatelet therapy: N/A   3. Pain Management: continue Robaxin  and oxycodone  as needed  -Pain overall under control, he tries to avoid taking pain  medications  -Discussed Journavx-patient is not interested in any pain medications     4. Mood/Behavior/Sleep: Provide emotional support             -antipsychotic agents: N/A  - 12/8 melatonin for insomnia  -12/9 trazodone  PRN- pt asks for this not to be scheduled for sleep  -12/10 Pt did not use prn trazadone last night- monitor   5. Neuropsych/cognition: This patient is capable of making decisions on his own behalf.   6. Skin/Wound Care/pressure injury buttocks:   - Continue local wound care for buttocks-appears stable   -staples out. Keep incision clean and dry  7. Fluids/Electrolytes/Nutrition: Routine  in and outs with follow-up chemistries  12/6: ?why on fluid restriction - defer to primary team   8.  Leukocytosis/HCAP.  Completing 7-day course cefepime  as well as Bactrim .  MRSA PCR negative   12/4 wbcs down to 15.2k---> recheck Monday   12/11 WBC down to 10.5, within normal range  9.  Elevated troponin/PAF/PVCs/NSVT with history of CHB status post PPM.  Continue chronic Eliquis  as well as Lopressor  25 mg twice daily.  Imdur  currently on hold.  Follow-up cardiology services.   Elevated troponin felt to be related to demand  ischemia as well as related to pneumonia.   10.  CAD/CABG 2006.CHF.  Followed by cardiology services    - EF 50-55%  12/6: Monitor vitals with IVF, consider CXR in AM if any desats/sob  12-7: Fluid resuscitation limited by increasing edema.  Appears Lasix  40 milligrams was resumed this morning, will DC for tomorrow, may just be overdiuresed and need a few days before resuming/adjusting dose.  Courage patient to wear TED hose  -daily weight  12/10 Rolan Pitch spoke with cardiology HF team, resume lasix  hold losartan .   12/11 leg swelling appears similar to yesterday, continue leg elevation, Lasix  and compression  12/12 weight a little higher today but swelling appears slightly improved continue to monitor  -02/09/24 VSS, wt not done, monitor  outpatient [facility] Weights   01/30/24 1739 01/30/24 1740 02/06/24 1405  Weight: 84.9 kg 84.9 kg 86.2 kg      02/09/2024    9:36 AM 02/09/2024    5:15 AM 02/08/2024    7:31 PM  Vitals with BMI  Systolic 114 117 883  Diastolic 57 55 66  Pulse 71 62 60      11.  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  Follow-up chemistries.  Losartan  and Jardiance  currently on hold   12/4 Cr with bump to 1.4 today. Will recheck tomorrow to make sure there is no progression. 12/5 dizzy today, Cr continues to creep up, BUN still elevated.   -will hold lasix  tomorrow  -push fluids today  -repeat BMET in am.  12/6: Creatinine up to 1.71, BUN 37; PO fluids very poor; will give IV fluids through today and 75 cc/h and repeat in AM.  12-7: BUN/creatinine slightly improved to 1.6, however significant peripheral edema with IV fluid.  Will likely increase again tomorrow with Lasix , hold a.m. dose Lasix , encourage p.o. fluids. 12-8 BUN and creatinine 30/1.59, continue current regimen for now 12/11 BUN and creatinine overall stable, creatinine is better at 1.34, BUN a little higher at 34 12.  Hyperlipidemia: continue Lipitor   13.  Constipation: continue MiraLAX  daily, Senokot -S 2 tablets nightly  12/5 -had multiple BM's last night  LBM 12/5, hard  11/10 LBM today  11/11 decrease Senokot to 1 tablet nightly for loose bowel movements reported  11/12 LBM yesterday continue current regimen and monitor  -02/09/24 LBM last night, monitor outpatient  14. Post-op anemia/?ACD  -12/8 hemoglobin stable at 10.6  -12/11 hemoglobin stable 10.5  15.  Indigestion   - Continue PPI twice daily, Maalox available as needed  -12/9 reports improved, discussed maalox PRN  LOS: 10 days A FACE TO FACE EVALUATION WAS PERFORMED  [address] 02/09/2024, 10:01 AM

## 2024-02-09 NOTE — Plan of Care (Signed)
  Problem: RH Balance Goal: LTG Patient will maintain dynamic standing with ADLs (OT) Description: LTG:  Patient will maintain dynamic standing balance with assist during activities of daily living (OT)  Outcome: Completed/Met   Problem: RH Eating Goal: LTG Patient will perform eating w/assist, cues/equip (OT) Description: LTG: Patient will perform eating with assist, with/without cues using equipment (OT) Outcome: Completed/Met   Problem: RH Grooming Goal: LTG Patient will perform grooming w/assist,cues/equip (OT) Description: LTG: Patient will perform grooming with assist, with/without cues using equipment (OT) Outcome: Completed/Met   Problem: RH Bathing Goal: LTG Patient will bathe all body parts with assist levels (OT) Description: LTG: Patient will bathe all body parts with assist levels (OT) Outcome: Completed/Met   Problem: RH Dressing Goal: LTG Patient will perform upper body dressing (OT) Description: LTG Patient will perform upper body dressing with assist, with/without cues (OT). Outcome: Completed/Met Goal: LTG Patient will perform lower body dressing w/assist (OT) Description: LTG: Patient will perform lower body dressing with assist, with/without cues in positioning using equipment (OT) Outcome: Completed/Met   Problem: RH Toileting Goal: LTG Patient will perform toileting task (3/3 steps) with assistance level (OT) Description: LTG: Patient will perform toileting task (3/3 steps) with assistance level (OT)  Outcome: Completed/Met   Problem: RH Toilet Transfers Goal: LTG Patient will perform toilet transfers w/assist (OT) Description: LTG: Patient will perform toilet transfers with assist, with/without cues using equipment (OT) Outcome: Completed/Met   Problem: RH Tub/Shower Transfers Goal: LTG Patient will perform tub/shower transfers w/assist (OT) Description: LTG: Patient will perform tub/shower transfers with assist, with/without cues using equipment  (OT) Outcome: Completed/Met

## 2024-02-09 NOTE — Plan of Care (Signed)

## 2024-02-09 NOTE — Progress Notes (Signed)
 Discharged with two siblings picking patient up. Family using vehicle to transport patient to place of residence. No issues or concerns to report. No questions by family or patient. Medication picked up from [facility].

## 2024-02-18 ENCOUNTER — Ambulatory Visit: Attending: Nurse Practitioner | Admitting: Nurse Practitioner

## 2024-02-18 ENCOUNTER — Ambulatory Visit (INDEPENDENT_AMBULATORY_CARE_PROVIDER_SITE_OTHER)

## 2024-02-18 ENCOUNTER — Encounter: Payer: Self-pay | Admitting: Nurse Practitioner

## 2024-02-18 ENCOUNTER — Telehealth: Payer: Self-pay | Admitting: Cardiology

## 2024-02-18 VITALS — BP 118/78 | HR 78 | Ht 71.0 in | Wt 178.0 lb

## 2024-02-18 DIAGNOSIS — Z950 Presence of cardiac pacemaker: Secondary | ICD-10-CM | POA: Diagnosis not present

## 2024-02-18 DIAGNOSIS — R2242 Localized swelling, mass and lump, left lower limb: Secondary | ICD-10-CM | POA: Diagnosis not present

## 2024-02-18 DIAGNOSIS — I2510 Atherosclerotic heart disease of native coronary artery without angina pectoris: Secondary | ICD-10-CM | POA: Insufficient documentation

## 2024-02-18 DIAGNOSIS — M7989 Other specified soft tissue disorders: Secondary | ICD-10-CM | POA: Diagnosis present

## 2024-02-18 DIAGNOSIS — I779 Disorder of arteries and arterioles, unspecified: Secondary | ICD-10-CM | POA: Diagnosis not present

## 2024-02-18 DIAGNOSIS — L0390 Cellulitis, unspecified: Secondary | ICD-10-CM | POA: Insufficient documentation

## 2024-02-18 DIAGNOSIS — I480 Paroxysmal atrial fibrillation: Secondary | ICD-10-CM | POA: Diagnosis not present

## 2024-02-18 DIAGNOSIS — E782 Mixed hyperlipidemia: Secondary | ICD-10-CM | POA: Insufficient documentation

## 2024-02-18 DIAGNOSIS — I5033 Acute on chronic diastolic (congestive) heart failure: Secondary | ICD-10-CM | POA: Insufficient documentation

## 2024-02-18 DIAGNOSIS — I10 Essential (primary) hypertension: Secondary | ICD-10-CM | POA: Insufficient documentation

## 2024-02-18 MED ORDER — TORSEMIDE 20 MG PO TABS: 20.0000 mg | ORAL_TABLET | Freq: Every day | ORAL | 5 refills | Status: DC

## 2024-02-18 NOTE — Telephone Encounter (Signed)
 Patient seeing Almarie Crate, NP today.

## 2024-02-18 NOTE — Telephone Encounter (Signed)
 Pt c/o swelling/edema: STAT if pt has developed SOB within 24 hours  If swelling, where is the swelling located? Lower leg, feet   How much weight have you gained and in what time span?   Have you gained 2 pounds in a day or 5 pounds in a week?   Do you have a log of your daily weights (if so, list)?   Are you currently taking a fluid pill? Yes   Are you currently SOB? Pt not present   Have you traveled recently in a car or plane for an extended period of time? No   Pts daughter calling in regard to fathers swelling in his lower legs and feet. He had a knee surgery a month ago and has went back to the hospital and been admitted due to sweling and infection. Pt was discharged but swelling persists.  Daughter is a engineer, civil (consulting) and states it is past 4 pit edema she cannot even get compression socks on. She has been doing all she can to reduce swelling, but has not been successful.

## 2024-02-18 NOTE — Patient Instructions (Addendum)
 Medication Instructions:  Your physician has recommended you make the following change in your medication:  Stop taking Lasix   Start taking Torsemide  20 mg once daily Continue taking all other medications as prescribed  Labwork: BMET, ProBNP or Magnesium  to be completed on Friday 02/22/2024 at [facility]  Testing/Procedures: Your physician has requested that you have a lower or upper extremity venous duplex today. This test is an ultrasound of the veins in the legs or arms. It looks at venous blood flow that carries blood from the heart to the legs or arms. Allow one hour for a Lower Venous exam. Allow thirty minutes for an Upper Venous exam. There are no restrictions or special instructions.  Please note: We ask at that you not bring children with you during ultrasound (echo/ vascular) testing. Due to room size and safety concerns, children are not allowed in the ultrasound rooms during exams. Our front office staff cannot provide observation of children in our lobby area while testing is being conducted. An adult accompanying a patient to their appointment will only be allowed in the ultrasound room at the discretion of the ultrasound technician under special circumstances. We apologize for any inconvenience.   Follow-Up: Your physician recommends that you schedule a follow-up appointment in: 3-4 weeks  Any Other Special Instructions Will Be Listed Below (If Applicable). Please call the office on Friday to update us  on his symptoms   HEART FAILURE INSTRUCTION SHEET  Follow a low-salt diet-you are allowed no more than 2,000 mg of sodium per day. Watch your fluid intake. In general, you should not be taking more than 64 ounces a day (no more than 8 glasses per day). Sometimes we refer to this as 2 liters per day. This includes sources of water  in food like soup, coffee, tea, milk etc. Weigh yourself on the same scale at the same time of the day preferably immediately after your first  void. Keep a log of your weights. Call your doctor: (Anytime you feel any of the following symptoms)  3 lbs weight gain overnight or 5 lbs within a week Shortness of breath, with or without a day hacking cough Swelling in hands, feet or stomach If you have to sleep on extra pillows at night in order to breathe   IT IS IMPORTANT TO LET YOUR DOCTOR KNOW EARLY ON IF YOU ARE HAVING SYMPTOMS SO WE CAN HELP YOU!    Thank you for choosing [facility]!     If you need a refill on your cardiac medications before your next appointment, please call your pharmacy.

## 2024-02-18 NOTE — Progress Notes (Signed)
 " Cardiology Office Note   Date:  02/18/2024  ID:  William Hatfield, DOB [birth year], MRN [id number] PCP: Henriette Anes, DO  [facility] Providers Cardiologist:  [name] Sierras, MD Electrophysiologist:  Donnice DELENA Primus, MD     History of Present Illness William Hatfield is a 78 y.o. male with a PMH of CAD, s/p CABG in 2006 at [facility], [place], HFpEF, complete heart block, s/p PPM in 2021, mixed HLD, HTN, carotid artery disease, s/p TCAR of RICA in 09/2022, prior hx of L CEA, who presents today for hospital follow-up.   Last seen by Dr. Sierras on 03/06/2023.  At that time, reported once or twice a week episodes of short-lived angina.  And reported sometimes feeling shortness of breath when this happened.  Imdur  was started at 50 mg daily for his symptoms.  Otherwise, was doing well and was told to follow-up in 6 months.  He underwent total left knee arthroplasty on January 16, 2024.  Recent hospital admission due to HCAP.  Treated with IV antibiotics and also treated for infected sacral decubitus gluteal stage III ulcer.  Doppler for right lower extremity was negative for DVT.  Orthopedic surgery felt that erythema is likely not from infection or postop changes.  VQ scan was negative.  2D echo showed normal EF, Imdur  held due to orthostasis.  Elevated troponin was felt to be likely due to demand ischemia in the setting of pneumonia, cardiology was following him.  He went to rehab for therapy after d/c.   Today he is here for hospital follow-up with his daughter who is an [employer]. His daughter states he has not been doing well recently, shows me that he has bialteral leg edema, left more than right, and worsening skin redness, along with weeping and blisters that have been along his legs.  Patient has been compliant with his medications and elevating his legs at night.  No improvement in his leg swelling.  Per chart review he has already received doxycycline  that caused nausea along with Augmentin  and received  7 days of oral Bactrim .  Daughter wonders if the fluid pill needs to be switched. Denies any chest pain, shortness of breath, palpitations, syncope, presyncope, dizziness, orthopnea, PND, acute bleeding, or claudication.  Denies any nausea, chills, fever, vomiting, diarrhea.  ROS: Negative.  See HPI.  Studies Reviewed  EKG: EKG is not ordered today.   Echo 12/2023: 1. Left ventricular ejection fraction, by estimation, is 50 to 55%. The  left ventricle has low normal function. The left ventricle has no regional  wall motion abnormalities. There is severe concentric left ventricular  hypertrophy. Left ventricular  diastolic parameters are indeterminate.   2. Right ventricular systolic function is normal. The right ventricular  size is normal.   3. The mitral valve is normal in structure. Mild to moderate mitral valve  regurgitation. No evidence of mitral stenosis.   4. The aortic valve has an indeterminant number of cusps. Aortic valve  regurgitation is not visualized. No aortic stenosis is present.   FINDINGS   Left Ventricle: Left ventricular ejection fraction, by estimation, is 50  to 55%. The left ventricle has low normal function. The left ventricle has  no regional wall motion abnormalities. The left ventricular internal  cavity size was normal in size.  There is severe concentric left ventricular hypertrophy. Left ventricular  diastolic parameters are indeterminate.  Vascular ultrasound lower extremity Doppler 12/2023: LEFT:   - There is no evidence of deep vein thrombosis in the lower  extremity.    - No cystic structure found in the popliteal fossa.  Subcutaneous edema seen in area of calf and ankle.     Carotid duplex 07/2023: Summary:  Right Carotid: The ECA appears >50% stenosed. Patent stent with no  stenosis.   Left Carotid: Velocities in the left ICA are consistent with a 1-39%  stenosis.   Vertebrals: Bilateral vertebral arteries demonstrate antegrade flow.   Subclavians: Normal flow hemodynamics were seen in bilateral subclavian               arteries.  Left heart cath 08/2016: 1. Severe native three-vessel coronary artery disease with diffuse nonobstructive stenosis of the RCA, severe stenosis of the PDA origin and PLA origin, and continued patency of the saphenous vein graft to PLA branch 2. Total occlusion of the LAD with continued patency of the LIMA to LAD and saphenous vein graft to diagonal 3. Total occlusion of the left circumflex with total occlusion of the saphenous vein graft OM 4. Unsuccessful PCI of the saphenous vein graft to OM despite its extensive balloon angioplasty with inability to restore flow 5. Elevated LVEDP    Recommendations: Medical therapy for CAD/non-STEMI. Will treat him with Plavix        Physical Exam VS:  BP 118/78 (BP Location: Left Arm)   Pulse 78   Ht 5' 11 (1.803 m)   Wt 178 lb (80.7 kg)   SpO2 100%   BMI 24.83 kg/m        Wt Readings from Last 3 Encounters:  02/18/24 178 lb (80.7 kg)  02/06/24 190 lb (86.2 kg)  01/30/24 189 lb 2.5 oz (85.8 kg)    GEN: Well nourished, well developed in no acute distress NECK: No JVD; No carotid bruits CARDIAC: S1/S2, RRR, no murmurs, rubs, gallops RESPIRATORY:  Clear to auscultation without rales, wheezing or rhonchi  ABDOMEN: Soft, non-tender, non-distended EXTREMITIES: 2-3+ pitting edema to BLE (L>R), with redness/red patches to BLE; No deformity   ASSESSMENT AND PLAN  Acute on chronic diastolic CHF, leg edema, cellulitis Stage C, NYHA class I due to symptoms.  EF 50 to 55%.  Does show volume overload on exam with 2-3+ pitting edema-see physical exam above.  BNP from earlier this month was over 16,000.  No current improvement with Lasix .  Will obtain stat venous Doppler of bilateral lower extremities to rule out DVT.  Will stop Lasix  and begin torsemide  20 mg daily starting tomorrow.  Will obtain BMET, proBNP, and Mag at [facility] by this Friday, 12/26.  Patient's  daughter will call by this Friday with an update regarding his symptoms and weight trends. Low sodium diet, fluid restriction <2L, and daily weights encouraged. Educated to contact our office for weight gain of 2 lbs overnight or 5 lbs in one week.  I consulted 2 different clinical pharmacist regarding what appears to be acute cellulitis and has had multiple antibiotic treatments recently-seems to have some antibiotic resistance.  Will place urgent referral to infectious disease as well as wound care clinic for further evaluation and management that was recommended. Care and ED precautions discussed.   CAD, s/p CABG Stable with no anginal symptoms. No indication for ischemic evaluation.  Not on aspirin  due to being on Eliquis .  Continue atorvastatin , Lopressor , and nitroglycerin  as needed. Heart healthy diet and regular cardiovascular exercise encouraged.   PAF Denies any tachycardia or palpitations.  Heart rate is well-controlled today.  Continue Lopressor  and continue Eliquis  for stroke prevention.  He is on appropriate dosage.  Denies any bleeding issues.   CHB, s/p PPM Most recent remote device check showed normal device function.  Continue follow-up with EP.  Mixed HLD, carotid artery disease Most recent LDL was 31 from November 2025.  He is at goal.  Continue atorvastatin . Heart healthy diet and regular cardiovascular exercise encouraged.  He is followed by vascular surgery.  See most recent carotid duplex report noted above.  Continue follow-up with this team.  HTN  Blood pressure stable and at goal. Discussed to monitor BP at home at least 2 hours after medications and sitting for 5-10 minutes.  Continue current medication regimen.  Low-salt, heart healthy diet recommended.  I spent a total duration of 50 minutes reviewing prior notes, reviewing outside records including  labs, face-to-face counseling of medical condition, pathophysiology, evaluation, management, and documenting the findings  in the note.    Dispo: Follow-up with me/APP in 3 to 4 weeks or sooner if any changes.  Signed, Almarie Crate, NP   "

## 2024-02-19 ENCOUNTER — Ambulatory Visit: Payer: Self-pay | Admitting: Nurse Practitioner

## 2024-02-22 ENCOUNTER — Inpatient Hospital Stay (HOSPITAL_COMMUNITY)
Admission: EM | Admit: 2024-02-22 | Discharge: 2024-02-26 | DRG: 291 | Disposition: A | Discharge status: Home / Self Care | Attending: Internal Medicine | Admitting: Internal Medicine

## 2024-02-22 ENCOUNTER — Emergency Department (HOSPITAL_COMMUNITY)

## 2024-02-22 ENCOUNTER — Telehealth: Payer: Self-pay | Admitting: Nurse Practitioner

## 2024-02-22 ENCOUNTER — Other Ambulatory Visit: Payer: Self-pay

## 2024-02-22 ENCOUNTER — Encounter (HOSPITAL_COMMUNITY): Payer: Self-pay

## 2024-02-22 DIAGNOSIS — Z79899 Other long term (current) drug therapy: Secondary | ICD-10-CM | POA: Diagnosis not present

## 2024-02-22 DIAGNOSIS — I2510 Atherosclerotic heart disease of native coronary artery without angina pectoris: Secondary | ICD-10-CM | POA: Diagnosis present

## 2024-02-22 DIAGNOSIS — Z951 Presence of aortocoronary bypass graft: Secondary | ICD-10-CM | POA: Diagnosis not present

## 2024-02-22 DIAGNOSIS — Z961 Presence of intraocular lens: Secondary | ICD-10-CM | POA: Diagnosis present

## 2024-02-22 DIAGNOSIS — I5033 Acute on chronic diastolic (congestive) heart failure: Secondary | ICD-10-CM | POA: Diagnosis present

## 2024-02-22 DIAGNOSIS — Z8616 Personal history of COVID-19: Secondary | ICD-10-CM | POA: Diagnosis not present

## 2024-02-22 DIAGNOSIS — I480 Paroxysmal atrial fibrillation: Secondary | ICD-10-CM | POA: Diagnosis present

## 2024-02-22 DIAGNOSIS — Z888 Allergy status to other drugs, medicaments and biological substances status: Secondary | ICD-10-CM | POA: Diagnosis not present

## 2024-02-22 DIAGNOSIS — I10 Essential (primary) hypertension: Secondary | ICD-10-CM | POA: Diagnosis present

## 2024-02-22 DIAGNOSIS — Z950 Presence of cardiac pacemaker: Secondary | ICD-10-CM | POA: Diagnosis not present

## 2024-02-22 DIAGNOSIS — Z96652 Presence of left artificial knee joint: Secondary | ICD-10-CM | POA: Diagnosis present

## 2024-02-22 DIAGNOSIS — Z7901 Long term (current) use of anticoagulants: Secondary | ICD-10-CM | POA: Diagnosis not present

## 2024-02-22 DIAGNOSIS — Z87891 Personal history of nicotine dependence: Secondary | ICD-10-CM

## 2024-02-22 DIAGNOSIS — I739 Peripheral vascular disease, unspecified: Secondary | ICD-10-CM | POA: Diagnosis present

## 2024-02-22 DIAGNOSIS — I878 Other specified disorders of veins: Secondary | ICD-10-CM | POA: Diagnosis present

## 2024-02-22 DIAGNOSIS — I130 Hypertensive heart and chronic kidney disease with heart failure and stage 1 through stage 4 chronic kidney disease, or unspecified chronic kidney disease: Secondary | ICD-10-CM | POA: Diagnosis present

## 2024-02-22 DIAGNOSIS — L89153 Pressure ulcer of sacral region, stage 3: Secondary | ICD-10-CM | POA: Diagnosis present

## 2024-02-22 DIAGNOSIS — D631 Anemia in chronic kidney disease: Secondary | ICD-10-CM | POA: Diagnosis present

## 2024-02-22 DIAGNOSIS — I252 Old myocardial infarction: Secondary | ICD-10-CM | POA: Diagnosis not present

## 2024-02-22 DIAGNOSIS — Z8249 Family history of ischemic heart disease and other diseases of the circulatory system: Secondary | ICD-10-CM

## 2024-02-22 DIAGNOSIS — N1831 Chronic kidney disease, stage 3a: Secondary | ICD-10-CM | POA: Diagnosis present

## 2024-02-22 DIAGNOSIS — I509 Heart failure, unspecified: Principal | ICD-10-CM

## 2024-02-22 DIAGNOSIS — E869 Volume depletion, unspecified: Secondary | ICD-10-CM | POA: Diagnosis present

## 2024-02-22 DIAGNOSIS — I5023 Acute on chronic systolic (congestive) heart failure: Secondary | ICD-10-CM | POA: Diagnosis not present

## 2024-02-22 DIAGNOSIS — N189 Chronic kidney disease, unspecified: Secondary | ICD-10-CM | POA: Diagnosis present

## 2024-02-22 LAB — HEPATIC FUNCTION PANEL
ALT: 25 U/L (ref 0–44)
AST: 23 U/L (ref 15–41)
Albumin: 3.8 g/dL (ref 3.5–5.0)
Alkaline Phosphatase: 114 U/L (ref 38–126)
Bilirubin, Direct: 0.3 mg/dL — ABNORMAL HIGH (ref 0.0–0.2)
Indirect Bilirubin: 0.2 mg/dL — ABNORMAL LOW (ref 0.3–0.9)
Total Bilirubin: 0.4 mg/dL (ref 0.0–1.2)
Total Protein: 6.6 g/dL (ref 6.5–8.1)

## 2024-02-22 LAB — TROPONIN T, HIGH SENSITIVITY
Troponin T High Sensitivity: 83 ng/L — ABNORMAL HIGH (ref 0–19)
Troponin T High Sensitivity: 82 ng/L — ABNORMAL HIGH (ref 0–19)

## 2024-02-22 LAB — CBC
HCT: 32.4 % — ABNORMAL LOW (ref 39.0–52.0)
Hemoglobin: 10 g/dL — ABNORMAL LOW (ref 13.0–17.0)
MCH: 28 pg (ref 26.0–34.0)
MCHC: 30.9 g/dL (ref 30.0–36.0)
MCV: 90.8 fL (ref 80.0–100.0)
Platelets: 318 K/uL (ref 150–400)
RBC: 3.57 MIL/uL — ABNORMAL LOW (ref 4.22–5.81)
RDW: 16 % — ABNORMAL HIGH (ref 11.5–15.5)
WBC: 8.5 K/uL (ref 4.0–10.5)
nRBC: 0 % (ref 0.0–0.2)

## 2024-02-22 LAB — BASIC METABOLIC PANEL WITH GFR
Anion gap: 8 (ref 5–15)
BUN: 37 mg/dL — ABNORMAL HIGH (ref 8–23)
CO2: 34 mmol/L — ABNORMAL HIGH (ref 22–32)
Calcium: 9.5 mg/dL (ref 8.9–10.3)
Chloride: 98 mmol/L (ref 98–111)
Creatinine, Ser: 1.84 mg/dL — ABNORMAL HIGH (ref 0.61–1.24)
GFR, Estimated: 37 mL/min — ABNORMAL LOW
Glucose, Bld: 119 mg/dL — ABNORMAL HIGH (ref 70–99)
Potassium: 4 mmol/L (ref 3.5–5.1)
Sodium: 139 mmol/L (ref 135–145)

## 2024-02-22 LAB — URINALYSIS, ROUTINE W REFLEX MICROSCOPIC
Bilirubin Urine: NEGATIVE
Glucose, UA: NEGATIVE mg/dL
Hgb urine dipstick: NEGATIVE
Ketones, ur: NEGATIVE mg/dL
Leukocytes,Ua: NEGATIVE
Nitrite: NEGATIVE
Protein, ur: NEGATIVE mg/dL
Specific Gravity, Urine: 1.01 (ref 1.005–1.030)
pH: 5 (ref 5.0–8.0)

## 2024-02-22 MED ORDER — ATORVASTATIN CALCIUM 40 MG PO TABS
40.0000 mg | ORAL_TABLET | Freq: Every evening | ORAL | Status: DC
  Administered 2024-02-22 – 2024-02-25 (×4): 40 mg via ORAL
  Filled 2024-02-22 (×3): qty 1

## 2024-02-22 MED ORDER — SENNOSIDES-DOCUSATE SODIUM 8.6-50 MG PO TABS
1.0000 | ORAL_TABLET | Freq: Every day | ORAL | Status: DC
  Administered 2024-02-22 – 2024-02-25 (×4): 1 via ORAL
  Filled 2024-02-22 (×3): qty 1

## 2024-02-22 MED ORDER — METOPROLOL TARTRATE 25 MG PO TABS
25.0000 mg | ORAL_TABLET | Freq: Two times a day (BID) | ORAL | Status: DC
  Administered 2024-02-22 – 2024-02-26 (×8): 25 mg via ORAL
  Filled 2024-02-22 (×5): qty 1

## 2024-02-22 MED ORDER — FUROSEMIDE 10 MG/ML IJ SOLN
60.0000 mg | Freq: Two times a day (BID) | INTRAMUSCULAR | Status: DC
  Administered 2024-02-22 – 2024-02-23 (×3): 60 mg via INTRAVENOUS
  Filled 2024-02-22 (×3): qty 6

## 2024-02-22 MED ORDER — PANTOPRAZOLE SODIUM 40 MG PO TBEC
40.0000 mg | DELAYED_RELEASE_TABLET | Freq: Two times a day (BID) | ORAL | Status: DC
  Administered 2024-02-22 – 2024-02-26 (×8): 40 mg via ORAL
  Filled 2024-02-22 (×5): qty 1

## 2024-02-22 MED ORDER — ALBUMIN HUMAN 25 % IV SOLN
25.0000 g | Freq: Four times a day (QID) | INTRAVENOUS | Status: DC
  Administered 2024-02-22 – 2024-02-23 (×3): 25 g via INTRAVENOUS
  Filled 2024-02-22 (×4): qty 100

## 2024-02-22 MED ORDER — POTASSIUM CHLORIDE CRYS ER 20 MEQ PO TBCR
40.0000 meq | EXTENDED_RELEASE_TABLET | Freq: Two times a day (BID) | ORAL | Status: DC
  Administered 2024-02-22 – 2024-02-26 (×8): 40 meq via ORAL
  Filled 2024-02-22 (×5): qty 2

## 2024-02-22 NOTE — Telephone Encounter (Addendum)
 Called and spoke with patient's daughter (okay per DPR) after getting MyChart message today with status update and his most recent labs that were obtained this morning with [facility] that shows serum creatinine at 2.23, eGFR 30, magnesium  2.1, and proBNP 9,557.  Patient's daughter confirms that he has not taken the extra 20 mg tablet of torsemide  today per last recommendations given via MyChart.  I recommended not to take the extra 20 mg of torsemide  at this time based on these labs.   It appears patient is still showing signs of acute on chronic CHF exacerbation along with cellulitis that has not improved.  Due to his current AKI on CKD and his other signs and symptoms, I recommended he proceed to [facility] emergency department for further evaluation.  He is now requiring IV diuresis as controlling this outpatient is now challenging.  I answered all patient's daughter's questions and she verbalized understanding was appreciative of my call.  She says she will be taking him to the ED today.  I will follow-up on his case Monday, December 29 to see how he is doing.  Almarie Crate, NP

## 2024-02-22 NOTE — ED Triage Notes (Signed)
 Pt arrives from home with daughter who report he was sent here by cardiologist for his abnormal labs states that cardiology told him to go to ED for heart failure and kidney failure Pt has edema to RLE and LLE.

## 2024-02-22 NOTE — Plan of Care (Signed)

## 2024-02-22 NOTE — ED Provider Notes (Signed)
 " [place] EMERGENCY DEPARTMENT AT [facility] Provider Note   CSN: [id number] Arrival date & time: 02/22/24  1254     Patient presents with: abnormal labs   William Hatfield is a 78 y.o. male.   HPI Will get cardiology for admission to the hospital for IV diuresis.  Increasing fluid since surgery.  Has had increasing of his Lasix  and then changed to torsemide .  Creatinine now up to 2.2 when it was 1.32 weeks ago.  Has had more shortness of breath.  Some difficulty walking.  No fevers.  Has been on different antibiotics for leg redness cellulitis.  No chest pain.    Prior to Admission medications  Medication Sig Start Date End Date Taking? Authorizing Provider  acetaminophen  (TYLENOL ) 500 MG tablet Take 500-1,000 mg by mouth every 6 (six) hours as needed for fever or mild pain (pain score 1-3) (headaches, or discomfort).    [provider]  allopurinol  (ZYLOPRIM ) 100 MG tablet Take 1 tablet (100 mg total) by mouth in the morning. 02/08/24   Angiulli, Toribio PARAS, PA-C  apixaban  (ELIQUIS ) 5 MG TABS tablet Take 1 tablet (5 mg total) by mouth 2 (two) times daily. 02/08/24   Angiulli, Toribio PARAS, PA-C  atorvastatin  (LIPITOR) 40 MG tablet Take 1 tablet (40 mg total) by mouth every evening. 02/08/24   Angiulli, Toribio PARAS, PA-C  docusate sodium  (COLACE) 100 MG capsule Take 1 capsule (100 mg total) by mouth 2 (two) times daily. Patient taking differently: Take 100 mg by mouth in the morning and at bedtime. 01/18/24   Bissell, Jaclyn M, PA-C  melatonin 3 MG TABS tablet Take 1 tablet (3 mg total) by mouth at bedtime. Patient not taking: Reported on 02/18/2024 02/08/24   Angiulli, Toribio PARAS, PA-C  methocarbamol  (ROBAXIN ) 500 MG tablet Take 1 tablet (500 mg total) by mouth every 6 (six) hours as needed for muscle spasms. Patient not taking: Reported on 02/18/2024 02/08/24   Pegge Toribio PARAS, PA-C  metoprolol  tartrate (LOPRESSOR ) 25 MG tablet Take 1 tablet (25 mg total) by mouth 2 (two)  times daily. 02/08/24   Angiulli, Toribio PARAS, PA-C  nitroGLYCERIN  (NITROSTAT ) 0.4 MG SL tablet DISSOLVE 1 TABLET UNDER THE TONGUE EVERY 5 MINUTES FOR 3 DOSES AS NEEDED FOR CHEST PAIN AS DIRECTED. 11/14/23   Debera [name] MATSU, MD  Nystatin (GERHARDT'S BUTT CREAM) CREA Apply 1 Application topically 2 (two) times daily. Patient not taking: Reported on 02/18/2024 02/08/24   Angiulli, Toribio PARAS, PA-C  oxyCODONE  (OXY IR/ROXICODONE ) 5 MG immediate release tablet Take 1 tablet (5 mg total) by mouth every 4 (four) hours as needed for moderate pain (pain score 4-6). Patient not taking: Reported on 02/18/2024 02/08/24   Pegge Toribio PARAS, PA-C  pantoprazole  (PROTONIX ) 40 MG tablet Take 1 tablet (40 mg total) by mouth 2 (two) times daily. 02/08/24 03/10/24  Angiulli, Toribio PARAS, PA-C  polyethylene glycol (MIRALAX  / GLYCOLAX ) 17 g packet Take 17 g by mouth daily. Patient taking differently: Take 17 g by mouth daily as needed. 01/19/24   Bissell, Jaclyn M, PA-C  senna-docusate (SENOKOT-S) 8.6-50 MG tablet Take 1 tablet by mouth at bedtime. 02/08/24   Angiulli, Toribio PARAS, PA-C  torsemide  (DEMADEX ) 20 MG tablet Take 1 tablet (20 mg total) by mouth daily. 02/18/24   Miriam Norris, NP    Allergies: Niaspan [niacin]    Review of Systems  Updated Vital Signs BP (!) 112/59 (BP Location: Right Arm)   Pulse 60   Temp 98.3 F (  36.8 C) (Oral)   Resp 16   Ht 5' 11 (1.803 m)   Wt (S) 81.6 kg Comment: pt reports weighing daily with 2 lb weight gain overnight  SpO2 100%   BMI 25.10 kg/m   Physical Exam Vitals and nursing note reviewed.  Cardiovascular:     Rate and Rhythm: Regular rhythm.  Pulmonary:     Breath sounds: No rales.  Abdominal:     Tenderness: There is no abdominal tenderness.  Musculoskeletal:     Right lower leg: Edema present.     Left lower leg: Edema present.  Neurological:     Mental Status: He is alert.   Pitting edema bilateral lower extremities.  Does have redness of the skin on  bilateral lower legs and feet.  However not indurated or warm.  (all labs ordered are listed, but only abnormal results are displayed) Labs Reviewed  BASIC METABOLIC PANEL WITH GFR  CBC  HEPATIC FUNCTION PANEL  TROPONIN T, HIGH SENSITIVITY  TROPONIN T, HIGH SENSITIVITY    EKG: EKG Interpretation Date/Time:  Friday February 22 2024 13:35:26 EST Ventricular Rate:  61 PR Interval:    QRS Duration:  148 QT Interval:  534 QTC Calculation: 537 R Axis:   216  Text Interpretation: Ventricular-paced rhythm Confirmed by Bernard Drivers ([id number]) on 02/22/2024 1:47:59 PM  Radiology: [facility] Chest 2 View Result Date: 02/22/2024 CLINICAL DATA:  Chest pain EXAM: CHEST - 2 VIEW COMPARISON:  January 29, 2024 FINDINGS: Stable cardiomediastinal silhouette. Status post coronary bypass graft. Left-sided pacemaker is unchanged. Both lungs are clear. The visualized skeletal structures are unremarkable. IMPRESSION: No active cardiopulmonary disease. Electronically Signed   By: Lynwood Landy Raddle M.D.   On: 02/22/2024 14:20     Procedures   Medications Ordered in the ED - No data to display  Clinical Course as of 02/22/24 1628  Fri Feb 22, 2024  1628 Discussed with Dr. Symptoms.  He request the results of troponin before we can admit the patient. [NP]    Clinical Course User Index [NP] Patsey Lot, MD                                 Medical Decision Making Amount and/or Complexity of Data Reviewed Labs: ordered. Radiology: ordered.   Patient with increasing edema.  Swelling on legs.  Recent negative Doppler.  Creatinine increased.  Reviewed cardiology note and plan for admission for IV diuresis.  For now do not think it is necessarily infectious on the legs.  Will hold off on antibiotics for now.  Blood pressure mildly decreased.  Lungs are clear.  Reviewed cardiology note.  Will discuss with PCP/     Final diagnoses:  Acute on chronic heart failure, unspecified heart failure type [facility])     ED Discharge Orders     None          Patsey Lot, MD 02/22/24 1554  "
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
## 2024-02-22 NOTE — Plan of Care (Signed)
" °  Problem: Education: Goal: Knowledge of General Education information will improve Description: Including pain rating scale, medication(s)/side effects and non-pharmacologic comfort measures 02/22/2024 2235 by [name] Rodgers BIRCH, RN Outcome: Progressing 02/22/2024 2234 by [name] Rodgers BIRCH, RN Outcome: Progressing   Problem: Health Behavior/Discharge Planning: Goal: Ability to manage health-related needs will improve 02/22/2024 2235 by [name] Rodgers BIRCH, RN Outcome: Progressing 02/22/2024 2234 by [name] Rodgers BIRCH, RN Outcome: Progressing   Problem: Clinical Measurements: Goal: Ability to maintain clinical measurements within normal limits will improve 02/22/2024 2235 by [name] Rodgers BIRCH, RN Outcome: Progressing 02/22/2024 2234 by [name] Rodgers BIRCH, RN Outcome: Progressing Goal: Will remain free from infection 02/22/2024 2235 by [name] Rodgers BIRCH, RN Outcome: Progressing 02/22/2024 2234 by [name] Rodgers BIRCH, RN Outcome: Progressing Goal: Diagnostic test results will improve 02/22/2024 2235 by [name] Rodgers BIRCH, RN Outcome: Progressing 02/22/2024 2234 by [name] Rodgers BIRCH, RN Outcome: Progressing Goal: Respiratory complications will improve 02/22/2024 2235 by [name] Rodgers BIRCH, RN Outcome: Progressing 02/22/2024 2234 by [name] Rodgers BIRCH, RN Outcome: Progressing Goal: Cardiovascular complication will be avoided 02/22/2024 2235 by [name] Rodgers BIRCH, RN Outcome: Progressing 02/22/2024 2234 by [name] Rodgers BIRCH, RN Outcome: Progressing   Problem: Activity: Goal: Risk for activity intolerance will decrease 02/22/2024 2235 by [name] Rodgers BIRCH, RN Outcome: Progressing 02/22/2024 2234 by [name] Rodgers BIRCH, RN Outcome: Progressing   Problem: Nutrition: Goal: Adequate nutrition will be maintained 02/22/2024 2235 by [name] Rodgers BIRCH, RN Outcome: Progressing 02/22/2024 2234 by [name] Rodgers BIRCH, RN Outcome: Progressing   Problem: Coping: Goal: Level of anxiety will decrease 02/22/2024  2235 by [name] Rodgers BIRCH, RN Outcome: Progressing 02/22/2024 2234 by [name] Rodgers BIRCH, RN Outcome: Progressing   Problem: Elimination: Goal: Will not experience complications related to bowel motility 02/22/2024 2235 by [name] Rodgers BIRCH, RN Outcome: Progressing 02/22/2024 2234 by [name] Rodgers BIRCH, RN Outcome: Progressing Goal: Will not experience complications related to urinary retention 02/22/2024 2235 by [name] Rodgers BIRCH, RN Outcome: Progressing 02/22/2024 2234 by [name] Rodgers BIRCH, RN Outcome: Progressing   Problem: Pain Managment: Goal: General experience of comfort will improve and/or be controlled 02/22/2024 2235 by [name] Rodgers BIRCH, RN Outcome: Progressing 02/22/2024 2234 by [name] Rodgers BIRCH, RN Outcome: Progressing   Problem: Safety: Goal: Ability to remain free from injury will improve 02/22/2024 2235 by [name] Rodgers BIRCH, RN Outcome: Progressing 02/22/2024 2234 by [name] Rodgers BIRCH, RN Outcome: Progressing   Problem: Skin Integrity: Goal: Risk for impaired skin integrity will decrease 02/22/2024 2235 by [name] Rodgers BIRCH, RN Outcome: Progressing 02/22/2024 2234 by [name] Rodgers BIRCH, RN Outcome: Progressing   Problem: Education: Goal: Ability to demonstrate management of disease process will improve 02/22/2024 2235 by [name] Rodgers BIRCH, RN Outcome: Progressing 02/22/2024 2234 by [name] Rodgers BIRCH, RN Outcome: Progressing Goal: Ability to verbalize understanding of medication therapies will improve 02/22/2024 2235 by [name] Rodgers BIRCH, RN Outcome: Progressing 02/22/2024 2234 by [name] Rodgers BIRCH, RN Outcome: Progressing Goal: Individualized Educational Video(s) 02/22/2024 2235 by [name] Rodgers BIRCH, RN Outcome: Progressing 02/22/2024 2234 by [name] Rodgers BIRCH, RN Outcome: Progressing   Problem: Activity: Goal: Capacity to carry out activities will improve 02/22/2024 2235 by [name] Rodgers BIRCH, RN Outcome: Progressing 02/22/2024 2234 by [name] Rodgers BIRCH,  RN Outcome: Progressing   Problem: Cardiac: Goal: Ability to achieve and maintain adequate cardiopulmonary perfusion will improve 02/22/2024 2235 by [name] Rodgers BIRCH, RN Outcome: Progressing 02/22/2024 2234 by [name] Rodgers BIRCH, RN Outcome: Progressing   "

## 2024-02-23 DIAGNOSIS — I5023 Acute on chronic systolic (congestive) heart failure: Secondary | ICD-10-CM

## 2024-02-23 LAB — BASIC METABOLIC PANEL WITH GFR
Anion gap: 8 (ref 5–15)
BUN: 35 mg/dL — ABNORMAL HIGH (ref 8–23)
CO2: 32 mmol/L (ref 22–32)
Calcium: 9.4 mg/dL (ref 8.9–10.3)
Chloride: 99 mmol/L (ref 98–111)
Creatinine, Ser: 1.65 mg/dL — ABNORMAL HIGH (ref 0.61–1.24)
GFR, Estimated: 43 mL/min — ABNORMAL LOW
Glucose, Bld: 108 mg/dL — ABNORMAL HIGH (ref 70–99)
Potassium: 4.3 mmol/L (ref 3.5–5.1)
Sodium: 139 mmol/L (ref 135–145)

## 2024-02-23 MED ORDER — ACETAMINOPHEN 325 MG PO TABS
650.0000 mg | ORAL_TABLET | Freq: Four times a day (QID) | ORAL | Status: DC | PRN
  Administered 2024-02-23 (×2): 650 mg via ORAL
  Filled 2024-02-23 (×2): qty 2

## 2024-02-23 MED ORDER — APIXABAN 5 MG PO TABS
5.0000 mg | ORAL_TABLET | Freq: Two times a day (BID) | ORAL | Status: DC
  Administered 2024-02-23 – 2024-02-26 (×7): 5 mg via ORAL
  Filled 2024-02-23 (×4): qty 1

## 2024-02-23 NOTE — Plan of Care (Signed)
   Problem: Clinical Measurements: Goal: Diagnostic test results will improve Outcome: Progressing   Problem: Activity: Goal: Risk for activity intolerance will decrease Outcome: Progressing

## 2024-02-23 NOTE — Progress Notes (Signed)
 " PROGRESS NOTE    William Hatfield  FMW:[id number] DOB: 1946-01-31 DOA: 02/22/2024 PCP: Henriette Anes, DO   Brief Narrative:    William Hatfield is a 78 y.o. male with medical history significant of atrial fibrillation on anticoagulation, coronary artery disease with history of NSTEMI, CKD, third-degree AV block, hypertension, peripheral vascular disease, recent left knee total arthroplasty.  Patient has been having difficulty with lower extremity swelling since his surgery.  He was admitted for and placed on antibiotics due to erythema and swelling in his lower extremities bilaterally.  In addition to the antibiotics, he has had ultrasound of his lower extremities which was negative for DVT.  His last ultrasound was 4 days ago.  He was sent to the hospital by his cardiology office due to elevated labs.  Patient was admitted for acute on chronic diastolic CHF with peripheral edema and was started on Lasix  as well as some albumin .  Assessment & Plan:   Principal Problem:   Acute exacerbation of CHF (congestive heart failure) (HCC) Active Problems:   Hypertension   CAD (coronary artery disease)   Hx of CABG   PAF (paroxysmal atrial fibrillation) (HCC)   CKD (chronic kidney disease)  Assessment and Plan:   Acute exacerbation of CHF with peripheral edema Initially, the patient had soft blood pressures Patient is definitely fluid overloaded, with intravascular fluid depletion I do not feel that this represents DVT (recent normal DVT study - 12/22).  I also do not feel that this represents cellulitis as the erythemic spots are not hot to the touch and do not blanch with pressure.  I think this largely represents peripheral edema Patient's hepatic panel is pending.  His recent CMP showed an albumin  of 2.3.  Patient is likely unable to draw fluid from the periphery into the intravascular space. Will give patient albumin , followed by IV Lasix  Potassium At this point, the patient does not need updated  echocardiogram as his last echocardiogram was approximately 1 month ago Weight has decreased to 177 pounds and patient is unsure of baseline weight and creatinine is stable CKD Creatinine slightly more elevated than previous.  Creatinine levels have improved with use of albumin , continue ongoing diuresis Repeat BMP daily Coronary artery disease with PAF Continue Eliquis  Hypertension Continue blood pressure control Recent arthroplasty-obtain PT/OT evaluation    DVT prophylaxis:Eliquis  Code Status: Full Family Communication: None at bedside Disposition Plan:  Status is: Inpatient Remains inpatient appropriate because: Need for IV medications.   Consultants:  None  Procedures:  None  Antimicrobials:  None   Subjective: Patient seen and evaluated today with improvement related to diuresis.  He states that his leg swelling is improving and is overall feeling better.  He is continuing to complain of some knee pain and states he has some difficulty ambulating.  Objective: Vitals:   02/22/24 1744 02/22/24 2030 02/23/24 0120 02/23/24 0544  BP: (!) 133/96 (!) 107/56 (!) 115/58 (!) 116/59  Pulse: 62 60 60 63  Resp: 18 18 18 18   Temp: 98.1 F (36.7 C) 97.7 F (36.5 C) 99 F (37.2 C) 98.7 F (37.1 C)  TempSrc: Oral Oral Oral Oral  SpO2: 100% 100% 100% 98%  Weight:    80.4 kg  Height:        Intake/Output Summary (Last 24 hours) at 02/23/2024 0746 Last data filed at 02/23/2024 0500 Gross per 24 hour  Intake 496 ml  Output 400 ml  Net 96 ml   Filed Weights   02/22/24 1327 02/23/24 0544  Weight: (S) 81.6 kg 80.4 kg    Examination:  General exam: Appears calm and comfortable  Respiratory system: Clear to auscultation. Respiratory effort normal. Cardiovascular system: S1 & S2 heard, RRR.  Gastrointestinal system: Abdomen is soft Central nervous system: Alert and awake Extremities: Bilateral 1-2+ pitting edema Skin: No significant lesions noted Psychiatry: Flat  affect.    Data Reviewed: I have personally reviewed following labs and imaging studies  CBC: Recent Labs  Lab 02/22/24 1550  WBC 8.5  HGB 10.0*  HCT 32.4*  MCV 90.8  PLT 318   Basic Metabolic Panel: Recent Labs  Lab 02/22/24 1550 02/23/24 0408  NA 139 139  K 4.0 4.3  CL 98 99  CO2 34* 32  GLUCOSE 119* 108*  BUN 37* 35*  CREATININE 1.84* 1.65*  CALCIUM  9.5 9.4   GFR: Estimated Creatinine Clearance: 39.9 mL/min (A) (by C-G formula based on SCr of 1.65 mg/dL (H)). Liver Function Tests: Recent Labs  Lab 02/22/24 1644  AST 23  ALT 25  ALKPHOS 114  BILITOT 0.4  PROT 6.6  ALBUMIN  3.8   No results for input(s): LIPASE, AMYLASE in the last 168 hours. No results for input(s): AMMONIA in the last 168 hours. Coagulation Profile: No results for input(s): INR, PROTIME in the last 168 hours. Cardiac Enzymes: No results for input(s): CKTOTAL, CKMB, CKMBINDEX, TROPONINI in the last 168 hours. BNP (last 3 results) Recent Labs    01/29/24 0443  PROBNP 16,192.0*   HbA1C: No results for input(s): HGBA1C in the last 72 hours. CBG: No results for input(s): GLUCAP in the last 168 hours. Lipid Profile: No results for input(s): CHOL, HDL, LDLCALC, TRIG, CHOLHDL, LDLDIRECT in the last 72 hours. Thyroid Function Tests: No results for input(s): TSH, T4TOTAL, FREET4, T3FREE, THYROIDAB in the last 72 hours. Anemia Panel: No results for input(s): VITAMINB12, FOLATE, FERRITIN, TIBC, IRON, RETICCTPCT in the last 72 hours. Sepsis Labs: No results for input(s): PROCALCITON, LATICACIDVEN in the last 168 hours.  No results found for this or any previous visit (from the past 240 hours).       Radiology Studies: DG Chest 2 View Result Date: 02/22/2024 CLINICAL DATA:  Chest pain EXAM: CHEST - 2 VIEW COMPARISON:  January 29, 2024 FINDINGS: Stable cardiomediastinal silhouette. Status post coronary bypass graft. Left-sided  pacemaker is unchanged. Both lungs are clear. The visualized skeletal structures are unremarkable. IMPRESSION: No active cardiopulmonary disease. Electronically Signed   By: Lynwood Landy Raddle M.D.   On: 02/22/2024 14:20        Scheduled Meds:  apixaban   5 mg Oral BID   atorvastatin   40 mg Oral QPM   furosemide   60 mg Intravenous BID   metoprolol  tartrate  25 mg Oral BID   pantoprazole   40 mg Oral BID   potassium chloride   40 mEq Oral BID   senna-docusate  1 tablet Oral QHS   Continuous Infusions:  albumin  human 25 g (02/22/24 2358)     LOS: 1 day    Time spent: 55 minutes    Zyann Mabry D Maree, DO [facility]  If 7PM-7AM, please contact night-coverage [email or web address] 02/23/2024, 7:46 AM  \ "

## 2024-02-24 LAB — CBC
HCT: 28.2 % — ABNORMAL LOW (ref 39.0–52.0)
Hemoglobin: 8.7 g/dL — ABNORMAL LOW (ref 13.0–17.0)
MCH: 27.9 pg (ref 26.0–34.0)
MCHC: 30.9 g/dL (ref 30.0–36.0)
MCV: 90.4 fL (ref 80.0–100.0)
Platelets: 264 K/uL (ref 150–400)
RBC: 3.12 MIL/uL — ABNORMAL LOW (ref 4.22–5.81)
RDW: 16.2 % — ABNORMAL HIGH (ref 11.5–15.5)
WBC: 7.3 K/uL (ref 4.0–10.5)
nRBC: 0 % (ref 0.0–0.2)

## 2024-02-24 LAB — BASIC METABOLIC PANEL WITH GFR
Anion gap: 7 (ref 5–15)
BUN: 37 mg/dL — ABNORMAL HIGH (ref 8–23)
CO2: 32 mmol/L (ref 22–32)
Calcium: 9.2 mg/dL (ref 8.9–10.3)
Chloride: 100 mmol/L (ref 98–111)
Creatinine, Ser: 1.83 mg/dL — ABNORMAL HIGH (ref 0.61–1.24)
GFR, Estimated: 38 mL/min — ABNORMAL LOW
Glucose, Bld: 96 mg/dL (ref 70–99)
Potassium: 4.4 mmol/L (ref 3.5–5.1)
Sodium: 139 mmol/L (ref 135–145)

## 2024-02-24 LAB — MAGNESIUM: Magnesium: 2.2 mg/dL (ref 1.7–2.4)

## 2024-02-24 MED ORDER — TORSEMIDE 20 MG PO TABS
30.0000 mg | ORAL_TABLET | Freq: Every day | ORAL | Status: DC
  Administered 2024-02-24 – 2024-02-26 (×3): 30 mg via ORAL
  Filled 2024-02-24: qty 2

## 2024-02-24 NOTE — Plan of Care (Signed)
  Problem: Clinical Measurements: Goal: Ability to maintain clinical measurements within normal limits will improve Outcome: Progressing   Problem: Clinical Measurements: Goal: Diagnostic test results will improve Outcome: Progressing   Problem: Clinical Measurements: Goal: Cardiovascular complication will be avoided Outcome: Progressing   

## 2024-02-24 NOTE — Progress Notes (Addendum)
 CM conducted brief assessment / chart review revealing patient s/p TKA on 01/16/2024, with subsequent admission 11/25 - 01/30/24 for HCAP & LLE Cellulitis, transition to CIR for Acute Rehab and eventually discharging home on 02/08/24 with [facility] HHPT & OT.  Per TOC Initial Assessment Note at CIR on 01/31/24: the patient lives alone in his even-level home. His wife passed away 4 years ago. He has DME in the home such as a walker, rollator, shower seat and 2 canes. He started [facility] services with [facility] prior to admission - will reinstate services as applicable - patient/family requesting to start with [facility] and transition to OP. William Hatfield has the support of his daughters and granddaughter who all live within 10-20 minutes from him.  IPCM team will continue to follow along and monitor patient advancement through interdisciplinary progression rounds.  If new patient transition needs arise, please enter a ICM consult to prompt IPCM team to follow up.    02/24/24 1509  TOC Brief Assessment  Insurance and Status Reviewed  Patient has primary care physician Yes  Home environment has been reviewed Home with Dtr - [name]  Prior level of function: Independent with assist devices  Prior/Current Home Services Current home services ([facility]PT & OT)  Social Drivers of Health Review SDOH reviewed no interventions necessary  Readmission risk has been reviewed Yes  Transition of care needs transition of care needs identified, TOC will continue to follow

## 2024-02-24 NOTE — Progress Notes (Signed)
 Patient refuses bed alarm at this time.  Patient states he feels safe and is able to ambulate to restroom. Patient does use cane with ambulation.  Educated patient on usage of call light and bed alarm.

## 2024-02-24 NOTE — Progress Notes (Signed)
 " PROGRESS NOTE    William Hatfield  FMW:[id number] DOB: 1945-07-09 DOA: 02/22/2024 PCP: Henriette Anes, DO   Brief Narrative:    William Hatfield is a 78 y.o. male with medical history significant of atrial fibrillation on anticoagulation, coronary artery disease with history of NSTEMI, CKD, third-degree AV block, hypertension, peripheral vascular disease, recent left knee total arthroplasty.  Patient has been having difficulty with lower extremity swelling since his surgery.  He was admitted for and placed on antibiotics due to erythema and swelling in his lower extremities bilaterally.  In addition to the antibiotics, he has had ultrasound of his lower extremities which was negative for DVT.  His last ultrasound was 4 days ago.  He was sent to the hospital by his cardiology office due to elevated labs.  Patient was admitted for acute on chronic diastolic CHF with peripheral edema and was started on Lasix  as well as some albumin .  Now with increasing creatinine levels, he still has edema, but will be trialed on an increased dose of home torsemide  30 mg today to see his response.  Assessment & Plan:   Principal Problem:   Acute exacerbation of CHF (congestive heart failure) (HCC) Active Problems:   Hypertension   CAD (coronary artery disease)   Hx of CABG   PAF (paroxysmal atrial fibrillation) (HCC)   CKD (chronic kidney disease)  Assessment and Plan:   Acute exacerbation of CHF with peripheral edema-improving Initially, the patient had soft blood pressures Patient is definitely fluid overloaded, with intravascular fluid depletion I do not feel that this represents DVT (recent normal DVT study - 12/22).  I also do not feel that this represents cellulitis as the erythemic spots are not hot to the touch and do not blanch with pressure.  I think this largely represents peripheral edema Patient's hepatic panel is pending.  His recent CMP showed an albumin  of 2.3.  Patient is likely unable to draw fluid  from the periphery into the intravascular space. Hold further IV Lasix  Potassium At this point, the patient does not need updated echocardiogram as his last echocardiogram was approximately 1 month ago Weight has decreased to 177>170 pounds and patient is unsure of baseline weight and creatinine is increasing therefore IV Lasix  held and patient started on increased dose of torsemide  30 mg as opposed to 20 mg which was his prior dosage.  Plan to assess response to this and can discharge on this dose by a.m. if diuresing.  Urine outputs are not being accurately recorded. CKD Creatinine slightly more elevated than previous.  Creatinine levels have been increasing and therefore IV Lasix  held Repeat BMP daily Coronary artery disease with PAF Continue Eliquis  Hypertension Continue blood pressure control Recent arthroplasty-obtain PT/OT evaluation-still pending    DVT prophylaxis:Eliquis  Code Status: Full Family Communication: None at bedside Disposition Plan:  Status is: Inpatient Remains inpatient appropriate because: Need for IV medications.   Consultants:  None  Procedures:  None  Antimicrobials:  None   Subjective: Patient seen and evaluated today with improvement related to diuresis.  He states that his leg swelling is much improved and he is overall feeling better.  He is having some minimal difficulty ambulating given his recent knee surgery.  Objective: Vitals:   02/23/24 2032 02/24/24 0459 02/24/24 1036 02/24/24 1255  BP: (!) 123/58 (!) 120/59 109/60 107/63  Pulse: 61 60 60 62  Resp: 18   16  Temp: 97.8 F (36.6 C) 98.4 F (36.9 C)  97.7 F (36.5 C)  TempSrc:  Oral Oral  Oral  SpO2: 91% 98% 98% 98%  Weight:  77.2 kg    Height:        Intake/Output Summary (Last 24 hours) at 02/24/2024 1325 Last data filed at 02/24/2024 1000 Gross per 24 hour  Intake 240 ml  Output 875 ml  Net -635 ml   Filed Weights   02/22/24 1327 02/23/24 0544 02/24/24 0459  Weight:  (S) 81.6 kg 80.4 kg 77.2 kg    Examination:  General exam: Appears calm and comfortable  Respiratory system: Clear to auscultation. Respiratory effort normal. Cardiovascular system: S1 & S2 heard, RRR.  Gastrointestinal system: Abdomen is soft Central nervous system: Alert and awake Extremities: Bilateral 1-2+ pitting edema Skin: No significant lesions noted Psychiatry: Flat affect.    Data Reviewed: I have personally reviewed following labs and imaging studies  CBC: Recent Labs  Lab 02/22/24 1550 02/24/24 0405  WBC 8.5 7.3  HGB 10.0* 8.7*  HCT 32.4* 28.2*  MCV 90.8 90.4  PLT 318 264   Basic Metabolic Panel: Recent Labs  Lab 02/22/24 1550 02/23/24 0408 02/24/24 0405  NA 139 139 139  K 4.0 4.3 4.4  CL 98 99 100  CO2 34* 32 32  GLUCOSE 119* 108* 96  BUN 37* 35* 37*  CREATININE 1.84* 1.65* 1.83*  CALCIUM  9.5 9.4 9.2  MG  --   --  2.2   GFR: Estimated Creatinine Clearance: 36 mL/min (A) (by C-G formula based on SCr of 1.83 mg/dL (H)). Liver Function Tests: Recent Labs  Lab 02/22/24 1644  AST 23  ALT 25  ALKPHOS 114  BILITOT 0.4  PROT 6.6  ALBUMIN  3.8   No results for input(s): LIPASE, AMYLASE in the last 168 hours. No results for input(s): AMMONIA in the last 168 hours. Coagulation Profile: No results for input(s): INR, PROTIME in the last 168 hours. Cardiac Enzymes: No results for input(s): CKTOTAL, CKMB, CKMBINDEX, TROPONINI in the last 168 hours. BNP (last 3 results) Recent Labs    01/29/24 0443  PROBNP 16,192.0*   HbA1C: No results for input(s): HGBA1C in the last 72 hours. CBG: No results for input(s): GLUCAP in the last 168 hours. Lipid Profile: No results for input(s): CHOL, HDL, LDLCALC, TRIG, CHOLHDL, LDLDIRECT in the last 72 hours. Thyroid Function Tests: No results for input(s): TSH, T4TOTAL, FREET4, T3FREE, THYROIDAB in the last 72 hours. Anemia Panel: No results for input(s):  VITAMINB12, FOLATE, FERRITIN, TIBC, IRON, RETICCTPCT in the last 72 hours. Sepsis Labs: No results for input(s): PROCALCITON, LATICACIDVEN in the last 168 hours.  No results found for this or any previous visit (from the past 240 hours).       Radiology Studies: DG Chest 2 View Result Date: 02/22/2024 CLINICAL DATA:  Chest pain EXAM: CHEST - 2 VIEW COMPARISON:  January 29, 2024 FINDINGS: Stable cardiomediastinal silhouette. Status post coronary bypass graft. Left-sided pacemaker is unchanged. Both lungs are clear. The visualized skeletal structures are unremarkable. IMPRESSION: No active cardiopulmonary disease. Electronically Signed   By: Lynwood Landy Raddle M.D.   On: 02/22/2024 14:20        Scheduled Meds:  apixaban   5 mg Oral BID   atorvastatin   40 mg Oral QPM   metoprolol  tartrate  25 mg Oral BID   pantoprazole   40 mg Oral BID   potassium chloride   40 mEq Oral BID   senna-docusate  1 tablet Oral QHS   torsemide   30 mg Oral Daily      LOS: 2 days  Time spent: 55 minutes    William Hatfield William Fairly, DO [facility]  If 7PM-7AM, please contact night-coverage [email or web address] 02/24/2024, 1:25 PM  \ "

## 2024-02-25 ENCOUNTER — Other Ambulatory Visit: Payer: Self-pay | Admitting: *Deleted

## 2024-02-25 LAB — CBC
HCT: 28.7 % — ABNORMAL LOW (ref 39.0–52.0)
Hemoglobin: 8.8 g/dL — ABNORMAL LOW (ref 13.0–17.0)
MCH: 27.6 pg (ref 26.0–34.0)
MCHC: 30.7 g/dL (ref 30.0–36.0)
MCV: 90 fL (ref 80.0–100.0)
Platelets: 280 K/uL (ref 150–400)
RBC: 3.19 MIL/uL — ABNORMAL LOW (ref 4.22–5.81)
RDW: 15.9 % — ABNORMAL HIGH (ref 11.5–15.5)
WBC: 7.9 K/uL (ref 4.0–10.5)
nRBC: 0 % (ref 0.0–0.2)

## 2024-02-25 LAB — BASIC METABOLIC PANEL WITH GFR
Anion gap: 7 (ref 5–15)
BUN: 35 mg/dL — ABNORMAL HIGH (ref 8–23)
CO2: 33 mmol/L — ABNORMAL HIGH (ref 22–32)
Calcium: 9.3 mg/dL (ref 8.9–10.3)
Chloride: 99 mmol/L (ref 98–111)
Creatinine, Ser: 1.73 mg/dL — ABNORMAL HIGH (ref 0.61–1.24)
GFR, Estimated: 40 mL/min — ABNORMAL LOW
Glucose, Bld: 106 mg/dL — ABNORMAL HIGH (ref 70–99)
Potassium: 4.6 mmol/L (ref 3.5–5.1)
Sodium: 138 mmol/L (ref 135–145)

## 2024-02-25 LAB — MAGNESIUM: Magnesium: 2.5 mg/dL — ABNORMAL HIGH (ref 1.7–2.4)

## 2024-02-25 NOTE — Plan of Care (Signed)

## 2024-02-25 NOTE — Progress Notes (Signed)
 " PROGRESS NOTE    William Hatfield  FMW:[id number] DOB: [birth year] DOA: 02/22/2024 PCP: Henriette Anes, DO   Brief Narrative:   78 y.o. M with CAD status post CABG 2006 at [facility], [place] on Eliquis , HFpEF, history CHB with PPM 2021, HTN, carotid artery disease s/p right TCAR 2024, history of remote left CEA, CKD 3A, baseline 1.3-1.5, stage III sacral decub, and recent left knee TKA in Nov who presented with lower extremity swelling bilaterally since surgery.  Admitted to hospital late Nov for LE swelling, diagnosed with bilateral LE cellulitis, treated with antibiotics, ultimately diagnosed with HCAP, discharge to CIR.    Since discharge from rehab, he has had persistent lower extremity swelling.  Had ultrasound of lower extremities a few days prior to admission, then sent to hospital from cardiology office due to elevated creatinine and BNP.  Admitted here for CHF.    Initially started here on IV Lasix , then transition to torsemide  on 12/28, and seems to be improving.     Assessment & Plan:   Principal Problem:   Acute exacerbation of CHF (congestive heart failure) (HCC) Active Problems:   Hypertension   CAD (coronary artery disease)   Hx of CABG   PAF (paroxysmal atrial fibrillation) (HCC)   CKD (chronic kidney disease)  Assessment and Plan:   Acute exacerbation of CHF with peripheral edema Echo last month showed EF 50 to 55% unremarkable valves.    Admitted here and started on IV Lasix , transition to torsemide  12/28.   Net -1.4 L yesterday, there was some concern for inaccurate I's and O's.  Weights discordant, by weight the patient has gained 5 pounds in the last 24 hours.  Overall he has lost 5 pounds from admission.  Still quite edematous on exam, but breathing improving with exertion - Continue torsemide  30 mg daily (higher than home dose) - Daily BMP - Supplement potassium as needed   Leg swelling As per Dr. Jamal notes, doubt DVT given recent study on 12/22, and doubt  infection.  Likely CHF.    AKI on CKD IIIa Baseline creatinine appears to be in the 1.3-1.7 range recently.  Here creatinine 2.2 on admission, has trended down with diuresis. - Daily BMP   Coronary artery disease Hypertension Peripheral vascular disease -Continue Eliquis , Lipitor, metoprolol   Sacral decubitus ulcer, stage III, present on admission - WOC  Paroxysmal atrial fibrillation Rate controlled - Continue metoprolol , apixaban   Hypertension Blood pressure controlled - Continue metoprolol   Recent arthroplasty Did well with PT  Normocytic anemia Due to CKD, stable   DVT prophylaxis:Eliquis  Code Status: Full Family Communication: None at bedside Disposition Plan:  Status is: Inpatient  The patient was admitted and started on diuresis.  He is diuresing well with torsemide , but is still substantially fluid overloaded, and lives alone.  If creatinine improving tomorrow, and swelling improved, likely home tomorrow with close outpatient follow-up   Consultants:  None  Procedures:  None  Antimicrobials:  None   Subjective: Leg still severely edematous.  But he feels that he is getting better overall  Objective: Vitals:   02/24/24 1036 02/24/24 1255 02/24/24 2045 02/25/24 0419  BP: 109/60 107/63 (!) 123/58 124/61  Pulse: 60 62  61  Resp:  16 18 18   Temp:  97.7 F (36.5 C) 97.9 F (36.6 C) 97.8 F (36.6 C)  TempSrc:  Oral Oral Oral  SpO2: 98% 98% 98% 96%  Weight:    79.7 kg  Height:        Intake/Output Summary (  Last 24 hours) at 02/25/2024 0731 Last data filed at 02/25/2024 0528 Gross per 24 hour  Intake --  Output 1475 ml  Net -1475 ml   Filed Weights   02/23/24 0544 02/24/24 0459 02/25/24 0419  Weight: 80.4 kg 77.2 kg 79.7 kg    Examination:  General exam: Appears calm and comfortable  Respiratory system: Clear to auscultation. Respiratory effort normal. Cardiovascular system: S1 & S2 heard, RRR.  Gastrointestinal system: Abdomen is  soft Central nervous system: Alert and awake Extremities: 2+ edema to the midshin bilaterally Skin: No significant lesions noted Psychiatry: Oriented x4    Data Reviewed: I have personally reviewed following labs and imaging studies  CBC: Recent Labs  Lab 02/22/24 1550 02/24/24 0405 02/25/24 0346  WBC 8.5 7.3 7.9  HGB 10.0* 8.7* 8.8*  HCT 32.4* 28.2* 28.7*  MCV 90.8 90.4 90.0  PLT 318 264 280   Basic Metabolic Panel: Recent Labs  Lab 02/22/24 1550 02/23/24 0408 02/24/24 0405 02/25/24 0346  NA 139 139 139 138  K 4.0 4.3 4.4 4.6  CL 98 99 100 99  CO2 34* 32 32 33*  GLUCOSE 119* 108* 96 106*  BUN 37* 35* 37* 35*  CREATININE 1.84* 1.65* 1.83* 1.73*  CALCIUM  9.5 9.4 9.2 9.3  MG  --   --  2.2 2.5*   GFR: Estimated Creatinine Clearance: 38.1 mL/min (A) (by C-G formula based on SCr of 1.73 mg/dL (H)). Liver Function Tests: Recent Labs  Lab 02/22/24 1644  AST 23  ALT 25  ALKPHOS 114  BILITOT 0.4  PROT 6.6  ALBUMIN  3.8   No results for input(s): LIPASE, AMYLASE in the last 168 hours. No results for input(s): AMMONIA in the last 168 hours. Coagulation Profile: No results for input(s): INR, PROTIME in the last 168 hours. Cardiac Enzymes: No results for input(s): CKTOTAL, CKMB, CKMBINDEX, TROPONINI in the last 168 hours. BNP (last 3 results) Recent Labs    01/29/24 0443  PROBNP 16,192.0*   HbA1C: No results for input(s): HGBA1C in the last 72 hours. CBG: No results for input(s): GLUCAP in the last 168 hours. Lipid Profile: No results for input(s): CHOL, HDL, LDLCALC, TRIG, CHOLHDL, LDLDIRECT in the last 72 hours. Thyroid Function Tests: No results for input(s): TSH, T4TOTAL, FREET4, T3FREE, THYROIDAB in the last 72 hours. Anemia Panel: No results for input(s): VITAMINB12, FOLATE, FERRITIN, TIBC, IRON, RETICCTPCT in the last 72 hours. Sepsis Labs: No results for input(s): PROCALCITON, LATICACIDVEN  in the last 168 hours.  No results found for this or any previous visit (from the past 240 hours).       Radiology Studies: No results found.       Scheduled Meds:  apixaban   5 mg Oral BID   atorvastatin   40 mg Oral QPM   metoprolol  tartrate  25 mg Oral BID   pantoprazole   40 mg Oral BID   potassium chloride   40 mEq Oral BID   senna-docusate  1 tablet Oral QHS   torsemide   30 mg Oral Daily      LOS: 3 days    Time spent: 35 minutes    Lonni SHAUNNA Dalton, MD [facility]  If 7PM-7AM, please contact night-coverage [email or web address] 02/25/2024, 7:31 AM  \ "

## 2024-02-25 NOTE — Progress Notes (Signed)
 Heart Failure Nurse Navigator Progress Note  PCP: Henriette Anes, DO PCP-Cardiologist: [name] Sierras, MD Admission Diagnosis: Acute on Chronic Heart Failure, unspecified heart failure type [facility]) Admitted from: [facility] Patient: Patient was called remotely to room 310 @ [facility].  2 Patient identifiers confirmed prior to providing him with CHF education and scheduling his CHF TOC appointment.  Presentation:   William Hatfield is a 78 y.o. male.  He  was sent to [facility] from his cardiologist office with abnormal lab results.  He had increasing fluid since recent knee surgery on 01/16/24. His Lasix  had been changed to torsemide .  Creatinine was 2.2 up from 1.3 (2 weeks ago).  He also had some shortness of breath and difficulty walking. No fevers or chest pain.  Had been on different antibiotics for leg redness cellulitis. Chest x-ray: Mild pulmonary edema with similar to mildly decreased patchy opacities in the left mid to lower lung, consistent with pneumonia.  Trace bilateral pleural effusions. HS-Troponin 83. Pro-BNP 9,577.0  ECHO/ LVEF: Last ECHO 01/24/24 was 50-55%  Clinical Course:  Past Medical History:  Diagnosis Date   Anginal pain    Arthritis    Atrial fibrillation (HCC)    a. post op in 2006  b. recurrence on 08/2016 admission for NSTEMI   CAD (coronary artery disease)    a. 2006: s/p CABG at [facility] with LIMA to LAD, SVG to PDA, SVG to OM1, SVG to D1  b. 08/2016: NSTEMI 09/18/16 which showed severe native 3V CAD with patent SVG-->PLA, LIMA-->LAD and total occlusion of SVG--> OM1 with unsuccessful attempt at PCI of SVG--> OM with inability to restore flow.    Cancer (HCC)    Rt. arm,head squamus.   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   Essential hypertension    Myocardial infarction (HCC) 09/18/2016   Peripheral vascular disease    Pneumonia 2020   COVID PNA    Presence of permanent cardiac pacemaker    Tremor      Social History   Socioeconomic History   Marital status: Widowed    Spouse name: Not on file   Number of children: 2   Years of education: Not on file   Highest education level: Not on file  Occupational History   Not on file  Tobacco Use   Smoking status: Former    Current packs/day: 0.00    Average packs/day: 4.0 packs/day for 20.0 years (80.0 ttl pk-yrs)    Types: Cigarettes    Start date: 02/28/1972    Quit date: 02/28/1992    Years since quitting: 32.0   Smokeless tobacco: Former    Types: Snuff, Chew    Quit date: 02/27/1981  Vaping Use   Vaping status: Never Used  Substance and Sexual Activity   Alcohol use: No    Alcohol/week: 0.0 standard drinks of alcohol   Drug use: No   Sexual activity: Not Currently  Other Topics Concern   Not on file  Social History Narrative   Not on file   Social Drivers of Health   Tobacco Use: Medium Risk (02/22/2024)   Patient History    Smoking Tobacco Use: Former    Smokeless Tobacco Use: Former    Passive Exposure: Not on Actuary Strain: Not on file  Food Insecurity: No Food Insecurity (02/22/2024)   Epic  Worried About Programme Researcher, Broadcasting/film/video in the Last Year: Never true    Ran Out of Food in the Last Year: Never true  Transportation Needs: No Transportation Needs (02/22/2024)   Epic    Lack of Transportation (Medical): No    Lack of Transportation (Non-Medical): No  Physical Activity: Not on file  Stress: Not on file  Social Connections: Moderately Isolated (02/22/2024)   Social Connection and Isolation Panel    Frequency of Communication with Friends and Family: More than three times a week    Frequency of Social Gatherings with Friends and Family: More than three times a week    Attends Religious Services: 1 to 4 times per year    Active Member of [employer] or Organizations: No    Attends Banker Meetings: Never    Marital Status: Widowed   Depression (PHQ2-9): Not on file  Alcohol Screen: Not on file  Housing: Low Risk (02/22/2024)   Epic    Unable to Pay for Housing in the Last Year: No    Number of Times Moved in the Last Year: 0    Homeless in the Last Year: No  Utilities: Not At Risk (02/22/2024)   Epic    Threatened with loss of utilities: No  Health Literacy: Not on file   Education Assessment and Provision: Detailed education and instructions provided on heart failure disease management including the following:  Signs and symptoms of Heart Failure When to call the physician Importance of daily weights Low sodium diet Fluid restriction Medication management Anticipated future follow-up appointments  Patient education given on each of the above topics.  Patient acknowledges understanding via teach back method and acceptance of all instructions.  Education Materials:  Living Better With Heart Failure Booklet, HF zone tool, & Daily Weight Tracker Tool.  Patient has scale at home: Yes Patient has pill box at home: Yes    High Risk Criteria for Readmission and/or Poor Patient Outcomes: Heart failure hospital admissions (last 6 months): 1  No Show rate: 1% Difficult social situation: None determined at this time. Demonstrates medication adherence: Yes.  Uses a pill box and takes all medications. Primary Language: English Literacy level: Reading, Writing & Comprehension  Barriers of Care:   Daily Weights-not currently doing them prior to this admission.  Shared that he will start now. Diet & Fluid Restrictions-shared that his family cooks for him.    Considerations/Referrals:  Referral made to Heart Failure Pharmacist Stewardship: N/A Referral made to Heart Failure CSW/NCM TOC: No Referral made to Heart & Vascular TOC clinic: Yes.  CHF [facility] 03/03/24 @ 3:30 PM  Items for Follow-up on DC/TOC: Daily Weights Diet & Fluid Restrictions Continued Heart Failure Education  Charmaine Pines,  RN, BSN [facility] Heart Failure Navigator Secure Chat Only

## 2024-02-26 DIAGNOSIS — I5033 Acute on chronic diastolic (congestive) heart failure: Secondary | ICD-10-CM

## 2024-02-26 LAB — BASIC METABOLIC PANEL WITH GFR
Anion gap: 7 (ref 5–15)
BUN: 34 mg/dL — ABNORMAL HIGH (ref 8–23)
CO2: 33 mmol/L — ABNORMAL HIGH (ref 22–32)
Calcium: 9.5 mg/dL (ref 8.9–10.3)
Chloride: 98 mmol/L (ref 98–111)
Creatinine, Ser: 1.68 mg/dL — ABNORMAL HIGH (ref 0.61–1.24)
GFR, Estimated: 42 mL/min — ABNORMAL LOW
Glucose, Bld: 105 mg/dL — ABNORMAL HIGH (ref 70–99)
Potassium: 4.8 mmol/L (ref 3.5–5.1)
Sodium: 138 mmol/L (ref 135–145)

## 2024-02-26 MED ORDER — TORSEMIDE 10 MG PO TABS: 30.0000 mg | ORAL_TABLET | Freq: Every day | ORAL | 0 refills | Status: DC

## 2024-02-26 MED ORDER — HYDRALAZINE HCL 20 MG/ML IJ SOLN: 10.0000 mg | INTRAMUSCULAR | Status: DC | PRN

## 2024-02-26 MED ORDER — TRAZODONE HCL 50 MG PO TABS: 50.0000 mg | ORAL_TABLET | Freq: Every evening | ORAL | Status: DC | PRN

## 2024-02-26 MED ORDER — ONDANSETRON HCL 4 MG/2ML IJ SOLN: 4.0000 mg | Freq: Four times a day (QID) | INTRAMUSCULAR | Status: DC | PRN

## 2024-02-26 MED ORDER — GUAIFENESIN 100 MG/5ML PO LIQD: 5.0000 mL | ORAL | Status: DC | PRN

## 2024-02-26 MED ORDER — IPRATROPIUM-ALBUTEROL 0.5-2.5 (3) MG/3ML IN SOLN: 3.0000 mL | RESPIRATORY_TRACT | Status: DC | PRN

## 2024-02-26 MED ORDER — METOPROLOL TARTRATE 5 MG/5ML IV SOLN: 5.0000 mg | INTRAVENOUS | Status: DC | PRN

## 2024-02-26 MED ORDER — GLUCAGON HCL RDNA (DIAGNOSTIC) 1 MG IJ SOLR: 1.0000 mg | INTRAMUSCULAR | Status: DC | PRN

## 2024-02-26 NOTE — Discharge Summary (Signed)
 Physician Discharge Summary  William Hatfield FMW:[id number] DOB: 1945/10/31 DOA: 02/22/2024  PCP: Henriette Anes, DO  Admit date: 02/22/2024 Discharge date: 02/26/2024  Admitted From: Home Disposition: Home  Recommendations for Outpatient Follow-up:  Follow up with PCP in 1-2 weeks Please obtain BMP/CBC as magnesium  in next 3 days Torsemide  is changed to 30 mg daily Outpatient follow-up with cardiology  H Discharge Condition: Stable CODE STATUS: Full code Diet recommendation: Healthy diet  Brief/Interim Summary: Brief Narrative:   78 y.o. M with CAD status post CABG 2006 at [facility], PAF on Eliquis , HFpEF, history CHB with PPM 2021, HTN, carotid artery disease s/p right TCAR 2024, history of remote left CEA, CKD 3A, baseline 1.3-1.5, stage III sacral decub, and recent left knee TKA in Nov who presented with lower extremity swelling bilaterally since surgery.   Admitted to hospital late Nov for LE swelling, diagnosed with bilateral LE cellulitis, treated with antibiotics, ultimately diagnosed with HCAP, discharge to CIR.     Since discharge from rehab, he has had persistent lower extremity swelling.  Had ultrasound of lower extremities a few days prior to admission, then sent to hospital from cardiology office due to elevated creatinine and BNP.  Admitted here for CHF.     Initially started here on IV Lasix , then transition to torsemide  on 12/28, and seems to be improving.  Today patient feels great he wants to go home.  Assessment & Plan:     Congestive heart failure with acute exacerbation, preserved EF 55% Bilateral lower extremity swelling Echo last month showed EF 50 to 55% unremarkable valves.   -Patient admitted due to volume overload started on IV Lasix  initially and responded well.  Slowly transition to p.o. torsemide .    Leg swelling -From CHF exacerbation.  Recent DVT studies are negative     CKD IIIa Creatinine is around baseline of 1.7   Coronary artery  disease Hypertension Peripheral vascular disease -Continue Eliquis , Lipitor, metoprolol    Sacral decubitus ulcer, stage III, present on admission - WOC   Paroxysmal atrial fibrillation Rate controlled - Continue metoprolol , apixaban    Hypertension Blood pressure controlled - Continue metoprolol .  IV as needed   Recent arthroplasty Did well with PT   Normocytic anemia Due to CKD, stable     DVT prophylaxis:Eliquis  Code Status: Full Family Communication: None at bedside Disposition Plan:  Status is: Inpatient   Discharge  Subjective: Feels great wanting to go home   Examination:  General exam: Appears calm and comfortable  Respiratory system: Clear to auscultation. Respiratory effort normal. Cardiovascular system: S1 & S2 heard, RRR. No JVD, murmurs, rubs, gallops or clicks. No pedal edema. Gastrointestinal system: Abdomen is nondistended, soft and nontender. No organomegaly or masses felt. Normal bowel sounds heard. Central nervous system: Alert and oriented. No focal neurological deficits. Extremities: Symmetric 5 x 5 power. Skin: No rashes, lesions or ulcers Psychiatry: Judgement and insight appear normal. Mood & affect appropriate.    Discharge Diagnoses:  Principal Problem:   Acute exacerbation of CHF (congestive heart failure) (HCC) Active Problems:   Hypertension   CAD (coronary artery disease)   Hx of CABG   PAF (paroxysmal atrial fibrillation) (HCC)   CKD (chronic kidney disease)      Discharge Exam: Vitals:   02/26/24 0758 02/26/24 0808  BP: 124/61   Pulse: 61 70  Resp:    Temp:    SpO2: 98%    Vitals:   02/25/24 2100 02/26/24 0405 02/26/24 0758 02/26/24 0808  BP: (!) 107/48 120/66  124/61   Pulse: 64 80 61 70  Resp:  18    Temp: 97.9 F (36.6 C) 98.5 F (36.9 C)    TempSrc: Oral Oral    SpO2: 100% 97% 98%   Weight:  79.7 kg    Height:          Discharge Instructions  Discharge Instructions     Diet Heart Room service  appropriate? Yes; Fluid consistency: Thin   Complete by: As directed    Discharge patient   Complete by: As directed    Discharge disposition: 01-Home or Self Care   Discharge patient date: 02/26/2024      Allergies as of 02/26/2024       Reactions   Niaspan [niacin] Hives, Itching, Other (See Comments)   NIASPAN ONLY        Medication List     TAKE these medications    acetaminophen  500 MG tablet Commonly known as: TYLENOL  Take 500-1,000 mg by mouth every 6 (six) hours as needed for fever or mild pain (pain score 1-3) (headaches, or discomfort).   allopurinol  100 MG tablet Commonly known as: ZYLOPRIM  Take 1 tablet (100 mg total) by mouth in the morning.   atorvastatin  40 MG tablet Commonly known as: LIPITOR Take 1 tablet (40 mg total) by mouth every evening.   docusate sodium  100 MG capsule Commonly known as: COLACE Take 1 capsule (100 mg total) by mouth 2 (two) times daily. What changed: when to take this   Eliquis  5 MG Tabs tablet Generic drug: apixaban  Take 1 tablet (5 mg total) by mouth 2 (two) times daily.   metoprolol  tartrate 25 MG tablet Commonly known as: LOPRESSOR  Take 1 tablet (25 mg total) by mouth 2 (two) times daily.   nitroGLYCERIN  0.4 MG SL tablet Commonly known as: NITROSTAT  DISSOLVE 1 TABLET UNDER THE TONGUE EVERY 5 MINUTES FOR 3 DOSES AS NEEDED FOR CHEST PAIN AS DIRECTED.   pantoprazole  40 MG tablet Commonly known as: PROTONIX  Take 1 tablet (40 mg total) by mouth 2 (two) times daily.   polyethylene glycol 17 g packet Commonly known as: MIRALAX  / GLYCOLAX  Take 17 g by mouth daily. What changed:  when to take this reasons to take this   torsemide  10 MG tablet Commonly known as: DEMADEX  Take 3 tablets (30 mg total) by mouth daily. Start taking on: February 27, 2024 What changed:  medication strength how much to take        Contact information for follow-up providers     [facility]. Go on 03/03/2024.   Specialty: Cardiology Why: Hospital Follow-Up Please bring all medications to follow-up appointment [facility], [address], [place], [place]. Entrance C off of [address] [employer] Parking at the door. Or use GATE CODE 2026 to park right under the buidling. Contact information: [address] [place]  [address] [phone]        Henriette Anes, DO Follow up in 1 week(s).   Specialty: Family Medicine Contact information: [address] [place] [address] [phone]              Contact information for after-discharge care     Home Medical Care     [facility]) .   Service: Home Health Services                    Allergies[1]  You were cared for by a hospitalist  during your hospital stay. If you have any questions about your discharge medications or the care you received while you were in the hospital after you are discharged, you can call the unit and asked to speak with the hospitalist on call if the hospitalist that took care of you is not available. Once you are discharged, your primary care physician will handle any further medical issues. Please note that no refills for any discharge medications will be authorized once you are discharged, as it is imperative that you return to your primary care physician (or establish a relationship with a primary care physician if you do not have one) for your aftercare needs so that they can reassess your need for medications and monitor your lab values.  You were cared for by a hospitalist during your hospital stay. If you have any questions about your discharge medications or the care you received while you were in the hospital after you are discharged, you can call the unit and asked to speak with the hospitalist on call if the hospitalist that took care of you is not available. Once you are discharged, your primary care  physician will handle any further medical issues. Please note that NO REFILLS for any discharge medications will be authorized once you are discharged, as it is imperative that you return to your primary care physician (or establish a relationship with a primary care physician if you do not have one) for your aftercare needs so that they can reassess your need for medications and monitor your lab values.  Please request your Prim.MD to go over all Hospital Tests and Procedure/Radiological results at the follow up, please get all Hospital records sent to your Prim MD by signing hospital release before you go home.  Get CBC, CMP, 2 view Chest X ray checked  by Primary MD during your next visit or SNF MD in 5-7 days ( we routinely change or add medications that can affect your baseline labs and fluid status, therefore we recommend that you get the mentioned basic workup next visit with your PCP, your PCP may decide not to get them or add new tests based on their clinical decision)  On your next visit with your primary care physician please Get Medicines reviewed and adjusted.  If you experience worsening of your admission symptoms, develop shortness of breath, life threatening emergency, suicidal or homicidal thoughts you must seek medical attention immediately by calling 911 or calling your MD immediately  if symptoms less severe.  You Must read complete instructions/literature along with all the possible adverse reactions/side effects for all the Medicines you take and that have been prescribed to you. Take any new Medicines after you have completely understood and accpet all the possible adverse reactions/side effects.   Do not drive, operate heavy machinery, perform activities at heights, swimming or participation in water  activities or provide baby sitting services if your were admitted for syncope or siezures until you have seen by Primary MD or a Neurologist and advised to do so again.  Do not  drive when taking Pain medications.   Procedures/Studies: DG Chest 2 View Result Date: 02/22/2024 CLINICAL DATA:  Chest pain EXAM: CHEST - 2 VIEW COMPARISON:  January 29, 2024 FINDINGS: Stable cardiomediastinal silhouette. Status post coronary bypass graft. Left-sided pacemaker is unchanged. Both lungs are clear. The visualized skeletal structures are unremarkable. IMPRESSION: No active cardiopulmonary disease. Electronically Signed   By: Lynwood Landy Raddle M.D.   On: 02/22/2024 14:20   VAS US   LOWER EXTREMITY VENOUS (DVT) Result Date: 02/18/2024  Lower Venous DVT Study Patient Name:  William Hatfield  Date of Exam:   02/18/2024 Medical Rec #: [id number]    Accession #:    [id number] Date of Birth: [birth year]    Patient Gender: M Patient Age:   82 years Exam Location:  [facility] Procedure:      VAS US  LOWER EXTREMITY VENOUS (DVT) Referring Phys: ALMARIE PECK --------------------------------------------------------------------------------  Indications: Swelling.  Risk Factors: Left TKA 01/16/2024, Cellulitis. Comparison Study: 01/23/2024 Negative for DVT left leg. Performing Technologist: Bascom Burows RCS, RVS  Examination Guidelines: A complete evaluation includes B-mode imaging, spectral Doppler, color Doppler, and power Doppler as needed of all accessible portions of each vessel. Bilateral testing is considered an integral part of a complete examination. Limited examinations for reoccurring indications may be performed as noted. The reflux portion of the exam is performed with the patient in reverse Trendelenburg.  +---------+---------------+---------+-----------+----------+--------------+ RIGHT    CompressibilityPhasicitySpontaneityPropertiesThrombus Aging +---------+---------------+---------+-----------+----------+--------------+ CFV      Full           Yes      Yes                                 +---------+---------------+---------+-----------+----------+--------------+ SFJ      Full                                                         +---------+---------------+---------+-----------+----------+--------------+ FV Prox  Full                                                        +---------+---------------+---------+-----------+----------+--------------+ FV Mid   Full                                                        +---------+---------------+---------+-----------+----------+--------------+ FV DistalFull                                                        +---------+---------------+---------+-----------+----------+--------------+ PFV      Full                                                        +---------+---------------+---------+-----------+----------+--------------+ POP      Full                                                        +---------+---------------+---------+-----------+----------+--------------+ PTV      Full  Yes      Yes                                 +---------+---------------+---------+-----------+----------+--------------+ PERO     Full                                                        +---------+---------------+---------+-----------+----------+--------------+ Gastroc  Full                                                        +---------+---------------+---------+-----------+----------+--------------+ GSV      Full                                                        +---------+---------------+---------+-----------+----------+--------------+ SSV      Full                                                        +---------+---------------+---------+-----------+----------+--------------+   +---------+---------------+---------+-----------+----------+--------------+ LEFT     CompressibilityPhasicitySpontaneityPropertiesThrombus Aging +---------+---------------+---------+-----------+----------+--------------+ CFV      Full           Yes      Yes                                  +---------+---------------+---------+-----------+----------+--------------+ SFJ      Full                                                        +---------+---------------+---------+-----------+----------+--------------+ FV Prox  Full                                                        +---------+---------------+---------+-----------+----------+--------------+ FV Mid   Full                                                        +---------+---------------+---------+-----------+----------+--------------+ FV DistalFull                                                        +---------+---------------+---------+-----------+----------+--------------+ PFV  Full                                                        +---------+---------------+---------+-----------+----------+--------------+ POP      Full           Yes      Yes                                 +---------+---------------+---------+-----------+----------+--------------+ PTV      Full                                                        +---------+---------------+---------+-----------+----------+--------------+ PERO     Full                                                        +---------+---------------+---------+-----------+----------+--------------+ Gastroc  Full                                                        +---------+---------------+---------+-----------+----------+--------------+ GSV      Full                                                        +---------+---------------+---------+-----------+----------+--------------+ SSV      Full                                                        +---------+---------------+---------+-----------+----------+--------------+     Summary: BILATERAL: - No evidence of deep vein thrombosis seen in the lower extremities, bilaterally. - No evidence of superficial venous thrombosis in the lower extremities, bilaterally. -No  evidence of popliteal cyst, bilaterally.   *See table(s) above for measurements and observations. Electronically signed by Maude Emmer MD on 02/18/2024 at 1:51:02 PM.    Final    DG CHEST PORT 1 VIEW Result Date: 01/29/2024 EXAM: 1 VIEW(S) XRAY OF THE CHEST 01/29/2024 08:01:00 AM COMPARISON: 01/25/2024 CLINICAL HISTORY: Pneumonia FINDINGS: LINES, TUBES AND DEVICES: Left chest cardiac pacing device. LUNGS AND PLEURA: Low lung volumes. Trace bilateral pleural effusions. Mild pulmonary edema with similar to mildly decreased patchy opacities in left mid to lower lung. No pneumothorax. HEART AND MEDIASTINUM: Post median sternotomy and CABG. Atherosclerotic calcifications. No acute abnormality of the cardiac and mediastinal silhouettes. BONES AND SOFT TISSUES: Post median sternotomy. No acute osseous abnormality. IMPRESSION: 1. Mild pulmonary edema with similar to mildly decreased patchy opacities in the left mid to lower lung, consistent  with pneumonia. 2. Trace bilateral pleural effusions. Electronically signed by: Waddell Calk MD 01/29/2024 08:14 AM EST RP Workstation: [id number]     The results of significant diagnostics from this hospitalization (including imaging, microbiology, ancillary and laboratory) are listed below for reference.     Microbiology: No results found for this or any previous visit (from the past 240 hours).   Labs: BNP (last 3 results) No results for input(s): BNP in the last 8760 hours. Basic Metabolic Panel: Recent Labs  Lab 02/22/24 1550 02/23/24 0408 02/24/24 0405 02/25/24 0346 02/26/24 0434  NA 139 139 139 138 138  K 4.0 4.3 4.4 4.6 4.8  CL 98 99 100 99 98  CO2 34* 32 32 33* 33*  GLUCOSE 119* 108* 96 106* 105*  BUN 37* 35* 37* 35* 34*  CREATININE 1.84* 1.65* 1.83* 1.73* 1.68*  CALCIUM  9.5 9.4 9.2 9.3 9.5  MG  --   --  2.2 2.5*  --    Liver Function Tests: Recent Labs  Lab 02/22/24 1644  AST 23  ALT 25  ALKPHOS 114  BILITOT 0.4  PROT 6.6  ALBUMIN   3.8   No results for input(s): LIPASE, AMYLASE in the last 168 hours. No results for input(s): AMMONIA in the last 168 hours. CBC: Recent Labs  Lab 02/22/24 1550 02/24/24 0405 02/25/24 0346  WBC 8.5 7.3 7.9  HGB 10.0* 8.7* 8.8*  HCT 32.4* 28.2* 28.7*  MCV 90.8 90.4 90.0  PLT 318 264 280   Cardiac Enzymes: No results for input(s): CKTOTAL, CKMB, CKMBINDEX, TROPONINI in the last 168 hours. BNP: Invalid input(s): POCBNP CBG: No results for input(s): GLUCAP in the last 168 hours. D-Dimer No results for input(s): DDIMER in the last 72 hours. Hgb A1c No results for input(s): HGBA1C in the last 72 hours. Lipid Profile No results for input(s): CHOL, HDL, LDLCALC, TRIG, CHOLHDL, LDLDIRECT in the last 72 hours. Thyroid function studies No results for input(s): TSH, T4TOTAL, T3FREE, THYROIDAB in the last 72 hours.  Invalid input(s): FREET3 Anemia work up No results for input(s): VITAMINB12, FOLATE, FERRITIN, TIBC, IRON, RETICCTPCT in the last 72 hours. Urinalysis    Component Value Date/Time   COLORURINE YELLOW 02/22/2024 1634   APPEARANCEUR CLEAR 02/22/2024 1634   LABSPEC 1.010 02/22/2024 1634   PHURINE 5.0 02/22/2024 1634   GLUCOSEU NEGATIVE 02/22/2024 1634   HGBUR NEGATIVE 02/22/2024 1634   BILIRUBINUR NEGATIVE 02/22/2024 1634   KETONESUR NEGATIVE 02/22/2024 1634   PROTEINUR NEGATIVE 02/22/2024 1634   NITRITE NEGATIVE 02/22/2024 1634   LEUKOCYTESUR NEGATIVE 02/22/2024 1634   Sepsis Labs Recent Labs  Lab 02/22/24 1550 02/24/24 0405 02/25/24 0346  WBC 8.5 7.3 7.9   Microbiology No results found for this or any previous visit (from the past 240 hours).   Time coordinating discharge:  I have spent 35 minutes face to face with the patient and on the ward discussing the patients care, assessment, plan and disposition with other care givers. >50% of the time was devoted counseling the patient about the risks and  benefits of treatment/Discharge disposition and coordinating care.   SIGNED:   Burgess [name] Dare, MD  [facility] 02/26/2024, 11:33 AM   If 7PM-7AM, please contact night-coverage      [1]  Allergies Allergen Reactions   Niaspan [Niacin] Hives, Itching and Other (See Comments)    NIASPAN ONLY

## 2024-02-26 NOTE — Evaluation (Signed)
 Physical Therapy Evaluation Patient Details Name: William Hatfield MRN: [id number] DOB: September 24, 1945 Today's Date: 02/26/2024  History of Present Illness  William Hatfield is a 78 y.o. male with medical history significant of atrial fibrillation on anticoagulation, coronary artery disease with history of NSTEMI, CKD, third-degree AV block, hypertension, peripheral vascular disease, recent left knee total arthroplasty.  Patient has been having difficulty with lower extremity swelling since his surgery.  He was admitted for and placed on antibiotics due to erythema and swelling in his lower extremities bilaterally.  In addition to the antibiotics, he has had ultrasound of his lower extremities which was negative for DVT.  His last ultrasound was 4 days ago.  He was sent to the hospital by his cardiology office due to elevated labs.  He was on Lasix  since his last admission about a month ago he has not had any improvement in his lower extremities swelling.  He was recently transition to torsemide , which he started Tuesday.  Labs earlier today show elevated proBNP (9577) as well as an elevated creatinine at 2.23.  Other than his significant swelling of his lower extremities bilaterally, he reports increasing shortness of breath, mild orthopnea he is currently having some difficulty walking due to his recent total knee arthroplasty, so it is difficult to say whether he has true dyspnea on exertion.  He denies chest pain, leg pain.  He has been compliant with his Eliquis .   Clinical Impression  Patient functioning at baseline for functional mobility and gait demonstrating good return for ambulating in room, hallways using his cane with mostly step-through pattern without loss of balance. Plan:  Patient discharged from physical therapy to care of nursing for ambulation daily as tolerated for length of stay.          If plan is discharge home, recommend the following: Help with stairs or ramp for entrance   Can travel  by private vehicle        Equipment Recommendations None recommended by PT  Recommendations for Other Services       Functional Status Assessment Patient has not had a recent decline in their functional status     Precautions / Restrictions Precautions Precautions: Fall Recall of Precautions/Restrictions: Intact Restrictions Weight Bearing Restrictions Per Provider Order: No      Mobility  Bed Mobility Overal bed mobility: Independent                  Transfers Overall transfer level: Modified independent Equipment used: Straight cane               General transfer comment: Use of hurrycane for transfers.    Ambulation/Gait Ambulation/Gait assistance: Modified independent (Device/Increase time) Gait Distance (Feet): 120 Feet Assistive device: Straight cane Gait Pattern/deviations: Step-through pattern, WFL(Within Functional Limits) Gait velocity: near normal     General Gait Details: grossly WFL with good return for ambulating in room, hallways using his cane with mostly step-through pattern,no loss of balance  Stairs            Wheelchair Mobility     Tilt Bed    Modified Rankin (Stroke Patients Only)       Balance Overall balance assessment: Needs assistance Sitting-balance support: Feet supported, No upper extremity supported Sitting balance-Leahy Scale: Good Sitting balance - Comments: seated at EOB   Standing balance support: During functional activity, Single extremity supported Standing balance-Leahy Scale: Good Standing balance comment: using SPC  Pertinent Vitals/Pain Pain Assessment Pain Assessment: No/denies pain    Home Living Family/patient expects to be discharged to:: Private residence Living Arrangements: Alone Available Help at Discharge: Family;Available 24 hours/day Type of Home: House Home Access: Level entry       Home Layout: One level Home Equipment: Rollator  (4 wheels);Cane - single Librarian, Academic (2 wheels);Shower seat Additional Comments: Pt lives near daughters; one daughter is an CHARITY FUN[employer]AIS[employer].    Prior Function Prior Level of Function : Independent/Modified Independent             Mobility Comments: Ambulates with hurrycane. Able to walk to mailbox. ADLs Comments: Independent; assist to cook and for groceries.     Extremity/Trunk Assessment   Upper Extremity Assessment Upper Extremity Assessment: Defer to OT evaluation LUE Deficits / Details: Baseline rotator cuff injury; limited to 50% of available AROM of shoulder flexion.    Lower Extremity Assessment Lower Extremity Assessment: Overall WFL for tasks assessed    Cervical / Trunk Assessment Cervical / Trunk Assessment: Kyphotic  Communication   Communication Communication: No apparent difficulties    Cognition Arousal: Alert Behavior During Therapy: WFL for tasks assessed/performed                             Following commands: Intact       Cueing Cueing Techniques: Verbal cues     General Comments      Exercises     Assessment/Plan    PT Assessment Patient does not need any further PT services  PT Problem List         PT Treatment Interventions      PT Goals (Current goals can be found in the Care Plan section)  Acute Rehab PT Goals Patient Stated Goal: return home with family to assist PT Goal Formulation: With patient Time For Goal Achievement: 02/26/24 Potential to Achieve Goals: Good    Frequency       Co-evaluation PT/OT/SLP Co-Evaluation/Treatment: Yes Reason for Co-Treatment: To address functional/ADL transfers PT goals addressed during session: Mobility/safety with mobility;Balance OT goals addressed during session: ADL's and self-care       AM-PAC PT 6 Clicks Mobility  Outcome Measure Help needed turning from your back to your side while in a flat bed without using bedrails?: None Help needed moving from lying on  your back to sitting on the side of a flat bed without using bedrails?: None Help needed moving to and from a bed to a chair (including a wheelchair)?: None Help needed standing up from a chair using your arms (e.g., wheelchair or bedside chair)?: None Help needed to walk in hospital room?: None Help needed climbing 3-5 steps with a railing? : A Little 6 Click Score: 23    End of Session   Activity Tolerance: Patient tolerated treatment well Patient left: in chair;with call bell/phone within reach Nurse Communication: Mobility status PT Visit Diagnosis: Unsteadiness on feet (R26.81);Other abnormalities of gait and mobility (R26.89);Muscle weakness (generalized) (M62.81)    Time: 9096-9087 PT Time Calculation (min) (ACUTE ONLY): 9 min   Charges:   PT Evaluation $PT Eval Low Complexity: 1 Low PT Treatments $Gait Training: 8-22 mins PT General Charges $$ ACUTE PT VISIT: 1 Visit         12:28 PM, 02/26/2024 Lynwood Music, MPT Physical Therapist with [facility] [phone] office [phone] mobile phone

## 2024-02-26 NOTE — Plan of Care (Signed)

## 2024-02-26 NOTE — Hospital Course (Addendum)
 Brief Narrative:   78 y.o. M with CAD status post CABG 2006 at [facility], [place] on Eliquis , HFpEF, history CHB with PPM 2021, HTN, carotid artery disease s/p right TCAR 2024, history of remote left CEA, CKD 3A, baseline 1.3-1.5, stage III sacral decub, and recent left knee TKA in Nov who presented with lower extremity swelling bilaterally since surgery.   Admitted to hospital late Nov for LE swelling, diagnosed with bilateral LE cellulitis, treated with antibiotics, ultimately diagnosed with HCAP, discharge to CIR.     Since discharge from rehab, he has had persistent lower extremity swelling.  Had ultrasound of lower extremities a few days prior to admission, then sent to hospital from cardiology office due to elevated creatinine and BNP.  Admitted here for CHF.     Initially started here on IV Lasix , then transition to torsemide  on 12/28, and seems to be improving.  Today patient feels great he wants to go home.  Assessment & Plan:     Congestive heart failure with acute exacerbation, preserved EF 55% Bilateral lower extremity swelling Echo last month showed EF 50 to 55% unremarkable valves.   -Patient admitted due to volume overload started on IV Lasix  initially and responded well.  Slowly transition to p.o. torsemide .    Leg swelling -From CHF exacerbation.  Recent DVT studies are negative     CKD IIIa Creatinine is around baseline of 1.7   Coronary artery disease Hypertension Peripheral vascular disease -Continue Eliquis , Lipitor, metoprolol    Sacral decubitus ulcer, stage III, present on admission - WOC   Paroxysmal atrial fibrillation Rate controlled - Continue metoprolol , apixaban    Hypertension Blood pressure controlled - Continue metoprolol .  IV as needed   Recent arthroplasty Did well with PT   Normocytic anemia Due to CKD, stable     DVT prophylaxis:Eliquis  Code Status: Full Family Communication: None at bedside Disposition Plan:  Status is: Inpatient    Discharge  Subjective: Feels great wanting to go home   Examination:  General exam: Appears calm and comfortable  Respiratory system: Clear to auscultation. Respiratory effort normal. Cardiovascular system: S1 & S2 heard, RRR. No JVD, murmurs, rubs, gallops or clicks. No pedal edema. Gastrointestinal system: Abdomen is nondistended, soft and nontender. No organomegaly or masses felt. Normal bowel sounds heard. Central nervous system: Alert and oriented. No focal neurological deficits. Extremities: Symmetric 5 x 5 power. Skin: No rashes, lesions or ulcers Psychiatry: Judgement and insight appear normal. Mood & affect appropriate.

## 2024-02-26 NOTE — Evaluation (Signed)
 Occupational Therapy Evaluation Patient Details Name: William Hatfield MRN: [id number] DOB: 01/30/1946 Today's Date: 02/26/2024   History of Present Illness   William Hatfield is a 78 y.o. male with medical history significant of atrial fibrillation on anticoagulation, coronary artery disease with history of NSTEMI, CKD, third-degree AV block, hypertension, peripheral vascular disease, recent left knee total arthroplasty.  Patient has been having difficulty with lower extremity swelling since his surgery.  He was admitted for and placed on antibiotics due to erythema and swelling in his lower extremities bilaterally.  In addition to the antibiotics, he has had ultrasound of his lower extremities which was negative for DVT.  His last ultrasound was 4 days ago.  He was sent to the hospital by his cardiology office due to elevated labs.  He was on Lasix  since his last admission about a month ago he has not had any improvement in his lower extremities swelling.  He was recently transition to torsemide , which he started Tuesday.  Labs earlier today show elevated proBNP (9577) as well as an elevated creatinine at 2.23.  Other than his significant swelling of his lower extremities bilaterally, he reports increasing shortness of breath, mild orthopnea he is currently having some difficulty walking due to his recent total knee arthroplasty, so it is difficult to say whether he has true dyspnea on exertion.  He denies chest pain, leg pain.  He has been compliant with his Eliquis . (per DO)     Clinical Impressions Pt agreeable to OT and PT co-evaluation. Pt at or near baseline function for ADL's and functional mobility. Pt required no physical assist with use of hurrycane for mobility around the room and in the hall. L UE baseline shoulder deficit. No physical assist needed for ADL's based on observation and clinical judgement. Pt left in the bed with call bell within reach. Pt is not recommended for any further acute OT  services and will be discharged to care of nursing staff for remaining length of stay.                 Functional Status Assessment   Patient has not had a recent decline in their functional status     Equipment Recommendations   None recommended by OT             Precautions/Restrictions   Precautions Precautions: Fall Recall of Precautions/Restrictions: Intact Restrictions Weight Bearing Restrictions Per Provider Order: No     Mobility Bed Mobility Overal bed mobility: Independent                  Transfers Overall transfer level: Modified independent Equipment used: Straight cane               General transfer comment: Use of hurrycane for transfers.      Balance Overall balance assessment: Mild deficits observed, not formally tested                                         ADL either performed or assessed with clinical judgement   ADL Overall ADL's : Modified independent                                       General ADL Comments: Mod I with use of cane per observation and clinical judgement.  Vision Baseline Vision/History: 1 Wears glasses Ability to See in Adequate Light: 0 Adequate Patient Visual Report: No change from baseline Vision Assessment?: No apparent visual deficits     Perception Perception: Not tested       Praxis Praxis: Not tested       Pertinent Vitals/Pain Pain Assessment Pain Assessment: No/denies pain     Extremity/Trunk Assessment Upper Extremity Assessment Upper Extremity Assessment: LUE deficits/detail LUE Deficits / Details: Baseline rotator cuff injury; limited to 50% of available AROM of shoulder flexion.   Lower Extremity Assessment Lower Extremity Assessment: Defer to PT evaluation   Cervical / Trunk Assessment Cervical / Trunk Assessment: Kyphotic   Communication Communication Communication: No apparent difficulties   Cognition Arousal:  Alert Behavior During Therapy: WFL for tasks assessed/performed Cognition: No apparent impairments                               Following commands: Intact       Cueing  General Comments   Cueing Techniques: Verbal cues                 Home Living Family/patient expects to be discharged to:: Private residence Living Arrangements: Alone Available Help at Discharge: Family;Available 24 hours/day Type of Home: House Home Access: Level entry     Home Layout: One level     Bathroom Shower/Tub: Chief Strategy Officer: Handicapped height Bathroom Accessibility: Yes How Accessible: Accessible via walker;Accessible via wheelchair Home Equipment: Rollator (4 wheels);Cane - single Librarian, Academic (2 wheels);Shower seat   Additional Comments: Pt lives near daughters; one daughter is an CHARITY [employer].      Prior Functioning/Environment Prior Level of Function : Independent/Modified Independent             Mobility Comments: Ambulates with hurrycane. Able to walk to mailbox. ADLs Comments: Independent; assist to cook and for groceries.                            Co-evaluation PT/OT/SLP Co-Evaluation/Treatment: Yes Reason for Co-Treatment: To address functional/ADL transfers   OT goals addressed during session: ADL's and self-care                       End of Session Equipment Utilized During Treatment: Other (comment) (hurrycane)  Activity Tolerance: Patient tolerated treatment well Patient left: in bed;with call bell/phone within reach  OT Visit Diagnosis: Unsteadiness on feet (R26.81);Other abnormalities of gait and mobility (R26.89)                Time: 9095-9086 OT Time Calculation (min): 9 min Charges:  OT General Charges $OT Visit: 1 Visit OT Evaluation $OT Eval Low Complexity: 1 Low  Akili Cuda OT, MOT  [name] Person 02/26/2024, 10:55 AM

## 2024-02-29 ENCOUNTER — Telehealth (HOSPITAL_COMMUNITY): Payer: Self-pay

## 2024-02-29 NOTE — Telephone Encounter (Signed)
 Called to confirm/remind patient of their appointment at the [facility] on 03/03/24 3:30.   Appointment:   [x] Confirmed  [] Left mess   [] No answer/No voice mail  [] VM Full/unable to leave message  [] Phone not in service  Patient reminded to bring all medications and/or complete list.  Confirmed patient has transportation. Gave directions, instructed to utilize valet parking.

## 2024-03-03 ENCOUNTER — Ambulatory Visit (HOSPITAL_COMMUNITY): Admit: 2024-03-03 | Discharge: 2024-03-03 | Disposition: A | Discharge status: Home / Self Care

## 2024-03-03 NOTE — Progress Notes (Incomplete)
 "    HEART & VASCULAR TRANSITION OF CARE CONSULT NOTE   Referring Physician: Dr. Caleen PCP: Henriette Anes, DO  Cardiologist: [name] Sierras, MD  HPI: Referred to clinic by Dr. Caleen for heart failure consultation.   William Hatfield is a 79 y.o. male with history of HFpEF, CABG in 2006 at [facility], paroxysmal Afib on eliquis , CHB s/p PPM, HTN, carotid artery disease s/p R TCAR 2024, remote history of L CEA, CKD 3b, stage 3 decubitus ulcer, and recent L knee TKA.   Patient underwent R TKA 01/16/24. Post operative course has been complicated by LE swelling. DVT study negative. He was admitted late November with HCAP and cellulitis s/p antibiotics and discharged to CIR.   He was seen in OP by his cardiology office 02/18/24 with persistent swelling with weeping and blisters. His lasix  was switched to torsemide , repeat DVT studies were ordered, and urgent referral to infectious disease was placed. Repeat labs the following week showed Cr 2.23 and pro BNP 9557. Admission was reccommended for CHF exacerbation.   Admitted 12/26-12/30 for IV diuresis. DVT study negative. He was continued on his GDMT. Rate controlled AF current regimen. He was discharged on home at a weight of 79.7 kg.    Today he presents for transition of care visit. Overall feeling ***. NYHA ***. Reports {Symptoms; cardiac:12860::dyspnea,fatigue}. Denies {Symptoms; cardiac:12860::chest pain,dyspnea,fatigue,near-syncope,orthopnea,palpitations,dizziness,abnormal bleeding}. Able to perform ADLs. Appetite okay. Weight at home ***. BP at home***. Compliant with all medications. Denies ETOH, tobacco, or drug use.    Cardiac Testing:    Past Medical History:  Diagnosis Date   Anginal pain    Arthritis    Atrial fibrillation (HCC)    a. post op in 2006  b. recurrence on 08/2016 admission for NSTEMI   CAD (coronary artery disease)    a. 2006: s/p CABG at [facility] with LIMA to LAD, SVG to PDA, SVG to OM1, SVG to D1  b. 08/2016:  NSTEMI 09/18/16 which showed severe native 3V CAD with patent SVG-->PLA, LIMA-->LAD and total occlusion of SVG--> OM1 with unsuccessful attempt at PCI of SVG--> OM with inability to restore flow.    Cancer (HCC)    Rt. arm,head squamus.   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   Essential hypertension    Myocardial infarction (HCC) 09/18/2016   Peripheral vascular disease    Pneumonia 2020   COVID PNA   Presence of permanent cardiac pacemaker    Tremor     Current Outpatient Medications  Medication Sig Dispense Refill   acetaminophen  (TYLENOL ) 500 MG tablet Take 500-1,000 mg by mouth every 6 (six) hours as needed for fever or mild pain (pain score 1-3) (headaches, or discomfort).     allopurinol  (ZYLOPRIM ) 100 MG tablet Take 1 tablet (100 mg total) by mouth in the morning. 30 tablet 0   apixaban  (ELIQUIS ) 5 MG TABS tablet Take 1 tablet (5 mg total) by mouth 2 (two) times daily. 60 tablet 0   atorvastatin  (LIPITOR) 40 MG tablet Take 1 tablet (40 mg total) by mouth every evening. 30 tablet 0   docusate sodium  (COLACE) 100 MG capsule Take 1 capsule (100 mg total) by mouth 2 (two) times daily. (Patient taking differently: Take 100 mg by mouth in the morning and at bedtime.) 30 capsule 0   metoprolol  tartrate (LOPRESSOR ) 25 MG tablet Take 1 tablet (25  mg total) by mouth 2 (two) times daily. 60 tablet 0   nitroGLYCERIN  (NITROSTAT ) 0.4 MG SL tablet DISSOLVE 1 TABLET UNDER THE TONGUE EVERY 5 MINUTES FOR 3 DOSES AS NEEDED FOR CHEST PAIN AS DIRECTED. 25 tablet 11   pantoprazole  (PROTONIX ) 40 MG tablet Take 1 tablet (40 mg total) by mouth 2 (two) times daily. 60 tablet 0   polyethylene glycol (MIRALAX  / GLYCOLAX ) 17 g packet Take 17 g by mouth daily. (Patient taking differently: Take 17 g by mouth daily as needed.) 14 each 0   torsemide  (DEMADEX ) 10 MG tablet Take 3 tablets (30 mg total) by mouth daily. 30  tablet 0   No current facility-administered medications for this visit.    Allergies[1]  Social History   Socioeconomic History   Marital status: Widowed    Spouse name: Not on file   Number of children: 2   Years of education: Not on file   Highest education level: Not on file  Occupational History   Not on file  Tobacco Use   Smoking status: Former    Current packs/day: 0.00    Average packs/day: 4.0 packs/day for 20.0 years (80.0 ttl pk-yrs)    Types: Cigarettes    Start date: 02/28/1972    Quit date: 02/28/1992    Years since quitting: 32.0   Smokeless tobacco: Former    Types: Snuff, Chew    Quit date: 02/27/1981  Vaping Use   Vaping status: Never Used  Substance and Sexual Activity   Alcohol use: No    Alcohol/week: 0.0 standard drinks of alcohol   Drug use: No   Sexual activity: Not Currently  Other Topics Concern   Not on file  Social History Narrative   Not on file   Social Drivers of Health   Tobacco Use: Medium Risk (02/22/2024)   Patient History    Smoking Tobacco Use: Former    Smokeless Tobacco Use: Former    Passive Exposure: Not on Stage Manager: Not on file  Food Insecurity: No Food Insecurity (02/22/2024)   Epic    Worried About Programme Researcher, Broadcasting/film/video in the Last Year: Never true    Ran Out of Food in the Last Year: Never true  Transportation Needs: No Transportation Needs (02/22/2024)   Epic    Lack of Transportation (Medical): No    Lack of Transportation (Non-Medical): No  Physical Activity: Not on file  Stress: Not on file  Social Connections: Moderately Isolated (02/22/2024)   Social Connection and Isolation Panel    Frequency of Communication with Friends and Family: More than three times a week    Frequency of Social Gatherings with Friends and Family: More than three times a week    Attends Religious Services: 1 to 4 times per year    Active Member of [employer] or Organizations: No    Attends Banker Meetings:  Never    Marital Status: Widowed  Intimate Partner Violence: Not At Risk (02/22/2024)   Epic    Fear of Current or Ex-Partner: No    Emotionally Abused: No    Physically Abused: No    Sexually Abused: No  Depression (PHQ2-9): Not on file  Alcohol Screen: Not on file  Housing: Low Risk (02/22/2024)   Epic    Unable to Pay for Housing in the Last Year: No    Number of Times Moved in the Last Year: 0    Homeless in the Last Year: No  Utilities:  Not At Risk (02/22/2024)   Epic    Threatened with loss of utilities: No  Health Literacy: Not on file    Family History  Problem Relation Age of Onset   Heart attack Father    Heart attack Paternal Grandfather     There were no vitals filed for this visit.  There were no vitals filed for this visit.  PHYSICAL EXAM: General: *** appearing. No distress  Cardiac: JVP ~*** cm. No murmurs  Resp: Lung sounds clear and equal B/L Abdomen: Soft, non-distended.  Extremities: Warm and dry.  *** edema.  Neuro: A&O x3. Affect pleasant.   ECG (personally reviewed):  ASSESSMENT & PLAN:  Chronic HFpEF, ICM - Echo 11/25: EF 50-55%, nl RV - Cath with severe 3v disease. Last cath 2018 patent SVG-PLA and LIMA- LAD, CTO of SVG-OM1. - NYHA ***. *** - continue torsemide  30 mg daily - GDMT: ? blocker: continue lopressor  25 mg bid ARB/ARNI:  MRA: SGLT2i:  Permanent Afib - rate controlled - on eliquis  5 mg bid  CAD  - s/p CABG in 2006 (LIMA-LAD, SVG-PDA, SVG-OM1, SVG-D1) - NSTEMI in 7/18: cath with severe native 3V CAD with patent SVG-PLA & LIMA-LAD and total occlusion of SVG-OM1 s/p unsuccessful PCI attempt    MR  - mild to mod on echo 11/25  CKD 3b - baseline Cr 1.4-1.6 -   Referred to HFSW (PCP, Medications, Transportation, ETOH Abuse, Drug Abuse, Insurance, Surveyor, Quantity ): {yes/no:20286} Refer to Pharmacy: {yes/no:20286} Refer to Home Health: {yes/no:20286} Refer to [facility]: {yes/no:20286}  Refer to General  Cardiology: No  Follow up with ***  Juanna Pudlo, NP 03/03/2024     [1]  Allergies Allergen Reactions   Niaspan [Niacin] Hives, Itching and Other (See Comments)    [facility] ONLY   "

## 2024-03-04 ENCOUNTER — Encounter: Payer: Self-pay | Admitting: *Deleted

## 2024-03-05 ENCOUNTER — Encounter: Payer: Self-pay | Admitting: Family Medicine

## 2024-03-05 ENCOUNTER — Ambulatory Visit: Admitting: Student

## 2024-03-05 ENCOUNTER — Ambulatory Visit: Payer: Self-pay | Admitting: Internal Medicine

## 2024-03-06 MED ORDER — TORSEMIDE 10 MG PO TABS: 30.0000 mg | ORAL_TABLET | Freq: Every day | ORAL | 3 refills | Status: DC

## 2024-03-12 ENCOUNTER — Encounter: Payer: Self-pay | Admitting: Cardiology

## 2024-03-12 ENCOUNTER — Ambulatory Visit: Attending: Cardiology | Admitting: Cardiology

## 2024-03-12 VITALS — BP 110/62 | HR 80 | Ht 71.0 in | Wt 188.6 lb

## 2024-03-12 DIAGNOSIS — I480 Paroxysmal atrial fibrillation: Secondary | ICD-10-CM | POA: Diagnosis not present

## 2024-03-12 DIAGNOSIS — E782 Mixed hyperlipidemia: Secondary | ICD-10-CM | POA: Insufficient documentation

## 2024-03-12 DIAGNOSIS — Z950 Presence of cardiac pacemaker: Secondary | ICD-10-CM | POA: Insufficient documentation

## 2024-03-12 DIAGNOSIS — I25119 Atherosclerotic heart disease of native coronary artery with unspecified angina pectoris: Secondary | ICD-10-CM | POA: Insufficient documentation

## 2024-03-12 DIAGNOSIS — I5032 Chronic diastolic (congestive) heart failure: Secondary | ICD-10-CM | POA: Diagnosis present

## 2024-03-12 DIAGNOSIS — Z79899 Other long term (current) drug therapy: Secondary | ICD-10-CM | POA: Diagnosis not present

## 2024-03-12 MED ORDER — TORSEMIDE 20 MG PO TABS: 20.0000 mg | ORAL_TABLET | Freq: Two times a day (BID) | ORAL | 3 refills | Status: AC

## 2024-03-12 NOTE — Patient Instructions (Signed)
 Medication Instructions:   Take Torsemide  20 mg Twice a day  Labwork: BMET in 10 days  Testing/Procedures: None today  Follow-Up: 2 months with Dr.McDowell or B.Strader,PA-C or S.Dunlap,PA-C  Any Other Special Instructions Will Be Listed Below (If Applicable).  If you need a refill on your cardiac medications before your next appointment, please call your pharmacy.

## 2024-03-12 NOTE — Progress Notes (Signed)
 "    Cardiology Office Note  Date: 03/12/2024   ID: William Hatfield, DOB [birth year], MRN [id number]  History of Present Illness: William Hatfield is a 79 y.o. male last seen in December 2025 by Ms. Miriam NP, I reviewed her note and records from hospitalization.  He is here for a follow-up visit, states that he feels much better.  I do note that his weight has increased however since December and he continues to have leg edema although reportedly better than last month.  He has PT/OT at home twice a week, states that he is gaining strength.  Otherwise reports no angina, no palpitations or syncope.  We went over his medications and discussed uptitration of Demadex .  He had been taking Demadex  20 mg daily instead of 30 mg daily since hospital discharge.  I reviewed his lab work and ECG from December 2025, also follow-up echocardiogram from November 2025.  Physical Exam: VS:  BP 110/62 (BP Location: Right Arm, Patient Position: Sitting, Cuff Size: Normal)   Pulse 80   Ht 5' 11 (1.803 m)   Wt 188 lb 9.6 oz (85.5 kg)   SpO2 97%   BMI 26.30 kg/m , BMI Body mass index is 26.3 kg/m.  Wt Readings from Last 3 Encounters:  03/12/24 188 lb 9.6 oz (85.5 kg)  02/26/24 175 lb 11.3 oz (79.7 kg)  02/18/24 178 lb (80.7 kg)    General: Patient appears comfortable at rest. HEENT: Conjunctiva and lids normal. Neck: Supple, no elevated JVP or carotid bruits. Lungs: Clear to auscultation, nonlabored breathing at rest. Cardiac: Somewhat irregular, 1/6 systolic murmur, no gallop. Extremities: 2+ firm lower leg edema  ECG:  An ECG dated 02/22/2024 was personally reviewed today and demonstrated:  Dual-chamber and ventricular pacing evident.  Labwork: 11/22/2023: TSH 0.960 01/29/2024: Pro Brain Natriuretic Peptide 16,192.0 02/22/2024: ALT 25; AST 23 02/25/2024: Hemoglobin 8.8; Magnesium  2.5; Platelets 280 02/26/2024: BUN 34; Creatinine, Ser 1.68; Potassium 4.8; Sodium 138     Component Value Date/Time   CHOL 71  01/25/2024 0326   TRIG 60 01/25/2024 0326   HDL 28 (L) 01/25/2024 0326   CHOLHDL 2.6 01/25/2024 0326   VLDL 12 01/25/2024 0326   LDLCALC 31 01/25/2024 0326   Other Studies Reviewed Today:  Echocardiogram 01/24/2024:  1. Left ventricular ejection fraction, by estimation, is 50 to 55%. The  left ventricle has low normal function. The left ventricle has no regional  wall motion abnormalities. There is severe concentric left ventricular  hypertrophy. Left ventricular  diastolic parameters are indeterminate.   2. Right ventricular systolic function is normal. The right ventricular  size is normal.   3. The mitral valve is normal in structure. Mild to moderate mitral valve  regurgitation. No evidence of mitral stenosis.   4. The aortic valve has an indeterminant number of cusps. Aortic valve  regurgitation is not visualized. No aortic stenosis is present.   Assessment and Plan:  1.  Multivessel CAD status post CABG at [facility] in 2006 with LIMA to LAD, SVG to PDA, SVG to OM1, and SVG to D1.  He has subsequently documented graft disease as of 2018 with occluded SVG to OM managed medically.  Reports no angina at this time, not on aspirin  given use of Eliquis .  Continue statin therapy and as needed nitroglycerin .  2.  Fluid retention, likely component of HFpEF, LVEF 50 to 55% with normal RV contraction by echocardiogram in November 2025.  Increase Demadex  to 20 mg twice daily, follow-up BMET in 10  days and determine if standing potassium supplement also needed.  Not an optimal candidate for SGLT2 inhibitor with history of sacral decubitus and infection risk.   3.  Mixed hyperlipidemia.  LDL 31 in November 2025.  Continue Lipitor 40 mg daily.   4.  Paroxysmal atrial fibrillation with CHA2DS2-VASc score of 3.  No increasing palpitations.  Continue Lopressor  25 mg twice daily and Eliquis  5 mg twice daily.   5.  Bilateral carotid artery disease, now status post TCAR of RICA in August 2024, underwent  prior left CEA.  Carotid Dopplers in June 2025 indicated patent RICA stent site and 1 to 39% LICA stenosis.  He continues to follow with [facility].   6.  Primary hypertension.  Blood pressure is normal today.  Disposition:  Follow up 2 months.  Signed, [name] JUDITHANN Sierras, M.D., F.A.C.C. [facility] "

## 2024-03-19 ENCOUNTER — Other Ambulatory Visit: Payer: Self-pay | Admitting: Cardiology

## 2024-03-31 ENCOUNTER — Ambulatory Visit: Payer: Self-pay | Admitting: Cardiology

## 2024-03-31 MED ORDER — POTASSIUM CHLORIDE CRYS ER 10 MEQ PO TBCR: 10.0000 meq | EXTENDED_RELEASE_TABLET | Freq: Every day | ORAL | 3 refills | Status: AC

## 2024-03-31 NOTE — Telephone Encounter (Signed)
 The daughter,Angel,  has been notified of the result and verbalized understanding.  All questions (if any) were answered. Bernett Dorothyann LABOR, RN 03/31/2024 4:55 PM

## 2024-03-31 NOTE — Telephone Encounter (Signed)
-----   Message from [name] Sierras, MD sent at 03/31/2024  4:47 PM EST ----- Results reviewed. Creatinine 2.0 and potassium 3.9. Let's add KCL 10 mEq daily to current diuretic regimen.

## 2024-04-07 ENCOUNTER — Ambulatory Visit

## 2024-05-07 ENCOUNTER — Ambulatory Visit: Admitting: Cardiology

## 2024-06-06 ENCOUNTER — Ambulatory Visit: Admitting: Student

## 2024-07-07 ENCOUNTER — Ambulatory Visit

## 2024-10-06 ENCOUNTER — Ambulatory Visit

## 2025-01-05 ENCOUNTER — Ambulatory Visit

## 2025-04-06 ENCOUNTER — Ambulatory Visit
